# Patient Record
Sex: Female | Born: 1945
Health system: Southern US, Community
[De-identification: ages and names within clinical notes are randomized; demographics above are authoritative.]

## PROBLEM LIST (undated history)

## (undated) DIAGNOSIS — M810 Age-related osteoporosis without current pathological fracture: Secondary | ICD-10-CM

## (undated) DIAGNOSIS — R413 Other amnesia: Secondary | ICD-10-CM

## (undated) DIAGNOSIS — M8580 Other specified disorders of bone density and structure, unspecified site: Secondary | ICD-10-CM

## (undated) DIAGNOSIS — E039 Hypothyroidism, unspecified: Secondary | ICD-10-CM

## (undated) DIAGNOSIS — H10409 Unspecified chronic conjunctivitis, unspecified eye: Secondary | ICD-10-CM

## (undated) DIAGNOSIS — E785 Hyperlipidemia, unspecified: Secondary | ICD-10-CM

## (undated) DIAGNOSIS — G500 Trigeminal neuralgia: Secondary | ICD-10-CM

## (undated) DIAGNOSIS — K222 Esophageal obstruction: Secondary | ICD-10-CM

## (undated) DIAGNOSIS — L814 Other melanin hyperpigmentation: Secondary | ICD-10-CM

## (undated) DIAGNOSIS — K5730 Diverticulosis of large intestine without perforation or abscess without bleeding: Secondary | ICD-10-CM

## (undated) DIAGNOSIS — C439 Malignant melanoma of skin, unspecified: Secondary | ICD-10-CM

## (undated) DIAGNOSIS — M47812 Spondylosis without myelopathy or radiculopathy, cervical region: Secondary | ICD-10-CM

## (undated) DIAGNOSIS — H269 Unspecified cataract: Secondary | ICD-10-CM

## (undated) DIAGNOSIS — F419 Anxiety disorder, unspecified: Secondary | ICD-10-CM

## (undated) DIAGNOSIS — K219 Gastro-esophageal reflux disease without esophagitis: Secondary | ICD-10-CM

## (undated) DIAGNOSIS — H9190 Unspecified hearing loss, unspecified ear: Secondary | ICD-10-CM

## (undated) DIAGNOSIS — E7800 Pure hypercholesterolemia, unspecified: Secondary | ICD-10-CM

## (undated) DIAGNOSIS — Z8619 Personal history of other infectious and parasitic diseases: Secondary | ICD-10-CM

## (undated) HISTORY — DX: Trigeminal neuralgia: G50.0

## (undated) HISTORY — DX: Spondylosis without myelopathy or radiculopathy, cervical region: M47.812

## (undated) HISTORY — PX: UPPER GASTROINTESTINAL ENDOSCOPY: SHX188

## (undated) HISTORY — DX: Unspecified chronic conjunctivitis, unspecified eye: H10.409

## (undated) HISTORY — DX: Personal history of other infectious and parasitic diseases: Z86.19

## (undated) HISTORY — DX: Age-related osteoporosis without current pathological fracture: M81.0

## (undated) HISTORY — DX: Esophageal obstruction: K22.2

## (undated) HISTORY — DX: Hypothyroidism, unspecified: E03.9

## (undated) HISTORY — PX: COLONOSCOPY: SHX174

## (undated) HISTORY — DX: Other specified disorders of bone density and structure, unspecified site: M85.80

## (undated) HISTORY — DX: Unspecified hearing loss, unspecified ear: H91.90

## (undated) HISTORY — DX: Hyperlipidemia, unspecified: E78.5

## (undated) HISTORY — PX: CATARACT EXTRACTION: SUR2

## (undated) HISTORY — DX: Pure hypercholesterolemia, unspecified: E78.00

## (undated) HISTORY — PX: CHOLECYSTECTOMY: SHX55

## (undated) HISTORY — DX: Unspecified cataract: H26.9

## (undated) HISTORY — PX: ABDOMINAL HYSTERECTOMY: SHX81

## (undated) HISTORY — DX: Other amnesia: R41.3

## (undated) HISTORY — DX: Gastro-esophageal reflux disease without esophagitis: K21.9

## (undated) HISTORY — DX: Malignant melanoma of skin, unspecified: C43.9

## (undated) HISTORY — DX: Anxiety disorder, unspecified: F41.9

## (undated) HISTORY — PX: OTHER SURGICAL HISTORY: SHX169

## (undated) HISTORY — PX: WRIST SURGERY: SHX841

## (undated) HISTORY — DX: Other melanin hyperpigmentation: L81.4

## (undated) HISTORY — PX: COLON SURGERY: SHX602

## (undated) HISTORY — PX: LUMBAR DISC SURGERY: SHX700

## (undated) HISTORY — DX: Diverticulosis of large intestine without perforation or abscess without bleeding: K57.30

## (undated) HISTORY — PX: SHOULDER SURGERY: SHX246

---

## 1986-01-19 HISTORY — PX: BREAST EXCISIONAL BIOPSY: SUR124

## 1986-01-19 HISTORY — PX: BREAST BIOPSY: SHX20

## 1997-01-19 HISTORY — PX: COLON RESECTION: SHX5231

## 2002-10-05 ENCOUNTER — Other Ambulatory Visit: Admission: RE | Admit: 2002-10-05 | Discharge: 2002-10-05 | Payer: Self-pay | Admitting: Obstetrics and Gynecology

## 2002-10-19 ENCOUNTER — Ambulatory Visit (HOSPITAL_COMMUNITY): Admission: RE | Admit: 2002-10-19 | Discharge: 2002-10-19 | Payer: Self-pay | Admitting: Family Medicine

## 2002-10-19 ENCOUNTER — Encounter: Payer: Self-pay | Admitting: Family Medicine

## 2003-04-14 ENCOUNTER — Emergency Department (HOSPITAL_COMMUNITY): Admission: EM | Admit: 2003-04-14 | Discharge: 2003-04-14 | Payer: Self-pay | Admitting: *Deleted

## 2003-04-17 ENCOUNTER — Ambulatory Visit (HOSPITAL_COMMUNITY): Admission: RE | Admit: 2003-04-17 | Discharge: 2003-04-17 | Payer: Self-pay | Admitting: Family Medicine

## 2003-04-23 ENCOUNTER — Ambulatory Visit (HOSPITAL_COMMUNITY): Admission: RE | Admit: 2003-04-23 | Discharge: 2003-04-23 | Payer: Self-pay | Admitting: Orthopaedic Surgery

## 2003-04-26 ENCOUNTER — Emergency Department (HOSPITAL_COMMUNITY): Admission: EM | Admit: 2003-04-26 | Discharge: 2003-04-26 | Payer: Self-pay | Admitting: Emergency Medicine

## 2003-05-10 ENCOUNTER — Ambulatory Visit (HOSPITAL_COMMUNITY): Admission: RE | Admit: 2003-05-10 | Discharge: 2003-05-10 | Payer: Self-pay | Admitting: Gastroenterology

## 2003-05-16 ENCOUNTER — Ambulatory Visit (HOSPITAL_COMMUNITY): Admission: RE | Admit: 2003-05-16 | Discharge: 2003-05-16 | Payer: Self-pay | Admitting: Gastroenterology

## 2003-05-18 ENCOUNTER — Ambulatory Visit (HOSPITAL_COMMUNITY): Admission: RE | Admit: 2003-05-18 | Discharge: 2003-05-18 | Payer: Self-pay | Admitting: Gastroenterology

## 2003-06-07 ENCOUNTER — Ambulatory Visit (HOSPITAL_COMMUNITY): Admission: RE | Admit: 2003-06-07 | Discharge: 2003-06-07 | Payer: Self-pay | Admitting: Orthopaedic Surgery

## 2003-06-13 ENCOUNTER — Ambulatory Visit (HOSPITAL_COMMUNITY): Admission: RE | Admit: 2003-06-13 | Discharge: 2003-06-13 | Payer: Self-pay | Admitting: Orthopaedic Surgery

## 2003-07-18 ENCOUNTER — Ambulatory Visit (HOSPITAL_COMMUNITY): Admission: RE | Admit: 2003-07-18 | Discharge: 2003-07-18 | Payer: Self-pay | Admitting: Orthopaedic Surgery

## 2003-07-24 ENCOUNTER — Inpatient Hospital Stay (HOSPITAL_COMMUNITY): Admission: RE | Admit: 2003-07-24 | Discharge: 2003-07-27 | Payer: Self-pay | Admitting: Orthopaedic Surgery

## 2003-12-12 ENCOUNTER — Other Ambulatory Visit: Admission: RE | Admit: 2003-12-12 | Discharge: 2003-12-12 | Payer: Self-pay | Admitting: Obstetrics and Gynecology

## 2003-12-21 ENCOUNTER — Ambulatory Visit: Payer: Self-pay | Admitting: Family Medicine

## 2004-01-18 ENCOUNTER — Ambulatory Visit (HOSPITAL_COMMUNITY): Admission: RE | Admit: 2004-01-18 | Discharge: 2004-01-18 | Payer: Self-pay | Admitting: Obstetrics and Gynecology

## 2004-02-12 ENCOUNTER — Ambulatory Visit: Payer: Self-pay | Admitting: Family Medicine

## 2004-02-12 ENCOUNTER — Ambulatory Visit (HOSPITAL_COMMUNITY): Admission: RE | Admit: 2004-02-12 | Discharge: 2004-02-12 | Payer: Self-pay | Admitting: Family Medicine

## 2004-03-31 ENCOUNTER — Ambulatory Visit: Payer: Self-pay | Admitting: Family Medicine

## 2004-09-23 ENCOUNTER — Ambulatory Visit: Payer: Self-pay | Admitting: Internal Medicine

## 2005-02-05 ENCOUNTER — Ambulatory Visit: Payer: Self-pay | Admitting: Gastroenterology

## 2005-02-16 ENCOUNTER — Ambulatory Visit (HOSPITAL_COMMUNITY): Admission: RE | Admit: 2005-02-16 | Discharge: 2005-02-16 | Payer: Self-pay | Admitting: Gastroenterology

## 2005-02-16 ENCOUNTER — Ambulatory Visit: Payer: Self-pay | Admitting: Gastroenterology

## 2005-02-16 DIAGNOSIS — K222 Esophageal obstruction: Secondary | ICD-10-CM | POA: Insufficient documentation

## 2005-02-20 ENCOUNTER — Other Ambulatory Visit: Admission: RE | Admit: 2005-02-20 | Discharge: 2005-02-20 | Payer: Self-pay | Admitting: Obstetrics and Gynecology

## 2005-03-09 ENCOUNTER — Ambulatory Visit (HOSPITAL_COMMUNITY): Admission: RE | Admit: 2005-03-09 | Discharge: 2005-03-09 | Payer: Self-pay | Admitting: Obstetrics and Gynecology

## 2005-04-08 ENCOUNTER — Ambulatory Visit: Payer: Self-pay | Admitting: Gastroenterology

## 2005-04-13 ENCOUNTER — Ambulatory Visit: Payer: Self-pay | Admitting: Internal Medicine

## 2005-04-14 ENCOUNTER — Ambulatory Visit: Payer: Self-pay | Admitting: Internal Medicine

## 2005-04-17 ENCOUNTER — Ambulatory Visit (HOSPITAL_COMMUNITY): Admission: RE | Admit: 2005-04-17 | Discharge: 2005-04-17 | Payer: Self-pay | Admitting: Internal Medicine

## 2005-04-17 ENCOUNTER — Ambulatory Visit: Payer: Self-pay | Admitting: Internal Medicine

## 2005-04-28 ENCOUNTER — Encounter: Payer: Self-pay | Admitting: Internal Medicine

## 2005-04-28 ENCOUNTER — Ambulatory Visit: Payer: Self-pay

## 2005-05-13 ENCOUNTER — Ambulatory Visit: Payer: Self-pay | Admitting: Internal Medicine

## 2005-05-28 ENCOUNTER — Ambulatory Visit: Payer: Self-pay | Admitting: Gastroenterology

## 2005-06-01 ENCOUNTER — Ambulatory Visit (HOSPITAL_COMMUNITY): Admission: RE | Admit: 2005-06-01 | Discharge: 2005-06-01 | Payer: Self-pay | Admitting: Gastroenterology

## 2005-07-07 ENCOUNTER — Ambulatory Visit: Payer: Self-pay | Admitting: Gastroenterology

## 2005-07-13 ENCOUNTER — Ambulatory Visit: Payer: Self-pay | Admitting: Internal Medicine

## 2005-09-24 ENCOUNTER — Ambulatory Visit: Payer: Self-pay | Admitting: Endocrinology

## 2005-11-10 ENCOUNTER — Ambulatory Visit: Payer: Self-pay | Admitting: Internal Medicine

## 2005-11-11 ENCOUNTER — Ambulatory Visit: Payer: Self-pay | Admitting: Internal Medicine

## 2005-11-11 LAB — CONVERTED CEMR LAB
ALT: 15 units/L (ref 0–40)
AST: 16 units/L (ref 0–37)
BUN: 26 mg/dL — ABNORMAL HIGH (ref 6–23)
CO2: 31 meq/L (ref 19–32)
Calcium: 9.6 mg/dL (ref 8.4–10.5)
Chloride: 107 meq/L (ref 96–112)
Chol/HDL Ratio, serum: 2.7
Cholesterol: 159 mg/dL (ref 0–200)
Creatinine, Ser: 1 mg/dL (ref 0.4–1.2)
Creatinine,U: 75.1 mg/dL
GFR calc non Af Amer: 60 mL/min
Glomerular Filtration Rate, Af Am: 73 mL/min/{1.73_m2}
Glucose, Bld: 126 mg/dL — ABNORMAL HIGH (ref 70–99)
HCT: 43.1 % (ref 36.0–46.0)
HDL: 59.3 mg/dL (ref >39.0)
Hemoglobin: 13.8 g/dL (ref 12.0–15.0)
Hgb A1c MFr Bld: 6.3 % — ABNORMAL HIGH (ref 4.6–6.0)
LDL Cholesterol: 92 mg/dL (ref 0–99)
MCHC: 32.1 g/dL (ref 30.0–36.0)
MCV: 95.2 fL (ref 78.0–100.0)
Microalb Creat Ratio: 2.7 mg/g (ref 0.0–30.0)
Microalb, Ur: 0.2 mg/dL (ref 0.0–1.9)
Platelets: 201 10*3/uL (ref 150–400)
Potassium: 5 meq/L (ref 3.5–5.1)
RBC: 4.52 M/uL (ref 3.87–5.11)
RDW: 12.4 % (ref 11.5–14.6)
Sodium: 141 meq/L (ref 135–145)
Triglyceride fasting, serum: 37 mg/dL (ref 0–149)
VLDL: 7 mg/dL (ref 0–40)
WBC: 5.1 10*3/uL (ref 4.5–10.5)

## 2005-11-17 ENCOUNTER — Ambulatory Visit (HOSPITAL_COMMUNITY): Admission: RE | Admit: 2005-11-17 | Discharge: 2005-11-17 | Payer: Self-pay | Admitting: Internal Medicine

## 2005-11-30 ENCOUNTER — Ambulatory Visit: Payer: Self-pay | Admitting: Internal Medicine

## 2006-01-08 ENCOUNTER — Ambulatory Visit: Payer: Self-pay | Admitting: Internal Medicine

## 2006-01-29 ENCOUNTER — Ambulatory Visit: Payer: Self-pay | Admitting: Internal Medicine

## 2006-01-29 LAB — CONVERTED CEMR LAB
BUN: 22 mg/dL (ref 6–23)
Creatinine, Ser: 0.8 mg/dL (ref 0.4–1.2)

## 2006-02-04 ENCOUNTER — Ambulatory Visit (HOSPITAL_COMMUNITY): Admission: RE | Admit: 2006-02-04 | Discharge: 2006-02-04 | Payer: Self-pay | Admitting: Internal Medicine

## 2006-02-27 ENCOUNTER — Emergency Department (HOSPITAL_COMMUNITY): Admission: EM | Admit: 2006-02-27 | Discharge: 2006-02-27 | Payer: Self-pay | Admitting: Emergency Medicine

## 2006-03-31 ENCOUNTER — Ambulatory Visit: Payer: Self-pay | Admitting: Internal Medicine

## 2006-03-31 LAB — CONVERTED CEMR LAB
ALT: 15 units/L (ref 0–40)
AST: 18 units/L (ref 0–37)
Albumin: 3.7 g/dL (ref 3.5–5.2)
Alkaline Phosphatase: 73 units/L (ref 39–117)
Amylase: 56 units/L (ref 27–131)
Basophils Absolute: 0 10*3/uL (ref 0.0–0.1)
Basophils Relative: 0.2 % (ref 0.0–1.0)
Bilirubin, Direct: 0.1 mg/dL (ref 0.0–0.3)
Eosinophils Absolute: 0 10*3/uL (ref 0.0–0.6)
Eosinophils Relative: 0.6 % (ref 0.0–5.0)
HCT: 41.5 % (ref 36.0–46.0)
Hemoglobin: 14.5 g/dL (ref 12.0–15.0)
Hgb A1c MFr Bld: 6.2 % — ABNORMAL HIGH (ref 4.6–6.0)
Lipase: 31 units/L (ref 11.0–59.0)
Lymphocytes Relative: 29.7 % (ref 12.0–46.0)
MCHC: 34.8 g/dL (ref 30.0–36.0)
MCV: 92.3 fL (ref 78.0–100.0)
Monocytes Absolute: 0.5 10*3/uL (ref 0.2–0.7)
Monocytes Relative: 10.8 % (ref 3.0–11.0)
Neutro Abs: 2.9 10*3/uL (ref 1.4–7.7)
Neutrophils Relative %: 58.7 % (ref 43.0–77.0)
Platelets: 203 10*3/uL (ref 150–400)
RBC: 4.49 M/uL (ref 3.87–5.11)
RDW: 11.8 % (ref 11.5–14.6)
Total Bilirubin: 0.7 mg/dL (ref 0.3–1.2)
Total Protein: 7.1 g/dL (ref 6.0–8.3)
WBC: 4.9 10*3/uL (ref 4.5–10.5)

## 2006-04-19 ENCOUNTER — Ambulatory Visit: Payer: Self-pay | Admitting: Gastroenterology

## 2006-04-22 ENCOUNTER — Ambulatory Visit (HOSPITAL_COMMUNITY): Admission: RE | Admit: 2006-04-22 | Discharge: 2006-04-22 | Payer: Self-pay | Admitting: Obstetrics and Gynecology

## 2006-04-27 ENCOUNTER — Ambulatory Visit (HOSPITAL_COMMUNITY): Admission: RE | Admit: 2006-04-27 | Discharge: 2006-04-27 | Payer: Self-pay | Admitting: Gastroenterology

## 2006-05-10 DIAGNOSIS — Z85820 Personal history of malignant melanoma of skin: Secondary | ICD-10-CM | POA: Insufficient documentation

## 2006-05-10 DIAGNOSIS — M8580 Other specified disorders of bone density and structure, unspecified site: Secondary | ICD-10-CM | POA: Insufficient documentation

## 2006-05-10 DIAGNOSIS — C439 Malignant melanoma of skin, unspecified: Secondary | ICD-10-CM | POA: Insufficient documentation

## 2006-05-10 DIAGNOSIS — E785 Hyperlipidemia, unspecified: Secondary | ICD-10-CM | POA: Insufficient documentation

## 2006-05-10 DIAGNOSIS — K5732 Diverticulitis of large intestine without perforation or abscess without bleeding: Secondary | ICD-10-CM | POA: Insufficient documentation

## 2006-05-10 DIAGNOSIS — E1169 Type 2 diabetes mellitus with other specified complication: Secondary | ICD-10-CM | POA: Insufficient documentation

## 2006-06-01 ENCOUNTER — Ambulatory Visit: Payer: Self-pay | Admitting: Gastroenterology

## 2006-06-11 ENCOUNTER — Ambulatory Visit: Payer: Self-pay | Admitting: Gastroenterology

## 2006-06-11 LAB — CONVERTED CEMR LAB
ALT: 16 units/L (ref 0–40)
AST: 18 units/L (ref 0–37)
Albumin: 4.1 g/dL (ref 3.5–5.2)
Alkaline Phosphatase: 84 units/L (ref 39–117)
Ammonia: 7 umol/L — ABNORMAL LOW (ref 11–35)
Basophils Absolute: 0 10*3/uL (ref 0.0–0.1)
Basophils Relative: 0.4 % (ref 0.0–1.0)
Bilirubin, Direct: 0.1 mg/dL (ref 0.0–0.3)
Cholesterol: 241 mg/dL (ref 0–200)
Direct LDL: 148.1 mg/dL
Eosinophils Absolute: 0 10*3/uL (ref 0.0–0.6)
Eosinophils Relative: 0.5 % (ref 0.0–5.0)
HCT: 41.6 % (ref 36.0–46.0)
HDL: 63 mg/dL (ref >39.0)
Hemoglobin: 14.1 g/dL (ref 12.0–15.0)
Lymphocytes Relative: 22 % (ref 12.0–46.0)
MCHC: 33.9 g/dL (ref 30.0–36.0)
MCV: 90.9 fL (ref 78.0–100.0)
Monocytes Absolute: 0.7 10*3/uL (ref 0.2–0.7)
Monocytes Relative: 9.8 % (ref 3.0–11.0)
Neutro Abs: 4.8 10*3/uL (ref 1.4–7.7)
Neutrophils Relative %: 67.3 % (ref 43.0–77.0)
Platelets: 255 10*3/uL (ref 150–400)
RBC: 4.57 M/uL (ref 3.87–5.11)
RDW: 11.2 % — ABNORMAL LOW (ref 11.5–14.6)
Total Bilirubin: 0.7 mg/dL (ref 0.3–1.2)
Total CHOL/HDL Ratio: 3.8
Total Protein: 7.6 g/dL (ref 6.0–8.3)
Triglycerides: 113 mg/dL (ref 0–149)
VLDL: 23 mg/dL (ref 0–40)
WBC: 7 10*3/uL (ref 4.5–10.5)

## 2006-06-24 ENCOUNTER — Ambulatory Visit: Payer: Self-pay | Admitting: Gastroenterology

## 2006-10-23 ENCOUNTER — Encounter: Payer: Self-pay | Admitting: *Deleted

## 2006-10-23 DIAGNOSIS — K219 Gastro-esophageal reflux disease without esophagitis: Secondary | ICD-10-CM | POA: Insufficient documentation

## 2006-11-08 ENCOUNTER — Ambulatory Visit: Payer: Self-pay | Admitting: Gastroenterology

## 2006-11-08 LAB — CONVERTED CEMR LAB
BUN: 26 mg/dL — ABNORMAL HIGH (ref 6–23)
Creatinine, Ser: 0.9 mg/dL (ref 0.4–1.2)

## 2006-11-12 ENCOUNTER — Ambulatory Visit (HOSPITAL_COMMUNITY): Admission: RE | Admit: 2006-11-12 | Discharge: 2006-11-12 | Payer: Self-pay | Admitting: Gastroenterology

## 2007-02-14 ENCOUNTER — Ambulatory Visit: Payer: Self-pay | Admitting: Internal Medicine

## 2007-02-14 DIAGNOSIS — F411 Generalized anxiety disorder: Secondary | ICD-10-CM | POA: Insufficient documentation

## 2007-02-14 DIAGNOSIS — E1142 Type 2 diabetes mellitus with diabetic polyneuropathy: Secondary | ICD-10-CM | POA: Insufficient documentation

## 2007-02-15 LAB — CONVERTED CEMR LAB
ALT: 20 units/L (ref 0–35)
AST: 20 units/L (ref 0–37)
BUN: 21 mg/dL (ref 6–23)
CO2: 31 meq/L (ref 19–32)
Calcium: 9.7 mg/dL (ref 8.4–10.5)
Chloride: 104 meq/L (ref 96–112)
Cholesterol: 174 mg/dL (ref 0–200)
Creatinine, Ser: 0.8 mg/dL (ref 0.4–1.2)
GFR calc Af Amer: 94 mL/min
GFR calc non Af Amer: 78 mL/min
Glucose, Bld: 130 mg/dL — ABNORMAL HIGH (ref 70–99)
HDL: 80.1 mg/dL (ref >39.0)
Hgb A1c MFr Bld: 6.1 % — ABNORMAL HIGH (ref 4.6–6.0)
LDL Cholesterol: 83 mg/dL (ref 0–99)
Potassium: 4.8 meq/L (ref 3.5–5.1)
Sodium: 142 meq/L (ref 135–145)
Total CHOL/HDL Ratio: 2.2
Triglycerides: 54 mg/dL (ref 0–149)
VLDL: 11 mg/dL (ref 0–40)

## 2007-03-18 ENCOUNTER — Telehealth (INDEPENDENT_AMBULATORY_CARE_PROVIDER_SITE_OTHER): Payer: Self-pay | Admitting: *Deleted

## 2007-03-18 ENCOUNTER — Ambulatory Visit: Payer: Self-pay | Admitting: Internal Medicine

## 2007-03-18 LAB — CONVERTED CEMR LAB
Bacteria, UA: NONE SEEN
Bilirubin Urine: NEGATIVE
Glucose, Urine, Semiquant: NEGATIVE
Ketones, urine, test strip: NEGATIVE
Nitrite: NEGATIVE
Protein, U semiquant: NEGATIVE
RBC / HPF: NONE SEEN (ref <3)
Specific Gravity, Urine: 1.01
Urobilinogen, UA: NEGATIVE
WBC, UA: NONE SEEN cells/hpf (ref <3)
pH: 6

## 2007-03-19 ENCOUNTER — Encounter: Payer: Self-pay | Admitting: Internal Medicine

## 2007-04-08 ENCOUNTER — Telehealth (INDEPENDENT_AMBULATORY_CARE_PROVIDER_SITE_OTHER): Payer: Self-pay | Admitting: *Deleted

## 2007-04-08 ENCOUNTER — Emergency Department (HOSPITAL_COMMUNITY): Admission: EM | Admit: 2007-04-08 | Discharge: 2007-04-08 | Payer: Self-pay | Admitting: Emergency Medicine

## 2007-04-11 ENCOUNTER — Encounter: Payer: Self-pay | Admitting: Internal Medicine

## 2007-04-25 ENCOUNTER — Encounter: Payer: Self-pay | Admitting: Internal Medicine

## 2007-05-06 ENCOUNTER — Ambulatory Visit: Payer: Self-pay | Admitting: Gastroenterology

## 2007-05-10 ENCOUNTER — Ambulatory Visit: Payer: Self-pay | Admitting: Gastroenterology

## 2007-05-12 ENCOUNTER — Encounter: Payer: Self-pay | Admitting: Internal Medicine

## 2007-05-12 ENCOUNTER — Ambulatory Visit: Payer: Self-pay | Admitting: Gastroenterology

## 2007-05-12 LAB — CONVERTED CEMR LAB
BUN: 16 mg/dL (ref 6–23)
Creatinine, Ser: 0.8 mg/dL (ref 0.4–1.2)

## 2007-05-16 ENCOUNTER — Ambulatory Visit: Payer: Self-pay | Admitting: Cardiovascular Disease

## 2007-05-16 ENCOUNTER — Encounter: Payer: Self-pay | Admitting: Gastroenterology

## 2007-05-20 ENCOUNTER — Telehealth: Payer: Self-pay | Admitting: Gastroenterology

## 2007-05-25 ENCOUNTER — Encounter: Payer: Self-pay | Admitting: Internal Medicine

## 2007-05-30 ENCOUNTER — Encounter: Admission: RE | Admit: 2007-05-30 | Discharge: 2007-05-30 | Payer: Self-pay | Admitting: Orthopaedic Surgery

## 2007-06-14 ENCOUNTER — Ambulatory Visit: Payer: Self-pay | Admitting: Gastroenterology

## 2007-06-22 ENCOUNTER — Encounter: Payer: Self-pay | Admitting: Internal Medicine

## 2007-06-28 ENCOUNTER — Ambulatory Visit (HOSPITAL_COMMUNITY): Admission: RE | Admit: 2007-06-28 | Discharge: 2007-06-28 | Payer: Self-pay | Admitting: Obstetrics and Gynecology

## 2007-07-08 ENCOUNTER — Ambulatory Visit: Payer: Self-pay | Admitting: Internal Medicine

## 2007-07-08 DIAGNOSIS — R634 Abnormal weight loss: Secondary | ICD-10-CM | POA: Insufficient documentation

## 2007-07-11 ENCOUNTER — Ambulatory Visit: Payer: Self-pay | Admitting: Internal Medicine

## 2007-07-11 ENCOUNTER — Telehealth (INDEPENDENT_AMBULATORY_CARE_PROVIDER_SITE_OTHER): Payer: Self-pay | Admitting: *Deleted

## 2007-07-11 LAB — CONVERTED CEMR LAB
ALT: 13 units/L (ref 0–35)
AST: 15 units/L (ref 0–37)
Albumin: 4.6 g/dL (ref 3.5–5.2)
Alkaline Phosphatase: 74 units/L (ref 39–117)
Basophils Absolute: 0 10*3/uL (ref 0.0–0.1)
Basophils Relative: 0 % (ref 0–1)
Bilirubin, Direct: 0.1 mg/dL (ref 0.0–0.3)
Eosinophils Absolute: 0 10*3/uL (ref 0.0–0.7)
Eosinophils Relative: 1 % (ref 0–5)
HCT: 41.1 % (ref 36.0–46.0)
Hemoglobin: 13.6 g/dL (ref 12.0–15.0)
Indirect Bilirubin: 0.5 mg/dL (ref 0.0–0.9)
Lymphocytes Relative: 26 % (ref 12–46)
Lymphs Abs: 1.5 10*3/uL (ref 0.7–4.0)
MCHC: 33.1 g/dL (ref 30.0–36.0)
MCV: 91.9 fL (ref 78.0–100.0)
Monocytes Absolute: 0.5 10*3/uL (ref 0.1–1.0)
Monocytes Relative: 9 % (ref 3–12)
Neutro Abs: 3.7 10*3/uL (ref 1.7–7.7)
Neutrophils Relative %: 65 % (ref 43–77)
Platelets: 198 10*3/uL (ref 150–400)
RBC: 4.47 M/uL (ref 3.87–5.11)
RDW: 13.4 % (ref 11.5–15.5)
Total Bilirubin: 0.6 mg/dL (ref 0.3–1.2)
Total Protein: 7.3 g/dL (ref 6.0–8.3)
WBC: 5.8 10*3/uL (ref 4.0–10.5)

## 2007-10-25 ENCOUNTER — Encounter: Payer: Self-pay | Admitting: Internal Medicine

## 2007-10-25 ENCOUNTER — Ambulatory Visit: Payer: Self-pay | Admitting: Gastroenterology

## 2007-10-25 LAB — CONVERTED CEMR LAB
HCT: 42 %
Hemoglobin: 14.3 g/dL
Hgb A1c MFr Bld: 6.2 %
MCH: 31.4 pg
MCV: 92.1 fL
Platelets: 173 10*3/uL
RBC count: 4.55 10*6/uL
TSH: 5.22 microintl units/mL
WBC, blood: 4.6 10*3/uL

## 2007-10-26 ENCOUNTER — Ambulatory Visit: Payer: Self-pay | Admitting: Gastroenterology

## 2007-10-26 ENCOUNTER — Telehealth: Payer: Self-pay | Admitting: Gastroenterology

## 2007-10-26 LAB — CONVERTED CEMR LAB
ALT: 19 units/L (ref 0–35)
AST: 19 units/L (ref 0–37)
Albumin: 4.3 g/dL (ref 3.5–5.2)
Alkaline Phosphatase: 80 units/L (ref 39–117)
Amylase: 79 units/L (ref 27–131)
BUN: 22 mg/dL (ref 6–23)
Basophils Absolute: 0 10*3/uL (ref 0.0–0.1)
Basophils Relative: 0.1 % (ref 0.0–3.0)
Bilirubin, Direct: 0.1 mg/dL (ref 0.0–0.3)
Creatinine, Ser: 1 mg/dL (ref 0.4–1.2)
Eosinophils Absolute: 0 10*3/uL (ref 0.0–0.7)
Eosinophils Relative: 0.6 % (ref 0.0–5.0)
HCT: 40.4 % (ref 36.0–46.0)
Hemoglobin: 14.1 g/dL (ref 12.0–15.0)
Lipase: 30 units/L (ref 11.0–59.0)
Lymphocytes Relative: 26.2 % (ref 12.0–46.0)
MCHC: 35 g/dL (ref 30.0–36.0)
MCV: 91.7 fL (ref 78.0–100.0)
Monocytes Absolute: 0.6 10*3/uL (ref 0.1–1.0)
Monocytes Relative: 11.2 % (ref 3.0–12.0)
Neutro Abs: 3.3 10*3/uL (ref 1.4–7.7)
Neutrophils Relative %: 61.9 % (ref 43.0–77.0)
Platelets: 190 10*3/uL (ref 150–400)
RBC: 4.4 M/uL (ref 3.87–5.11)
RDW: 11.7 % (ref 11.5–14.6)
Total Bilirubin: 0.7 mg/dL (ref 0.3–1.2)
Total Protein: 7.4 g/dL (ref 6.0–8.3)
WBC: 5.3 10*3/uL (ref 4.5–10.5)

## 2007-10-27 ENCOUNTER — Ambulatory Visit: Payer: Self-pay | Admitting: Cardiology

## 2007-10-31 ENCOUNTER — Encounter: Payer: Self-pay | Admitting: Gastroenterology

## 2007-10-31 ENCOUNTER — Telehealth: Payer: Self-pay | Admitting: Gastroenterology

## 2007-11-04 ENCOUNTER — Encounter: Payer: Self-pay | Admitting: Internal Medicine

## 2007-11-07 ENCOUNTER — Ambulatory Visit: Payer: Self-pay | Admitting: Internal Medicine

## 2007-11-07 DIAGNOSIS — E039 Hypothyroidism, unspecified: Secondary | ICD-10-CM | POA: Insufficient documentation

## 2007-11-28 ENCOUNTER — Telehealth: Payer: Self-pay | Admitting: Gastroenterology

## 2007-11-29 ENCOUNTER — Ambulatory Visit: Payer: Self-pay | Admitting: Gastroenterology

## 2007-12-01 ENCOUNTER — Ambulatory Visit: Payer: Self-pay | Admitting: Gastroenterology

## 2007-12-01 LAB — HM COLONOSCOPY

## 2007-12-05 ENCOUNTER — Ambulatory Visit: Payer: Self-pay | Admitting: Internal Medicine

## 2007-12-13 ENCOUNTER — Telehealth: Payer: Self-pay | Admitting: Gastroenterology

## 2007-12-26 ENCOUNTER — Encounter: Payer: Self-pay | Admitting: Internal Medicine

## 2008-01-09 ENCOUNTER — Ambulatory Visit: Payer: Self-pay | Admitting: Gastroenterology

## 2008-01-10 ENCOUNTER — Ambulatory Visit: Payer: Self-pay | Admitting: Family Medicine

## 2008-01-10 LAB — CONVERTED CEMR LAB
Bilirubin Urine: NEGATIVE
Blood in Urine, dipstick: NEGATIVE
Glucose, Urine, Semiquant: NEGATIVE
Inflenza A Ag: NEGATIVE
Ketones, urine, test strip: NEGATIVE
Nitrite: NEGATIVE
Rapid Strep: NEGATIVE
Specific Gravity, Urine: 1.01
Urobilinogen, UA: 0.2
pH: 6.5

## 2008-01-11 ENCOUNTER — Encounter: Payer: Self-pay | Admitting: Internal Medicine

## 2008-01-16 ENCOUNTER — Ambulatory Visit: Payer: Self-pay | Admitting: Internal Medicine

## 2008-01-17 ENCOUNTER — Telehealth (INDEPENDENT_AMBULATORY_CARE_PROVIDER_SITE_OTHER): Payer: Self-pay | Admitting: *Deleted

## 2008-02-29 ENCOUNTER — Encounter (INDEPENDENT_AMBULATORY_CARE_PROVIDER_SITE_OTHER): Payer: Self-pay | Admitting: *Deleted

## 2008-03-07 ENCOUNTER — Ambulatory Visit: Payer: Self-pay | Admitting: Internal Medicine

## 2008-03-07 LAB — CONVERTED CEMR LAB
Bilirubin Urine: NEGATIVE
Glucose, Urine, Semiquant: NEGATIVE
Ketones, urine, test strip: NEGATIVE
Nitrite: NEGATIVE
Protein, U semiquant: NEGATIVE
Specific Gravity, Urine: 1.015
Urobilinogen, UA: 0.2
WBC Urine, dipstick: NEGATIVE
pH: 5

## 2008-03-08 ENCOUNTER — Encounter: Payer: Self-pay | Admitting: Internal Medicine

## 2008-03-08 LAB — CONVERTED CEMR LAB
RBC / HPF: NONE SEEN (ref <3)
WBC, UA: NONE SEEN cells/hpf (ref <3)

## 2008-03-12 ENCOUNTER — Telehealth (INDEPENDENT_AMBULATORY_CARE_PROVIDER_SITE_OTHER): Payer: Self-pay | Admitting: *Deleted

## 2008-03-12 LAB — CONVERTED CEMR LAB
ALT: 20 units/L (ref 0–35)
AST: 24 units/L (ref 0–37)
BUN: 25 mg/dL — ABNORMAL HIGH (ref 6–23)
CO2: 31 meq/L (ref 19–32)
Calcium: 9.7 mg/dL (ref 8.4–10.5)
Chloride: 104 meq/L (ref 96–112)
Cholesterol: 184 mg/dL (ref 0–200)
Creatinine, Ser: 0.9 mg/dL (ref 0.4–1.2)
GFR calc Af Amer: 82 mL/min
GFR calc non Af Amer: 67 mL/min
Glucose, Bld: 108 mg/dL — ABNORMAL HIGH (ref 70–99)
HDL: 88.2 mg/dL (ref >39.0)
LDL Cholesterol: 85 mg/dL (ref 0–99)
Potassium: 5 meq/L (ref 3.5–5.1)
Sodium: 142 meq/L (ref 135–145)
Total CHOL/HDL Ratio: 2.1
Triglycerides: 56 mg/dL (ref 0–149)
VLDL: 11 mg/dL (ref 0–40)
Vit D, 25-Hydroxy: 31 ng/mL (ref 30–89)

## 2008-04-12 ENCOUNTER — Ambulatory Visit: Payer: Self-pay | Admitting: Gastroenterology

## 2008-06-28 ENCOUNTER — Ambulatory Visit (HOSPITAL_COMMUNITY): Admission: RE | Admit: 2008-06-28 | Discharge: 2008-06-28 | Payer: Self-pay | Admitting: Obstetrics and Gynecology

## 2008-09-04 ENCOUNTER — Ambulatory Visit: Payer: Self-pay | Admitting: Internal Medicine

## 2008-09-04 LAB — HM DIABETES FOOT EXAM

## 2008-09-04 LAB — CONVERTED CEMR LAB: Vit D, 25-Hydroxy: 23 ng/mL — ABNORMAL LOW (ref 30–89)

## 2008-09-07 ENCOUNTER — Encounter (INDEPENDENT_AMBULATORY_CARE_PROVIDER_SITE_OTHER): Payer: Self-pay | Admitting: *Deleted

## 2008-09-07 LAB — CONVERTED CEMR LAB
Creatinine,U: 42.8 mg/dL
Hgb A1c MFr Bld: 6.4 % (ref 4.6–6.5)
Microalb Creat Ratio: 9.3 mg/g (ref 0.0–30.0)
Microalb, Ur: 0.4 mg/dL (ref 0.0–1.9)
TSH: 2.64 microintl units/mL (ref 0.35–5.50)

## 2008-12-05 ENCOUNTER — Ambulatory Visit: Payer: Self-pay | Admitting: Family

## 2008-12-26 ENCOUNTER — Ambulatory Visit: Payer: Self-pay | Admitting: Internal Medicine

## 2008-12-27 ENCOUNTER — Encounter (INDEPENDENT_AMBULATORY_CARE_PROVIDER_SITE_OTHER): Payer: Self-pay | Admitting: *Deleted

## 2008-12-27 LAB — CONVERTED CEMR LAB
Basophils Absolute: 0 10*3/uL (ref 0.0–0.1)
Basophils Relative: 0.5 % (ref 0.0–3.0)
Eosinophils Absolute: 0 10*3/uL (ref 0.0–0.7)
Eosinophils Relative: 0.7 % (ref 0.0–5.0)
HCT: 41.5 % (ref 36.0–46.0)
Hemoglobin: 13.9 g/dL (ref 12.0–15.0)
Lymphocytes Relative: 24.6 % (ref 12.0–46.0)
Lymphs Abs: 1.1 10*3/uL (ref 0.7–4.0)
MCHC: 33.5 g/dL (ref 30.0–36.0)
MCV: 96.4 fL (ref 78.0–100.0)
Monocytes Absolute: 0.5 10*3/uL (ref 0.1–1.0)
Monocytes Relative: 10.8 % (ref 3.0–12.0)
Neutro Abs: 2.7 10*3/uL (ref 1.4–7.7)
Neutrophils Relative %: 63.4 % (ref 43.0–77.0)
Platelets: 178 10*3/uL (ref 150.0–400.0)
RBC: 4.31 M/uL (ref 3.87–5.11)
RDW: 11.5 % (ref 11.5–14.6)
Sed Rate: 10 mm/hr (ref 0–22)
WBC: 4.3 10*3/uL — ABNORMAL LOW (ref 4.5–10.5)

## 2008-12-28 ENCOUNTER — Telehealth: Payer: Self-pay | Admitting: Internal Medicine

## 2008-12-31 ENCOUNTER — Telehealth: Payer: Self-pay | Admitting: Internal Medicine

## 2009-02-04 ENCOUNTER — Encounter: Payer: Self-pay | Admitting: Internal Medicine

## 2009-02-05 ENCOUNTER — Telehealth (INDEPENDENT_AMBULATORY_CARE_PROVIDER_SITE_OTHER): Payer: Self-pay | Admitting: *Deleted

## 2009-02-20 ENCOUNTER — Telehealth: Payer: Self-pay | Admitting: Internal Medicine

## 2009-02-20 ENCOUNTER — Encounter: Admission: RE | Admit: 2009-02-20 | Discharge: 2009-02-20 | Payer: Self-pay | Admitting: Neurology

## 2009-03-13 ENCOUNTER — Ambulatory Visit: Payer: Self-pay | Admitting: Internal Medicine

## 2009-03-13 DIAGNOSIS — G500 Trigeminal neuralgia: Secondary | ICD-10-CM | POA: Insufficient documentation

## 2009-03-13 LAB — CONVERTED CEMR LAB: Vit D, 25-Hydroxy: 30 ng/mL (ref 30–89)

## 2009-03-14 LAB — CONVERTED CEMR LAB
ALT: 22 units/L (ref 0–35)
AST: 25 units/L (ref 0–37)
BUN: 31 mg/dL — ABNORMAL HIGH (ref 6–23)
CO2: 27 meq/L (ref 19–32)
Calcium: 9.4 mg/dL (ref 8.4–10.5)
Chloride: 108 meq/L (ref 96–112)
Cholesterol: 170 mg/dL (ref 0–200)
Creatinine, Ser: 0.8 mg/dL (ref 0.4–1.2)
GFR calc non Af Amer: 76.96 mL/min (ref >60)
Glucose, Bld: 103 mg/dL — ABNORMAL HIGH (ref 70–99)
HDL: 82.8 mg/dL (ref >39.00)
Hgb A1c MFr Bld: 6.4 % (ref 4.6–6.5)
LDL Cholesterol: 72 mg/dL (ref 0–99)
Potassium: 4.2 meq/L (ref 3.5–5.1)
Sodium: 142 meq/L (ref 135–145)
Total CHOL/HDL Ratio: 2
Triglycerides: 74 mg/dL (ref 0.0–149.0)
VLDL: 14.8 mg/dL (ref 0.0–40.0)

## 2009-03-15 ENCOUNTER — Telehealth (INDEPENDENT_AMBULATORY_CARE_PROVIDER_SITE_OTHER): Payer: Self-pay | Admitting: *Deleted

## 2009-06-11 ENCOUNTER — Encounter: Payer: Self-pay | Admitting: Internal Medicine

## 2009-06-24 ENCOUNTER — Encounter: Payer: Self-pay | Admitting: Internal Medicine

## 2009-06-27 ENCOUNTER — Encounter: Payer: Self-pay | Admitting: Internal Medicine

## 2009-06-27 LAB — CONVERTED CEMR LAB
Calcium: 9.7 mg/dL
Creatinine, Ser: 0.86 mg/dL
Hgb A1c MFr Bld: 6.2 %
Microalbumin U total vol: 2.5 mg/L
Potassium, serum: 5 mmol/L

## 2009-07-09 ENCOUNTER — Ambulatory Visit (HOSPITAL_COMMUNITY): Admission: RE | Admit: 2009-07-09 | Discharge: 2009-07-09 | Payer: Self-pay | Admitting: Obstetrics and Gynecology

## 2009-08-29 ENCOUNTER — Ambulatory Visit: Payer: Self-pay | Admitting: Family Medicine

## 2009-08-29 DIAGNOSIS — K120 Recurrent oral aphthae: Secondary | ICD-10-CM | POA: Insufficient documentation

## 2009-09-11 ENCOUNTER — Ambulatory Visit: Payer: Self-pay | Admitting: Internal Medicine

## 2009-09-12 ENCOUNTER — Encounter: Payer: Self-pay | Admitting: Internal Medicine

## 2009-12-09 ENCOUNTER — Telehealth: Payer: Self-pay | Admitting: Gastroenterology

## 2009-12-24 ENCOUNTER — Encounter: Payer: Self-pay | Admitting: Internal Medicine

## 2009-12-30 ENCOUNTER — Ambulatory Visit: Payer: Self-pay | Admitting: Gastroenterology

## 2009-12-30 DIAGNOSIS — R1310 Dysphagia, unspecified: Secondary | ICD-10-CM | POA: Insufficient documentation

## 2010-01-10 ENCOUNTER — Ambulatory Visit (HOSPITAL_COMMUNITY)
Admission: RE | Admit: 2010-01-10 | Discharge: 2010-01-10 | Payer: Self-pay | Source: Home / Self Care | Attending: Gastroenterology | Admitting: Gastroenterology

## 2010-01-10 ENCOUNTER — Telehealth: Payer: Self-pay | Admitting: Gastroenterology

## 2010-01-10 ENCOUNTER — Encounter: Payer: Self-pay | Admitting: Gastroenterology

## 2010-01-21 ENCOUNTER — Encounter
Admission: RE | Admit: 2010-01-21 | Discharge: 2010-01-21 | Payer: Self-pay | Source: Home / Self Care | Attending: Endocrinology | Admitting: Endocrinology

## 2010-02-08 ENCOUNTER — Encounter: Payer: Self-pay | Admitting: Family Medicine

## 2010-02-08 ENCOUNTER — Encounter: Payer: Self-pay | Admitting: Obstetrics and Gynecology

## 2010-02-09 ENCOUNTER — Encounter: Payer: Self-pay | Admitting: Obstetrics and Gynecology

## 2010-02-09 ENCOUNTER — Encounter: Payer: Self-pay | Admitting: Orthopaedic Surgery

## 2010-02-12 ENCOUNTER — Encounter
Admission: RE | Admit: 2010-02-12 | Discharge: 2010-02-12 | Payer: Self-pay | Source: Home / Self Care | Attending: Endocrinology | Admitting: Endocrinology

## 2010-02-13 ENCOUNTER — Ambulatory Visit
Admission: RE | Admit: 2010-02-13 | Discharge: 2010-02-13 | Payer: Self-pay | Source: Home / Self Care | Attending: Gastroenterology | Admitting: Gastroenterology

## 2010-02-18 NOTE — Procedures (Signed)
Summary: Colonoscopy   Colonoscopy  Procedure date:  12/01/2007  Findings:      Location:  [facility].    Patient Name: [name] [name], [name] [name] MRN: [id number] Procedure Procedures: Colonoscopy CPT: 40981.  Personnel: Endoscopist: Barbette Hair. Arlyce Dice, MD.  Exam Location: Outpatient  Patient Consent: Procedure, Alternatives, Risks and Benefits discussed, consent obtained, from patient.  Indications Symptoms: Abdominal pain / bloating.  History  Current Medications: Patient is not currently taking Coumadin.  Pre-Exam Physical: Performed Dec 01, 2007. Cardio-pulmonary exam, HEENT exam , Abdominal exam, Mental status exam WNL.  Comments: Patient history reviewed/updated, physical performed prior to initiation of sedation?yes Exam Exam: Extent of exam reached: Cecum, extent intended: Cecum.  The cecum was identified by appendiceal orifice and IC valve. Time to Cecum: 00:02: 22. Time for Withdrawl: 00:03:21. Colon retroflexion performed. ASA Classification: II. Tolerance: good.  Monitoring: Pulse and BP monitoring, Oximetry used. Supplemental O2 given. at 2 Liters.  Colon Prep Used Miralax for colon prep. Prep results: excellent.  Sedation Meds: Patient assessed and found to be appropriate for moderate (conscious) sedation. Sedation was managed by the Endoscopist. Fentanyl 75 mcg. given IV. Versed 9 mg. given IV.  Comments: Shortened colon from prior surgery Findings - DIVERTICULOSIS: Ascending Colon to Transverse Colon. ICD9: Diverticulosis: 562.10. Comments: Scattered diverticula.  NORMAL EXAM: Cecum.  PRIOR SURGERY: Sigmoid Colon. Segmental Colectomy.  HEMORRHOIDS: Internal. ICD9: Hemorrhoids, Internal: 455.0.   Assessment Abnormal examination, see findings above.  Diagnoses: 562.10: Diverticulosis.  455.0: Hemorrhoids, Internal.   Events  Unplanned Interventions: No intervention was required.  Unplanned Events: There were no  complications. Plans  Post Exam Instructions: Post sedation instructions given.  Medication Plan: Anti-constipation: Elavil 25 HS, starting Dec 01, 2007   Patient Education: Patient given standard instructions for: Diverticulosis.  Disposition: After procedure patient sent to recovery. After recovery patient sent home.  Scheduling/Referral: Office Visit, to [employer]. Arlyce Dice, MD, around Dec 31, 2007.    cc: Willow Ora, MD   This report was created from the original endoscopy report, which was reviewed and signed by the above listed endoscopist.

## 2010-02-18 NOTE — Letter (Signed)
Summary: [facility]  [facility]   Imported By: Freddy Jaksch 05/30/2007 10:36:31  _____________________________________________________________________  External Attachment:    Type:   Image     Comment:   External Document

## 2010-02-18 NOTE — Assessment & Plan Note (Signed)
Summary: PROBLEMS W/GUMS NOT DENTAL RELATED/RH......   Vital Signs:  Patient profile:   65 year old female Menstrual status:  hysterectomy Height:      64.5 inches Weight:      126 pounds Temp:     98.5 degrees F oral BP sitting:   118 / 70  (left arm) Cuff size:   large  Vitals Entered By: Shary Decamp (December 26, 2008 9:44 AM) CC: acute only Comments  - pt c/o of pain left jawline, around ear, left sinus pain x 8/10  - went to dentist 8/10 had root canal, fx wisdom tooth, crown  - went back to dentist last week had xrays (panoramic xrays) - no abscess, no infection, roots lood good, sinuses clear  - dentist referred back to PCP because she feels like pt is having neuralgia Shary Decamp  December 26, 2008 9:54 AM    History of Present Illness:  complaining of facial pain since August 2010 pain is on and off, daily, " like a knife", may last 20 minutes to several hours. pain located @ the  left cheek, extending somehow to the left side of the nose, and to the left TMJ and is associated with runny nose,some nausea and eye tearing symptoms sometimes are worse when she chew has used NSAIDs with limited success, she had to discontinue it because GI irritation. Status post dental eval,  see nurses notes   Current Medications (verified): 1)  Crestor 10 Mg Tabs (Rosuvastatin Calcium) .... Take 1 Tablet By Mouth At Bedtime 2)  Xanax 0.5 Mg Tabs (Alprazolam) .[name].. 1 Po Qhs 3)  Amitriptyline Hcl 25 Mg Tabs (Amitriptyline Hcl) .... 1/2 By Mouth Qhs  Allergies (verified): 1)  Rocephin  Past History:  Past Medical History: Reviewed history from 09/04/2008 and no changes required. DM II Hyperlipidemia Osteopenia Diverticulosis, colon GERD 4-09.Marland Kitchenabdominal pain: EGD and CTs neg.Marland KitchenRx amytriptiline  (non ulcer dyspepsia) Anxiety solar lentigo, face ; s/p excision 3-09. Sees Dermatology q 6 months  Hypothyroidism  Dx 10-9 TSH 5.2 , then re-tested by Dr Talmage Nap and told ok  Past  Surgical History: Reviewed history from 03/07/2008 and no changes required. Cervical laminectomy Cholecystectomy Hysterectomy RT SHOULDER SURGER COLON RESECTION 1999 d/t diverticulitis ; Colonoscopy 12/01/07 :Tics, Dr Arlyce Dice 1/08 CT FOR SPLEEN LESION - STABLE; PTX,S/P chest tube  Social History: Reviewed history from 04/12/2008 and no changes required. Married 2 boys/children Patient is a former smoker.  30 yrs ago Alcohol Use - yes/ socially Patient gets regular exercise. 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 Some weight loss, patient thinks is due to decrease food intake from pain. ENT:  no nausea, per se or hearing loss. No sinus congestion or sinus discharge. Derm:  no facial rash, that she can tell. Neuro:  no headache, per se, no diplopia or amaurosix fugax no pain or swelling at the temples.  Physical Exam  General:  alert, well-developed, and well-nourished.   Head:  face is symmetric, without a rash. No tender to palpation in the maxillary areas. good bilateral temporal pulses, without tenderness, redness, or puffiness Ears:  external local movements are intact, pupils are equal, and reactive tympanic membranes normal bilaterally Nose:  not congested Mouth:  not tender to palpation at the gum line Lungs:  normal respiratory effort, no intercostal retractions, no accessory muscle use, and normal breath sounds.   Heart:  normal rate, regular rhythm, and no murmur.   Extremities:  no edema Neurologic:  speech clear. Gait normal. Motor intact   Impression & Recommendations:  Problem # 1:  FACIAL PAIN (ICD-784.0) Assessment New  symptoms to suggest a trigeminal neuralgia but  temporal arteritis must be rule out. Plan: Sed rate, CBC start Neurontin see the  prescription  (noting  that the patient also takes a low dose of Elavil, considered discontinued Elavil, if she is too somnolent) Neurology  referral  Orders: Venipuncture (60454) TLB-Sedimentation Rate (ESR) (85652-ESR) TLB-CBC Platelet - w/Differential (85025-CBCD) Neurology Referral (Neuro)  Her updated medication list for this problem includes:    Vicodin 5-500 Mg Tabs (Hydrocodone-acetaminophen) ..... One or two by mouth every 6 hours as needed for pain  Problem # 2:  4  Complete Medication List: 1)  Crestor 10 Mg Tabs (Rosuvastatin calcium) .... Take 1 tablet by mouth at bedtime 2)  Xanax 0.5 Mg Tabs (Alprazolam) .[name].. 1 po qhs 3)  Amitriptyline Hcl 25 Mg Tabs (Amitriptyline hcl) .... 1/2 by mouth qhs 4)  Neurontin 100 Mg Caps (Gabapentin) .... One by mouth 3 times a day for one week, then take two by mouth 3 times a day 5)  Vicodin 5-500 Mg Tabs (Hydrocodone-acetaminophen) .... One or two by mouth every 6 hours as needed for pain  Patient Instructions: 1)  call any time if the pain is worse. 2)  Call any time if you are to sleepy  from Neurontin or if you have side effects Prescriptions: VICODIN 5-500 MG TABS (HYDROCODONE-ACETAMINOPHEN) one or two by mouth every 6 hours as needed for pain  #40 x 0   Entered and Authorized by:   Nolon Rod. Dontavion Noxon MD   Signed by:   Nolon Rod. Adyan Palau MD on 12/26/2008   Method used:   Print then Give to Patient   RxID:   [id number] NEURONTIN 100 MG CAPS (GABAPENTIN) one by mouth 3 times a day for one week, then take two by mouth 3 times a day  #180 x 1   Entered and Authorized by:   Elita Quick E. Ellyana Crigler MD   Signed by:   Nolon Rod. Marialena Wollen MD on 12/26/2008   Method used:   [facility]        [facility]  [place] [address]ail[facility]       [address]       [place], [place]  [address]       Ph: [phone] or [phone]       Fax: [phone]   RxID:   [id number]

## 2010-02-18 NOTE — Progress Notes (Signed)
Summary: update   Phone Note Call from Patient Call back at Home Phone [phone]   Caller: Patient Reason for Call: Talk to Nurse Summary of Call: pt had a ct on monday and was told to call us today for update on how she was doing ...states that she had some rough spots but that overall she is doing good she is still taking med (benton) and will come to her f/u app on the 26th...chart reading to dr Nita Sells office  Initial call taken by: Tawni Levy,  May 20, 2007 9:30 AM  Follow-up for Phone Call        Pt. taking Bentyl 10mg  QID. States she is doing well.Pt. to keep scheduled office visit. Pt. instructed to call back as needed.  Follow-up by: Laureen Ochs LPN,  May 20, 979 1:37 PM

## 2010-02-18 NOTE — Assessment & Plan Note (Signed)
Summary: return after egd and cat scan/tcw  Medications Added XANAX 0.5 MG TABS (ALPRAZOLAM) 1 po qhs CALCIUM 600/VITAMIN D 600-400 MG-UNIT  TABS (CALCIUM CARBONATE-VITAMIN D) Take 1 tablet by mouth once a day MULTIVITAMINS   TABS (MULTIPLE VITAMIN) Take 1 tablet by mouth once a day CRANBERRY 500 MG  CAPS (CRANBERRY) .qdtab        History of Present Illness Visit Type: follow up Primary GI MD: Melvia Heaps MD [facility] Primary Provider: Vernie Shanks MD Chief Complaint: F/U EGD and CT History of Present Illness:   [name] [name] has returned for scheduled GI followup. She's been undergoing evaluation for post prandial abdominal pain. Pain continues to a mild degree. She takes Trental with relief of Bentyl causes constipation. CT Scan on 4 2709 was unremarkable. Upper endoscopy on 4 2309 was also normal. She is currently on no other GI medicines. She does state that she tends to develop pain when taking antibiotics.    GI Review of Systems    Reports abdominal pain, acid reflux, belching, loss of appetite, and  weight loss.     Location of  Abdominal pain: epigastric area.          Updated Prior Medication List: CRESTOR 10 MG TABS (ROSUVASTATIN CALCIUM) Take 1 tablet by mouth at bedtime XANAX 0.5 MG TABS (ALPRAZOLAM) 1 po qhs CALCIUM 600/VITAMIN D 600-400 MG-UNIT  TABS (CALCIUM CARBONATE-VITAMIN D) Take 1 tablet by mouth once a day MULTIVITAMINS   TABS (MULTIPLE VITAMIN) Take 1 tablet by mouth once a day CRANBERRY 500 MG  CAPS (CRANBERRY) .qdtab  Current Allergies (reviewed today): ROCEPHIN   Family History:    n.c.    No FH of Colon Cancer:    Family History of Prostate Cancer:    Family History of Diabetes:     Pt has had melanoma    Sister /Clear cell carcinoma  Social History:    Married    2 boys/children    Patient is a former smoker.  30 yrs ago    Alcohol Use - yes/ socially    Patient gets regular exercise.   Risk Factors:  Tobacco use:  quit Alcohol use:   yes Exercise:  yes   Review of Systems       The patient complains of weight loss and abdominal pain.         epigastric pain/abd   Vital Signs:  Patient Profile:   65 Years Old Female Height:     64 inches Weight:      119.50 pounds BMI:     20.59 Pulse rate:   72 / minute BP sitting:   102 / 70  (left arm)  Vitals Entered By: Lowry Ram CMA (Jun 14, 2007 9:41 AM)                    Impression & Recommendations:  Problem # 1:  ABDOMINAL PAIN, EPIGASTRIC (ICD-789.06) Her abdominal pain is likely due to nonulcer dyspepsia. Her examinations have not revealed an active intra-abdominal process.  Recommendations #1 continue Bentyl as needed   #2 no further GI workup at this time     ]

## 2010-02-18 NOTE — Assessment & Plan Note (Signed)
Summary: F/U.Marland KitchenEM  Medications Added * CRANBERRY TABLET Take 1 tablet by mouth once a day      Allergies Added:   History of Present Illness Visit Type: Follow-up Visit Primary GI MD: Melvia Heaps MD [facility] Primary Provider: Willow Ora, MD Requesting Provider: B. Romero Belling, MD Chief Complaint: Patient here for f/u from last visit for abdominal pain.  Patient states that she is now doing well and has no GI complaints. History of Present Illness:   [name] [name]  has returned for follow up of her abdominal pain.  On a regimen of Elavil 12.5-25 mg nightly she has done extremely well.  She has rarely had breakthrough abdominal discomfort which has been very mild.  She does not tolerate oral calcium that she has tried to use for her osteopenia.  She has no other GI complaints.   GI Review of Systems    Reports acid reflux and  heartburn.      Denies abdominal pain, belching, bloating, chest pain, dysphagia with liquids, dysphagia with solids, loss of appetite, nausea, vomiting, vomiting blood, weight loss, and  weight gain.      Reports diverticulosis and  hemorrhoids.     Denies anal fissure, black tarry stools, change in bowel habit, constipation, diarrhea, fecal incontinence, heme positive stool, irritable bowel syndrome, jaundice, light color stool, liver problems, rectal bleeding, and  rectal pain. Preventive Screening-Counseling & Management      Drug Use:  no.      Current Medications (verified): 1)  Crestor 10 Mg Tabs (Rosuvastatin Calcium) .... Take 1 Tablet By Mouth At Bedtime 2)  Xanax 0.5 Mg Tabs (Alprazolam) .[name].. 1 Po Qhs 3)  Multivitamins   Tabs (Multiple Vitamin) .... Take 1 Tablet By Mouth Once A Day 4)  Amitriptyline Hcl 25 Mg Tabs (Amitriptyline Hcl) .... 1/2 By Mouth Qd 5)  Cranberry Tablet .... Take 1 Tablet By Mouth Once A Day  Allergies (verified): 1)  Rocephin  Family History:    Reviewed history from 03/07/2008 and no changes required:       Colon Cancer:no  Prostate Cancer--F       breast ca--no       Diabetes-- no       MI-- GF in his 46s        melanoma--uncle       Sister, endometrial Ca and Clear cell carcinoma  Social History:    Married    2 boys/children    Patient is a former smoker.  30 yrs ago    Alcohol Use - yes/ socially    Patient gets regular exercise.    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    Drug Use:  no  Review of Systems  The patient denies allergy/sinus, anemia, anxiety-new, arthritis/joint pain, back pain, blood in urine, breast changes/lumps, change in vision, confusion, cough, coughing up blood, depression-new, fainting, fatigue, fever, headaches-new, hearing problems, heart murmur, heart rhythm changes, itching, menstrual pain, muscle pains/cramps, night sweats, nosebleeds, pregnancy symptoms, shortness of breath, skin rash, sleeping problems, sore throat, swelling of feet/legs, swollen lymph glands, thirst - excessive , urination - excessive , urination changes/pain, urine leakage, vision changes, and voice change.    Vital Signs:  Patient profile:   65 year old female Height:      64.5 inches Weight:      127.25 pounds BMI:     21.58 BSA:     1.62 Pulse rate:   80 / minute Pulse rhythm:  regular BP sitting:   104 / 68  (left arm)  Vitals Entered By: Hortense Ramal CMA (April 12, 2008 10:00 AM)   Impression & Recommendations:  Problem # 1:  ABDOMINAL PAIN, GENERALIZED (ICD-789.07) Assessment Improved Plan to continue amitriptyline 12.5-25 mg a day.    Problem # 2:  OSTEOPENIA (ICD-733.90) I advised the patient to talk with Dr.Paz regarding alternative calcium preparations.  Patient Instructions: 1)  We are giving you a printed rx today to take to your pharmacy 2)  The medication list was reviewed and reconciled.  All changed / newly prescribed medications were explained.  A complete medication list was provided to the patient / caregiver. Prescriptions: AMITRIPTYLINE HCL 25 MG TABS  (AMITRIPTYLINE HCL) 1/2 by mouth qd  #90 x 4   Entered by:   Merri Ray CMA   Authorized by:   Louis Meckel MD   Signed by:   Merri Ray CMA on 04/12/2008   Method used:   Print then Give to Patient   RxID:   [id number] AMITRIPTYLINE HCL 25 MG TABS (AMITRIPTYLINE HCL) 1/2 by mouth qd  #90 x 4   Entered and Authorized by:   Louis Meckel MD   Signed by:   Louis Meckel MD on 04/12/2008   Method used:   [facility]        [facility]  [place] [address] [facility]       [address]       [place], [place]  [address]       Ph: [phone] or [phone]       Fax: [phone]   RxID:   [id number]  The med sent electronically, done in error

## 2010-02-18 NOTE — Letter (Signed)
Summary: endocrinology at [facility]   Imported By: Freddy Jaksch 05/03/2007 15:49:13  _____________________________________________________________________  External Attachment:    Type:   Image     Comment:   External Document

## 2010-02-18 NOTE — Letter (Signed)
Summary: Results Follow up Letter  [facility]  [address] [place], [place] [address]   Phone: [phone]  Fax: [phone]    12/27/2008 MRN: [id number]  [facility] [address] [place], [place]  [address]  Dear [name] [name],  The following are the results of your recent test(s):  Test         Result    Pap Smear:        Normal _____  Not Normal _____ Comments: ______________________________________________________ Cholesterol: LDL(Bad cholesterol):         Your goal is less than:         HDL (Good cholesterol):       Your goal is more than: Comments:  ______________________________________________________ Mammogram:        Normal _____  Not Normal _____ Comments:  ___________________________________________________________________ Hemoccult:        Normal _____  Not normal _______ Comments:    _____________________________________________________________________ Other Tests:  Dr. Drue Novel has reviewed your labs.  All results are normal.  We routinely do not discuss normal results over the telephone.  If you desire a copy of the results, or you have any questions about this information we can discuss them at your next office visit.   Sincerely,

## 2010-02-18 NOTE — Assessment & Plan Note (Signed)
Summary: 6 month f.u/cbs   Vital Signs:  Patient profile:   65 year old female Height:      64.5 inches Weight:      125.4 pounds Pulse rate:   82 / minute Pulse rhythm:   regular BP sitting:   110 / 68  (left arm) Cuff size:   regular  Vitals Entered By: Shary Decamp (September 04, 2008 9:40 AM) CC: rov - fasting   History of Present Illness: 6 month ROV DM II-- CBGs in AM varies from 119 to 140s Hyperlipidemia-- good medication compliance w/o s/e  Osteopenia-- note from GI reviewed, they rec alternative for OTC Ca++ d/t GI s/e  Hypothyroidism--  Dx 10-9 TSH 5.2 , then re-tested by Dr Talmage Nap and told ok    Current Medications (verified): 1)  Crestor 10 Mg Tabs (Rosuvastatin Calcium) .... Take 1 Tablet By Mouth At Bedtime 2)  Xanax 0.5 Mg Tabs (Alprazolam) .[name].. 1 Po Qhs 3)  Multivitamins   Tabs (Multiple Vitamin) .... Take 1 Tablet By Mouth Once A Day 4)  Amitriptyline Hcl 25 Mg Tabs (Amitriptyline Hcl) .... 1/2 By Mouth Qd 5)  Cranberry Tablet .... Take 1 Tablet By Mouth Once A Day  Allergies (verified): 1)  Rocephin  Past History:  Past Medical History: DM II Hyperlipidemia Osteopenia Diverticulosis, colon GERD 4-09.Marland Kitchenabdominal pain: EGD and CTs neg.Marland KitchenRx amytriptiline  (non ulcer dyspepsia) Anxiety solar lentigo, face ; s/p excision 3-09. Sees Dermatology q 6 months  Hypothyroidism  Dx 10-9 TSH 5.2 , then re-tested by Dr Talmage Nap and told ok  Past Surgical History: Reviewed history from 03/07/2008 and no changes required. Cervical laminectomy Cholecystectomy Hysterectomy RT SHOULDER SURGER COLON RESECTION 1999 d/t diverticulitis ; Colonoscopy 12/01/07 :Tics, Dr Arlyce Dice 1/08 CT FOR SPLEEN LESION - STABLE; PTX,S/P chest tube  Social History: Reviewed history from 04/12/2008 and no changes required. Married 2 boys/children Patient is a former smoker.  30 yrs ago Alcohol Use - yes/ socially Patient gets regular exercise. 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 CV:  Denies chest pain or discomfort and swelling of feet. Resp:  Denies cough and shortness of breath. Endo:  (-) LE paresthesias  .  Physical Exam  General:  alert, well-developed, and well-nourished.   Lungs:  normal respiratory effort, no intercostal retractions, no accessory muscle use, and normal breath sounds.   Heart:  normal rate, regular rhythm, and no murmur.   Extremities:  no pretibial edema bilaterally   Diabetes Management Exam:    Foot Exam (with socks and/or shoes not present):       Sensory-Pinprick/Light touch:          Left medial foot (L-4): normal          Left dorsal foot (L-5): normal          Left lateral foot (S-1): normal          Right medial foot (L-4): normal          Right dorsal foot (L-5): normal          Right lateral foot (S-1): normal       Sensory-Monofilament:          Left foot: normal          Right foot: normal       Inspection:          Left foot: normal  Right foot: normal       Nails:          Left foot: normal          Right foot: normal   Impression & Recommendations:  Problem # 1:  HYPOTHYROIDISM (ICD-244.9) labs  Orders: Venipuncture (45409) TLB-TSH (Thyroid Stimulating Hormone) (84443-TSH)  Labs Reviewed: TSH: 5.220 (10/25/2007)    HgBA1c: 6.2 (10/25/2007) Chol: 184 (03/07/2008)   HDL: 88.2 (03/07/2008)   LDL: 85 (03/07/2008)   TG: 56 (03/07/2008)  Problem # 2:  DIABETES MELLITUS, TYPE II (ICD-250.00)  labs, continue  w/  healthy life style  Orders: TLB-Microalbumin/Creat Ratio, Urine (82043-MALB) TLB-A1C / Hgb A1C (Glycohemoglobin) (83036-A1C)  Labs Reviewed: Creat: 0.9 (03/07/2008)    Reviewed HgBA1c results: 6.2 (10/25/2007)  6.1 (02/14/2007)  Problem # 3:  OSTEOPENIA (ICD-733.90)  note from GI reviewed, they rec alternative for OTC Ca++ d/t GI s/e  printed material provided regards rich Ca++ diet Orders: T-Vitamin D  (25-Hydroxy) (81191-47829)  Problem # 4:  HYPERLIPIDEMIA (ICD-272.4) at goal  Her updated medication list for this problem includes:    Crestor 10 Mg Tabs (Rosuvastatin calcium) .[name]... Take 1 tablet by mouth at bedtime  Labs Reviewed: SGOT: 24 (03/07/2008)   SGPT: 20 (03/07/2008)   HDL:88.2 (03/07/2008), 80.1 (02/14/2007)  LDL:85 (03/07/2008), 83 (02/14/2007)  Chol:184 (03/07/2008), 174 (02/14/2007)  Trig:56 (03/07/2008), 54 (02/14/2007)  Complete Medication List: 1)  Crestor 10 Mg Tabs (Rosuvastatin calcium) .... Take 1 tablet by mouth at bedtime 2)  Xanax 0.5 Mg Tabs (Alprazolam) .[name].. 1 po qhs 3)  Multivitamins Tabs (Multiple vitamin) .... Take 1 tablet by mouth once a day 4)  Amitriptyline Hcl 25 Mg Tabs (Amitriptyline hcl) .... 1/2 by mouth qd 5)  Cranberry Tablet  .... Take 1 tablet by mouth once a day  Patient Instructions: 1)  Please schedule a follow-up appointment in 6 months, yearly  Prescriptions: AMITRIPTYLINE HCL 25 MG TABS (AMITRIPTYLINE HCL) 1/2 by mouth qd  #45 x 1   Entered by:   Shary Decamp   Authorized by:   Nolon Rod. Paz MD   Signed by:   Shary Decamp on 09/04/2008   Method used:   Printed then faxed to ...       [facility] ([facility]       [address]       [place], [place]  [address]       Ph: [phone]       Fax: [phone]   RxID:   [id number] XANAX 0.5 MG TABS (ALPRAZOLAM) 1 po qhs  #90 x 1   Entered by:   Shary Decamp   Authorized by:   Nolon Rod. Paz MD   Signed by:   Shary Decamp on 09/04/2008   Method used:   Printed then faxed to ...       [facility] ([facility]ail[facility]       [address]       [place], [place]  [address]       Ph: [phone]       Fax: [phone]   RxID:   [id number] CRESTOR 10 MG TABS (ROSUVASTATIN CALCIUM) Take 1 tablet by mouth at bedtime  #90 x 1   Entered by:   Shary Decamp   Authorized by:   Nolon Rod. Paz MD   Signed by:   Shary Decamp on 09/04/2008   Method used:   Printed then faxed  to ...       [facility] (retail[facility]       [address]       [place], [place]  [address]       Ph: [phone]       Fax: [phone]   RxID:   [id number]

## 2010-02-18 NOTE — Letter (Signed)
Summary: Cancer Screening/[facility] Personalized Risk Profile  Cancer Screening/[facility] Personalized Risk Profile   Imported By: Lanelle Bal 03/09/2008 13:12:44  _____________________________________________________________________  External Attachment:    Type:   Image     Comment:   External Document

## 2010-02-18 NOTE — Miscellaneous (Signed)
  Clinical Lists Changes  Medications: Added new medication of AMITRIPTYLINE HCL 25 MG TABS (AMITRIPTYLINE HCL) 1 tab before retiring for 7 days then increase to 2 tabs - Signed Rx of AMITRIPTYLINE HCL 25 MG TABS (AMITRIPTYLINE HCL) 1 tab before retiring for 7 days then increase to 2 tabs;  #45 x 2;  Signed;  Entered by: Louis Meckel MD;  Authorized by: Louis Meckel MD;  Method used: [facility]  [place] [address], [place], [place]  [address], Ph: [phone] or [phone], Fax: [phone]    Prescriptions: AMITRIPTYLINE HCL 25 MG TABS (AMITRIPTYLINE HCL) 1 tab before retiring for 7 days then increase to 2 tabs  #45 x 2   Entered and Authorized by:   Louis Meckel MD   Signed by:   Louis Meckel MD on 12/01/2007   Method used:   [facility]        [facility]  [place] [address]       [address]       [place], [place]  [address]       Ph: [phone] or [phone]       Fax: [phone]   RxID:   [id number]

## 2010-02-18 NOTE — Assessment & Plan Note (Signed)
Summary: roa 4 months.cbs   Vital Signs:  Patient Profile:   65 Years Old Female Height:     64 inches Weight:      119.6 pounds Pulse rate:   80 / minute BP sitting:   130 / 80  Vitals Entered By: Shary Decamp (July 08, 2007 1:29 PM)                 PCP:  Vernie Shanks MD  Chief Complaint:  rov.  History of Present Illness: ROV saw endocrinology , see a/p her CC is her weight Dr Talmage Nap is concern about pt  weight, was rec to eat more carbs    Prior Medication List:  CRESTOR 10 MG TABS (ROSUVASTATIN CALCIUM) Take 1 tablet by mouth at bedtime XANAX 0.5 MG TABS (ALPRAZOLAM) 1 po qhs CALCIUM 600/VITAMIN D 600-400 MG-UNIT  TABS (CALCIUM CARBONATE-VITAMIN D) Take 1 tablet by mouth once a day MULTIVITAMINS   TABS (MULTIPLE VITAMIN) Take 1 tablet by mouth once a day CRANBERRY 500 MG  CAPS (CRANBERRY) .qdtab   Current Allergies (reviewed today): ROCEPHIN  Past Medical History:    Hyperlipidemia    Osteopenia    Diverticulosis, colon    GERD    4-09.Marland Kitchenabdominal pain: EGD and CTs neg.Marland KitchenRx Bentyl (non ulcer dyspepsia    Anxiety    Diabetes mellitus, type II  Past Surgical History:    Reviewed history from 05/10/2006 and no changes required:       Cervical laminectomy       Cholecystectomy       Hysterectomy       RT SHOULDER SURGER       COLON RESECTION 1999       1/08 CT FOR SPLEEN LESION - STABLE     Review of Systems  Psych      pt looks emotional, states is due to her husband's health denies depression anxiety at night, on xanax    Physical Exam  General:     alert, well-developed, and well-nourished.   Neck:     no masses.   Lungs:     normal respiratory effort, no intercostal retractions, no accessory muscle use, and normal breath sounds.   Heart:     normal rate, regular rhythm, and no murmur.      Impression & Recommendations:  Problem # 1:  DIABETES MELLITUS, TYPE II (ICD-250.00) 06-28-07 A1C 6.3 @ Dr Talmage Nap office  per pt microalb neg TSH  4.0 Cr 0.9, K4.8, Ca normal   Problem # 2:  WEIGHT LOSS (ICD-783.21) WT was 124, then 119, today again 119 see labs above recent CT abd (-) recent EGD (-) wt is stable, related to anxiety? labs and a CXR, reasses on RTC Orders: Venipuncture (64332) T-2 View CXR (71020TC)   Problem # 3:  ABDOMINAL PAIN, EPIGASTRIC (ICD-789.06) w/u per GI reviewed  Complete Medication List: 1)  Crestor 10 Mg Tabs (Rosuvastatin calcium) .... Take 1 tablet by mouth at bedtime 2)  Xanax 0.5 Mg Tabs (Alprazolam) .[name].. 1 po qhs 3)  Calcium 600/vitamin D 600-400 Mg-unit Tabs (Calcium carbonate-vitamin d) .... Take 1 tablet by mouth once a day 4)  Multivitamins Tabs (Multiple vitamin) .... Take 1 tablet by mouth once a day 5)  Cranberry 500 Mg Caps (Cranberry) .... .qdtab   Patient Instructions: 1)  Please schedule a follow-up appointment in 4 months.   Prescriptions: XANAX 0.5 MG TABS (ALPRAZOLAM) 1 po qhs  #90 x 1   Entered and Authorized by:  Jose E. Paz MD   Signed by:   Nolon Rod. Paz MD on 07/08/2007   Method used:   Print then Give to Patient   RxID:   [id number]  ]

## 2010-02-18 NOTE — Miscellaneous (Signed)
Summary: [facility] Previsit/prep  Clinical Lists Changes  Medications: Added new medication of DULCOLAX 5 MG  TBEC (BISACODYL) Day before procedure take 2 at 3pm and 2 at 8pm. - Signed Added new medication of METOCLOPRAMIDE HCL 10 MG  TABS (METOCLOPRAMIDE HCL) As per prep instructions. - Signed Added new medication of MIRALAX   POWD (POLYETHYLENE GLYCOL 3350) As per prep  instructions. - Signed Rx of DULCOLAX 5 MG  TBEC (BISACODYL) Day before procedure take 2 at 3pm and 2 at 8pm.;  #4 x 0;  Signed;  Entered by: Wyona Almas RN;  Authorized by: Louis Meckel MD;  Method used: [facility]  [place] [address] [address], [place], [place]  [address], Ph: [phone] or [phone], Fax: [phone] Rx of METOCLOPRAMIDE HCL 10 MG  TABS (METOCLOPRAMIDE HCL) As per prep instructions.;  #2 x 0;  Signed;  Entered by: Wyona Almas RN;  Authorized by: Louis Meckel MD;  Method used: [facility]  [place] [address], [place], [place]  [address], Ph: [phone] or [phone], Fax: [phone] Rx of MIRALAX   POWD (POLYETHYLENE GLYCOL 3350) As per prep  instructions.;  #255gm x 0;  Signed;  Entered by: Wyona Almas RN;  Authorized by: Louis Meckel MD;  Method used: [facility]  [place] [address], [place], [place]  [address], Ph: [phone] or [phone], Fax: [phone] Observations: Added new observation of ALLERGY REV: Done (11/29/2007 15:42)    Prescriptions: MIRALAX   POWD (POLYETHYLENE GLYCOL 3350) As per prep  instructions.  #255gm x 0   Entered by:   Wyona Almas RN   Authorized by:   Louis Meckel MD   Signed by:   Wyona Almas RN on 11/29/2007   Method used:   [facility]ronically to        [facility]  [place] [address] ([facility]ail)       [address]       [place], [place]  [address]       Ph: [phone] or [phone]       Fax: [phone]   RxID:   [id number] METOCLOPRAMIDE HCL 10 MG  TABS (METOCLOPRAMIDE  HCL) As per prep instructions.  #2 x 0   Entered by:   Wyona Almas RN   Authorized by:   Louis Meckel MD   Signed by:   Wyona Almas RN on 11/29/2007   Method used:   [facility]ron[facility] to        [facility]  [place] [address] ([facility]ail)       [address]       [place], [place]  [address]       Ph: [phone] or [phone]       Fax: [phone]   RxID:   [id number] DULCOLAX 5 MG  TBEC (BISACODYL) Day before procedure take 2 at 3pm and 2 at 8pm.  #4 x 0   Entered by:   Wyona Almas RN   Authorized by:   Louis Meckel MD   Signed by:   Wyona Almas RN on 11/29/2007   Method used:   [facility]ronically to        [facility]  [place] [address] (retail)       [address]       [place], [place]  [address]       Ph: [phone] or [phone]  Fax: [phone]   RxID:   [id number]

## 2010-02-18 NOTE — Miscellaneous (Signed)
Summary: RR med/ elavil  Clinical Lists Changes  Observations: Added new observation of ALLERGY REV: Done (12/01/2007 13:07)

## 2010-02-18 NOTE — Assessment & Plan Note (Signed)
Summary: acute only for ear infection and congestion//PH   Vital Signs:  Patient Profile:   65 Years Old Female Height:     64 inches Weight:      123.38 pounds Temp:     97.3 degrees F oral BP sitting:   110 / 70  Vitals Entered By: Kandice Hams (January 10, 2008 10:07 AM)                 PCP:  Willow Ora MD  Chief Complaint:  c/o cough non productive, left ear pain , lest side of neck hurts, laryngitis, and sore throat.  History of Present Illness: 65 yo woman here today for laryngitis, L ear pain, cough-nonproductive.  Sxs started yesterday.  4 weeks ago had bronchitis.  No fever.  + sore throat.  LUQ abdominal pain- denies dysuria, hematuria, urgency, hesitancy.  + frequency but increased fluid intake.  Tylenol did not relieve pain.  No pain w/ coughing, pain when lying on that side.  No N/V/D.  No SOB.    Current Allergies (reviewed today): ROCEPHIN     Review of Systems      See HPI   Physical Exam  General:     in no acute distress; alert,appropriate and cooperative throughout examination Head:     Normocephalic and atraumatic without obvious abnormalities. No apparent alopecia or balding.  no TTP over sinuses Eyes:     no injxn or inflammation Ears:     L TM dull and red w/ obvious fluid level R TM WNL Nose:     External nasal examination shows no deformity or inflammation. Nasal mucosa are pink and moist without lesions or exudates. Mouth:     Oral mucosa and oropharynx without lesions or exudates.  Teeth in good repair. Hoarse Neck:     no masses.   Lungs:     Normal respiratory effort, chest expands symmetrically. Expiratory wheezes diffusely Heart:     normal rate, regular rhythm, and no murmur.   Abdomen:     soft, NT/ND, +BS.  mild L CVA tenderness Pulses:     +2 carotid, radial Cervical Nodes:     shotty post chain nodes    Impression & Recommendations:  Problem # 1:  OTITIS MEDIA, ACUTE, LEFT (ICD-382.9) Assessment: New Pt w/ L OM  on exam.  Will tx w/ Amox- pt allergic to cephalosporin but reports taking Amox in past w/out problem.  Reviewed supportive care.  Pt to use inhaler for expiratory wheezing.  Pt expresses understanding and is in agreement w/ this plan. Orders: Rapid Strep ([address]) Flu A+B ([address])  Her updated medication list for this problem includes:    Amoxicillin 500 Mg Tabs (Amoxicillin) .[name]... 1 tab by mouth three times a day x 7 days.  pt has taken before w/out rxn   Problem # 2:  FLANK PAIN, LEFT (ICD-789.09) Assessment: New Pt w/ mild CVA tenderness on exam.  UA not very suspicious of infxn- will cx and change abx as needed.  Pt reports hx of frequent bronchitis w/ pleurisy.  NSAIDs as needed for pain.  Pt expresses understanding and is in agreement w/ this plan. Orders: UA Dipstick w/o Micro (manual) ([address]) T-Culture, Urine ([id number]) TLB-Udip ONLY (81003-UDIP)   Complete Medication List: 1)  Crestor 10 Mg Tabs (Rosuvastatin calcium) .... Take 1 tablet by mouth at bedtime 2)  Xanax 0.5 Mg Tabs (Alprazolam) .[name].. 1 po qhs 3)  Multivitamins Tabs (Multiple vitamin) .... Take 1 tablet by mouth once  a day 4)  Amitriptyline Hcl 25 Mg Tabs (Amitriptyline hcl) .[name].. 1 tab before retiring for 7 days then increase to 2 tabs 5)  Amoxicillin 500 Mg Tabs (Amoxicillin) .[name].. 1 tab by mouth three times a day x 7 days.  pt has taken before w/out rxn 6)  Ventolin Hfa 108 (90 Base) Mcg/act Aers (Albuterol sulfate) .... 2 puffs every 4 hours as needed for wheezing   Patient Instructions: 1)  Follow up as needed 2)  Continue to drink plenty of fluids 3)  Amoxicillin three times a day for 7 days 4)  We will culture your urine and call if we need to change anything 5)  Drink plenty of fluids 6)  Continue your narcotic cough syrup at night as needed 7)  Mucinex DM as needed for cough and chest congestion 8)  Use the inhaler for wheezing 9)  Have a wonderful holiday!   Prescriptions: VENTOLIN HFA 108 (90  BASE) MCG/ACT AERS (ALBUTEROL SULFATE) 2 puffs every 4 hours as needed for wheezing  #1 x 0   Entered and Authorized by:   Neena Rhymes MD   Signed by:   Neena Rhymes MD on 01/10/2008   Method used:   [facility]        [facility]  [place] [address]       [address]       [place], [place]  [address]       Ph: [phone] or [phone]       Fax: [phone]   RxID:   [id number] AMOXICILLIN 500 MG TABS (AMOXICILLIN) 1 tab by mouth three times a day x 7 days.  pt has taken before w/out rxn  #21 x 0   Entered and Authorized by:   Neena Rhymes MD   Signed by:   Neena Rhymes MD on 01/10/2008   Method used:   [facility]        [facility]  [place] [address]       [address]       [place], [place]  [address]       Ph: [phone] or [phone]       Fax: [phone]   RxID:   [id number]  ] Laboratory Results   Urine Tests    Routine Urinalysis   Color: lt. yellow Appearance: Clear Glucose: negative   (Normal Range: Negative) Bilirubin: negative   (Normal Range: Negative) Ketone: negative   (Normal Range: Negative) Spec. Gravity: 1.010   (Normal Range: 1.003-1.035) Blood: negative   (Normal Range: Negative) pH: 6.5   (Normal Range: 5.0-8.0) Protein: trace   (Normal Range: Negative) Urobilinogen: 0.2   (Normal Range: 0-1) Nitrite: negative   (Normal Range: Negative) Leukocyte Esterace: trace   (Normal Range: Negative)      Other Tests  Rapid Strep: negative Influenza A: negative

## 2010-02-18 NOTE — Procedures (Signed)
Summary: Gastroenterology EGD  Gastroenterology EGD   Imported By: Irma Newness 06/09/2007 14:09:44  _____________________________________________________________________  External Attachment:    Type:   Image     Comment:   External Document

## 2010-02-18 NOTE — Assessment & Plan Note (Signed)
Summary: SEVERE PAIN IN THE ABD AND RADIATES TO BACK.Marland KitchenEM  Medications Added ACIPHEX 20 MG TBEC (RABEPRAZOLE SODIUM) two times a day DARVOCET A500 100-500 MG TABS (PROPOXYPHENE N-APAP) 2 TABS Q4-6H PRN        History of Present Illness Visit Type: consult Primary GI MD: Melvia Heaps MD [facility] Primary Provider: Willow Ora MD Requesting Provider: B. Romero Belling, MD Chief Complaint: abdominal pain History of Present Illness:   [name] has returned for reevaluation of abdominal pain. Since her last visit she had been doing fairly well until the last week when she again developed abdominal pain.  Pain is severe, begins in the mid epigastrium and radiates around and through to the back.  It is unaffected by eating.  Has been intermittent over the past week and has kept her awake at night.  Bentyl has perhaps given some relief.  She increase her AcipHex to twice a day without improvement.  She denies jaundice or discoloration of her urine.  She is queasy and nausea but has not vomited.    GI Review of Systems    Reports abdominal pain, belching, and  nausea.     Location of  Abdominal pain: upper abdomen.    Denies acid reflux, bloating, chest pain, dysphagia with liquids, dysphagia with solids, heartburn, loss of appetite, vomiting, vomiting blood, weight loss, and  weight gain.        Denies anal fissure, black tarry stools, change in bowel habit, constipation, diarrhea, diverticulosis, fecal incontinence, heme positive stool, hemorrhoids, irritable bowel syndrome, jaundice, light color stool, liver problems, rectal bleeding, and  rectal pain.     Prior Medications Reviewed Using: List Brought by Patient  Updated Prior Medication List: CRESTOR 10 MG TABS (ROSUVASTATIN CALCIUM) Take 1 tablet by mouth at bedtime XANAX 0.5 MG TABS (ALPRAZOLAM) 1 po qhs MULTIVITAMINS   TABS (MULTIPLE VITAMIN) Take 1 tablet by mouth once a day ACIPHEX 20 MG TBEC (RABEPRAZOLE SODIUM) two times a day  Current  Allergies (reviewed today): ROCEPHIN  Past Medical History:    Reviewed history from 07/08/2007 and no changes required:       Hyperlipidemia       Osteopenia       Diverticulosis, colon       GERD       4-09.Marland Kitchenabdominal pain: EGD and CTs neg.Marland KitchenRx Bentyl (non ulcer dyspepsia       Anxiety       Diabetes mellitus, type II  Past Surgical History:    Reviewed history from 05/10/2006 and no changes required:       Cervical laminectomy       Cholecystectomy       Hysterectomy       RT SHOULDER SURGER       COLON RESECTION 1999       1/08 CT FOR SPLEEN LESION - STABLE   Family History:    Reviewed history from 06/14/2007 and no changes required:       n.c.       No FH of Colon Cancer:       Family History of Prostate Cancer:       Family History of Diabetes:        Pt has had melanoma       Sister /Clear cell carcinoma  Social History:    Reviewed history from 06/14/2007 and no changes required:       Married       2 boys/children  Patient is a former smoker.  30 yrs ago       Alcohol Use - yes/ socially       Patient gets regular exercise.    Review of Systems       The patient complains of back pain, hearing problems, and night sweats.     Vital Signs:  Patient Profile:   65 Years Old Female Height:     64 inches Weight:      121.50 pounds BMI:     20.93 Pulse rate:   80 / minute Pulse rhythm:   regular BP sitting:   120 / 60  (right arm)  Vitals Entered By: June McMurray CMA (October 25, 2007 10:17 AM)                  Physical Exam  She is a slightly ill appearing female  On abdominal exam she has moderate midepigastric and.  Local tenderness without guarding or rebound.  There are no abdominal masses organomegaly    Impression & Recommendations:  Problem # 1:  ABDOMINAL PAIN, EPIGASTRIC (ICD-789.06) Patient is in mild distress from abdominal pain.  Considerations include pancreatic pain or perhaps pain related to biliary tract obstruction.   Choledocholithiasis and biliary dyskinesia are possibilities.  This would be an atypical presentation for GERD in view of her high dose PPI therapy.  Recommendations #1 stat CBC, amylase, lipase and LFTs.  Upon evaluation of these results I will make further recommendations today Orders: TLB-CBC Platelet - w/Differential (85025-CBCD) TLB-Hepatic/Liver Function Pnl (80076-HEPATIC) TLB-Amylase (82150-AMYL) TLB-Lipase (83690-LIPASE)    Patient Instructions: 1)  CC Dr. Jola Baptist    Prescriptions: DARVOCET A500 100-500 MG TABS (PROPOXYPHENE N-APAP) 2 TABS Q4-6H PRN  #25 x 1   Entered by:   Merri Ray CMA   Authorized by:   Louis Meckel MD   Signed by:   Merri Ray CMA on 10/25/2007   Method used:   Print then Give to Patient   RxID:   [id number] DARVOCET A500 100-500 MG TABS (PROPOXYPHENE N-APAP) 2 TABS Q4-6H PRN  #25 x 2   Entered and Authorized by:   Louis Meckel MD   Signed by:   Louis Meckel MD on 10/25/2007   Method used:   Historical   RxID:   [id number]  ]

## 2010-02-18 NOTE — Assessment & Plan Note (Signed)
Summary: CPX/YEARLY//PH   Vital Signs:  Patient Profile:   65 Years Old Female Height:     64.5 inches Weight:      125.4 pounds BMI:     21.27 Pulse rate:   76 / minute Pulse rhythm:   regular BP sitting:   110 / 70  (left arm) Cuff size:   regular  Vitals Entered By: Shary Decamp (March 07, 2008 9:23 AM)                 PCP:  Willow Ora MD  Chief Complaint:  yearly - GYN is Dr. Vincente Poli; fasting; pt thinks she might have another UTI.  History of Present Illness: yearly ?UTI-- was dx w/ UTI, had no symptoms ; wonders if she will have that problem on-off  DM II-- per Dr Talmage Nap  Hyperlipidemia-- good medication compliance , needs RF abdominal pain, last GI note reviewed Anxiety-- well control solar lentigo-- PMH updated , see PMH Hypothyroidism  ----> PMH updated, off thyroid meds     Updated Prior Medication List: CRESTOR 10 MG TABS (ROSUVASTATIN CALCIUM) Take 1 tablet by mouth at bedtime XANAX 0.5 MG TABS (ALPRAZOLAM) 1 po qhs MULTIVITAMINS   TABS (MULTIPLE VITAMIN) Take 1 tablet by mouth once a day AMITRIPTYLINE HCL 25 MG TABS (AMITRIPTYLINE HCL) 1/2 by mouth qd * CRANBERRY PILLS   Current Allergies (reviewed today): ROCEPHIN  Past Medical History:    DM II    Hyperlipidemia    Osteopenia    Diverticulosis, colon    GERD    4-09.Marland Kitchenabdominal pain: EGD and CTs neg.Marland KitchenRx amytriptiline  (non ulcer dyspepsia)    Anxiety    solar lentigo, face ; s/p excision 3-09. Sees Dermatology q 6 months     Hypothyroidism  Dx 10-9 TSH 5.2 , then re-tested by Dr Talmage Nap and told ok      Past Surgical History:    Cervical laminectomy    Cholecystectomy    Hysterectomy    RT SHOULDER SURGER    COLON RESECTION 1999 d/t diverticulitis ; Colonoscopy 12/01/07 :Tics, Dr Arlyce Dice    1/08 CT FOR SPLEEN LESION - STABLE; PTX,S/P chest tube   Family History:    Colon Cancer:no    Prostate Cancer--F    breast ca--no    Diabetes-- no    MI-- GF in his 67s     melanoma--uncle  Sister, endometrial Ca and Clear cell carcinoma  Social History:    Reviewed history from 01/09/2008 and no changes required:       Married       2 boys/children       Patient is a former smoker.  30 yrs ago       Alcohol Use - yes/ socially       Patient gets regular exercise.       Occupation: Retired   Risk Factors: Tobacco use:  quit Alcohol use:  yes Exercise:  yes  Colonoscopy History:    Date of Last Colonoscopy:  12/01/2007   Review of Systems  CV      Denies chest pain or discomfort and swelling of feet.  Resp      Denies cough and shortness of breath.  GI      Denies abdominal pain, bloody stools, diarrhea, and nausea.  GU      Denies dysuria, hematuria, urinary frequency, and urinary hesitancy.   Physical Exam  General:     alert, well-developed, and well-nourished.   Neck:  no masses, no thyroid nodules or tenderness, and normal carotid upstroke.   Lungs:     normal respiratory effort, no intercostal retractions, no accessory muscle use, and normal breath sounds.   Heart:     normal rate, regular rhythm, and no murmur.   Abdomen:     soft, non-tender, no distention, and no masses.   Extremities:     no pretibial edema bilaterally  Psych:     Cognition and judgment appear intact. Alert and cooperative with normal attention span and concentration.     Impression & Recommendations:  Problem # 1:  WEIGHT LOSS (ICD-783.21) stable / better gaining weight   Problem # 2:  HEALTH SCREENING (ICD-V70.0) Td 05 had flu shot  H1N1declined  printed material provided regards shingles shot Gyn is Dr.  Marice Potter patient I'll let them check on MMG last Cscope 2009  Problem # 3:  UTI (ICD-599.0) h/o asx UTIs has microhematuria today, UCX pending if Cx neg ----> to urology if cx (-) ----> treat and recheck, if UTI persist will need referal as well  Problem # 4:  ANXIETY (ICD-300.00) stable  Her updated medication list for this problem  includes:    Xanax 0.5 Mg Tabs (Alprazolam) .[name]... 1 po qhs    Amitriptyline Hcl 25 Mg Tabs (Amitriptyline hcl) .[name]... 1/2 by mouth qd   Problem # 5:  HYPERLIPIDEMIA (ICD-272.4)  Her updated medication list for this problem includes:    Crestor 10 Mg Tabs (Rosuvastatin calcium) .[name]... Take 1 tablet by mouth at bedtime  Labs Reviewed: Chol: 174 (02/14/2007)   HDL: 80.1 (02/14/2007)   LDL: 83 (02/14/2007)   TG: 54 (02/14/2007) SGOT: 19 (10/25/2007)   SGPT: 19 (10/25/2007)  Orders: Venipuncture ([address]) TLB-ALT (SGPT) (84460-ALT) TLB-AST (SGOT) (84450-SGOT) TLB-Lipid Panel (80061-LIPID)   Problem # 6:  also has a knot at L foor 2-3 mm s.q. round mass by the base of L great toe , dorsal aspect likely ganglion cyst we agreed she will call her ortho for a check up  Problem # 7:  OSTEOPENIA (ICD-733.90) last dexa? this is f/u by gyn per patient  rec Ca and vit D Orders: TLB-BMP (Basic Metabolic Panel-BMET) (80048-METABOL) T-Vitamin D (25-Hydroxy) ([address]11)   Complete Medication List: 1)  Crestor 10 Mg Tabs (Rosuvastatin calcium) .... Take 1 tablet by mouth at bedtime 2)  Xanax 0.5 Mg Tabs (Alprazolam) .[name].. 1 po qhs 3)  Multivitamins Tabs (Multiple vitamin) .... Take 1 tablet by mouth once a day 4)  Amitriptyline Hcl 25 Mg Tabs (Amitriptyline hcl) .... 1/2 by mouth qd 5)  Cranberry Pills  6)  Calcium and Vit D Otc Daily   Other Orders: UA Dipstick w/o Micro (manual) (91478) T-Culture, Urine ([address]-13086) T-Urine Microscopic (57846-96295)   Patient Instructions: 1)  Please schedule a follow-up appointment in 6 months.   Prescriptions: CRESTOR 10 MG TABS (ROSUVASTATIN CALCIUM) Take 1 tablet by mouth at bedtime  #90 x 3   Entered and Authorized by:   Nolon Rod. Paz MD   Signed by:   Nolon Rod. Paz MD on 03/07/2008   Method used:   Print then Give to Patient   RxID:   [id number]   Laboratory Results   Urine Tests    Routine Urinalysis   Glucose: negative    (Normal Range: Negative) Bilirubin: negative   (Normal Range: Negative) Ketone: negative   (Normal Range: Negative) Spec. Gravity: 1.015   (Normal Range: 1.003-1.035) Blood: large   (Normal Range: Negative) pH: 5.0   (  Normal Range: 5.0-8.0) Protein: negative   (Normal Range: Negative) Urobilinogen: 0.2   (Normal Range: 0-1) Nitrite: negative   (Normal Range: Negative) Leukocyte Esterace: negative   (Normal Range: Negative)

## 2010-02-18 NOTE — Letter (Signed)
Summary: Results Follow up Letter  [facility]  [address] [place], [place] [address]   Phone: [phone]  Fax: [phone]    09/07/2008 MRN: [id number]  [facility] [address] [place], [place]  [address]  Dear Ms. Rackham,  The following are the results of your recent test(s):  Test         Result    Pap Smear:        Normal _____  Not Normal _____ Comments: ______________________________________________________ Cholesterol: LDL(Bad cholesterol):         Your goal is less than:         HDL (Good cholesterol):       Your goal is more than: Comments:  ______________________________________________________ Mammogram:        Normal _____  Not Normal _____ Comments:  ___________________________________________________________________ Hemoccult:        Normal _____  Not normal _______ Comments:    _____________________________________________________________________ Other Tests:  Attached is a copy of your lab results.  Your diabetes is stable.  Thyroid was normal.  Please continue to take over-the-counter vitamin d 1200 units daily.  Call me if you have any questions. Alena Bills [phone] ext [phone]

## 2010-02-18 NOTE — Progress Notes (Signed)
Summary: headaches  Phone Note Call from Patient Call back at Home Phone [phone]   Caller: Patient Reason for Call: Acute Illness Summary of Call: dr. Drue Novel patient has had a headache for the past week and the headache will not go away. she has taking tylenol and advil and either one will not make the headache go away. she would like to be seen this afternoon.  Initial call taken by: Charolette Child,  April 08, 2007 8:44 AM  Follow-up for Phone Call        spoke with pt who c/o headaches becoming more worse taking advil not helping Recommend [facility] for today pt agreed...................................................................Marland KitchenKandice Hams  April 08, 2007 9:36 AM  Follow-up by: Kandice Hams,  April 08, 2007 9:36 AM

## 2010-02-18 NOTE — Miscellaneous (Signed)
Summary: labs from [facility]    Clinical Lists Changes  Observations: Added new observation of MICROALB URN: 2.5 mg/L (06/27/2009 12:50) Added new observation of CALCIUM: 9.7 mg/dL (16/10/9602 54:09) Added new observation of HGBA1C: 6.2 % (06/27/2009 12:50) Added new observation of CREATININE: 0.86 mg/dL (81/19/1478 29:56) Added new observation of POTASSIUM: 5.0 mmol/L (06/27/2009 12:50)

## 2010-02-18 NOTE — Progress Notes (Signed)
  Phone Note Refill Request Message from:  [facility] fax [phone]  gabapentin 100mg   prior authorization [phone] ext [phone]   Initial call taken by: Barb Merino,  February 05, 2009 4:33 PM  Follow-up for Phone Call        per patient this needs to be sent to Dr Anne Hahn, he is giving her a lower dose, [facility] Follow-up by: Kandice Hams,  February 06, 2009 9:27 AM

## 2010-02-18 NOTE — Progress Notes (Signed)
Summary: neurontin  Phone Note Call from Patient Call back at Home Phone [phone]   Caller: Patient Reason for Call: Talk to Nurse Details for Reason: PATIENT LEFT MESSAGE ON VM:  - having problems with meds that were rx'd @ last ov (neurontin, vicodin) Summary of Call: [facility] for pt to return call Initial call taken by: Shary Decamp,  December 28, 2008 1:37 PM  Follow-up for Phone Call        Neuronitn has helped pain but causing her to "feel like she is in another world, I feel like I am walking backwards"  - pain has radiated to roof & now has blister on roof of mouth Follow-up by: Shary Decamp,  December 28, 2008 2:02 PM  Additional Follow-up for Phone Call Additional follow up Details #1::        --if unable to tolerate Neurontin, please discontinue it. She could try to increase Elavil from half tablet to two tablets at bedtime.  Elavil  may also help with her  pain. --her pain is going on for a while, I doubt the blisters represent shingles however if she notice more blisters in the next 24 hours she needs to start Valtrex 1000 mg one p.o. t.i.d. for one week. ( we can call the prescription in case she needs it during the weekend)  Additional Follow-up by: Jose E. Paz MD,  December 28, 2008 2:18 PM    Additional Follow-up for Phone Call Additional follow up Details #2::    discussed with pt Follow-up by: Shary Decamp,  December 28, 2008 2:30 PM  New/Updated Medications: VALTREX 1 GM TABS (VALACYCLOVIR HCL) three times a day x 7 days - please put rx on HOLD pt will call if she needs this over the weekend Prescriptions: VALTREX 1 GM TABS (VALACYCLOVIR HCL) three times a day x 7 days - please put rx on HOLD pt will call if she needs this over the weekend  #21 x 0   Entered by:   Shary Decamp   Authorized by:   Nolon Rod. Paz MD   Signed by:   Shary Decamp on 12/28/2008   Method used:   [facility]ed [facility] ...       [facility]  [place] [address] (retail)       [address]  [place], [place]  [address]       Ph: [phone] or [phone]       Fax: [phone]   RxID:   [id number]

## 2010-02-18 NOTE — Progress Notes (Signed)
Summary: CT results   Phone Note Call from Patient Call back at Home Phone [phone]   Caller: Patient Call For: KAPLAN Reason for Call: Talk to Nurse, Lab or Test Results Details for Reason: results Summary of Call: wondering if results are in from CT on Thurs? Initial call taken by: Guad[place]pe [facility],  October 31, 2007 2:03 PM  Follow-up for Phone Call        Pt. aware I will C/B after MD reviews and advises. Laureen Ochs LPN  October 31, 2007 2:09 PM  Follow-up by: Laureen Ochs LPN,  October 31, 2007 2:10 PM  Additional Follow-up for Phone Call Additional follow up Details #1::        See note routed to you. Additional Follow-up by: Louis Meckel MD,  October 31, 2007 4:41 PM      Appended Document: Med. Update Per Dr.Kaplan--Hyomax 0.375mg  two times a day X 5 days, then two times a day as needed. Pt. to callback if no improvement within the next week.   Clinical Lists Changes  Medications: Added new medication of HYOMAX-DT 0.375 MG CR-TABS (HYOSCYAMINE SULFATE) Take one by mouth twice a day for 5 days, then twice a day as needed for abd. cramping/pain - Signed Rx of HYOMAX-DT 0.375 MG CR-TABS (HYOSCYAMINE SULFATE) Take one by mouth twice a day for 5 days, then twice a day as needed for abd. cramping/pain;  #30 x 1;  Signed;  Entered by: Laureen Ochs LPN;  Authorized by: Louis Meckel MD;  Method used: [facility]  [place] [address], [place], [place]  [address], Ph: [phone] or [phone], Fax: [phone]    Prescriptions: HYOMAX-DT 0.375 MG CR-TABS (HYOSCYAMINE SULFATE) Take one by mouth twice a day for 5 days, then twice a day as needed for abd. cramping/pain  #30 x 1   Entered by:   Laureen Ochs LPN   Authorized by:   Louis Meckel MD   Signed by:   Laureen Ochs LPN on 24/40/1027   Method used:   [facility]        [facility]  [place] [address] [facility]       [address]       [place], [place]   [address]       Ph: [phone] or [phone]       Fax: [phone]   RxID:   [id number]

## 2010-02-18 NOTE — Progress Notes (Signed)
Summary: Refill Request  Phone Note Refill Request Message from:  [facility] Fax #: [phone]  Refills Requested: Medication #1:  CRESTOR 10 MG TABS Take 1 tablet by mouth at bedtime   Dosage confirmed as above?Dosage Confirmed   Supply Requested: 3 months Initial call taken by: Harold Barban,  March 15, 2009 8:24 AM    Prescriptions: CRESTOR 10 MG TABS (ROSUVASTATIN CALCIUM) Take 1 tablet by mouth at bedtime  #90 x 2   Entered by:   Kandice Hams   Authorized by:   Nolon Rod. Paz MD   Signed by:   Kandice Hams on 03/15/2009   Method used:   Printed then faxed to ...       [facility]       [address]       [place], [place]  [address]       Ph: [phone]       Fax: [phone]   RxID:   [id number]

## 2010-02-18 NOTE — Assessment & Plan Note (Signed)
Summary: ACUTE/UTI FREQUENCY,BURNING/ALR  Medications Added CIPRO 500 MG  TABS (CIPROFLOXACIN HCL) 1 by mouth two times a day PYRIDIUM 200 MG  TABS (PHENAZOPYRIDINE HCL) 1 by mouth three times a day x 2 days        Vital Signs:  Patient Profile:   65 Years Old Female Weight:      124.2 pounds Temp:     98.8 degrees F oral BP sitting:   130 / 80  Vitals Entered By: Shary Decamp (March 18, 2007 1:23 PM)                 Chief Complaint:  urinary freq, +urine odor, and urgency -- "dribbles" x yest.  History of Present Illness: x 1 day urinary freq,  +urine odor  urgency -- "dribbles" x yest    Current Allergies: ROCEPHIN  Past Medical History:    Reviewed history from 02/14/2007 and no changes required:       Diverticulosis, colon       Hyperlipidemia       Osteopenia       GERD       Anxiety       Diabetes mellitus, type II   Family History:    n.c.  Social History:    Married    Review of Systems  General      Denies chills and fever.  GI      Denies bloody stools, diarrhea, nausea, and vomiting.  GU      Denies hematuria.   Physical Exam  General:     alert and well-developed.   Abdomen:     soft, no hepatomegaly, and no splenomegaly.  Tender at lower abdomen B, no mass or rebound Msk:     no CVA tenderness    Impression & Recommendations:  Problem # 1:  UTI (ICD-599.0) no h/o recurrent UTIs see below Her updated medication list for this problem includes:    Cipro 500 Mg Tabs (Ciprofloxacin hcl) .[name]... 1 by mouth two times a day    Pyridium 200 Mg Tabs (Phenazopyridine hcl) .[name]... 1 by mouth three times a day x 2 days   Complete Medication List: 1)  Crestor 10 Mg Tabs (Rosuvastatin calcium) .... Take 1 tablet by mouth at bedtime 2)  Xanax 0.5 Mg Tabs (Alprazolam) .... 1/2 to 1 po qhs 3)  Cipro 500 Mg Tabs (Ciprofloxacin hcl) .[name].. 1 by mouth two times a day 4)  Pyridium 200 Mg Tabs (Phenazopyridine hcl) .[name].. 1 by mouth three  times a day x 2 days  Other Orders: UA Dipstick w/o Micro (manual) ([address]) T-Culture, Urine ([address]11) T-Urine Microscopic ([id number])   Patient Instructions: 1)  lots of fluids 2)  call if no better in a few days or if you get worse    Prescriptions: PYRIDIUM 200 MG  TABS (PHENAZOPYRIDINE HCL) 1 by mouth three times a day x 2 days  #6 x 0   Entered and Authorized by:   Nolon Rod. Paz MD   Signed by:   Nolon Rod. Paz MD on 03/18/2007   Method used:   Print then Give to Patient   RxID:   [id number] CIPRO 500 MG  TABS (CIPROFLOXACIN HCL) 1 by mouth two times a day  #10 x 0   Entered and Authorized by:   Nolon Rod. Paz MD   Signed by:   Nolon Rod. Paz MD on 03/18/2007   Method used:   Print then Give to Patient   RxID:  [id number]  ] Laboratory Results   Urine Tests    Routine Urinalysis   Glucose: negative   (Normal Range: Negative) Bilirubin: negative   (Normal Range: Negative) Ketone: negative   (Normal Range: Negative) Spec. Gravity: 1.010   (Normal Range: 1.003-1.035) Blood: small   (Normal Range: Negative) pH: 6.0   (Normal Range: 5.0-8.0) Protein: negative   (Normal Range: Negative) Urobilinogen: negative   (Normal Range: 0-1) Nitrite: negative   (Normal Range: Negative) Leukocyte Esterace: moderate   (Normal Range: Negative)    Comments: sent for cx/micro ..................................................................Marland KitchenShary Decamp  March 18, 2007 1:27 PM

## 2010-02-18 NOTE — Assessment & Plan Note (Signed)
Summary: roa 4 months.cbs   Vital Signs:  Patient Profile:   65 Years Old Female Height:     64 inches Weight:      122.2 pounds Pulse rate:   64 / minute BP sitting:   120 / 80  Vitals Entered By: Shary Decamp (November 07, 2007 8:59 AM)                 PCP:  Willow Ora MD  Chief Complaint:  rov - fasting.  History of Present Illness: ROV -patient shared w/ me her skin Bx report  --Dr Talmage Nap started thyroid medicine    Updated Prior Medication List: CRESTOR 10 MG TABS (ROSUVASTATIN CALCIUM) Take 1 tablet by mouth at bedtime XANAX 0.5 MG TABS (ALPRAZOLAM) 1 po qhs MULTIVITAMINS   TABS (MULTIPLE VITAMIN) Take 1 tablet by mouth once a day HYOMAX-DT 0.375 MG CR-TABS (HYOSCYAMINE SULFATE) Take one by mouth twice a day for 5 days, then twice a day as needed for abd. cramping/pain SYNTHROID 50 MCG TABS (LEVOTHYROXINE SODIUM) 1 by mouth once daily  Current Allergies (reviewed today): ROCEPHIN  Past Medical History:    Reviewed history from 07/08/2007 and no changes required:       DM II       Hyperlipidemia       Osteopenia       Diverticulosis, colon       GERD       4-09.Marland Kitchenabdominal pain: EGD and CTs neg.Marland KitchenRx Bentyl (non ulcer dyspepsia)       Anxiety       solar lentigo, face ; s/p excision 3-09       Hypothyroidism  Dx 10-9 TSH 5.2  Past Surgical History:    Reviewed history from 05/10/2006 and no changes required:       Cervical laminectomy       Cholecystectomy       Hysterectomy       RT SHOULDER SURGER       COLON RESECTION 1999       1/08 CT FOR SPLEEN LESION - STABLE     Review of Systems       2 weeks ago had another episode of abdominal pain, doing better w/  hyosciamine no N-V xanax helps,still occ restless at night, doing well /ok at daytime   Physical Exam  General:     alert and well-developed.   Lungs:     normal respiratory effort, no intercostal retractions, no accessory muscle use, and normal breath sounds.   Heart:     normal rate,  regular rhythm, and no murmur.   Psych:     not anxious appearing and not depressed appearing.      Impression & Recommendations:  Problem # 1:  HYPOTHYROIDISM (ICD-244.9) just diagnosed , treated by Dr Talmage Nap Her updated medication list for this problem includes:    Synthroid 50 Mcg Tabs (Levothyroxine sodium) .[name]... 1 by mouth once daily  Orders: Admin of Therapeutic Inj ([place] or [place]) ([address])   Problem # 2:  WEIGHT LOSS (ICD-783.21) Wt stable Orders: Admin of Therapeutic Inj ([place] or [place]) ([address])   Problem # 3:  DIABETES MELLITUS, TYPE II (ICD-250.00) per Dr Talmage Nap Labs Reviewed: HgBA1c: 6.2 (10/25/2007)   Creat: 1.0 (10/26/2007)   Microalbumin: 0.2 (11/11/2005)   Problem # 4:  ANXIETY (ICD-300.00) symptoms mostly at night we talked a bout SSRI , doesn't  feel she needs them at present Her updated medication list for this problem includes:  Xanax 0.5 Mg Tabs (Alprazolam) .[name]... 1 po qhs  Orders: Admin of Therapeutic Inj ([place] or [place]) ([address])   Complete Medication List: 1)  Crestor 10 Mg Tabs (Rosuvastatin calcium) .... Take 1 tablet by mouth at bedtime 2)  Xanax 0.5 Mg Tabs (Alprazolam) .[name].. 1 po qhs 3)  Multivitamins Tabs (Multiple vitamin) .... Take 1 tablet by mouth once a day 4)  Hyomax-dt 0.375 Mg Cr-tabs (Hyoscyamine sulfate) .... Take one by mouth twice a day for 5 days, then twice a day as needed for abd. cramping/pain 5)  Synthroid 50 Mcg Tabs (Levothyroxine sodium) .[name].. 1 by mouth once daily  Other Orders: Influenza Vaccine NON MCR ([address])   Patient Instructions: 1)  Please schedule a PHYSICAL  in 4 months, FASTING   Prescriptions: XANAX 0.5 MG TABS (ALPRAZOLAM) 1 po qhs  #90 x 1   Entered and Authorized by:   Elita Quick E. Paz MD   Signed by:   Nolon Rod. Paz MD on 11/07/2007   Method used:   Print then Give to Patient   RxID:   [id number]  ]  Influenza Vaccine    Vaccine Type: Fluvax Non-MCR    Site: left deltoid    Mfr: GlaxoSmithKline    Dose: 0.5  ml    Route: IM    Given by: Shary Decamp    Exp. Date: 07/18/2008    Lot #: [id number]  Flu Vaccine Consent Questions    Do you have a history of severe allergic reactions to this vaccine? no    Any prior history of allergic reactions to egg and/or gelatin? no    Do you have a sensitivity to the preservative Thimersol? no    Do you have a past history of Guillan-Barre Syndrome? no    Do you currently have an acute febrile illness? no    Have you ever had a severe reaction to latex? no    Vaccine information given and explained to patient? yes    Are you currently pregnant? no

## 2010-02-18 NOTE — Letter (Signed)
Summary: Results Letter  [facility]  [address] [place], [place] [address]   Phone: [phone]  Fax: [phone]        October 31, 2007 MRN: [id number]    [facility] [address] [place], [place]  [address]    Dear Ms. Fausto,  Your CT results did not show any remarkable findings. My nurse will be calling me in the medication for spasm that I would like you to try over the next 2 weeks.  If you do notsee any improvement with the medication over the next 3-4 days, be sure to call the office.  Should you have any further questions or immediate concers, feel free to contact me.  Sincerely,  Barbette Hair. Arlyce Dice, M.D., [facility]          Sincerely,  Louis Meckel MD  This letter has been electronically signed by your physician.  Appended Document: Results Letter Letter mailed to patient.

## 2010-02-18 NOTE — Progress Notes (Signed)
Summary: CONDITION UPDATE   Phone Note Call from Patient Call back at Home Phone [phone]   Caller: Patient Call For: Jaivon Vanbeek Reason for Call: Talk to Nurse Summary of Call: PT WAS TOLD TO CALL us BACK IN ABOUT 2 TO 3 WKS TO LET us KNOW HOW SHE IS DOING  Initial call taken by: Tawni Levy,  November 28, 2007 2:03 PM  Follow-up for Phone Call        Pt. took Hyomax 0.375mg  two times a day for 5 days, after that she took it daily-took for approx. 2 weeks. No improvement in mid/low abd. pain. She stopped Hyomax, still no change in pain, except pain has traveled to her rectum, now has pain/pressure when she sits. Cannot control stools now, occ. episodes of incontinence. Stools are like"a chocolate pudding'   Diet adjustments make no change in pain or stools. Uses immodium as needed. [facility] PLEASE ADVISE Follow-up by: Laureen Ochs LPN,  November 28, 2007 3:45 PM  Additional Follow-up for Phone Call Additional follow up Details #1::        Patient should be scheduled for colonoscopy Additional Follow-up by: Louis Meckel MD,  November 28, 2007 5:11 PM      Appended Document: Colonoscopy Pt. scheduled for a previsit today at 4pm and a Colonoscopy on 12-01-07 at 11:30am.   Clinical Lists Changes  Problems: Added new problem of ABDOMINAL PAIN, GENERALIZED (ICD-789.07) Added new problem of DIARRHEA (ICD-787.91) Orders: Added new Test order of Colonoscopy (Colon) - Signed

## 2010-02-18 NOTE — Letter (Signed)
Summary: trigeminal neuralgia , well-controlled, neurology  [facility]   Imported By: Lanelle Bal 06/22/2009 10:39:21  _____________________________________________________________________  External Attachment:    Type:   Image     Comment:   External Document

## 2010-02-18 NOTE — Progress Notes (Signed)
Summary: feeling better  Phone Note Call from Patient   Caller: Patient Reason for Call: Talk to Nurse Details for Reason: Patient left message on VM:  amitriptyline is helping - she is feeling better - did not have to get valtrex filled Initial call taken by: Shary Decamp,  December 31, 2008 1:46 PM

## 2010-02-18 NOTE — Letter (Signed)
Summary: Recall-Office Visit Letter  [facility]  [address] [place], [place] [address]   Phone: [phone]  Fax: [phone]      February 29, 2008 MRN: [id number]   [facility] [address] [place], [place]  [address]   Dear Ms. Stapleton,   According to our records, it is time for you to schedule a follow-up office visit with [place].   At your convenience, please call [phone] (option #2)to schedule an office visit. If you have any questions, concerns, or feel that this letter is in error, we would appreciate your call.   Sincerely,  Barbette Hair. Arlyce Dice M.D.   [facility] [phone]

## 2010-02-18 NOTE — Progress Notes (Signed)
Summary: CT scheduled   Phone Note Call from Patient Call back at Home Phone [phone]   Call For: Dr Arlyce Dice Reason for Call: Talk to Nurse Summary of Call: Was promised lab results last night- still awating c-b. Initial call taken by: Leanor Kail [facility],  October 26, 2007 9:29 AM  Follow-up for Phone Call        Per Dr.Kaplan--Labs normal. Needs CT abd. ASAP. Scheduled at [facility] on 10-27-07 at 2pm. Pt. will p/u instructions/contrast and have labs done, today. Pt. instructed to call back as needed.  Follow-up by: Laureen Ochs LPN,  October 26, 2007 11:32 AM

## 2010-02-18 NOTE — Progress Notes (Signed)
Summary: Chest X-Ray Results  Phone Note Outgoing Call   Call placed by: Shonna Chock,  January 17, 2008 4:52 PM Call placed to: Patient Summary of Call: Spoke with patient Re: Chest X-Ray: Good report; please continue Symbicort 80/4.5  two puffs every 12 hrs; gargle with water & swallow after use. Singulair 10 mg once daily . If hoarseness persists , take Prilosec OTC 1 pill 30 min before b'fast & eve  Patient ok'd information./[facility]  January 17, 2008 4:54 PM

## 2010-02-18 NOTE — Progress Notes (Signed)
  Phone Note Refill Request Message from:  Patient  Refills Requested: Medication #1:  XANAX 0.5 MG TABS 1 po qhs   Last Refilled: 08/28/2008   Notes: #90 WITH 1 REFILL  [facility] Next Appointment Scheduled: 02/2008 Initial call taken by: Shary Decamp,  February 20, 2009 12:53 PM    Prescriptions: Prudy Feeler 0.5 MG TABS (ALPRAZOLAM) 1 po qhs  #90 x 1   Entered by:   Shary Decamp   Authorized by:   Nolon Rod. Paz MD   Signed by:   Shary Decamp on 02/20/2009   Method used:   Printed then faxed to ...       [facility]       [address]       [place], [place]  [address]       Ph: [phone]       Fax: [phone]   RxID:   [id number]

## 2010-02-18 NOTE — Assessment & Plan Note (Signed)
Summary: FOR SINUS PROBLEM//PH   Vital Signs:  Patient profile:   65 year old female Menstrual status:  hysterectomy Weight:      127.2 pounds BMI:     21.57 O2 Sat:      94 % on Room air Temp:     98.1 degrees F oral Pulse rate:   82 / minute Pulse rhythm:   regular Resp:     12 per minute BP sitting:   128 / 70  (left arm) Cuff size:   regular  Vitals Entered By: Arman Filter (December 05, 2008 11:53 AM)  O2 Flow:  Room air  Primary Care Provider:  Willow Ora, MD  CC:  c/o cold.  History of Present Illness: Patient presents today with c/o L sided facial pain which has been present since September.  She went to see her dentist due to this cheek pain, and was referred to endontist who said was wisdom tooth.  She then  had a root canal and cap - however L cheek pain is unchanged.  Tried Claratin still in pain and pain and pressure moving from left cheek into ear and moving around head.  Since saturday she has been blowing nose and getting grey or yellow d/c sometimes mixed with blood.  Allergies: 1)  Rocephin  Review of Systems       Denies fever,  pain is worse in cheek with bending.    Physical Exam  General:  Well-developed,well-nourished,in no acute distress; alert,appropriate and cooperative throughout examination Head:  + maxillary sinus pressure on left Eyes:  No corneal or conjunctival inflammation noted. EOMI. Perrla. Funduscopic exam benign, without hemorrhages, exudates or papilledema. Vision grossly normal. Ears:  External ear exam shows no significant lesions or deformities.  Otoscopic examination reveals clear canals, tympanic membranes are intact bilaterally without bulging, retraction, inflammation or discharge. Hearing is grossly normal bilaterally. Lungs:  Normal respiratory effort, chest expands symmetrically. Lungs are clear to auscultation, no crackles or wheezes. Heart:  Normal rate and regular rhythm. S1 and S2 normal without gallop, murmur, click, rub or  other extra sounds.   Impression & Recommendations:  Problem # 1:  SINUSITIS, MAXILLARY, CHRONIC (ICD-473.0) had anaphylactic rxn to cephalasporin- will treat with Levaquin.  Due to long standing nature of patient's problems, if symptoms do not improve will need to consider a referral to ENT Her updated medication list for this problem includes:    Claritin-d 24 Hour 10-240 Mg Xr24h-tab (Loratadine-pseudoephedrine)    Levaquin 750 Mg Tabs (Levofloxacin) ..... One tablet by mouth daily for 5 days  Complete Medication List: 1)  Crestor 10 Mg Tabs (Rosuvastatin calcium) .... Take 1 tablet by mouth at bedtime 2)  Xanax 0.5 Mg Tabs (Alprazolam) .[name].. 1 po qhs 3)  Claritin-d 24 Hour 10-240 Mg Xr24h-tab (Loratadine-pseudoephedrine) 4)  Levaquin 750 Mg Tabs (Levofloxacin) .... One tablet by mouth daily for 5 days  Patient Instructions: 1)  Please call us if your symptoms do not improve in the next 3 days or if you develop fever.   Prescriptions: LEVAQUIN 750 MG TABS (LEVOFLOXACIN) one tablet by mouth daily for 5 days  #5 x 0   Entered and Authorized by:   Lemont Fillers FNP   Signed by:   Lemont Fillers FNP on 12/05/2008   Method used:   [facility]        [facility]  [place] [address]       [address] [place] [address]  [place], [place]  [address]       Ph: [phone] or [phone]       Fax: [phone]   RxID:   [id number]   Current Allergies (reviewed today): ROCEPHIN Updated/Current Medications (including changes made in today's visit):  CRESTOR 10 MG TABS (ROSUVASTATIN CALCIUM) Take 1 tablet by mouth at bedtime XANAX 0.5 MG TABS (ALPRAZOLAM) 1 po qhs CLARITIN-D 24 HOUR 10-240 MG XR24H-TAB (LORATADINE-PSEUDOEPHEDRINE)  LEVAQUIN 750 MG TABS (LEVOFLOXACIN) one tablet by mouth daily for 5 days

## 2010-02-18 NOTE — Assessment & Plan Note (Signed)
Summary: acute only cough.cbs   Vital Signs:  Patient Profile:   65 Years Old Female Height:     64 inches Weight:      121 pounds Temp:     98.0 degrees F oral BP sitting:   100 / 60  Vitals Entered By: Kandice Hams (December 05, 2007 12:28 PM)                .  PCP:  Willow Ora MD  Chief Complaint:  C/O COUGH X 1 WEEK and HOARSE CHEST CONGESTED.  History of Present Illness: Onset 1 week ago as ST; 12/02/07 onset of  dry cough. Rx: Robitussin w/o benefit.PMH of bronchitis, PTX & pleurisy. Smoking D/Ced age 19. Colonoscopy done 12/01/07: Tics.    Current Allergies: ROCEPHIN  Past Surgical History:    Cervical laminectomy    Cholecystectomy    Hysterectomy    RT SHOULDER SURGER    COLON RESECTION 1999; Colonoscopy 12/01/07 :Tics, Dr Arlyce Dice    1/08 CT FOR SPLEEN LESION - STABLE; PTX,S/P chest tube     Review of Systems  General      Complains of chills.      Denies fever and sweats.  ENT      Denies earache, nasal congestion, sinus pressure, and sore throat.      No purulence  Resp      Denies chest pain with inspiration, coughing up blood, pleuritic, shortness of breath, and wheezing.   Physical Exam  General:     in no acute distress; alert,appropriate and cooperative throughout examination Ears:     External ear exam shows no significant lesions or deformities.  Otoscopic examination reveals clear canals, tympanic membranes are intact bilaterally without bulging, retraction, inflammation or discharge. Aids bilat Nose:     External nasal examination shows no deformity or inflammation. Nasal mucosa are pink and moist without lesions or exudates. Mouth:     Oral mucosa and oropharynx without lesions or exudates.  Teeth in good repair. Hoarse Lungs:     Normal respiratory effort, chest expands symmetrically. Lungs are clear to auscultation, no crackles or wheezes.Decreased BS Cervical Nodes:     No lymphadenopathy noted    Impression &  Recommendations:  Problem # 1:  BRONCHITIS-ACUTE (ICD-466.0)  Her updated medication list for this problem includes:    Azithromycin 250 Mg Tabs (Azithromycin) .[name]... As per pack    Promethazine-codeine 6.25-10 Mg/32ml Syrp (Promethazine-codeine) .[name]... 1 tsp q 6-8 hrs as needed for cough   Complete Medication List: 1)  Crestor 10 Mg Tabs (Rosuvastatin calcium) .... Take 1 tablet by mouth at bedtime 2)  Xanax 0.5 Mg Tabs (Alprazolam) .[name].. 1 po qhs 3)  Multivitamins Tabs (Multiple vitamin) .... Take 1 tablet by mouth once a day 4)  Synthroid 50 Mcg Tabs (Levothyroxine sodium) .[name].. 1 by mouth once daily 5)  Amitriptyline Hcl 25 Mg Tabs (Amitriptyline hcl) .[name].. 1 tab before retiring for 7 days then increase to 2 tabs 6)  Azithromycin 250 Mg Tabs (Azithromycin) .... As per pack 7)  Promethazine-codeine 6.25-10 Mg/76ml Syrp (Promethazine-codeine) .[name].. 1 tsp q 6-8 hrs as needed for cough   Patient Instructions: 1)  Drink as much fluid as you can tolerate for the next few days.   Prescriptions: PROMETHAZINE-CODEINE 6.25-10 MG/5ML SYRP (PROMETHAZINE-CODEINE) 1 tsp q 6-8 hrs as needed for cough  #120cc x 0   Entered and Authorized by:   Marga Melnick MD   Signed by:   Marga Melnick MD on  12/05/2007   Method used:   Print then Give to Patient   RxID:   [id number] AZITHROMYCIN 250 MG TABS (AZITHROMYCIN) as per pack  #1 pack x 0   Entered and Authorized by:   Marga Melnick MD   Signed by:   Marga Melnick MD on 12/05/2007   Method used:   [facility]        [facility]  [place] [address]       [address]       [place], [place]  [address]       Ph: [phone] or [phone]       Fax: [phone]   RxID:   [id number]  ]

## 2010-02-18 NOTE — Letter (Signed)
Summary: Dr Noel Gerold rec local injections for neck pain  [facility]   Imported By: Freddy Jaksch 06/24/2007 09:57:22  _____________________________________________________________________  External Attachment:    Type:   Image     Comment:   External Document

## 2010-02-18 NOTE — Progress Notes (Signed)
Summary: UTI   Phone Note Call from Patient Call back at Home Phone [phone]   Caller: Patient Reason for Call: Acute Illness Summary of Call: dr. Drue Novel when patient goes to the restroom it burns, she goes to more often, at times when she uses the restroom she will flow well and then at other times her urine will drip out.  Initial call taken by: Charolette Child,  March 18, 2007 8:49 AM  Follow-up for Phone Call        spoke with pt who c/o frequency or urination with burning up throughout the night ov sched today ok per stacia...................................................................Marland KitchenKandice Hams  March 18, 2007 9:42 AM  Follow-up by: Kandice Hams,  March 18, 2007 9:42 AM

## 2010-02-18 NOTE — Assessment & Plan Note (Signed)
Summary: YEARLY//PH   Vital Signs:  Patient profile:   65 year old female Menstrual status:  hysterectomy Height:      64.5 inches Weight:      128.8 pounds BMI:     21.85 Pulse rate:   74 / minute BP sitting:   110 / 60  Vitals Entered By: Shary Decamp (March 13, 2009 8:08 AM) CC: cpx - fasting Is Patient Diabetic? No   History of Present Illness: DM II-- ambulatory CBGs in the 120s, rarely 160   Hyperlipidemia-- good medication compliance, no s/e   GERD-- asx "since starting amytriptiline"  Anxiety-- main problem was insomnia, xanax helps    yearly, chart reviewed   saw neuro, dx w/ trig. neuralgia , on neurontin two times a day   Preventive Screening-Counseling & Management  Alcohol-Tobacco     Alcohol type: wine  Caffeine-Diet-Exercise     Caffeine use/day: 0     Times/week: 4  Current Medications (verified): 1)  Crestor 10 Mg Tabs (Rosuvastatin Calcium) .... Take 1 Tablet By Mouth At Bedtime 2)  Xanax 0.5 Mg Tabs (Alprazolam) .[name].. 1 Po Qhs 3)  Amitriptyline Hcl 25 Mg Tabs (Amitriptyline Hcl) .... 1/2 By Mouth Qhs 4)  Neurontin 100 Mg Caps (Gabapentin) .... 2 By Mouth Once Daily  Allergies (verified): 1)  Rocephin  Past History:  Past Medical History: DM II Hyperlipidemia Osteopenia Diverticulosis, colon GERD 4-09.Marland Kitchenabdominal pain: EGD and CTs neg.Marland KitchenRx amytriptiline  (non ulcer dyspepsia) Anxiety solar lentigo, face ; s/p excision 3-09. Sees Dermatology q 6 months  Hypothyroidism  Dx 10-9 TSH 5.2 , then re-tested by Dr Talmage Nap and told ok  Past Surgical History: Reviewed history from 03/07/2008 and no changes required. Cervical laminectomy Cholecystectomy Hysterectomy RT SHOULDER SURGER COLON RESECTION 1999 d/t diverticulitis ; Colonoscopy 12/01/07 :Tics, Dr Arlyce Dice 1/08 CT FOR SPLEEN LESION - STABLE; PTX,S/P chest tube  Family History: Reviewed history from 03/07/2008 and no changes required. Colon Cancer:no Prostate Cancer--F breast  ca--no Diabetes-- no MI-- GF in his 54s  melanoma--uncle Sister, endometrial Ca and Clear cell carcinoma  Social History: Reviewed history from 04/12/2008 and no changes required. Married 2 boys/children Patient is a former smoker.  30 yrs ago Alcohol Use - yes/ socially Patient gets regular exercise, x3/week diet-- does watch  Occupation: Retired Illicit Drug Use - no Caffeine use/day:  0  Review of Systems CV:  Denies chest pain or discomfort and swelling of feet. Resp:  Denies cough and shortness of breath. GI:  Denies bloody stools, diarrhea, nausea, and vomiting.  Physical Exam  General:  alert, well-developed, and well-nourished.   Neck:  no masses, no thyromegaly, and normal carotid upstroke.   Lungs:  normal respiratory effort, no intercostal retractions, no accessory muscle use, and normal breath sounds.   Heart:  normal rate, regular rhythm, and no murmur.   Abdomen:  soft, non-tender, no distention, no masses, no guarding, and no rigidity.   Extremities:  no edema Psych:  Cognition and judgment appear intact. Alert and cooperative with normal attention span and concentration.  not anxious appearing and not depressed appearing.     Impression & Recommendations:  Problem # 1:  NEURALGIA, TRIGEMINAL (ICD-350.1) well controlled with Neurontin  Problem # 2:  HEALTH SCREENING (ICD-V70.0) Td 05 had no flu shot    Gyn is Dr.  Eustaquio Boyden, last OV 04-2009 last mammogram 2010 per patient  last Cscope 2009 doing great, continue with healthy lifestyle  Problem # 3:  DIABETES MELLITUS, TYPE II (ICD-250.00)  continue with diet and exercise  Labs Reviewed: Creat: 0.9 (03/07/2008)    Reviewed HgBA1c results: 6.4 (09/04/2008)  6.2 (10/25/2007)  Problem # 4:  ANXIETY (ICD-300.00) main problem was insomnia, symptoms well controlled with Xanax Her updated medication list for this problem includes:    Xanax 0.5 Mg Tabs (Alprazolam) .[name]... 1 po qhs    Amitriptyline Hcl 25  Mg Tabs (Amitriptyline hcl) .[name]... 1/2 by mouth qhs  Problem # 5:  HYPERLIPIDEMIA (ICD-272.4) labs Her updated medication list for this problem includes:    Crestor 10 Mg Tabs (Rosuvastatin calcium) .[name]... Take 1 tablet by mouth at bedtime  Labs Reviewed: SGOT: 24 (03/07/2008)   SGPT: 20 (03/07/2008)   HDL:88.2 (03/07/2008), 80.1 (02/14/2007)  LDL:85 (03/07/2008), 83 (02/14/2007)  Chol:184 (03/07/2008), 174 (02/14/2007)  Trig:56 (03/07/2008), 54 (02/14/2007)  Orders: TLB-Lipid Panel (80061-LIPID) TLB-ALT (SGPT) (84460-ALT) TLB-AST (SGOT) (84450-SGOT)  Problem # 6:  GERD (ICD-530.81) symptoms well controlled since she started Elavil  Problem # 7:  OSTEOPENIA (ICD-733.90) not on Vit D supplements, advised to do bone density test per gynecology unable to take calcium supplements, recommend a  calcium rich diet  Orders: T-Vitamin D (25-Hydroxy) (32440-10272) Venipuncture (53664)  Complete Medication List: 1)  Crestor 10 Mg Tabs (Rosuvastatin calcium) .... Take 1 tablet by mouth at bedtime 2)  Xanax 0.5 Mg Tabs (Alprazolam) .[name].. 1 po qhs 3)  Amitriptyline Hcl 25 Mg Tabs (Amitriptyline hcl) .... 1/2 by mouth qhs 4)  Neurontin 100 Mg Caps (Gabapentin) .... 2 by mouth once daily  Other Orders: TLB-A1C / Hgb A1C (Glycohemoglobin) (83036-A1C) TLB-BMP (Basic Metabolic Panel-BMET) (80048-METABOL)  Patient Instructions: 1)  Please schedule a follow-up appointment in 6 months .  Prescriptions: CRESTOR 10 MG TABS (ROSUVASTATIN CALCIUM) Take 1 tablet by mouth at bedtime  #90 x 2   Entered by:   Shary Decamp   Authorized by:   Nolon Rod. Paz MD   Signed by:   Shary Decamp on 03/13/2009   Method used:   [facility] to        [facility]       [address]       [place], [place]  [address]       Ph: [phone]       Fax: [phone]   RxID:   [id number]    Preventive Care Screening  Prior Values:    Colonoscopy:  Location:  [facility].   (12/01/2007)    Last Tetanus Booster:  Td (12/21/2003)    Last Flu Shot:  Fluvax Non-MCR (11/07/2007)    Risk Factors:  Tobacco use:  quit Drug use:  no Caffeine use:  0 drinks per day Alcohol use:  yes    Type:  wine Exercise:  yes    Times per week:  4  Colonoscopy History:    Date of Last Colonoscopy:  12/01/2007

## 2010-02-18 NOTE — Assessment & Plan Note (Signed)
Summary: sore roof of mouth/cbs--Rm 17   Vital Signs:  Patient profile:   65 year old female Menstrual status:  hysterectomy Height:      64.5 inches Weight:      128.05 pounds BMI:     21.72 Temp:     98.2 degrees F oral Pulse rate:   78 / minute Pulse rhythm:   regular Resp:     16 per minute BP sitting:   140 / 80  (left arm) Cuff size:   regular  Vitals Entered By: Mervin Kung CMA Duncan Dull) (August 29, 2009 2:06 PM) CC: Room 17  Pt states she has a sore in the roof of her mouth x 1 week. Gum feels sore on the top left side. Is Patient Diabetic? Yes Comments Neurontin has now increased to 2 in the am, 1 pm and 2 at bedtime. Nicki Guadalajara Fergerson CMA (AAMA)  August 29, 2009 2:09 PM    History of Present Illness: 65 yo woman here today for sore on roof of mouth.  6 months ago saw Dr Anne Hahn for 'terrible pain' along my gum line- ? trigeminal neuralgia.  15 yrs ago had skin graft from roof of mouth.  1 week ago started w/ throbbing pain and feels there is a sore on the roof of the mouth.  started Valtrex 2.5 days ago.  feels sxs are worsening.  very painful when tongue hits roof of mouth- interfering w/ eating.  Current Medications (verified): 1)  Crestor 10 Mg Tabs (Rosuvastatin Calcium) .... Take 1 Tablet By Mouth At Bedtime 2)  Xanax 0.5 Mg Tabs (Alprazolam) .[name].. 1 Po Qhs 3)  Amitriptyline Hcl 25 Mg Tabs (Amitriptyline Hcl) .... 1/2 By Mouth Qhs 4)  Neurontin 100 Mg Caps (Gabapentin) .... 2 By Mouth Once Daily 5)  Lidocaine Viscous 2 % Soln (Lidocaine Hcl) .... Apply To Affected Area Every 3-4 Hours As Needed For Pain.  Use Qtip To Apply  Allergies: 1)  Rocephin  Review of Systems      See HPI  Physical Exam  General:  alert, well-developed, and well-nourished.   Mouth:  small apthous appearing ulcer just behind L upper front tooth, no erythema or edema.   Impression & Recommendations:  Problem # 1:  APHTHOUS ULCERS (ICD-528.2) Assessment New pt's sore appears  consistent w/ apthous ulcer.  no evidence of infection.  start viscous lidocaine for pain relief.  reviewed supportive care and red flags that should prompt return.  Pt expresses understanding and is in agreement w/ this plan.  Complete Medication List: 1)  Crestor 10 Mg Tabs (Rosuvastatin calcium) .... Take 1 tablet by mouth at bedtime 2)  Xanax 0.5 Mg Tabs (Alprazolam) .[name].. 1 po qhs 3)  Amitriptyline Hcl 25 Mg Tabs (Amitriptyline hcl) .... 1/2 by mouth qhs 4)  Neurontin 100 Mg Caps (Gabapentin) .... 2 by mouth once daily 5)  Lidocaine Viscous 2 % Soln (Lidocaine hcl) .... Apply to affected area every 3-4 hours as needed for pain.  use qtip to apply  Patient Instructions: 1)  There appears to be a small ulcer causing your pain 2)  Use the lidocaine for pain relief- apply with a qtip 3)  This should improve w/ time 4)  Hang in there! Prescriptions: LIDOCAINE VISCOUS 2 % SOLN (LIDOCAINE HCL) apply to affected area every 3-4 hours as needed for pain.  use qtip to apply  #179ml x 0   Entered and Authorized by:   Neena Rhymes MD   Signed by:  Neena Rhymes MD on 08/29/2009   Method used:   [facility]        [facility]  [place] [address]       [address]       [place], [place]  [address]       Ph: [phone] or [phone]       Fax: [phone]   RxID:   [id number]   Current Allergies (reviewed today): ROCEPHINrm

## 2010-02-18 NOTE — Assessment & Plan Note (Signed)
Summary: pt trying to come off xanax//alj  Medications Added XANAX 0.5 MG TABS (ALPRAZOLAM) 1/2 to 1 po qhs        Vital Signs:  Patient Profile:   65 Years Old Female Weight:      122 pounds Pulse rate:   60 / minute BP sitting:   118 / 80  Vitals Entered By: Shary Decamp (February 14, 2007 8:47 AM)                 Chief Complaint:  pt tried to d/c xanax - doesn't feel good and not sleeping.  History of Present Illness: last OV 3-08 was Rx xanax 0.5 three times a day for anxiety related to her health issues She is doing well, a lot less anxious gradually self- decrease xanax to 1/2 at bedtime, then stopped once she stopped she had a hard time sleeping-was anxious the next day  Current Allergies (reviewed today): ROCEPHIN  Past Medical History:    Diverticulosis, colon    Hyperlipidemia    Osteopenia    GERD    Anxiety    Diabetes mellitus, type II     Review of Systems       other than below, ROS  is negative   CV      Denies chest pain or discomfort.  Resp      Denies shortness of breath.  Psych      Denies anxiety and depression.   Physical Exam  General:     alert, well-developed, and well-nourished.   Lungs:     normal respiratory effort, no intercostal retractions, no accessory muscle use, and normal breath sounds.   Heart:     normal rate, regular rhythm, and no murmur.   Extremities:     no edema    Impression & Recommendations:  Problem # 1:  ANXIETY (ICD-300.00) anxiety and insomnia I don't belive pt is "addicted" to xanax, she had a difficult time w/ insomnia all her life and now she realize xanax does help w/ insomnia (and w/ mild anxiety) Rec: cont w/ xanax Her updated medication list for this problem includes:    Xanax 0.5 Mg Tabs (Alprazolam) .[name]... 1/2 to 1 po qhs   Problem # 2:  HYPERLIPIDEMIA (ICD-272.4)  Her updated medication list for this problem includes:    Crestor 10 Mg Tabs (Rosuvastatin calcium) .[name]... Take 1  tablet by mouth at bedtime  Labs Reviewed: Chol: 241 (06/11/2006)   HDL: 63.0 (06/11/2006)   LDL: DEL (06/11/2006)   TG: 113 (06/11/2006) SGOT: 18 (06/11/2006)   SGPT: 16 (06/11/2006)  Orders: TLB-Lipid Panel (80061-LIPID) TLB-ALT (SGPT) (84460-ALT) TLB-AST (SGOT) (84450-SGOT) TLB-BMP (Basic Metabolic Panel-BMET) (80048-METABOL)   Problem # 3:  DIABETES MELLITUS, TYPE II (ICD-250.00) had Wt loss last year saw endocrine, had acomplete w/u finally Dx w/ borderline DM advice to "eat more" per pt Orders: TLB-A1C / Hgb A1C (Glycohemoglobin) (83036-A1C) Venipuncture (21308)   Problem # 4:  HEALTH SCREENING (ICD-V70.0) due for a CPX sees gyn  Complete Medication List: 1)  Crestor 10 Mg Tabs (Rosuvastatin calcium) .... Take 1 tablet by mouth at bedtime 2)  Xanax 0.5 Mg Tabs (Alprazolam) .... 1/2 to 1 po qhs   Patient Instructions: 1)  Please schedule a follow-up appointment in 4 months    Prescriptions: XANAX 0.5 MG TABS (ALPRAZOLAM) 1/2 to 1 po qhs  #30 x 0   Entered and Authorized by:   Nolon Rod. Paz MD   Signed by:   Elita Quick  Elisabeth Cara MD on 02/14/2007   Method used:   Print then Give to Patient   RxID:   [id number] CRESTOR 10 MG TABS (ROSUVASTATIN CALCIUM) Take 1 tablet by mouth at bedtime  #90 x 3   Entered and Authorized by:   Nolon Rod. Paz MD   Signed by:   Nolon Rod. Paz MD on 02/14/2007   Method used:   Print then Give to Patient   RxID:   [id number] XANAX 0.5 MG TABS (ALPRAZOLAM) 1/2 to 1 po qhs  #90 x 3   Entered and Authorized by:   Nolon Rod. Paz MD   Signed by:   Nolon Rod. Paz MD on 02/14/2007   Method used:   Print then Give to Patient   RxID:   [id number]  ]

## 2010-02-18 NOTE — Letter (Signed)
Summary: endocrinology----[facility]  [facility]   Imported By: Lanelle Bal 01/16/2008 08:25:55  _____________________________________________________________________  External Attachment:    Type:   Image     Comment:   External Document

## 2010-02-18 NOTE — Assessment & Plan Note (Signed)
Summary: rov per dr Jacklyn Shell    History of Present Illness Visit Type: follow up Primary GI MD: Melvia Heaps MD [facility] Primary Provider: Willow Ora MD Requesting Provider: B. Romero Belling, MD Chief Complaint: follow-up visit/colonoscopy History of Present Illness:   [name]. return for followup of her abdominal pain.  She was originally placed on Elavil 25 mg q.h.s.  Because of side effects including weakness and excessive fatigue this was lowered to 12.5 mg q.h.s.  On this regimen she has had no further episodes of pain.  She is well rested, gained 5 pounds altogether feels better than she has been months.   GI Review of Systems      Denies abdominal pain, acid reflux, belching, bloating, chest pain, dysphagia with liquids, dysphagia with solids, heartburn, loss of appetite, nausea, vomiting, vomiting blood, weight loss, and  weight gain.        Denies anal fissure, black tarry stools, change in bowel habit, constipation, diarrhea, diverticulosis, fecal incontinence, heme positive stool, hemorrhoids, irritable bowel syndrome, jaundice, light color stool, liver problems, rectal bleeding, and  rectal pain.     Prior Medications Reviewed Using: Patient Recall  Updated Prior Medication List: CRESTOR 10 MG TABS (ROSUVASTATIN CALCIUM) Take 1 tablet by mouth at bedtime XANAX 0.5 MG TABS (ALPRAZOLAM) 1 po qhs MULTIVITAMINS   TABS (MULTIPLE VITAMIN) Take 1 tablet by mouth once a day AMITRIPTYLINE HCL 25 MG TABS (AMITRIPTYLINE HCL) 1 tab before retiring for 7 days then increase to 2 tabs  Current Allergies (reviewed today): ROCEPHIN  Past Medical History:    Reviewed history from 11/07/2007 and no changes required:       DM II       Hyperlipidemia       Osteopenia       Diverticulosis, colon       GERD       4-09.Marland Kitchenabdominal pain: EGD and CTs neg.Marland KitchenRx Bentyl (non ulcer dyspepsia)       Anxiety       solar lentigo, face ; s/p excision 3-09       Hypothyroidism  Dx 10-9 TSH 5.2  Past  Surgical History:    Reviewed history from 12/05/2007 and no changes required:       Cervical laminectomy       Cholecystectomy       Hysterectomy       RT SHOULDER SURGER       COLON RESECTION 1999; Colonoscopy 12/01/07 :Tics, Dr Arlyce Dice       1/08 CT FOR SPLEEN LESION - STABLE; PTX,S/P chest tube   Family History:    Reviewed history from 06/14/2007 and no changes required:       n.c.       No FH of Colon Cancer:       Family History of Prostate Cancer:       Family History of Diabetes:        Pt has had melanoma       Sister /Clear cell carcinoma  Social History:    Reviewed history from 06/14/2007 and no changes required:       Married       2 boys/children       Patient is a former smoker.  30 yrs ago       Alcohol Use - yes/ socially       Patient gets regular exercise.       Occupation: Retired    Review of Systems  The patient complains of night sweats and sleeping problems.     Vital Signs:  Patient Profile:   65 Years Old Female Height:     64 inches Weight:      125.13 pounds BMI:     21.56 Pulse rate:   68 / minute Pulse rhythm:   regular BP sitting:   108 / 70  (right arm)  Vitals Entered By: June McMurray CMA (January 09, 2008 8:46 AM)                    Impression & Recommendations:  Problem # 1:  ABDOMINAL PAIN, GENERALIZED (ICD-789.07) Assessment: Improved Etiology for her pain was not determined.  She has had an excellent response to very low dose amitriptyline.  Recommendations continue with amitriptyline  12.5-25 mg q.h.s.   Patient Instructions: 1)  cc Dr. Jola Baptist    ]

## 2010-02-18 NOTE — Progress Notes (Signed)
Summary: lab results  Phone Note Outgoing Call Call back at [facility] Phone [phone]   Summary of Call: LAB RESULT:  - UA neg - U cx neg ---> no further testing @ this time - vit D ok - cont Vit D supplements - cholesterol well controlled - all other labs ok Left message on machine to return call copy of labs mailed to pt ........Marland KitchenShary Decamp  March 12, 2008 1:42 PM   Follow-up for Phone Call        patient aware .Shary Decamp  March 12, 2008 3:00 PM

## 2010-02-18 NOTE — Consult Note (Signed)
Summary: [facility]--trig. neuralgia  [facility]   Imported By: Lanelle Bal 02/08/2009 12:01:41  _____________________________________________________________________  External Attachment:    Type:   Image     Comment:   External Document

## 2010-02-18 NOTE — Progress Notes (Signed)
Summary: lab results  Phone Note Outgoing Call   Details for Reason: advised patient chest x-ray normal. CBC and LFTs normal, as well Signed by [facility]. Paz MD on 07/11/2007 at 3:59 PM Summary of Call: letter & copy of labs mailed to patient...............Marland KitchenShary Decamp  July 11, 2007 4:55 PM

## 2010-02-18 NOTE — Assessment & Plan Note (Signed)
Summary: 6 month roa//lch   Vital Signs:  Patient profile:   65 year old female Menstrual status:  hysterectomy Weight:      127.50 pounds Pulse rate:   92 / minute Pulse rhythm:   regular BP sitting:   116 / 70  (left arm) Cuff size:   regular  Vitals Entered By: Army Fossa CMA (September 11, 2009 8:36 AM) CC: 6 month f/u: fasting    History of Present Illness: trigeminal neuralgia, pt still worried about the dx, recently seen w/ aphtous ulcers. Issue was discussed    DM-- brought labs from endocrinology for my review dated  6-11  A1C 6.2 , BMP wnl   Hyperlipidemia-- on crestor, good medication compliance    Anxiety-- main symptoms is insomnia, symptoms  are not as well controlled lately, "worry about everything"  Hypothyroidism  -- brought labs from 6-11  TSH 5.0   Current Medications (verified): 1)  Crestor 10 Mg Tabs (Rosuvastatin Calcium) .... Take 1 Tablet By Mouth At Bedtime 2)  Xanax 0.5 Mg Tabs (Alprazolam) .[name].. 1 Po Qhs 3)  Amitriptyline Hcl 25 Mg Tabs (Amitriptyline Hcl) .... 1/2 By Mouth Qhs 4)  Neurontin 100 Mg Caps (Gabapentin) .... 2 By Mouth Once Daily  Allergies: 1)  Rocephin  Past History:  Past Medical History: DM II Hyperlipidemia Osteopenia Diverticulosis, colon GERD 4-09.Marland Kitchenabdominal pain: EGD and CTs neg.Marland KitchenRx amytriptiline  (non ulcer dyspepsia) Anxiety solar lentigo, face ; s/p excision 3-09. Sees Dermatology q 6 months  Hypothyroidism  Dx 10-9 TSH 5.2 , then re-tested by Dr Talmage Nap and told ok Trigeminal Neuralgia   Past Surgical History: Reviewed history from 03/07/2008 and no changes required. Cervical laminectomy Cholecystectomy Hysterectomy RT SHOULDER SURGER COLON RESECTION 1999 d/t diverticulitis ; Colonoscopy 12/01/07 :Tics, Dr Arlyce Dice 1/08 CT FOR SPLEEN LESION - STABLE; PTX,S/P chest tube  Social History: Reviewed history from 03/13/2009 and no changes required. Married 2 boys/children Patient is a former smoker.  30 yrs  ago Alcohol Use - yes/ socially Patient gets regular exercise, x3/week diet-- does watch  Occupation: Retired Illicit Drug Use - no  Review of Systems General:  eats healthy, exercise . Resp:  Denies cough and shortness of breath. GI:  Denies bloody stools, nausea, and vomiting; no GERD symptoms, no dysphagia . Psych:  Denies depression, easily tearful, and irritability.  Physical Exam  General:  alert, well-developed, and well-nourished.   Neck:  no masses and no thyromegaly.   Lungs:  normal respiratory effort, no intercostal retractions, no accessory muscle use, and normal breath sounds.   Heart:  normal rate, regular rhythm, no murmur, and no gallop.   Extremities:  no edema Psych:  Cognition and judgment appear intact. Alert and cooperative with normal attention span and concentration.  not anxious appearing and not depressed appearing.     Impression & Recommendations:  Problem # 1:  DIABETES MELLITUS, TYPE II (ICD-250.00) hemoglobin A1c 6.2 on June 2011    Labs Reviewed: Creat: 0.8 (03/13/2009)    Reviewed HgBA1c results: 6.4 (03/13/2009)  6.4 (09/04/2008)  Problem # 2:  ANXIETY (ICD-300.00) Main symptom of anxiety is insomnia, lately has not been able to sleep as well as before Plan: Increase amitriptyline to one tablet daily  Her updated medication list for this problem includes:    Xanax 0.5 Mg Tabs (Alprazolam) .[name]... 1 po qhs    Amitriptyline Hcl 25 Mg Tabs (Amitriptyline hcl) .[name]... 1 by mouth qhs  Problem # 3:  HYPERLIPIDEMIA (ICD-272.4) no change Her updated  medication list for this problem includes:    Crestor 10 Mg Tabs (Rosuvastatin calcium) .[name]... Take 1 tablet by mouth at bedtime  Labs Reviewed: SGOT: 25 (03/13/2009)   SGPT: 22 (03/13/2009)   HDL:82.80 (03/13/2009), 88.2 (03/07/2008)  LDL:72 (03/13/2009), 85 (03/07/2008)  Chol:170 (03/13/2009), 184 (03/07/2008)  Trig:74.0 (03/13/2009), 56 (03/07/2008)  Problem # 4:  NEURALGIA, TRIGEMINAL  (ICD-350.1) symptoms well controlled, see history of present illness, discussed the patient the diagnosis   Problem # 5:  HYPOTHYROIDISM (ICD-244.9) TSH slightly elevated, was 5.02 months ago. Plan: Recheck from time to time  Problem # 6:  GERD (ICD-530.81) symptoms well controlled, on Elavil  Complete Medication List: 1)  Crestor 10 Mg Tabs (Rosuvastatin calcium) .... Take 1 tablet by mouth at bedtime 2)  Xanax 0.5 Mg Tabs (Alprazolam) .[name].. 1 po qhs 3)  Amitriptyline Hcl 25 Mg Tabs (Amitriptyline hcl) .[name].. 1 by mouth qhs 4)  Neurontin 100 Mg Caps (Gabapentin) .... 2 by mouth once daily  Patient Instructions: 1)  Please schedule a follow-up appointment in 6 months .  If you wouldPrescriptions: XANAX 0.5 MG TABS (ALPRAZOLAM) 1 po qhs  #90 x 1   Entered by:   Army Fossa CMA   Authorized by:   Nolon Rod. Paz MD   Signed by:   Army Fossa CMA on 09/11/2009   Method used:   Printed then faxed to ...       [facility]       [address] [place], [place]  [address]       Ph: [phone]       Fax: [phone]   RxID:   [id number] AMITRIPTYLINE HCL 25 MG TABS (AMITRIPTYLINE HCL) 1 by mouth qhs  #90 x 1   Entered by:   Army Fossa CMA   Authorized by:   Nolon Rod. Paz MD   Signed by:   Army Fossa CMA on 09/11/2009   Method used:   Printed then faxed to ...       [facility]       [address] [place], [place]  [address]       Ph: [phone]       Fax: [phone]   RxID:   [id number]

## 2010-02-18 NOTE — Progress Notes (Signed)
Summary: Medication  Medications Added ACIPHEX 20 MG TBEC (RABEPRAZOLE SODIUM) 1 by mouth once daily       Phone Note Call from Patient Call back at Home Phone [phone]   Caller: Patient Call For: Dr. Arlyce Dice Reason for Call: Talk to Nurse Details for Reason: Medication Summary of Call: Pt. is having issues with reflux which she finds "quite bothersome." Wondered if she could have a prescription called in to help her. It appears she has taken Aciphex in the past.  Please call and advise. Initial call taken by: Schuyler Amor,  December 09, 2009 12:58 PM  Follow-up for Phone Call        Patient  c/o upper chest pressure, GERD.  She hasn't been on a PPI since 09.  She has tried OTC maalox with no relief. She has a follow up appointment on 12/30/09.  She is requesting a rx for Aciphex.  Dr Arlyce Dice please advise.   Follow-up by: Darcey Nora RN, CGRN,  December 09, 2009 3:03 PM  Additional Follow-up for Phone Call Additional follow up Details #1::        ok for aciphex Additional Follow-up by: Louis Meckel MD,  December 09, 2009 4:08 PM    Additional Follow-up for Phone Call Additional follow up Details #2::    patient aware.   Follow-up by: Darcey Nora RN, CGRN,  December 09, 2009 4:23 PM  New/Updated Medications: ACIPHEX 20 MG TBEC (RABEPRAZOLE SODIUM) 1 by mouth once daily Prescriptions: ACIPHEX 20 MG TBEC (RABEPRAZOLE SODIUM) 1 by mouth once daily  #30 x 1   Entered by:   Darcey Nora RN, CGRN   Authorized by:   Louis Meckel MD   Signed by:   Darcey Nora RN, CGRN on 12/09/2009   Method used:   [facility]        [facility]  [place] [address]       [address]       [place], [place]  [address]       Ph: [phone] or [phone]       Fax: [phone]   RxID:   [id number]

## 2010-02-20 NOTE — Letter (Signed)
Summary: EGD Instructions  [facility]  [address] [place], [place] [address]   Phone: [phone]  Fax: [phone]       [name] [name]    Apr 24, 1945    MRN: [id number]       Procedure Day /Date:FRIDAY 01/10/2010     Arrival Time: 8:30AM     Procedure Time:9:30AM     Location of Procedure:                     [facility] ( Outpatient Registration)   PREPARATION FOR ENDOSCOPY/DIL   On12/23/2011 THE DAY OF THE PROCEDURE:  1.   No solid foods, milk or milk products are allowed after midnight the night before your procedure.  2.   Do not drink anything colored red or purple.  Avoid juices with pulp.  No orange juice.  3.  You may drink clear liquids until7:30AM, which is 2 hours before your procedure.                                                                                                CLEAR LIQUIDS INCLUDE: Water Jello Ice Popsicles Tea (sugar ok, no milk/cream) Powdered fruit flavored drinks Coffee (sugar ok, no milk/cream) Gatorade Juice: apple, white grape, white cranberry  Lemonade Clear bullion, consomm, broth Carbonated beverages (any kind) Strained chicken noodle soup Hard Candy   MEDICATION INSTRUCTIONS  Unless otherwise instructed, you should take regular prescription medications with a small sip of water as early as possible the morning of your procedure.           OTHER INSTRUCTIONS  You will need a responsible adult at least 65 years of age to accompany you and drive you home.   This person must remain in the waiting room during your procedure.  Wear loose fitting clothing that is easily removed.  Leave jewelry and other valuables at home.  However, you may wish to bring a book to read or an iPod/MP3 player to listen to music as you wait for your procedure to start.  Remove all body piercing jewelry and leave at home.  Total time from sign-in until discharge is approximately 2-3 hours.  You should go  home directly after your procedure and rest.  You can resume normal activities the day after your procedure.  The day of your procedure you should not:   Drive   Make legal decisions   Operate machinery   Drink alcohol   Return to work  You will receive specific instructions about eating, activities and medications before you leave.    The above instructions have been reviewed and explained to me by   _______________________    I fully understand and can verbalize these instructions _____________________________ Date _________

## 2010-02-20 NOTE — Miscellaneous (Signed)
  Clinical Lists Changes  Medications: Changed medication from ACIPHEX 20 MG TBEC (RABEPRAZOLE SODIUM) 1 by mouth once daily to ACIPHEX 20 MG TBEC (RABEPRAZOLE SODIUM) 1 by mouth before breakfast and dinner for 2 weeks, then once daily - Signed Rx of ACIPHEX 20 MG TBEC (RABEPRAZOLE SODIUM) 1 by mouth before breakfast and dinner for 2 weeks, then once daily;  #45 x 2;  Signed;  Entered by: Louis Meckel MD;  Authorized by: Louis Meckel MD;  Method used: [facility]  [place] [address] [address], [place], [place]  [address], Ph: [phone] or [phone], Fax: [phone]    Prescriptions: ACIPHEX 20 MG TBEC (RABEPRAZOLE SODIUM) 1 by mouth before breakfast and dinner for 2 weeks, then once daily  #45 x 2   Entered and Authorized by:   Louis Meckel MD   Signed by:   Louis Meckel MD on 01/10/2010   Method used:   [facility]ically to        [facility]  [place] [address] ([facility]ail)       [address]       [place], [place]  [address]       Ph: [phone] or [phone]       Fax: [phone]   RxID:   [id number]

## 2010-02-20 NOTE — Progress Notes (Signed)
Summary: Medication  Medications Added LEVBID 0.375 MG TB12 (HYOSCYAMINE SULFATE) Take 1 tablet by mouth every twelve hours LEVBID 0.375 MG TB12 (HYOSCYAMINE SULFATE) Take 1 tablet by mouth every twelve hours CRESTOR 10 MG TABS (ROSUVASTATIN CALCIUM) Take 1 tablet by mouth at bedtime XANAX 0.5 MG TABS (ALPRAZOLAM) 1/2 to 1 po qhs XANAX 0.5 MG TABS (ALPRAZOLAM) 1/2 - 1 tid XANAX 0.5 MG TABS (ALPRAZOLAM) 1 po qhs ACIPHEX 20 MG TBEC (RABEPRAZOLE SODIUM) Take 1 tablet by mouth every morning ACIPHEX 20 MG TBEC (RABEPRAZOLE SODIUM) Take 1 tablet by mouth every morning CIPRO 500 MG  TABS (CIPROFLOXACIN HCL) 1 by mouth two times a day CIPRO 500 MG  TABS (CIPROFLOXACIN HCL) 1 by mouth two times a day PYRIDIUM 200 MG  TABS (PHENAZOPYRIDINE HCL) 1 by mouth three times a day x 2 days PYRIDIUM 200 MG  TABS (PHENAZOPYRIDINE HCL) 1 by mouth three times a day x 2 days CALCIUM 600/VITAMIN D 600-400 MG-UNIT  TABS (CALCIUM CARBONATE-VITAMIN D) Take 1 tablet by mouth once a day CALCIUM 600/VITAMIN D 600-400 MG-UNIT  TABS (CALCIUM CARBONATE-VITAMIN D) Take 1 tablet by mouth once a day MULTIVITAMINS   TABS (MULTIPLE VITAMIN) Take 1 tablet by mouth once a day MULTIVITAMINS   TABS (MULTIPLE VITAMIN) Take 1 tablet by mouth once a day CRANBERRY 500 MG  CAPS (CRANBERRY) .qdtab CRANBERRY 500 MG  CAPS (CRANBERRY) .qdtab ACIPHEX 20 MG TBEC (RABEPRAZOLE SODIUM) two times a day ACIPHEX 20 MG TBEC (RABEPRAZOLE SODIUM) two times a day DARVOCET A500 100-500 MG TABS (PROPOXYPHENE N-APAP) 2 TABS Q4-6H PRN DARVOCET A500 100-500 MG TABS (PROPOXYPHENE N-APAP) 2 TABS Q4-6H PRN HYOMAX-DT 0.375 MG CR-TABS (HYOSCYAMINE SULFATE) Take one by mouth twice a day for 5 days, then twice a day as needed for abd. cramping/pain HYOMAX-DT 0.375 MG CR-TABS (HYOSCYAMINE SULFATE) Take one by mouth twice a day for 5 days, then twice a day as needed for abd. cramping/pain SYNTHROID 50 MCG TABS (LEVOTHYROXINE SODIUM) 1 by mouth once daily SYNTHROID  50 MCG TABS (LEVOTHYROXINE SODIUM) 1 by mouth once daily DULCOLAX 5 MG  TBEC (BISACODYL) Day before procedure take 2 at 3pm and 2 at 8pm. DULCOLAX 5 MG  TBEC (BISACODYL) Day before procedure take 2 at 3pm and 2 at 8pm. METOCLOPRAMIDE HCL 10 MG  TABS (METOCLOPRAMIDE HCL) As per prep instructions. METOCLOPRAMIDE HCL 10 MG  TABS (METOCLOPRAMIDE HCL) As per prep instructions. MIRALAX   POWD (POLYETHYLENE GLYCOL 3350) As per prep  instructions. MIRALAX   POWD (POLYETHYLENE GLYCOL 3350) As per prep  instructions. AMITRIPTYLINE HCL 25 MG TABS (AMITRIPTYLINE HCL) 1 tab before retiring for 7 days then increase to 2 tabs AMITRIPTYLINE HCL 25 MG TABS (AMITRIPTYLINE HCL) 1/2 by mouth qd AMITRIPTYLINE HCL 25 MG TABS (AMITRIPTYLINE HCL) 1/2 by mouth qd AZITHROMYCIN 250 MG TABS (AZITHROMYCIN) as per pack AZITHROMYCIN 250 MG TABS (AZITHROMYCIN) as per pack PROMETHAZINE-CODEINE 6.25-10 MG/5ML SYRP (PROMETHAZINE-CODEINE) 1 tsp q 6-8 hrs as needed for cough PROMETHAZINE-CODEINE 6.25-10 MG/5ML SYRP (PROMETHAZINE-CODEINE) 1 tsp q 6-8 hrs as needed for cough AMOXICILLIN 500 MG TABS (AMOXICILLIN) 1 tab by mouth three times a day x 7 days.  pt has taken before w/out rxn VENTOLIN HFA 108 (90 BASE) MCG/ACT AERS (ALBUTEROL SULFATE) 2 puffs every 4 hours as needed for wheezing VENTOLIN HFA 108 (90 BASE) MCG/ACT AERS (ALBUTEROL SULFATE) 2 puffs every 4 hours as needed for wheezing * CRANBERRY TABLET Take 1 tablet by mouth once a day * CRANBERRY TABLET Take 1 tablet by mouth once  a day * CRANBERRY PILLS  * CALCIUM AND VIT D OTC DAILY  * CALCIUM AND VIT D OTC DAILY  CLARITIN-D 24 HOUR 10-240 MG XR24H-TAB (LORATADINE-PSEUDOEPHEDRINE)  CLARITIN-D 24 HOUR 10-240 MG XR24H-TAB (LORATADINE-PSEUDOEPHEDRINE)  LEVAQUIN 750 MG TABS (LEVOFLOXACIN) one tablet by mouth daily for 5 days LEVAQUIN 750 MG TABS (LEVOFLOXACIN) one tablet by mouth daily for 5 days AMITRIPTYLINE HCL 25 MG TABS (AMITRIPTYLINE HCL) 1/2 by mouth  qhs AMITRIPTYLINE HCL 25 MG TABS (AMITRIPTYLINE HCL) 1 by mouth qhs NEURONTIN 100 MG CAPS (GABAPENTIN) one by mouth 3 times a day for one week, then take two by mouth 3 times a day NEURONTIN 100 MG CAPS (GABAPENTIN) one by mouth 3 times a day for one week, then take two by mouth 3 times a day VICODIN 5-500 MG TABS (HYDROCODONE-ACETAMINOPHEN) one or two by mouth every 6 hours as needed for pain VICODIN 5-500 MG TABS (HYDROCODONE-ACETAMINOPHEN) one or two by mouth every 6 hours as needed for pain VALTREX 1 GM TABS (VALACYCLOVIR HCL) three times a day x 7 days - please put rx on HOLD pt will call if she needs this over the weekend NEURONTIN 100 MG CAPS (GABAPENTIN) 2 by mouth once daily LIDOCAINE VISCOUS 2 % SOLN (LIDOCAINE HCL) apply to affected area every 3-4 hours as needed for pain.  use qtip to apply LIDOCAINE VISCOUS 2 % SOLN (LIDOCAINE HCL) apply to affected area every 3-4 hours as needed for pain.  use qtip to apply ACIPHEX 20 MG TBEC (RABEPRAZOLE SODIUM) 1 by mouth once daily ACIPHEX 20 MG TBEC (RABEPRAZOLE SODIUM) 1 by mouth before breakfast and dinner for 2 weeks, then once daily ACIPHEX 20 MG TBEC (RABEPRAZOLE SODIUM) 1 by mouth before breakfast and dinner for 2 weeks, then once daily       Phone Note Call from Patient Call back at Home Phone [phone]   Caller: Patient Call For: Dr. Arlyce Dice Reason for Call: Talk to Nurse Summary of Call: Pt just had EGD and wants to increase Aciphex, but her insurance company is turing it down because they want it go through [facility], pt needs [place] to call them and put it through [facility] Initial call taken by: [place] Johnson,  January 10, 2010 12:21 PM  Follow-up for Phone Call        [facility]ed [facility], [facility]. Will give her samples for her trip. Wants rx sent to [facility], will send today Follow-up by: Merri Ray CMA Duncan Dull),  January 10, 2010 1:24 PM    New/Updated Medications: ACIPHEX 20 MG TBEC  (RABEPRAZOLE SODIUM) 1 by mouth before breakfast and dinner for 2 weeks, then once daily Prescriptions: ACIPHEX 20 MG TBEC (RABEPRAZOLE SODIUM) 1 by mouth before breakfast and dinner for 2 weeks, then once daily  #102 x 4   Entered by:   Merri Ray CMA (AAMA)   Authorized by:   Louis Meckel MD   Signed by:   Merri Ray CMA (AAMA) on 01/10/2010   Method used:   Electronically to        [facility] ([facility]       [address]       [place], [place]  [address]       Ph: [phone]       Fax: [phone]   RxID:   [id number]

## 2010-02-20 NOTE — Letter (Signed)
Summary: Appt Reminder 2  [facility]  [address] [place], [place] [address]   Phone: [phone]  Fax: [phone]        January 10, 2010 MRN: [id number]    [facility] [address] [place], [place]  [address]    Dear Ms. Mayse,   You have a return appointment with Dr. Arlyce Dice on 02/13/10 at 10:15am.  Please remember to bring a complete list of the medicines you are taking, your insurance card and your co-pay.  If you have to cancel or reschedule this appointment, please call before 5:00 pm the evening before to avoid a cancellation fee.  If you have any questions or concerns, please call [phone].    Sincerely,    Selinda Michaels RN  Appended Document: Appt Reminder 2 Letter is mailed to the patient's home address

## 2010-02-20 NOTE — Procedures (Signed)
Summary: Instructions for procedure/[place]  Instructions for procedure/[place]   Imported By: Sherian Rein 01/03/2010 08:17:13  _____________________________________________________________________  External Attachment:    Type:   Image     Comment:   External Document

## 2010-02-20 NOTE — Assessment & Plan Note (Signed)
Summary: Follow-up EGD/[facility]    History of Present Illness Visit Type: Follow-up Visit Primary GI MD: Melvia Heaps MD [facility] Primary Provider: Willow Ora, MD Requesting Provider: na Chief Complaint: Patient here for f/u after endoscopy. She states that she is able to swallow much better. She does, however, complain of continued hoarsness. History of Present Illness:   [name] [name] has returned following upper endoscopy with dilatation of an esophageal stricture.  Dysphagia has resolved.  Her main complaint is hoarseness.  She takes AcipHex once a day.  Despite this she has persistent hoarseness.  She denies coughing or sore throat or pyrosis.   GI Review of Systems    Reports belching and  heartburn.      Denies abdominal pain, acid reflux, bloating, chest pain, dysphagia with liquids, dysphagia with solids, loss of appetite, nausea, vomiting, vomiting blood, weight loss, and  weight gain.        Denies anal fissure, black tarry stools, change in bowel habit, constipation, diarrhea, diverticulosis, fecal incontinence, heme positive stool, hemorrhoids, irritable bowel syndrome, jaundice, light color stool, liver problems, rectal bleeding, and  rectal pain.    Current Medications (verified): 1)  Crestor 10 Mg Tabs (Rosuvastatin Calcium) .... Take 1 Tablet By Mouth At Bedtime 2)  Xanax 0.5 Mg Tabs (Alprazolam) .[name].. 1 Po Qhs 3)  Amitriptyline Hcl 25 Mg Tabs (Amitriptyline Hcl) .[name].. 1 By Mouth Qhs 4)  Neurontin 100 Mg Caps (Gabapentin) .... 2 By Mouth Once Daily 5)  Aciphex 20 Mg Tbec (Rabeprazole Sodium) .[name].. 1 By Mouth Before Breakfast Daily  Allergies (verified): 1)  Rocephin  Past History:  Past Medical History: Reviewed history from 12/30/2009 and no changes required. 4-09.Marland Kitchenabdominal pain: EGD and CTs neg.Marland KitchenRx amytriptiline  (non ulcer dyspepsia) Anxiety solar lentigo, face ; s/p excision 3-09. Sees Dermatology q 6 months  Hypothyroidism  Dx 10-9 TSH 5.2 , then re-tested by Dr Talmage Nap  and told ok Trigeminal Neuralgia  APHTHOUS ULCERS (ICD-528.2) DIABETES MELLITUS, TYPE II (ICD-250.00)--Diet Control  ANXIETY (ICD-300.00) OSTEOPENIA (ICD-733.90) HYPERLIPIDEMIA (ICD-272.4) NEURALGIA, TRIGEMINAL (ICD-350.1) WEIGHT LOSS (ICD-783.21) ESOPHAGEAL STRICTURE (ICD-530.3) HEALTH SCREENING (ICD-V70.0) GERD (ICD-530.81) Hx of MELANOMA (ICD-172.9) DIVERTICULOSIS, COLON (ICD-562.10)  Past Surgical History: Cervical laminectomy Cholecystectomy Hysterectomy RT SHOULDER SURGERY COLON RESECTION 1999 d/t diverticulitis ; Colonoscopy 12/01/07 :Tics, Dr Arlyce Dice 1/08 CT FOR SPLEEN LESION - STABLE; PTX,S/P chest tube Right Arm Surgery to remove melanoma  Family History: Reviewed history from 03/07/2008 and no changes required. Colon Cancer:no Prostate Cancer--F breast ca--no Diabetes-- no MI-- GF in his 109s  melanoma--uncle Sister, endometrial Ca and Clear cell carcinoma  Social History: Reviewed history from 12/30/2009 and no changes required. Retired  Married 2 boys/children Patient is a former smoker.  30 yrs ago Alcohol Use - yes/ socially Patient gets regular exercise, x3/week diet-- does watch  Illicit Drug Use - no  Review of Systems       The patient complains of anxiety-new and voice change.  The patient denies allergy/sinus, anemia, arthritis/joint pain, back pain, blood in urine, breast changes/lumps, change in vision, confusion, cough, coughing up blood, depression-new, fainting, fatigue, fever, headaches-new, hearing problems, heart murmur, heart rhythm changes, itching, menstrual pain, muscle pains/cramps, night sweats, nosebleeds, pregnancy symptoms, shortness of breath, skin rash, sleeping problems, sore throat, swelling of feet/legs, swollen lymph glands, thirst - excessive, urination - excessive, urination changes/pain, urine leakage, and vision changes.    Vital Signs:  Patient profile:   65 year old female Menstrual status:  hysterectomy Height:  64.5 inches Weight:      131.38 pounds BMI:     22.28 BSA:     1.65 Pulse rate:   100 / minute Pulse rhythm:   regular BP sitting:   122 / 70  (left arm)  Vitals Entered By: Lamona Curl CMA (AAMA) (February 13, 2010 10:02 AM)   Impression & Recommendations:  Problem # 1:  DYSPHAGIA UNSPECIFIED (ICD-787.20) Assessment Improved Plan repeat dilatation p.r.n.  Problem # 2:  GERD (ICD-530.81) Persistent hoarseness may be a manifestation of her reflux.  Recommendations #1 increase AcipHex to twice a day; if not improved over the next 5-7 days she was instructed to get a formal ENT examination to rule out vocal cord polyps or other pathology  Problem # 3:  DIABETES MELLITUS, TYPE II (ICD-250.00) Assessment: Comment Only  Patient Instructions: 1)  Copy sent to : Willow Ora, MD Prescriptions: ACIPHEX 20 MG TBEC (RABEPRAZOLE SODIUM) Take 1 tablet by mouth two times a day  #180 x 0   Entered by:   Lamona Curl CMA (AAMA)   Authorized by:   Louis Meckel MD   Signed by:   Lamona Curl CMA (AAMA) on 02/13/2010   Method used:   Electronically to        [facility] (retail)             , [place]         Ph: [phone]       Fax: [phone]   RxID:   [id number]

## 2010-02-20 NOTE — Letter (Signed)
Summary: DM, thyromegaly (rx u/s), intolerant synthroid---endocrinology  [facility]   Imported By: Lanelle Bal 01/17/2010 14:55:52  _____________________________________________________________________  External Attachment:    Type:   Image     Comment:   External Document

## 2010-02-20 NOTE — Procedures (Signed)
Summary: Upper Endoscopy  Patient: [name] [name] Note: All result statuses are Final unless otherwise noted.  Tests: (1) Upper Endoscopy (EGD)   EGD Upper Endoscopy       DONE     [facility]     [address] [place], [place]  [address]           ENDOSCOPY PROCEDURE REPORT           PATIENT:  [name] [name], [name] [name]  MR#:  [id number]     BIRTHDATE:  1945/11/16, 63 yrs. old  GENDER:  female           ENDOSCOPIST:  Barbette Hair. Arlyce Dice, MD     Referred by:  Willow Ora, M.D.           PROCEDURE DATE:  01/10/2010     PROCEDURE:  EGD with balloon dilatation     ASA CLASS:  Class II     INDICATIONS:  dysphagia           MEDICATIONS:   Fentanyl 75 mcg, Versed 8 mg, glycopyrrolate     (Robinal) 0.2 mg IV     TOPICAL ANESTHETIC:  Cetacaine Spray           DESCRIPTION OF PROCEDURE:   After the risks benefits and     alternatives of the procedure were thoroughly explained, informed     consent was obtained.  The Pentax Gastroscope [id number] endoscope     was introduced through the mouth and advanced to the third portion     of the duodenum, without limitations.  The instrument was slowly     withdrawn as the mucosa was fully examined.     <<PROCEDUREIMAGES>>           A stricture was found at the gastroesophageal junction. Moderate     stricture. balloon dilation 15-16.5-63mm for 30 seconds each.     Moderate resistance to 18mm balloon (see image1). No heme     Otherwise the examination was normal.    Retroflexed views revealed     no abnormalities.    The scope was then withdrawn from the patient     and the procedure completed.           COMPLICATIONS:  None           ENDOSCOPIC IMPRESSION:     1) Stricture at the gastroesophageal junction - s/p balloon     dilitation     2) Otherwise normal examination     RECOMMENDATIONS:     1) PPI bid     2) Call office next 2-3 days to schedule an office appointment     for 4 weeks           REPEAT EXAM:  No        ______________________________     Barbette Hair. Arlyce Dice, MD           CC:           n.     eSIGNED:   Barbette Hair. Kaplan at 01/10/2010 10:19 AM           Alphonse Guild, [id number]  Note: An exclamation mark (!) indicates a result that was not dispersed into the flowsheet. Document Creation Date: 01/10/2010 10:19 AM _______________________________________________________________________  (1) Order result status: Final Collection or observation date-time: 01/10/2010 10:15 Requested date-time:  Receipt date-time:  Reported date-time:  Referring Physician:   Ordering Physician: Melvia Heaps [phone]) Specimen Source:  Source:  [facility] Order Number: [id number] Lab site:

## 2010-02-20 NOTE — Assessment & Plan Note (Signed)
Summary: GERD/sheri    History of Present Illness Visit Type: Follow-up Visit Primary GI MD: Melvia Heaps MD [facility] Primary Provider: Willow Ora, MD  Requesting Provider: na Chief Complaint: Dysphagia, food gets stuck when patient swallows, and GERD  History of Present Illness:   Mrs. Deshotel has returned for complaints of dysphagia.  She has developed dysphagia to solids.  She denies odynophagia.  She has occasional pyrosis.  With dysphagia she developed intermittent hoarseness and occasional coughing.  She remains on AcipHex 20 mg daily.  The patient has non-insulin-dependent diabetes mellitus.  She has a remote history of an esophageal stricture   GI Review of Systems    Reports acid reflux, dysphagia with solids, and  heartburn.      Denies abdominal pain, belching, bloating, chest pain, dysphagia with liquids, loss of appetite, nausea, vomiting, vomiting blood, weight loss, and  weight gain.        Denies anal fissure, black tarry stools, change in bowel habit, constipation, diarrhea, diverticulosis, fecal incontinence, heme positive stool, hemorrhoids, irritable bowel syndrome, jaundice, light color stool, liver problems, rectal bleeding, and  rectal pain.    Current Medications (verified): 1)  Crestor 10 Mg Tabs (Rosuvastatin Calcium) .... Take 1 Tablet By Mouth At Bedtime 2)  Xanax 0.5 Mg Tabs (Alprazolam) .[name].. 1 Po Qhs 3)  Amitriptyline Hcl 25 Mg Tabs (Amitriptyline Hcl) .[name].. 1 By Mouth Qhs 4)  Neurontin 100 Mg Caps (Gabapentin) .... 2 By Mouth Once Daily 5)  Aciphex 20 Mg Tbec (Rabeprazole Sodium) .[name].. 1 By Mouth Once Daily  Allergies (verified): 1)  Rocephin  Past History:  Past Medical History: 4-09.Marland Kitchenabdominal pain: EGD and CTs neg.Marland KitchenRx amytriptiline  (non ulcer dyspepsia) Anxiety solar lentigo, face ; s/p excision 3-09. Sees Dermatology q 6 months  Hypothyroidism  Dx 10-9 TSH 5.2 , then re-tested by Dr Talmage Nap and told ok Trigeminal Neuralgia  APHTHOUS ULCERS  (ICD-528.2) DIABETES MELLITUS, TYPE II (ICD-250.00)--Diet Control  ANXIETY (ICD-300.00) OSTEOPENIA (ICD-733.90) HYPERLIPIDEMIA (ICD-272.4) NEURALGIA, TRIGEMINAL (ICD-350.1) WEIGHT LOSS (ICD-783.21) ESOPHAGEAL STRICTURE (ICD-530.3) HEALTH SCREENING (ICD-V70.0) GERD (ICD-530.81) Hx of MELANOMA (ICD-172.9) DIVERTICULOSIS, COLON (ICD-562.10)  Past Surgical History: Reviewed history from 03/07/2008 and no changes required. Cervical laminectomy Cholecystectomy Hysterectomy RT SHOULDER SURGER COLON RESECTION 1999 d/t diverticulitis ; Colonoscopy 12/01/07 :Tics, Dr Arlyce Dice 1/08 CT FOR SPLEEN LESION - STABLE; PTX,S/P chest tube  Family History: Reviewed history from 03/07/2008 and no changes required. Colon Cancer:no Prostate Cancer--F breast ca--no Diabetes-- no MI-- GF in his 51s  melanoma--uncle Sister, endometrial Ca and Clear cell carcinoma  Social History: Retired  Married 2 boys/children Patient is a former smoker.  30 yrs ago Alcohol Use - yes/ socially Patient gets regular exercise, x3/week diet-- does watch  Illicit Drug Use - no  Review of Systems       The patient complains of arthritis/joint pain and voice change.  The patient denies allergy/sinus, anemia, anxiety-new, back pain, blood in urine, breast changes/lumps, change in vision, confusion, cough, coughing up blood, depression-new, fainting, fatigue, fever, headaches-new, hearing problems, heart murmur, heart rhythm changes, itching, menstrual pain, muscle pains/cramps, night sweats, nosebleeds, pregnancy symptoms, shortness of breath, skin rash, sleeping problems, sore throat, swelling of feet/legs, swollen lymph glands, thirst - excessive, urination - excessive, urination changes/pain, urine leakage, and vision changes.         All other systems were reviewed and were negative   Vital Signs:  Patient profile:   65 year old female Menstrual status:  hysterectomy Height:  64.5 inches Weight:      129  pounds BMI:     21.88 BSA:     1.63 Pulse rate:   88 / minute Pulse rhythm:   regular BP sitting:   116 / 64  (left arm) Cuff size:   regular  Vitals Entered By: Ok Anis CMA (December 30, 2009 2:17 PM)  Physical Exam  Additional Exam:  On physical exam she is a well-developed well-nourished female  skin: anicteric HEENT: normocephalic; PEERLA; no nasal or pharyngeal abnormalities neck: supple nodes: no cervical lymphadenopathy chest: clear to ausculatation and percussion heart: no murmurs, gallops, or rubs abd: soft, nontender; BS normoactive; no abdominal masses, tenderness, organomegaly rectal: deferred ext: no cynanosis, clubbing, edema skeletal: no deformities neuro: oriented x 3; no focal abnormalities    Impression & Recommendations:  Problem # 1:  DYSPHAGIA UNSPECIFIED (ICD-787.20) Assessment New  She very likely has a recurrent esophageal stricture  Recommendations #1 upper endoscopy with dilatation as indicated  Risks, alternatives, and complications of the procedure, including bleeding, perforation, and possible need for surgery, were explained to the patient.  Patient's questions were answered.  Orders: ZEGD (ZEGD)  Problem # 2:  DIABETES MELLITUS, TYPE II (ICD-250.00) Assessment: Comment Only

## 2010-03-17 ENCOUNTER — Encounter: Payer: Self-pay | Admitting: Internal Medicine

## 2010-03-17 ENCOUNTER — Ambulatory Visit (INDEPENDENT_AMBULATORY_CARE_PROVIDER_SITE_OTHER): Payer: Commercial Managed Care - PPO | Admitting: Internal Medicine

## 2010-03-17 ENCOUNTER — Other Ambulatory Visit: Payer: Self-pay | Admitting: Internal Medicine

## 2010-03-17 DIAGNOSIS — Z136 Encounter for screening for cardiovascular disorders: Secondary | ICD-10-CM

## 2010-03-17 DIAGNOSIS — E119 Type 2 diabetes mellitus without complications: Secondary | ICD-10-CM

## 2010-03-17 DIAGNOSIS — E785 Hyperlipidemia, unspecified: Secondary | ICD-10-CM

## 2010-03-17 DIAGNOSIS — E039 Hypothyroidism, unspecified: Secondary | ICD-10-CM

## 2010-03-17 DIAGNOSIS — K120 Recurrent oral aphthae: Secondary | ICD-10-CM

## 2010-03-17 DIAGNOSIS — Z0000 Encounter for general adult medical examination without abnormal findings: Secondary | ICD-10-CM

## 2010-03-17 DIAGNOSIS — M949 Disorder of cartilage, unspecified: Secondary | ICD-10-CM

## 2010-03-17 DIAGNOSIS — M899 Disorder of bone, unspecified: Secondary | ICD-10-CM

## 2010-03-17 LAB — ALT: ALT: 20 U/L (ref 0–35)

## 2010-03-17 LAB — LIPID PANEL
Cholesterol: 156 mg/dL (ref 0–200)
HDL: 67.6 mg/dL (ref >39.00)
LDL Cholesterol: 76 mg/dL (ref 0–99)
Total CHOL/HDL Ratio: 2
Triglycerides: 64 mg/dL (ref 0.0–149.0)
VLDL: 12.8 mg/dL (ref 0.0–40.0)

## 2010-03-17 LAB — BASIC METABOLIC PANEL
BUN: 18 mg/dL (ref 6–23)
CO2: 31 mEq/L (ref 19–32)
Calcium: 9.5 mg/dL (ref 8.4–10.5)
Chloride: 106 mEq/L (ref 96–112)
Creatinine, Ser: 0.8 mg/dL (ref 0.4–1.2)
GFR: 80.17 mL/min (ref >60.00)
Glucose, Bld: 118 mg/dL — ABNORMAL HIGH (ref 70–99)
Potassium: 5.1 mEq/L (ref 3.5–5.1)
Sodium: 143 mEq/L (ref 135–145)

## 2010-03-17 LAB — CONVERTED CEMR LAB: Vit D, 25-Hydroxy: 53 ng/mL (ref 30–89)

## 2010-03-17 LAB — AST: AST: 20 U/L (ref 0–37)

## 2010-03-17 LAB — MICROALBUMIN / CREATININE URINE RATIO
Creatinine,U: 89.3 mg/dL
Microalb Creat Ratio: 2.8 mg/g (ref 0.0–30.0)
Microalb, Ur: 2.5 mg/dL — ABNORMAL HIGH (ref 0.0–1.9)

## 2010-03-17 LAB — HEMOGLOBIN A1C: Hgb A1c MFr Bld: 6.7 % — ABNORMAL HIGH (ref 4.6–6.5)

## 2010-03-27 NOTE — Assessment & Plan Note (Signed)
Summary: CPX,  ROV/CBS   Vital Signs:  Patient profile:   65 year old female Menstrual status:  hysterectomy Weight:      130.38 pounds Pulse rate:   76 / minute Pulse rhythm:   regular BP sitting:   118 / 72  (left arm) Cuff size:   regular  Vitals Entered By: Army Fossa CMA (March 17, 2010 9:10 AM)  CC: CPX- fasting, no concerns Comments [facility] [address]   History of Present Illness: Here for Medicare AWV:  1.   Risk factors based on Past M, S, F history:  Reviewed 2.   Physical Activities: aerobics x 3 week at least  3.   Depression/mood: no problems reported or noted, xanax hepls w/ sleep 4.   Hearing: heaing aids work well for her  5.   ADL's: totally independent  6.   Fall Risk: low risk, very active  7.   Home Safety: does feel safe at home  8.   Height, weight, &visual acuity:see Vs, uses glasse  9.   Counseling: yes  10.   Labs ordered based on risk factors: yes  11.           Referral Coordination, if needed  12.           Care Plan, see a/p 13.            Cognitive Assessment:  motor skills, memory and cognition normal   in addition we discussed the following  Hyperlipidemia-- on crestor, well tolerated   Osteopenia-- on exercise, on Vit D, can't tol Ca  GERD-- EGD 12-11, s/p stretching , feels great   solar lentigo , h/o melanoma-- sees derm q 6 months  Hypothyroidism   sees  Dr Macy Mis 6 months, recently had a u/s, found a nodule, few weeks later nodule  went away on repeted  U/S       Current Medications (verified): 1)  Crestor 10 Mg Tabs (Rosuvastatin Calcium) .... Take 1 Tablet By Mouth At Bedtime 2)  Xanax 0.5 Mg Tabs (Alprazolam) .[name].. 1 Po Qhs 3)  Amitriptyline Hcl 25 Mg Tabs (Amitriptyline Hcl) .[name].. 1 By Mouth Qhs  Allergies (verified): 1)  Rocephin  Past History:  Past Medical History: DM II Hyperlipidemia Osteopenia Diverticulosis, colon GERD 4-09.Marland Kitchenabdominal pain: EGD and CTs neg.Marland KitchenRx amytriptiline  (non ulcer  dyspepsia) EGD 12-11.............s/p balloon  dilitation Anxiety solar lentigo, face ; s/p excision 3-09. Sees Dermatology q 6 months  Hypothyroidism  Dx 10-9 TSH 5.2 , then re-tested by Dr Talmage Nap and told ok Trigeminal Neuralgia  Hx of MELANOMA (ICD-172.9) DIVERTICULOSIS, COLON (ICD-562.10)    Past Surgical History: Reviewed history from 02/13/2010 and no changes required. Cervical laminectomy Cholecystectomy Hysterectomy RT SHOULDER SURGERY COLON RESECTION 1999 d/t diverticulitis ; Colonoscopy 12/01/07 :Tics, Dr Arlyce Dice 1/08 CT FOR SPLEEN LESION - STABLE; PTX,S/P chest tube Right Arm Surgery to remove melanoma  Family History: Reviewed history from 03/07/2008 and no changes required. Colon Cancer:no Prostate Cancer--F breast ca--no Diabetes-- no MI-- GF in his 32s  melanoma--uncle Sister, endometrial Ca and Clear cell carcinoma  Social History: Reviewed history from 12/30/2009 and no changes required. Retired  Married 2 boys/children Patient is a former smoker.  30 yrs ago Alcohol Use - yes/ socially Patient gets regular exercise, x3/week diet-- does watch  Illicit Drug Use - no  Review of Systems General:  Denies fatigue and fever. CV:  Denies chest pain or discomfort and swelling of feet. Resp:  Denies cough and shortness of  breath. GI:  Denies bloody stools and diarrhea. GU:  Denies abnormal vaginal bleeding and discharge.  Physical Exam  General:  alert, well-developed, and well-nourished.   Neck:  no masses, no thyromegaly, and no cervical lymphadenopathy.   Lungs:  normal respiratory effort, no intercostal retractions, no accessory muscle use, and normal breath sounds.   Heart:  normal rate, regular rhythm, no murmur, and no gallop.   Abdomen:  soft, non-tender, no distention, no masses, no guarding, and no rigidity.   Extremities:  no edema Psych:  Cognition and judgment appear intact. Alert and cooperative with normal attention span and concentration.   not anxious appearing and not depressed appearing.     Impression & Recommendations:  Problem # 1:  HEALTH SCREENING (ICD-V70.0)  Td 05 shingles shot discussed, Rx provided    Gyn is Dr.  Eustaquio Boyden MMG already schedule DEXA pr gyn   EKG normal, no previous EKGs labs-------------------------will forward results to DR Ballan last Cscope 2009  doing great, continue with healthy lifestyle  Orders: Medicare -1st Annual Wellness Visit [phone])  Problem # 2:  DIABETES MELLITUS, TYPE II (ICD-250.00)  Labs Reviewed: Creat: 0.86 (06/27/2009)   Microalbumin: 2.5 (06/27/2009) Reviewed HgBA1c results: 6.2 (06/27/2009)  6.4 (03/13/2009)  Orders: TLB-BMP (Basic Metabolic Panel-BMET) (80048-METABOL) TLB-A1C / Hgb A1C (Glycohemoglobin) (83036-A1C) TLB-Microalbumin/Creat Ratio, Urine (82043-MALB) Specimen Handling (60454)  Problem # 3:  OSTEOPENIA (ICD-733.90)  on vitamin D,   can't tolerate calcium.  she is very active Bone density test done @ gynecology  Orders: T-Vitamin D (25-Hydroxy) [phone]) Specimen Handling (29562)  Problem # 4:  HYPERLIPIDEMIA (ICD-272.4) good medication compliance w. crestor  Her updated medication list for this problem includes:    Crestor 10 Mg Tabs (Rosuvastatin calcium) .[name]... Take 1 tablet by mouth at bedtime  Labs Reviewed: SGOT: 25 (03/13/2009)   SGPT: 22 (03/13/2009)   HDL:82.80 (03/13/2009), 88.2 (03/07/2008)  LDL:72 (03/13/2009), 85 (03/07/2008)  Chol:170 (03/13/2009), 184 (03/07/2008)  Trig:74.0 (03/13/2009), 56 (03/07/2008)  Orders: Venipuncture (13086) TLB-ALT (SGPT) (84460-ALT) TLB-AST (SGOT) (84450-SGOT) TLB-Lipid Panel (80061-LIPID) Specimen Handling (57846)  Problem # 5:  APHTHOUS ULCERS (ICD-528.2)  followup per dentist , she get the ulcers at an area of a previous bone graft  Problem # 6:  HYPOTHYROIDISM (ICD-244.9)  Labs Reviewed: TSH: 2.64 (09/04/2008)    HgBA1c: 6.2 (06/27/2009) Chol: 170 (03/13/2009)   HDL: 82.80  (03/13/2009)   LDL: 72 (03/13/2009)   TG: 74.0 (03/13/2009)  Complete Medication List: 1)  Crestor 10 Mg Tabs (Rosuvastatin calcium) .... Take 1 tablet by mouth at bedtime 2)  Xanax 0.5 Mg Tabs (Alprazolam) .[name].. 1 po qhs 3)  Amitriptyline Hcl 25 Mg Tabs (Amitriptyline hcl) .[name].. 1 by mouth qhs 4)  Zostavax 96295 Unt/0.52ml Solr (Zoster vaccine live) .[name].. 1 s.q.  Other Orders: EKG w/ Interpretation (93000)  Patient Instructions: 1)  Please schedule a follow-up appointment in 6 months .  Prescriptions: AMITRIPTYLINE HCL 25 MG TABS (AMITRIPTYLINE HCL) 1 by mouth qhs  #90 x 3   Entered and Authorized by:   Elita Quick E. Catlin Aycock MD   Signed by:   Nolon Rod. Che Rachal MD on 03/17/2010   Method used:   Print then Give to Patient   RxID:   [id number] XANAX 0.5 MG TABS (ALPRAZOLAM) 1 po qhs  #90 x 1   Entered and Authorized by:   Nolon Rod. Rondarius Kadrmas MD   Signed by:   Nolon Rod. Abbey Veith MD on 03/17/2010   Method used:   Print then Give to Patient  RxID:   [id number] ZOSTAVAX 56213 UNT/0.65ML SOLR (ZOSTER VACCINE LIVE) 1 s.q.  #1 x 0   Entered and Authorized by:   Nolon Rod. Bradan Congrove MD   Signed by:   Nolon Rod. Nga Rabon MD on 03/17/2010   Method used:   Print then Give to Patient   RxID:   [id number] CRESTOR 10 MG TABS (ROSUVASTATIN CALCIUM) Take 1 tablet by mouth at bedtime  #90 x 2   Entered by:   Army Fossa CMA   Authorized by:   Nolon Rod. Deicy Rusk MD   Signed by:   Nolon Rod. Georgianna Band MD on 03/17/2010   Method used:   Print then Give to Patient   RxID:   [id number]    Orders Added: 1)  EKG w/ Interpretation [93000] 2)  Venipuncture [36415] 3)  TLB-BMP (Basic Metabolic Panel-BMET) [80048-METABOL] 4)  TLB-ALT (SGPT) [84460-ALT] 5)  TLB-AST (SGOT) [84450-SGOT] 6)  TLB-A1C / Hgb A1C (Glycohemoglobin) [83036-A1C] 7)  TLB-Microalbumin/Creat Ratio, Urine [82043-MALB] 8)  TLB-Lipid Panel [80061-LIPID] 9)  T-Vitamin D (25-Hydroxy) [40347-42595] 10)  Specimen Handling [99000] 11)  Medicare -1st Annual Wellness Visit  [G0438] 12)  Est. Patient Level III [63875]

## 2010-04-21 ENCOUNTER — Ambulatory Visit (INDEPENDENT_AMBULATORY_CARE_PROVIDER_SITE_OTHER): Payer: Commercial Managed Care - PPO

## 2010-04-21 ENCOUNTER — Inpatient Hospital Stay (INDEPENDENT_AMBULATORY_CARE_PROVIDER_SITE_OTHER)
Admission: RE | Admit: 2010-04-21 | Discharge: 2010-04-21 | Disposition: A | Discharge status: Home / Self Care | Payer: Commercial Managed Care - PPO | Source: Ambulatory Visit | Attending: Family Medicine | Admitting: Family Medicine

## 2010-04-21 DIAGNOSIS — J40 Bronchitis, not specified as acute or chronic: Secondary | ICD-10-CM

## 2010-04-28 ENCOUNTER — Ambulatory Visit (INDEPENDENT_AMBULATORY_CARE_PROVIDER_SITE_OTHER): Payer: Commercial Managed Care - PPO | Admitting: Family Medicine

## 2010-04-28 ENCOUNTER — Encounter: Payer: Self-pay | Admitting: Family Medicine

## 2010-04-28 DIAGNOSIS — R198 Other specified symptoms and signs involving the digestive system and abdomen: Secondary | ICD-10-CM | POA: Insufficient documentation

## 2010-04-28 DIAGNOSIS — R109 Unspecified abdominal pain: Secondary | ICD-10-CM

## 2010-04-28 LAB — CBC WITH DIFFERENTIAL/PLATELET
Basophils Absolute: 0 10*3/uL (ref 0.0–0.1)
Basophils Relative: 0.4 % (ref 0.0–3.0)
Eosinophils Absolute: 0 10*3/uL (ref 0.0–0.7)
Eosinophils Relative: 0.6 % (ref 0.0–5.0)
HCT: 42.8 % (ref 36.0–46.0)
Hemoglobin: 14.5 g/dL (ref 12.0–15.0)
Lymphocytes Relative: 19 % (ref 12.0–46.0)
Lymphs Abs: 1.5 10*3/uL (ref 0.7–4.0)
MCHC: 33.9 g/dL (ref 30.0–36.0)
MCV: 94.6 fl (ref 78.0–100.0)
Monocytes Absolute: 0.7 10*3/uL (ref 0.1–1.0)
Monocytes Relative: 8.9 % (ref 3.0–12.0)
Neutro Abs: 5.6 10*3/uL (ref 1.4–7.7)
Neutrophils Relative %: 71.1 % (ref 43.0–77.0)
Platelets: 203 10*3/uL (ref 150.0–400.0)
RBC: 4.53 Mil/uL (ref 3.87–5.11)
RDW: 13 % (ref 11.5–14.6)
WBC: 7.8 10*3/uL (ref 4.5–10.5)

## 2010-04-28 LAB — HEPATIC FUNCTION PANEL
ALT: 16 U/L (ref 0–35)
AST: 17 U/L (ref 0–37)
Albumin: 4.4 g/dL (ref 3.5–5.2)
Alkaline Phosphatase: 84 U/L (ref 39–117)
Bilirubin, Direct: 0.1 mg/dL (ref 0.0–0.3)
Total Bilirubin: 0.5 mg/dL (ref 0.3–1.2)
Total Protein: 7.2 g/dL (ref 6.0–8.3)

## 2010-04-28 LAB — BASIC METABOLIC PANEL
BUN: 19 mg/dL (ref 6–23)
CO2: 31 mEq/L (ref 19–32)
Calcium: 9.9 mg/dL (ref 8.4–10.5)
Chloride: 99 mEq/L (ref 96–112)
Creatinine, Ser: 0.8 mg/dL (ref 0.4–1.2)
GFR: 80.14 mL/min (ref >60.00)
Glucose, Bld: 90 mg/dL (ref 70–99)
Potassium: 4.7 mEq/L (ref 3.5–5.1)
Sodium: 139 mEq/L (ref 135–145)

## 2010-04-28 MED ORDER — METRONIDAZOLE 500 MG PO TABS: 500.0000 mg | ORAL_TABLET | Freq: Three times a day (TID) | ORAL | Status: AC

## 2010-04-28 MED ORDER — CIPROFLOXACIN HCL 500 MG PO TABS: 500.0000 mg | ORAL_TABLET | Freq: Two times a day (BID) | ORAL | Status: AC

## 2010-04-28 NOTE — Patient Instructions (Signed)
We will notify you of your lab results Start the Cipro and Flagyl for suspected diverticulitis Drink plenty of fluids If your pain worsens or you have other concerns- please call or go to the ER Hang in there!!!

## 2010-04-28 NOTE — Progress Notes (Signed)
  Subjective:    Patient ID: [name] [name], female    DOB: [birth year], 65 y.o.   MRN: [id number]  HPI Diarrhea- reports loose stools starting mid-March.  Now having mixture of loose and watery stools associated w/ 'pinching' pain across upper abdomen.  Hx of colon resection and multiple abdominal surgeries.  'it has weakened me'.  'i'm exhausted'.  No fevers.  Will have 1 BM daily.  No pain in the AM, by mid-afternoon 'i can hear my bowel sounds'.  By late evening will have 'pulling pain' in pelvis'.  Hasn't taken any immodium.  Has been eating BRAT diet.  Hx of diverticulosis.  No blood in stool.  No N/V.   Review of Systems For ROS see HPI     Objective:   Physical Exam  Constitutional: She appears well-developed and well-nourished. No distress.  HENT:  Head: Normocephalic and atraumatic.  Cardiovascular: Normal rate, regular rhythm, normal heart sounds and intact distal pulses.   Pulmonary/Chest: Effort normal and breath sounds normal. No respiratory distress.  Abdominal: Soft. Bowel sounds are normal. She exhibits no distension. There is no rebound and no guarding.       Diffuse tenderness across abdomen but pain out of proportion to exam in LLQ          Assessment & Plan:

## 2010-04-30 ENCOUNTER — Emergency Department (INDEPENDENT_AMBULATORY_CARE_PROVIDER_SITE_OTHER): Payer: Commercial Managed Care - PPO

## 2010-04-30 ENCOUNTER — Emergency Department (HOSPITAL_BASED_OUTPATIENT_CLINIC_OR_DEPARTMENT_OTHER)
Admission: EM | Admit: 2010-04-30 | Discharge: 2010-04-30 | Disposition: A | Discharge status: Home / Self Care | Payer: Commercial Managed Care - PPO | Attending: Emergency Medicine | Admitting: Emergency Medicine

## 2010-04-30 DIAGNOSIS — Z85820 Personal history of malignant melanoma of skin: Secondary | ICD-10-CM

## 2010-04-30 DIAGNOSIS — E7800 Pure hypercholesterolemia, unspecified: Secondary | ICD-10-CM | POA: Insufficient documentation

## 2010-04-30 DIAGNOSIS — Z9049 Acquired absence of other specified parts of digestive tract: Secondary | ICD-10-CM

## 2010-04-30 DIAGNOSIS — E119 Type 2 diabetes mellitus without complications: Secondary | ICD-10-CM | POA: Insufficient documentation

## 2010-04-30 DIAGNOSIS — Z90710 Acquired absence of both cervix and uterus: Secondary | ICD-10-CM

## 2010-04-30 DIAGNOSIS — Z79899 Other long term (current) drug therapy: Secondary | ICD-10-CM | POA: Insufficient documentation

## 2010-04-30 DIAGNOSIS — R1032 Left lower quadrant pain: Secondary | ICD-10-CM | POA: Insufficient documentation

## 2010-04-30 DIAGNOSIS — R109 Unspecified abdominal pain: Secondary | ICD-10-CM

## 2010-04-30 LAB — LIPASE, BLOOD: Lipase: 145 U/L (ref 23–300)

## 2010-04-30 LAB — URINALYSIS, ROUTINE W REFLEX MICROSCOPIC
Bilirubin Urine: NEGATIVE
Glucose, UA: NEGATIVE mg/dL
Hgb urine dipstick: NEGATIVE
Ketones, ur: NEGATIVE mg/dL
Nitrite: NEGATIVE
Protein, ur: NEGATIVE mg/dL
Specific Gravity, Urine: 1.005 (ref 1.005–1.030)
Urobilinogen, UA: 0.2 mg/dL (ref 0.0–1.0)
pH: 6 (ref 5.0–8.0)

## 2010-04-30 LAB — DIFFERENTIAL
Basophils Absolute: 0 10*3/uL (ref 0.0–0.1)
Basophils Relative: 0 % (ref 0–1)
Eosinophils Absolute: 0.1 10*3/uL (ref 0.0–0.7)
Eosinophils Relative: 1 % (ref 0–5)
Lymphocytes Relative: 15 % (ref 12–46)
Lymphs Abs: 1 10*3/uL (ref 0.7–4.0)
Monocytes Absolute: 0.6 10*3/uL (ref 0.1–1.0)
Monocytes Relative: 9 % (ref 3–12)
Neutro Abs: 5 10*3/uL (ref 1.7–7.7)
Neutrophils Relative %: 75 % (ref 43–77)

## 2010-04-30 LAB — CBC
HCT: 43.1 % (ref 36.0–46.0)
Hemoglobin: 14.7 g/dL (ref 12.0–15.0)
MCH: 30.4 pg (ref 26.0–34.0)
MCHC: 34.1 g/dL (ref 30.0–36.0)
MCV: 89 fL (ref 78.0–100.0)
Platelets: 211 10*3/uL (ref 150–400)
RBC: 4.84 MIL/uL (ref 3.87–5.11)
RDW: 12.4 % (ref 11.5–15.5)
WBC: 6.7 10*3/uL (ref 4.0–10.5)

## 2010-04-30 LAB — COMPREHENSIVE METABOLIC PANEL
ALT: 18 U/L (ref 0–35)
AST: 24 U/L (ref 0–37)
Albumin: 4.8 g/dL (ref 3.5–5.2)
Alkaline Phosphatase: 110 U/L (ref 39–117)
BUN: 16 mg/dL (ref 6–23)
CO2: 27 mEq/L (ref 19–32)
Calcium: 10 mg/dL (ref 8.4–10.5)
Chloride: 103 mEq/L (ref 96–112)
Creatinine, Ser: 0.9 mg/dL (ref 0.4–1.2)
GFR calc Af Amer: >60 mL/min (ref >60)
GFR calc non Af Amer: >60 mL/min (ref >60)
Glucose, Bld: 124 mg/dL — ABNORMAL HIGH (ref 70–99)
Potassium: 4 mEq/L (ref 3.5–5.1)
Sodium: 146 mEq/L — ABNORMAL HIGH (ref 135–145)
Total Bilirubin: 0.6 mg/dL (ref 0.3–1.2)
Total Protein: 8 g/dL (ref 6.0–8.3)

## 2010-04-30 MED ORDER — IOHEXOL 300 MG/ML  SOLN
100.0000 mL | Freq: Once | INTRAMUSCULAR | Status: AC | PRN
  Administered 2010-04-30: 100 mL via INTRAVENOUS

## 2010-05-01 ENCOUNTER — Telehealth: Payer: Self-pay | Admitting: *Deleted

## 2010-05-01 NOTE — Telephone Encounter (Signed)
Spoke w/ pt says is starting to feel better. Says that if no improvement, she will schedule f/u w/ Dr. Drue Novel next week.

## 2010-05-01 NOTE — Telephone Encounter (Signed)
Message copied by Army Fossa on Thu May 01, 2010  9:22 AM ------      Message from: [name] [name]      Created: Thu May 01, 2010  8:45 AM       Micah Flesher to the ER w/ abdominal pain, please check on her

## 2010-05-01 NOTE — Telephone Encounter (Signed)
Message left for patient to return my call.  

## 2010-05-06 NOTE — Assessment & Plan Note (Signed)
Pt reports loose stools but she is still only having ~1 stool/day.  Not exceptionally voluminous, no blood.  Check CBC to r/o infxn and electrolytes to r/o imbalance.  Encouraged increased fluids.  Reviewed supportive care and red flags that should prompt return.  Pt expressed understanding and is in agreement w/ plan.

## 2010-05-06 NOTE — Assessment & Plan Note (Signed)
Pt's pain out of proportion to exam is consistent w/ diverticulitis.  Pt fears this is what she has.  Will check labs to assess presence of infxn and start empiric abx.  No CT scan at this time but if pt's sxs change or worsen will proceed w/ imaging.  Reviewed supportive care and red flags that should prompt return.  Pt expressed understanding and is in agreement w/ plan.

## 2010-06-03 NOTE — Assessment & Plan Note (Signed)
[facility]                         GASTROENTEROLOGY OFFICE NOTE   NAME:[name], [name] HOPPES                       MRN:          [id number]  DATE:06/01/2006                            DOB:          Aug 14, 1945    PROBLEM:  Abdominal pain.  [name] has returned for a scheduled  followup.  GI symptoms have entirely resolved since starting AcipHex.  She is without pain or nausea.  A gastric emptying scan was entirely  normal.   EXAM:  Pulse 70, blood pressure 122/64, weight 118.   IMPRESSION:  1. Nonspecific dyspepsia.  She clearly has had a good response to      AcipHex.  2. Stable lesion of the spleen.   RECOMMENDATIONS:  1. The patient was instructed to try stopping her AcipHex, but may      resume it as necessary.  2. Followup CT of the splenic lesion in October of 2008.     Barbette Hair. Arlyce Dice, MD,FACG  Electronically Signed    RDK/MedQ  DD: 06/01/2006  DT: 06/01/2006  Job #: [id number]   cc:   Willow Ora, MD

## 2010-06-03 NOTE — Assessment & Plan Note (Signed)
[facility]                         GASTROENTEROLOGY OFFICE NOTE   NAME:Schnetzer, KENASIA SCHELLER                       MRN:          [id number]  DATE:06/24/2006                            DOB:          1945-07-25    PROBLEM:  Abdominal pain.   Mrs. Bran has returned for ongoing evaluation.  Over the last two to  three days, her abdominal pain has entirely subsided.  She was taking  Aciphex twice a day and hyoscyamine as needed.  She currently is pain  free.  She has lowered her Aciphex to daily.  She associates her  abdominal pain with taking Boniva.  She was supposed to take this once a  month.   PHYSICAL EXAMINATION:  Pulse 76, blood pressure 120/60, weight 117.   IMPRESSION:  Abdominal pain - possibly secondary to medicine (Boniva).   RECOMMENDATIONS:  1. Continue Aciphex daily and hyoscyamine as needed.  2. Followup CT to re-evaluate a splenic lesion in October 2008.     Barbette Hair. Arlyce Dice, MD,FACG  Electronically Signed    RDK/MedQ  DD: 06/24/2006  DT: 06/24/2006  Job #: [id number]   cc:   Willow Ora, MD

## 2010-06-03 NOTE — Assessment & Plan Note (Signed)
[facility]                         GASTROENTEROLOGY OFFICE NOTE   NAME:[name], [name] CARRENO                       MRN:          [id number]  DATE:05/06/2007                            DOB:          07-May-1945    PROBLEM:  Abdominal pain.   [name] [name] has returned for reevaluation of abdominal pain. Over the  last week, she has been complaining of severe upper abdominal pain that  radiates to both sides. It actually lessens with eating. She took  Hyoscyamine initially without improvement. She was placed on twice a day  AcipHex by Dr. Drue Novel. Today she feels some diminution of pain. She is  without nausea or vomiting. She is moving her bowels regularly. She is  on no gastric irritants.   MEDICATIONS:  Xanax, AcipHex, Crestor and Bentyl.   PHYSICAL EXAMINATION:  She is a healthy-appearing female.  Pulse 72, blood pressure 122/68, weight 122.  CHEST:  Clear.  CARDIAC:  No murmurs, gallops or rubs.  ABDOMEN:  Without masses or organomegaly. She has mild mid epigastric  and right upper quadrant tenderness without guarding or rebound.   IMPRESSION:  Recurrent abdominal pain. In the past, we thought the pain  may be related to [facility]. Currently this clearly is not operative. She  probably has ulcer or non-ulcer dyspepsia.   RECOMMENDATIONS:  Continue current regimen since she is improving. I  carefully instructed [name] [name] to contact us if her pain does not  continue to improve at which point I would consider upper endoscopy and  possibly repeat CT of the abdomen (her splenic lesion previously  described is stable).     Barbette Hair. Arlyce Dice, MD,FACG  Electronically Signed    RDK/MedQ  DD: 05/06/2007  DT: 05/06/2007  Job #: [id number]   cc:   Willow Ora, MD

## 2010-06-03 NOTE — Assessment & Plan Note (Signed)
[facility]                         GASTROENTEROLOGY OFFICE NOTE   NAME:[name], [name] FEDORCHAK                       MRN:          [id number]  DATE:06/11/2006                            DOB:          09-Jul-1945    PROBLEM:  Abdominal pain.  [name] has returned for reevaluation.  She was doing well up until 1 week ago.  She took 1 dose of Boniva.  Within 24 hours she developed severe recurrent upper abdominal pain.  The pain radiates to her back.  It is associated with nausea and  anorexia.  She has been taking AcipHex twice a day without relief.   HISTORY:  Pertinent for a partial colon resection for diverticulitis and  a cholecystectomy.  She has also had surgery for lysis of adhesions.   MEDICATIONS:  1. AcipHex 20 mg twice a day.  2. Xanax.   EXAM:  She is a slightly ill-appearing female, pulse 84, blood pressure  112/60, weight 115.  HEENT:  EOMI. PERRLA. Sclerae are anicteric.  Conjunctivae are pink.  NECK:  Supple without thyromegaly, adenopathy or carotid bruits.  CHEST:  Clear to auscultation and percussion without adventitious  sounds.  CARDIAC:  Regular rhythm; normal S1 S2.  There are no murmurs, gallops  or rubs.  ABDOMEN:  She has mild midepigastric tenderness without guarding or  rebound.  There are no abdominal masses or organomegaly.  EXTREMITIES:  Full range of motion.  No cyanosis, clubbing or edema.  RECTAL:  Deferred.   IMPRESSION:  Recurrent abdominal pain, similar in quality, but greater  in intensity.  I suspect this is related to her Boniva.  Pancreatitis is  less likely.   RECOMMENDATION:  1. Add Levbid to her current medical regimen.  2. Check CBC, amylase, lipase, and LFTs.     Barbette Hair. Arlyce Dice, MD,FACG  Electronically Signed    RDK/MedQ  DD: 06/11/2006  DT: 06/11/2006  Job #: [id number]   cc:   Willow Ora, MD

## 2010-06-06 NOTE — Discharge Summary (Signed)
NAME:  [name] [name], [name] [name]                          ACCOUNT NO.:  [id number]   MEDICAL RECORD NO.:  [id number]                   PATIENT TYPE:  INP   LOCATION:  3105                                 FACILITY:  [facility]   PHYSICIAN:  Sharolyn Douglas, M.D.                     DATE OF BIRTH:  [birth year]   DATE OF ADMISSION:  07/24/2003  DATE OF DISCHARGE:  07/27/2003                                 DISCHARGE SUMMARY   ADMISSION DIAGNOSES:  1. Cervical spondylolytic radiculopathy.  2. Gastrointestinal reflux disease.  3. Diverticulitis.   DISCHARGE DIAGNOSES:  1. Status post anterior cervical diskectomy fusion at C4 to C6.  2. Status post oversedation with narcotic medication.  Narcan reversal.     Stable.  3. Gastrointestinal reflux disease.  4. Diverticulitis.   CONSULTS:  [facility].   PROCEDURES:  1. On July 24, 2003, the patient was taken to the operating room for ACDF of     C4-5 and C5-6.  This was done by Sharolyn Douglas, M.D., with the assistance of     [facility]  2. The anesthesia used was general.  The EKG from July 26, 2003, showed     normal sinus rhythm with no significant changes since previous.  Read by     Olga Millers, M.D.  3. On April 14, 2003, chest showed no active disease.  4. On July 24, 2003, a portable spine was used for intraoperative     localization.   LABORATORIES:  CBC with differential on July 20, 2003, was within normal  limits.  On July 26, 2003, the CBC was within normal limits with the  exception of a white count of 19.1.  PT and INR normal.  Comprehensive  metabolic panel from July 20, 2003, normal with the exception of a glucose of  109.  On July 26, 2003, normal with the exception of a glucose of 280 and  273, calcium of 8.1, and albumin of 3.2.  On July 26, 2003, at 0345 hours,  cardiac laboratories showed a CK of 193, CK-MB of 0.8, index of 0.4, and  troponin of 0.07.  This was repeated and at 1520 hours and was 143, 0.9,  0.6, and 0.03.   The UA from July 20, 2003, was negative.   BRIEF HISTORY:  The patient is a 65 year old female who has been treated by  Dr. Noel Gerold for progressively increasing neck and upper extremity pain.  She  responded to conservative management.  Because of her MRI findings, as well  as her physical exam and x-ray findings, it was thought her best course of  management would be an ACDF.  The risks and benefits of this procedure were  discussed with the patient by Dr. Noel Gerold, as well as myself.  She indicated  understanding and opted to proceed.   HOSPITAL COURSE:  On July 24, 2003, the  patient was taken to the operating  room for the above-listed procedures.  She tolerated the procedure well  without any intraoperative complications.  She was transferred to the  recovery room in stable condition.  Postoperatively routine orthopedic spine  protocol was followed, including prophylactic antibiotics, DVT prophylaxis,  and slow return to a regular soft diet.  On postoperative day #1, she was  having some mild nausea.  She was very pleased with her operative results as  her arm pain was gone.  However, secondary to the nausea and difficulty  eating, we did keep her in the hospital over that night.  Unfortunately,  during that night, she was given Dilaudid and shortly thereafter she was  unresponsive to her family.  Her family called the nurses to alert them.  They did call a code on her, as well as calling the P.A. on call, [name]  [name].  He ordered them to give her Narcan.  With the second dose,  she did begin to respond and began responding to questioning.  Dilaudid was  discontinued at that point.  She was also transferred down to the stepdown  unit for observation overnight.  By the following day, she was feeling  better, but extremely tired and she was responding appropriately to all  questioning.  I did call [facility] for a medical consult to  evaluate her and make sure she  was in good condition from a medical  standpoint.  We appreciate their assistance in seeing her.  She was kept  overnight one more night for monitoring.  By July 27, 2003, [place] had seen  her and thought she was stable and it was simply a case of oversedation.  She does not seem to have any cardiac problems or other problems.  By July 27, 2003, she was feeling much better.  She was ready for discharge.  Discharge instructions were given to her and she was discharged home with  her family's assistance.   ACTIVITY:  Daily ambulation.  No lifting heavier than 5 pounds.  No overhead  activity.  Brace on at all times.   WOUND CARE:  Keep the incision dry x 5 days and daily dressing changes.   FOLLOWUP:  Follow up in two weeks with Dr. Noel Gerold.   DISCHARGE MEDICATIONS:  Percocet and Robaxin as needed.  Calcium daily.  Multivitamin daily.  Laxative as needed __________ three months.   DIET:  Regular as tolerated.   CONDITION ON DISCHARGE:  Stable and improved.   DISPOSITION:  The patient is being discharged to her home with the family's  assistance.      Verlin Fester, P.A.                       Sharolyn Douglas, M.D.    CM/MEDQ  D:  08/22/2003  T:  08/22/2003  Job:  [id number]

## 2010-06-06 NOTE — H&P (Signed)
NAME:  [name] [name], [name] [name]                      ACCOUNT NO.:  [id number]   MEDICAL RECORD NO.:  [id number]                   PATIENT TYPE:  INP   LOCATION:  NA                                   FACILITY:  [facility]   PHYSICIAN:  Sharolyn Douglas, M.D.                     DATE OF BIRTH:  01-13-1946   DATE OF ADMISSION:  07/24/2003  DATE OF DISCHARGE:                                HISTORY & PHYSICAL   CHIEF COMPLAINT:  Pain in my neck, shoulders, and arms.   HISTORY OF PRESENT ILLNESS:  This 65 year old white female seen by [place] for  continued progressive problems concerning cervical discopathy with  radiculopathy in the C4-C5, C5-C6 areas of the upper neck and anterior chest  wall.  She has been worked up for any soft tissue problems in the thoracic  spine.  She has been treated with Sterapred dose packs as well as other  antiinflammatories.  Unfortunately, her pain continues to be persistent.  After careful review of her history as well as her myelogram of December  2004, it was felt that the main problem is coming from the cervical spine  with spondylotic radiculopathy at C4-C5, C5-C6.  As her MRI was done last  December, we will do one today to be sure there is no other pathology that  needs attention at this time of surgery.  Should there be any, we will  certainly discuss this with her prior to the surgery planned on July 24, 2003.  Dr. Noel Gerold has discussed the surgery with the patient as well as the  complications and the perioperative course with the patient.   PAST MEDICAL HISTORY:  1. The patient has been in relatively good health throughout her lifetime.  2. She does have reflux with diverticulitis.  3. She also has some hemorrhoids.   SURGERIES:  1. Hysterectomy in 1988.  2. Colon resection in 1999.  3. Cholecystectomy in 2000.  4. She had another colon operation in 2000.  5. She had melanoma surgery removed from her right arm in March 2004.   FAMILY HISTORY:  Hypertension in the  mother.  Cancer in the father.   CURRENT MEDICATIONS:  1. Lescol 80 mg one every day.  2. Nexium one every day.  3. Estrasorb 2.5 mg one every day.   ALLERGIES:  ROCEPHIN.   Dr. Angelena Sole is her family doctor.   SOCIAL HISTORY:  The patient is married, retired, does not partake of  tobacco products.  Has occasional intake of ETOH.  She has two children.  Her husband will be her caregiver after surgery.   REVIEW OF SYSTEMS:  CNS:  No seizure disorder, paralysis, double vision.  The patient does have radiculopathy consistent with C4-5, C5-C6 cervical  nerve roots in the upper extremities, neck, and chest wall.  CARDIOVASCULAR:  No chest pain, no angina, no orthopnea.  RESPIRATORY:  No productive  cough.  No hemoptysis.  No shortness of breath.  GASTROINTESTINAL:  No nausea,  vomiting, melena, bloody stools.  GENITOURINARY:  No discharge or hematuria.  MUSCULOSKELETAL:  Primarily in present illness.   PHYSICAL EXAMINATION:  GENERAL:  Alert, cooperative, friendly, 65 year old,  white female who is accompanied by her husband.  VITAL SIGNS:  Blood pressure 128/68, pulse 64, respirations 12.  HEENT:  Normocephalic.  PERRLA.  EOM intact.  Oropharynx is clear.  CHEST:  Clear to auscultation.  No rhonchi, no rales.  HEART:  Regular rate and rhythm.  No murmurs are heard.  ABDOMEN:  Soft, nontender.  Liver and spleen not felt.  GENITALIA/RECTAL/PELVIC/BREASTS:  Not done, not pertinent.  EXTREMITIES:  The patient has painful range of motion both shoulders.  She  has tenderness to palpation of the anterior chest wall.  Cervical range is  not done today.   ADMITTING DIAGNOSIS:  1. Cervical spondylotic radiculopathy C4-C5, C5-C6.  2. Reflux.  3. Diverticulitis.   PLAN:  The patient will undergo anterior cervical diskectomy and fusion C4-  C5, C5-C6 with Allograft and plate.      [name] [name].                 Sharolyn Douglas, M.D.    DLU/MEDQ  D:  07/18/2003  T:  07/18/2003   Job:  [id number]   cc:   Angelena Sole, M.D. [facility]

## 2010-06-06 NOTE — Consult Note (Signed)
[facility]                            ENDOCRINOLOGY CONSULTATION   NAME:[name] [name], [name] [name]                       MRN:          [id number]  DATE:09/24/2005                            DOB:          [birth year]    REFERRING PHYSICIAN:  Velora Heckler, MD   REASON FOR REFERRAL:  Parathyroid abnormality.   HISTORY OF THE PRESENT ILLNESS:  A 65 year old woman who was noted several  months ago to have an abnormal serum calcium and parathyroid level.  She  states she is feeling well in general.   She was also noted to have diabetes and she was surprised to hear this  because of her relatively lean body habitus and lack of family history of  diabetes.  I told her that this is really not the reason why she is here,  but she asked me to advise her about this.   PAST MEDICAL HISTORY:  1. Dyslipidemia.  2. GERD.  3. There is no history of fracture nor of urolithiasis.   SOCIAL HISTORY:  She is married.  She is retired.  She is here with her  husband.   FAMILY HISTORY:  She states her sister had urolithiasis, but there is no  other disorder of calcium in her family.   REVIEW OF SYSTEMS:  She denies urinary frequency and nocturia.  She has lost  20 pounds over the past few months due to her efforts, after she learned she  had diabetes.   PHYSICAL EXAMINATION:  VITAL SIGNS:  Blood pressure 118/75, heart rate 75,  temperature is 98.8.  The weight is 126.  GENERAL:  In no distress.  SKIN:  Normal texture and temperature.  NECK:  No nodules palpable.  EXTREMITIES:  Upper extremities and including the fingers, no deformity is  seen.  SPINE:  No kyphosis.   LABORATORY STUDIES:  On July 13, 2005, parathyroid level 12.3 pg/mL, calcium  10.0.  Also on July 13, 2005, hemoglobin A1c 6.2, TSH normal.  On September 09, 2005, twenty-four-hour urine calcium is 290 mg a day.  On September 04, 2005,  25-hydroxy vitamin D 31 ng/mL, which is normal, 1,25-dihydroxy vitamin D  50  pg/mL, which is also normal.  On July 13, 2005, ionized calcium 2.35.   IMPRESSION:  1. She has minimal if any abnormality of her parathyroid.  2. Her relatively lean body habitus and lack of family history of diabetes      suggest that she will evolve type 1 diabetes of adult onset.   PLAN:  1. Recheck parathyroid hormone today.  If it is normal, no further      parathyroid or calcium workup is needed.  2. I have advised her to continue to see Dr. Drue Novel regarding her diabetes      and continue to have a hemoglobin A1c every 3 months.  I have asked her      to call him sooner if she has any readings above 200 or consistently in      the high 100s.  Sean A. Everardo All, MD   SAE/MedQ  DD:  09/24/2005  DT:  09/25/2005  Job #:  [id number]   cc:   Velora Heckler, MD  Willow Ora, MD

## 2010-06-06 NOTE — Assessment & Plan Note (Signed)
[facility]                         GASTROENTEROLOGY OFFICE NOTE   NAME:Ribeiro, GREENLEY MARTONE                       MRN:          [id number]  DATE:04/19/2006                            DOB:          1945-02-06    PROBLEM:  Abdominal pain and nausea.   [name] has returned for reevaluation.  She again complains of  right upper quadrant pain.  Pain is in the subcostal area and feels like  a deep discomfort.  She feels it with bending over and then  straightening up.  It is not related to meals.  She is also complaining  of persistent nausea that occurs consistently in the afternoon.  She has  some anorexia with that as well.  A recent CT scan was unremarkable.  It  did show a hypodense area of the spleen that was stable compared to her  previous exam.  Lab work including CBC, LFTs, amylase and lipase were  all normal.  Symptoms of pain have improved since restarting AcipHex.   OTHER MEDICATIONS:  Zorex and Crestor.   ALLERGIES:  She is allergic to ROCEPHIN.   PHYSICAL EXAMINATION:  VITAL SIGNS:  Pulse 80, blood pressure 115/66,  weight 117.  HEENT: EOMI. PERRLA. Sclerae are anicteric.  Conjunctivae are pink.  NECK:  Supple without thyromegaly, adenopathy or carotid bruits.  CHEST:  Clear to auscultation and percussion without adventitious  sounds.  CARDIAC:  Regular rhythm; normal S1 S2.  There are no murmurs, gallops  or rubs.  ABDOMEN:  Bowel sounds are normoactive.  Abdomen is soft, non-tender and  non-distended.  There are no abdominal masses, tenderness, splenic  enlargement or hepatomegaly. There is no succussion splash.  EXTREMITIES:  Full range of motion.  No cyanosis, clubbing or edema.  RECTAL:  Exam deferred.   IMPRESSION:  1. Nonspecific upper abdominal pain.  There is no evidence for biliary      tract disease (patient is status post cholecystectomy).  She may      have nonulcer dyspepsia.  Pain could be related to gastroparesis as      well.  2. Nausea - rule out gastroparesis (patient  has a history of      diabetes).  3. Stable lesion of the liver.   RECOMMENDATION:  1. Continue AcipHex.  2. Gastric emptying scan.     Barbette Hair. Arlyce Dice, MD,FACG  Electronically Signed   RDK/MedQ  DD: 04/19/2006  DT: 04/19/2006  Job #: [id number]

## 2010-06-06 NOTE — Op Note (Signed)
NAME:  [name] [name], [name] [name]                          ACCOUNT NO.:  [id number]   MEDICAL RECORD NO.:  [id number]                   PATIENT TYPE:  INP   LOCATION:  5017                                 FACILITY:  [facility]   PHYSICIAN:  Sharolyn Douglas, M.D.                     DATE OF BIRTH:  September 14, 1945   DATE OF PROCEDURE:  07/24/2003  DATE OF DISCHARGE:                                 OPERATIVE REPORT   DIAGNOSIS:  Cervical spondylotic radiculopathy, C4-5 and C5-6.   PROCEDURES:  1. Anterior cervical diskectomy, C4-5 and C5-6, with decompression of the     nerve roots bilaterally.  2. Anterior cervical arthrodesis, C4-5 and C5-6, with placement of two     allograft prosthesis spacers packed with local autogenous bone graft.  3. Anterior cervical plating, C4 through C6, utilizing the Spinal Concepts     plating system.   SURGEON:  Sharolyn Douglas, M.D.   ASSISTANT:  Verlin Fester, P.A.   ANESTHESIA:  General endotracheal.   COMPLICATIONS:  None.   INDICATIONS:  The patient is a very pleasant 65 year old female with chronic  neck and bilateral upper extremity pain thought to be secondary to cervical  spondylotic radiculopathy at C4-5 and C5-6.  She has failed all nonoperative  treatment measures and has elected to undergo ACDF at C4-5 and C5-6 with  allograft and plating in hopes of improving her symptoms.  Risks, benefits,  and alternatives were extensively reviewed, and she elected to proceed.   PROCEDURE:  The patient was properly identified in the holding area and  taken to the operating room, underwent general endotracheal anesthesia  without difficulty, given prophylactic IV antibiotics.  Carefully positioned  on the operating room table with the Mayfield head rest, neck in slight  extension.  The neck was prepped and draped in the usual sterile fashion.  Five pounds of Holter traction was applied.  A 4 cm incision made at the  level of the cricoid cartilage in a transverse fashion in a  natural skin  crease on the left side.  Dissection was carried sharply through the  platysma.  The interval between the SCM and strap muscles medially was  developed down to the prevertebral space.  The esophagus, trachea, and  carotid sheath were identified and protected at all times.  The C4-5 and C5-  6 levels were easily identifiable by the anterior osteophytes.  Spinal  needle placed at C4-5.  Intraoperative x-ray taken to confirm this location.  The longus colli muscle was elevated out over the C4-5 and C5-6 disk spaces  bilaterally.  Deep Shadow Line retractor placed.  Microscope draped and  brought into the field.  Diskectomies carried out at C4-5 and C5-6 to the  posterior longitudinal ligament.  Disk spaces were very collapsed and  degenerative.  The uncovertebral joints were hypertrophied.  Caspar  distraction pins were placed in the C4,  C5, and C6 vertebral bodies.  Gentle  distraction was applied.  A high-speed bur used to remove the posterior  vertebral margins and taking down the uncovertebral joints.  Kerrison  punches used to undercut the posterior vertebral margins and complete wide  foraminotomies bilaterally.  Bleeding was controlled with FloSeal.  We then  placed 7 mm allograft prosthesis spacers which had been packed with local  bone graft obtained from the bur shavings.  These were carefully counter  sunk 1 mm into the C4-5 and C5-6 interspaces.  The Caspar distraction pins  were removed.  We then proceeded to place a 42 mm Spinal Concepts anterior  cervical plate, C4 through C6.  We utilized six 12 mm locking screws.  The  bone quality was somewhat soft.  The screw purchase was marginal.  We  ensured that the locking mechanisms engaged.  The wound was then irrigated.  The esophagus, trachea, and carotid sheath were examined.  There were no  apparent injuries.  Intraoperative x-ray showed appropriate positioning of  the screws and interbody grafts.  Deep Penrose drain  left in place.  The  platysma closed with interrupted 2-0 Vicryl, subcutaneous layer closed with  interrupted 3-0 Vicryl, followed by a running 4-0 subcuticular suture on the  skin edges.  Benzoin and Steri-Strips placed.  A sterile dressing applied,  soft collar placed.  The patient was extubated without difficulty,  transferred to the recovery room in stable condition able to move her upper  and lower extremities.                                               Sharolyn Douglas, M.D.    MC/MEDQ  D:  07/24/2003  T:  07/25/2003  Job:  [id number]

## 2010-06-10 ENCOUNTER — Other Ambulatory Visit (HOSPITAL_COMMUNITY): Payer: Self-pay | Admitting: Obstetrics and Gynecology

## 2010-06-10 DIAGNOSIS — Z1231 Encounter for screening mammogram for malignant neoplasm of breast: Secondary | ICD-10-CM

## 2010-07-01 ENCOUNTER — Other Ambulatory Visit: Payer: Self-pay | Admitting: Endocrinology

## 2010-07-01 DIAGNOSIS — E041 Nontoxic single thyroid nodule: Secondary | ICD-10-CM

## 2010-07-11 ENCOUNTER — Ambulatory Visit (HOSPITAL_COMMUNITY): Payer: Commercial Managed Care - PPO

## 2010-07-16 ENCOUNTER — Ambulatory Visit (HOSPITAL_COMMUNITY)
Admission: RE | Admit: 2010-07-16 | Discharge: 2010-07-16 | Disposition: A | Discharge status: Home / Self Care | Payer: Commercial Managed Care - PPO | Source: Ambulatory Visit | Attending: Obstetrics and Gynecology | Admitting: Obstetrics and Gynecology

## 2010-07-16 DIAGNOSIS — Z1231 Encounter for screening mammogram for malignant neoplasm of breast: Secondary | ICD-10-CM

## 2010-09-15 ENCOUNTER — Telehealth: Payer: Self-pay | Admitting: *Deleted

## 2010-09-15 ENCOUNTER — Encounter: Payer: Self-pay | Admitting: Internal Medicine

## 2010-09-15 ENCOUNTER — Encounter: Payer: Self-pay | Admitting: *Deleted

## 2010-09-15 ENCOUNTER — Ambulatory Visit (INDEPENDENT_AMBULATORY_CARE_PROVIDER_SITE_OTHER): Payer: Commercial Managed Care - PPO | Admitting: Internal Medicine

## 2010-09-15 DIAGNOSIS — F411 Generalized anxiety disorder: Secondary | ICD-10-CM

## 2010-09-15 DIAGNOSIS — E119 Type 2 diabetes mellitus without complications: Secondary | ICD-10-CM

## 2010-09-15 DIAGNOSIS — E039 Hypothyroidism, unspecified: Secondary | ICD-10-CM

## 2010-09-15 NOTE — Assessment & Plan Note (Addendum)
Note an issue at the present time, sleeping very well with alprazolam and Elavil. Occasionally take naps but not really somnolent throughout the day

## 2010-09-15 NOTE — Telephone Encounter (Signed)
Same Day [facility]st[facility]. 

## 2010-09-15 NOTE — Assessment & Plan Note (Signed)
TSH  slightly high on and off, eventually started on Synthroid 5 weeks ago by endocrinology.  See HPI---> she is extremely active, lately her heart rate is going higher than usual. No true syncope but some palpitations and mild dizzines. Recommend to discuss with endocrinology, related to synthroid? Also rec not to exercise to the point that her HR goes to the 150s ALSO  asked her to call me if symptoms persist, may need further workup and labs

## 2010-09-15 NOTE — Assessment & Plan Note (Signed)
Per Dr Balan 

## 2010-09-15 NOTE — Progress Notes (Signed)
  Subjective:    Patient ID: [name] [name], female    DOB: Jul 25, 1945, 65 y.o.   MRN: [id number]  HPI Six-month followup. The last office visit, she was seen by Dr. Talmage Nap, diabetes reportedly  well controlled, TSH was found to be slightly elevated and she was found to have a thyroid nodule. She was sent for a biopsy but the radiologist noted the nodule was gone. She was started on Synthroid about 5 weeks ago.  Past Medical History  Diagnosis Date  . Diabetes mellitus   . Hyperlipidemia   . Osteopenia   . Diverticulosis of colon   . GERD (gastroesophageal reflux disease)   . Abdominal pain     EGD and CTs neg, rx Amitriptiline (non ulcer dyspepsia) EGD 12/11.. s/p ballon dilitation  . Anxiety   . Solar lentigo     face; s/p excision 3/09. See Derm q 6 months  . Hypothyroidism      Dx 10-9 TSH 5.2 , then TSH normalized, eventually went up again 2012  . Trigeminal neuralgia     h/o   . Melanoma     h/o   Past Surgical History  Procedure Date  . Cervical laminectomy   . Cholecystectomy   . Abdominal hysterectomy   . Shoulder surgery     right  . Colon resection 1999    d/t diverticulitis  . Arm surgery     removed melaoma      Review of Systems She is very active, goes to thegym frequently. Has noted for the last 3 weeks that her heart rate is faster than usual and most important it  won't go down as quickly as it did before when she stops exercising. Slightly dizzy with exertion but not real presyncope. Yesterday after exercise her heart rate went up to the 150s. Was seen 04/2010 at this office by another provider, all GI symptoms resolve. Labs were normal. Denies chest pain or shortness of breath Has noted herself taking naps lately, admits to snoring but she wakes up rested in the morning and is not sleepy throughout the day. Xanax does help her sleep    Objective:   Physical Exam  Constitutional: She appears well-developed and well-nourished. No distress.    HENT:  Head: Normocephalic and atraumatic.  Cardiovascular: Regular rhythm and normal heart sounds.   Pulmonary/Chest: Effort normal and breath sounds normal.  Musculoskeletal: She exhibits no edema.  Skin: She is not diaphoretic.          Assessment & Plan:

## 2010-09-29 ENCOUNTER — Ambulatory Visit
Admission: RE | Admit: 2010-09-29 | Discharge: 2010-09-29 | Disposition: A | Discharge status: Home / Self Care | Payer: Commercial Managed Care - PPO | Source: Ambulatory Visit | Attending: Endocrinology | Admitting: Endocrinology

## 2010-09-29 DIAGNOSIS — E041 Nontoxic single thyroid nodule: Secondary | ICD-10-CM

## 2010-12-27 ENCOUNTER — Emergency Department (HOSPITAL_BASED_OUTPATIENT_CLINIC_OR_DEPARTMENT_OTHER)
Admission: EM | Admit: 2010-12-27 | Discharge: 2010-12-27 | Disposition: A | Discharge status: Home / Self Care | Payer: Medicare Other | Attending: Emergency Medicine | Admitting: Emergency Medicine

## 2010-12-27 ENCOUNTER — Encounter (HOSPITAL_BASED_OUTPATIENT_CLINIC_OR_DEPARTMENT_OTHER): Payer: Self-pay | Admitting: *Deleted

## 2010-12-27 DIAGNOSIS — S161XXA Strain of muscle, fascia and tendon at neck level, initial encounter: Secondary | ICD-10-CM

## 2010-12-27 DIAGNOSIS — X58XXXA Exposure to other specified factors, initial encounter: Secondary | ICD-10-CM | POA: Insufficient documentation

## 2010-12-27 DIAGNOSIS — S139XXA Sprain of joints and ligaments of unspecified parts of neck, initial encounter: Secondary | ICD-10-CM | POA: Insufficient documentation

## 2010-12-27 DIAGNOSIS — E785 Hyperlipidemia, unspecified: Secondary | ICD-10-CM | POA: Insufficient documentation

## 2010-12-27 DIAGNOSIS — K219 Gastro-esophageal reflux disease without esophagitis: Secondary | ICD-10-CM | POA: Insufficient documentation

## 2010-12-27 DIAGNOSIS — R51 Headache: Secondary | ICD-10-CM | POA: Insufficient documentation

## 2010-12-27 DIAGNOSIS — E119 Type 2 diabetes mellitus without complications: Secondary | ICD-10-CM | POA: Insufficient documentation

## 2010-12-27 DIAGNOSIS — M5412 Radiculopathy, cervical region: Secondary | ICD-10-CM | POA: Insufficient documentation

## 2010-12-27 MED ORDER — HYDROCODONE-ACETAMINOPHEN 5-325 MG PO TABS: 1.0000 | ORAL_TABLET | ORAL | Status: AC | PRN

## 2010-12-27 MED ORDER — KETOROLAC TROMETHAMINE 30 MG/ML IJ SOLN
30.0000 mg | Freq: Once | INTRAMUSCULAR | Status: AC
  Administered 2010-12-27: 30 mg via INTRAVENOUS
  Filled 2010-12-27: qty 1

## 2010-12-27 MED ORDER — DIAZEPAM 5 MG PO TABS: 5.0000 mg | ORAL_TABLET | Freq: Two times a day (BID) | ORAL | Status: AC

## 2010-12-27 MED ORDER — DIPHENHYDRAMINE HCL 50 MG/ML IJ SOLN
12.5000 mg | Freq: Once | INTRAMUSCULAR | Status: AC
  Administered 2010-12-27: 12.5 mg via INTRAVENOUS
  Filled 2010-12-27: qty 1

## 2010-12-27 MED ORDER — DIAZEPAM 5 MG PO TABS
5.0000 mg | ORAL_TABLET | Freq: Once | ORAL | Status: AC
  Administered 2010-12-27: 5 mg via ORAL
  Filled 2010-12-27: qty 1

## 2010-12-27 MED ORDER — PROMETHAZINE HCL 25 MG/ML IJ SOLN
12.5000 mg | Freq: Once | INTRAMUSCULAR | Status: AC
  Administered 2010-12-27: 12.5 mg via INTRAVENOUS
  Filled 2010-12-27: qty 1

## 2010-12-27 MED ORDER — SODIUM CHLORIDE 0.9 % IV BOLUS (SEPSIS)
1000.0000 mL | Freq: Once | INTRAVENOUS | Status: AC
  Administered 2010-12-27: 1000 mL via INTRAVENOUS

## 2010-12-27 NOTE — ED Notes (Signed)
MD at bedside. 

## 2010-12-27 NOTE — ED Notes (Signed)
Pt presents to ED today with HA and arm "tingling" for the last 4 days.  Pt has been taking Aleve with no relief in sx.  Pt has no neurological changes from baseline per pt and family

## 2010-12-27 NOTE — ED Provider Notes (Signed)
History     CSN: [id number] Arrival date & time: 12/27/2010  9:56 AM   First MD Initiated Contact with Patient 12/27/10 1012      Chief Complaint  Patient presents with  . Headache  . Extremity Weakness    (Consider location/radiation/quality/duration/timing/severity/associated sxs/prior treatment) HPI Comments: Patient presents with 5 days worth of posterior occipital headache that radiates down the particular the right side of her neck.  She also is noted subjective numbness and tingling on her right arm with radiation of her symptoms down her right arm.  No fevers.  No photophobia, phonophobia or nausea or vomiting.  Patient denies prior history of migraines.  She is use Tylenol and ibuprofen without good relief of her headaches.  She does have a history of a prior cervical fusion surgery.  Patient is a 65 y.o. female presenting with headaches and extremity weakness. The history is provided by the patient. No language interpreter was used.  Headache  This is a new problem. The current episode started more than 2 days ago. The problem occurs every few hours. The problem has not changed since onset.Associated with: Certain positions such as sitting up and it improves with walking and standing. The pain radiates to the right arm. Pertinent negatives include no anorexia, no fever, no malaise/fatigue, no chest pressure, no near-syncope, no orthopnea, no palpitations, no syncope, no shortness of breath, no nausea and no vomiting. She has tried acetaminophen and NSAIDs for the symptoms.  Extremity Weakness Associated symptoms include headaches. Pertinent negatives include no chest pain, no abdominal pain and no shortness of breath.    Past Medical History  Diagnosis Date  . Diabetes mellitus   . Hyperlipidemia   . Osteopenia   . Diverticulosis of colon   . GERD (gastroesophageal reflux disease)   . Abdominal pain     EGD and CTs neg, rx Amitriptiline (non ulcer dyspepsia) EGD 12/11.. s/p  ballon dilitation  . Anxiety   . Solar lentigo     face; s/p excision 3/09. See Derm q 6 months  . Hypothyroidism      Dx 10-9 TSH 5.2 , then TSH normalized, eventually went up again 2012  . Trigeminal neuralgia     h/o   . Melanoma     h/o    Past Surgical History  Procedure Date  . Cervical laminectomy   . Cholecystectomy   . Abdominal hysterectomy   . Shoulder surgery     right  . Colon resection 1999    d/t diverticulitis  . Arm surgery     removed melaoma    Family History  Problem Relation Age of Onset  . Colon cancer Neg Hx   . Prostate cancer Father   . Breast cancer Neg Hx   . Diabetes Neg Hx   . Heart attack      GF in his 79s  . Melanoma      uncle  . Endometrial cancer Sister     clear cell carcinoma    History  Substance Use Topics  . Smoking status: Former Games developer  . Smokeless tobacco: Not on file  . Alcohol Use: Yes     socially    OB History    Grav Para Term Preterm Abortions TAB SAB Ect Mult Living                  Review of Systems  Constitutional: Negative.  Negative for fever, chills and malaise/fatigue.  Eyes: Negative.  Negative for discharge  and redness.  Respiratory: Negative.  Negative for cough and shortness of breath.   Cardiovascular: Negative.  Negative for chest pain, palpitations, orthopnea, syncope and near-syncope.  Gastrointestinal: Negative.  Negative for nausea, vomiting, abdominal pain, diarrhea and anorexia.  Genitourinary: Negative.  Negative for dysuria and vaginal discharge.  Musculoskeletal: Positive for extremity weakness. Negative for back pain.  Skin: Negative.  Negative for color change and rash.  Neurological: Positive for headaches. Negative for syncope.  Hematological: Negative.  Negative for adenopathy.  Psychiatric/Behavioral: Negative.  Negative for confusion.  All other systems reviewed and are negative.    Allergies  Ceftriaxone sodium  Home Medications   Current Outpatient Rx  Name Route  Sig Dispense Refill  . ALPRAZOLAM 0.5 MG PO TABS Oral Take 0.5 mg by mouth at bedtime as needed.      [name] AMITRIPTYLINE HCL 25 MG PO TABS Oral Take 25 mg by mouth at bedtime.      [name] VITAMIN D3 2000 UNITS PO TABS Oral Take by mouth daily.      . CYCLOSPORINE 0.05 % OP EMUL Both Eyes Place 1 drop into both eyes daily.      [name] LEVOTHYROXINE SODIUM 25 MCG PO TABS Oral Take 25 mcg by mouth daily.      [name] ONE-DAILY MULTI VITAMINS PO TABS Oral Take 1 tablet by mouth daily.      [name] ROSUVASTATIN CALCIUM 10 MG PO TABS Oral Take 10 mg by mouth daily.        BP 116/59  Pulse 70  Temp(Src) 98.4 F (36.9 C) (Oral)  Resp 18  Wt 125 lb (56.7 kg)  SpO2 99%  Physical Exam  Constitutional: She is oriented to person, place, and time. She appears well-developed and well-nourished.  Non-toxic appearance. She does not have a sickly appearance.  HENT:  Head: Normocephalic and atraumatic.  Eyes: Conjunctivae, EOM and lids are normal. Pupils are equal, round, and reactive to light. No scleral icterus.  Neck: Trachea normal and normal range of motion. Neck supple.  Cardiovascular: Normal rate, regular rhythm and normal heart sounds.   Pulmonary/Chest: Effort normal and breath sounds normal. No respiratory distress. She has no wheezes. She has no rales.  Abdominal: Soft. Normal appearance. There is no tenderness. There is no rebound, no guarding and no CVA tenderness.  Musculoskeletal: Normal range of motion.       No focal C-spine tenderness on exam.  Patient has tenderness over her right paraspinal cervical muscles.  Palpable radial pulse and right arm.  Capillary refill in his fingers less than 2 seconds.  Sensation is symmetrical between right arm and left arm.  Patient has symmetric strength in being able to raise both arms up above shoulder height.  Neurological: She is alert and oriented to person, place, and time. She has normal strength.  Skin: Skin is warm, dry and intact. No rash noted.  Psychiatric: She  has a normal mood and affect. Her behavior is normal. Judgment and thought content normal.    ED Course  Procedures (including critical care time)  Labs Reviewed - No data to display No results found.   No diagnosis found.    MDM  Patient present with headache and neck pain with tingling on her arm that seems to be consistent with a cervical muscle strain and radiculopathy.  Patient does have known prior cervical fusion surgery.  Patient has no neurologic deficits on exam and no symptoms consistent for infection at this point in time.  Patient's  symptoms have improved her pain medications here I feel she is safe for discharge home with followup with her spine surgeon later this week if not improving.        Nat Christen, MD 12/27/10 [phone]

## 2011-01-05 ENCOUNTER — Other Ambulatory Visit: Payer: Self-pay | Admitting: Orthopaedic Surgery

## 2011-01-05 DIAGNOSIS — M542 Cervicalgia: Secondary | ICD-10-CM

## 2011-01-08 ENCOUNTER — Ambulatory Visit
Admission: RE | Admit: 2011-01-08 | Discharge: 2011-01-08 | Disposition: A | Discharge status: Home / Self Care | Payer: Commercial Managed Care - PPO | Source: Ambulatory Visit | Attending: Orthopaedic Surgery | Admitting: Orthopaedic Surgery

## 2011-01-08 DIAGNOSIS — M542 Cervicalgia: Secondary | ICD-10-CM

## 2011-01-22 DIAGNOSIS — M542 Cervicalgia: Secondary | ICD-10-CM | POA: Diagnosis not present

## 2011-01-27 DIAGNOSIS — M47812 Spondylosis without myelopathy or radiculopathy, cervical region: Secondary | ICD-10-CM | POA: Diagnosis not present

## 2011-01-27 DIAGNOSIS — M542 Cervicalgia: Secondary | ICD-10-CM | POA: Diagnosis not present

## 2011-01-28 DIAGNOSIS — M810 Age-related osteoporosis without current pathological fracture: Secondary | ICD-10-CM | POA: Diagnosis not present

## 2011-01-28 DIAGNOSIS — G609 Hereditary and idiopathic neuropathy, unspecified: Secondary | ICD-10-CM | POA: Diagnosis not present

## 2011-01-28 DIAGNOSIS — E039 Hypothyroidism, unspecified: Secondary | ICD-10-CM | POA: Diagnosis not present

## 2011-01-28 DIAGNOSIS — E119 Type 2 diabetes mellitus without complications: Secondary | ICD-10-CM | POA: Diagnosis not present

## 2011-01-29 ENCOUNTER — Emergency Department (INDEPENDENT_AMBULATORY_CARE_PROVIDER_SITE_OTHER): Payer: Medicare Other

## 2011-01-29 ENCOUNTER — Emergency Department (HOSPITAL_BASED_OUTPATIENT_CLINIC_OR_DEPARTMENT_OTHER)
Admission: EM | Admit: 2011-01-29 | Discharge: 2011-01-29 | Disposition: A | Discharge status: Home / Self Care | Payer: Medicare Other | Attending: Emergency Medicine | Admitting: Emergency Medicine

## 2011-01-29 ENCOUNTER — Encounter (HOSPITAL_BASED_OUTPATIENT_CLINIC_OR_DEPARTMENT_OTHER): Payer: Self-pay | Admitting: *Deleted

## 2011-01-29 DIAGNOSIS — R05 Cough: Secondary | ICD-10-CM | POA: Diagnosis not present

## 2011-01-29 DIAGNOSIS — B9789 Other viral agents as the cause of diseases classified elsewhere: Secondary | ICD-10-CM | POA: Diagnosis not present

## 2011-01-29 DIAGNOSIS — K219 Gastro-esophageal reflux disease without esophagitis: Secondary | ICD-10-CM | POA: Diagnosis not present

## 2011-01-29 DIAGNOSIS — Z79899 Other long term (current) drug therapy: Secondary | ICD-10-CM | POA: Insufficient documentation

## 2011-01-29 DIAGNOSIS — J3489 Other specified disorders of nose and nasal sinuses: Secondary | ICD-10-CM | POA: Diagnosis not present

## 2011-01-29 DIAGNOSIS — R51 Headache: Secondary | ICD-10-CM | POA: Diagnosis not present

## 2011-01-29 DIAGNOSIS — E119 Type 2 diabetes mellitus without complications: Secondary | ICD-10-CM | POA: Insufficient documentation

## 2011-01-29 DIAGNOSIS — R059 Cough, unspecified: Secondary | ICD-10-CM

## 2011-01-29 DIAGNOSIS — M531 Cervicobrachial syndrome: Secondary | ICD-10-CM | POA: Diagnosis not present

## 2011-01-29 DIAGNOSIS — E785 Hyperlipidemia, unspecified: Secondary | ICD-10-CM | POA: Diagnosis not present

## 2011-01-29 DIAGNOSIS — M5481 Occipital neuralgia: Secondary | ICD-10-CM

## 2011-01-29 DIAGNOSIS — B349 Viral infection, unspecified: Secondary | ICD-10-CM

## 2011-01-29 MED ORDER — GABAPENTIN 300 MG PO CAPS: 300.0000 mg | ORAL_CAPSULE | Freq: Three times a day (TID) | ORAL | Status: DC

## 2011-01-29 MED ORDER — GABAPENTIN 600 MG PO TABS
ORAL_TABLET | ORAL | Status: AC
  Administered 2011-01-29: 300 mg
  Filled 2011-01-29: qty 1

## 2011-01-29 MED ORDER — SODIUM CHLORIDE 0.9 % IV BOLUS (SEPSIS)
1000.0000 mL | Freq: Once | INTRAVENOUS | Status: AC
  Administered 2011-01-29: 1000 mL via INTRAVENOUS

## 2011-01-29 MED ORDER — GABAPENTIN 300 MG PO CAPS
300.0000 mg | ORAL_CAPSULE | Freq: Once | ORAL | Status: DC
  Filled 2011-01-29: qty 1

## 2011-01-29 MED ORDER — MORPHINE SULFATE 4 MG/ML IJ SOLN
4.0000 mg | Freq: Once | INTRAMUSCULAR | Status: AC
  Administered 2011-01-29: 4 mg via INTRAVENOUS
  Filled 2011-01-29: qty 1

## 2011-01-29 MED ORDER — METOCLOPRAMIDE HCL 5 MG/ML IJ SOLN
10.0000 mg | Freq: Once | INTRAMUSCULAR | Status: AC
  Administered 2011-01-29: 10 mg via INTRAVENOUS
  Filled 2011-01-29: qty 2

## 2011-01-29 MED ORDER — KETOROLAC TROMETHAMINE 30 MG/ML IJ SOLN
30.0000 mg | Freq: Once | INTRAMUSCULAR | Status: AC
  Administered 2011-01-29: 30 mg via INTRAVENOUS
  Filled 2011-01-29: qty 1

## 2011-01-29 MED ORDER — HYDROCODONE-ACETAMINOPHEN 5-500 MG PO TABS: 1.0000 | ORAL_TABLET | Freq: Four times a day (QID) | ORAL | Status: DC | PRN

## 2011-01-29 MED ORDER — DIPHENHYDRAMINE HCL 50 MG/ML IJ SOLN
25.0000 mg | Freq: Once | INTRAMUSCULAR | Status: DC
  Filled 2011-01-29: qty 1

## 2011-01-29 NOTE — Discharge Instructions (Signed)
Occipital Neuralgia Neuralgias are attacks of sharp stabbing pain. They may be intermittent (comes and goes) or constant in nature. They may be brief attacks that last seconds to minutes and may come back for days to weeks. The neuralgias can occur as a result of a herpes zoster (shingles), chickenpox infection, or even following a herpes simplex infection (cold sore). TYPES OF NEURALGIA  When these pains are located in the back of the head and neck they are called occipital neuralgias.   When the pain is located between ribs it is called intercostal neuralgia.   When the pain is located in the face it is called trigeminal neuralgia. This is the most common neuralgia. It causes sharp, shock like pain on one side of your face.  The neuralgias, which follow herpes zoster infections, often produce a constant burning pain. They may last from weeks to months and even years. The attacks of pain may come from injury or inflammation (irritation) to a nerve. Often the cause is unknown. The episodes of pain may be caused by light touch, movement, or even eating and sneezing. Usually these neuralgias occur after age 27. The neuralgias following shingles and trigeminal neuralgia are the most common. Although painful, these episodes do not threaten life and tend to lessen as we grow older. TREATMENT  There are many medications that may be helpful in the treatment of this disorder. Sometimes several medications may have to be tried before the right combination can be found for you. Some of these medications are:  Only take over-the-counter or prescription medications for pain, discomfort, or fever as directed by your caregiver.   Narcotic medications may be used to control the pain.   Antidepressants and medications used in epilepsy (seizure disorders) may be useful.  LET YOUR CAREGIVER KNOW ABOUT:  If you do not obtain relief from medications.   Problems that are getting worse rather than better.    Troubling side effects that you think are coming from the medication.  Do not be discouraged if you do not obtain instant relief from the medications or help given you. Your caregiver can help you get through these episodes of pain with some persistence (continued trying) on your part also. Document Released: 12/30/2000 Document Revised: 09/17/2010 Document Reviewed: 01/05/2005 [facility] Patient Information 2012 [place], [place].

## 2011-01-29 NOTE — ED Provider Notes (Signed)
History     CSN: [id number]  Arrival date & time 01/29/11  2041   First MD Initiated Contact with Patient 01/29/11 2151      Chief Complaint  Patient presents with  . Headache  . Cough  . Nasal Congestion    (Consider location/radiation/quality/duration/timing/severity/associated sxs/prior treatment) Patient is a 66 y.o. female presenting with headaches and cough. The history is provided by the patient. No language interpreter was used.  Headache  This is a chronic problem. The current episode started more than 1 week ago (4 weeks ago). The problem occurs constantly. The problem has been gradually worsening. The headache is associated with bright light and loud noise. The pain is located in the frontal and bilateral region. The quality of the pain is described as throbbing. The pain is moderate. The pain does not radiate. Pertinent negatives include no anorexia, no fever, no near-syncope, no palpitations, no shortness of breath, no nausea and no vomiting. She has tried acetaminophen for the symptoms. The treatment provided no relief.  Cough This is a new problem. The current episode started 2 days ago. The problem occurs constantly. The problem has not changed since onset.The cough is non-productive. There has been no fever. Associated symptoms include headaches. Pertinent negatives include no chest pain, no chills, no rhinorrhea, no sore throat, no myalgias and no shortness of breath. She has tried nothing for the symptoms. She is not a smoker.    Past Medical History  Diagnosis Date  . Diabetes mellitus   . Hyperlipidemia   . Osteopenia   . Diverticulosis of colon   . GERD (gastroesophageal reflux disease)   . Abdominal pain     EGD and CTs neg, rx Amitriptiline (non ulcer dyspepsia) EGD 12/11.. s/p ballon dilitation  . Anxiety   . Solar lentigo     face; s/p excision 3/09. See Derm q 6 months  . Hypothyroidism      Dx 10-9 TSH 5.2 , then TSH normalized, eventually went up  again 2012  . Trigeminal neuralgia     h/o   . Melanoma     h/o    Past Surgical History  Procedure Date  . Cervical laminectomy   . Cholecystectomy   . Abdominal hysterectomy   . Shoulder surgery     right  . Colon resection 1999    d/t diverticulitis  . Arm surgery     removed melaoma    Family History  Problem Relation Age of Onset  . Colon cancer Neg Hx   . Prostate cancer Father   . Breast cancer Neg Hx   . Diabetes Neg Hx   . Heart attack      GF in his 43s  . Melanoma      uncle  . Endometrial cancer Sister     clear cell carcinoma    History  Substance Use Topics  . Smoking status: Former Games developer  . Smokeless tobacco: Not on file  . Alcohol Use: Yes     socially    OB History    Grav Para Term Preterm Abortions TAB SAB Ect Mult Living                  Review of Systems  Constitutional: Negative for fever, chills, activity change, appetite change and fatigue.  HENT: Positive for neck pain. Negative for congestion, sore throat, rhinorrhea and neck stiffness.   Respiratory: Positive for cough. Negative for shortness of breath.   Cardiovascular: Negative for chest pain,  palpitations and near-syncope.  Gastrointestinal: Negative for nausea, vomiting, abdominal pain and anorexia.  Genitourinary: Negative for dysuria, urgency, frequency and flank pain.  Musculoskeletal: Negative for myalgias, back pain and arthralgias.  Neurological: Positive for headaches. Negative for dizziness, weakness, light-headedness and numbness.  All other systems reviewed and are negative.    Allergies  Ceftriaxone sodium and Benadryl  Home Medications   Current Outpatient Rx  Name Route Sig Dispense Refill  . ACETAMINOPHEN 500 MG PO TABS Oral Take 1,000 mg by mouth 3 (three) times daily as needed. For headache    . ALPRAZOLAM 0.5 MG PO TABS Oral Take 0.5 mg by mouth at bedtime as needed. For sleep    . AMITRIPTYLINE HCL 25 MG PO TABS Oral Take 12.5 mg by mouth at  bedtime.     [name] VITAMIN D3 2000 UNITS PO TABS Oral Take 1 tablet by mouth daily.     . CYCLOSPORINE 0.05 % OP EMUL Both Eyes Place 1 drop into both eyes 2 (two) times daily.     [name] LEVOTHYROXINE SODIUM 25 MCG PO TABS Oral Take 25 mcg by mouth daily.      [name] ONE-DAILY MULTI VITAMINS PO TABS Oral Take 1 tablet by mouth daily.      [name] ROSUVASTATIN CALCIUM 10 MG PO TABS Oral Take 10 mg by mouth daily.      [name] GABAPENTIN 300 MG PO CAPS Oral Take 1 capsule (300 mg total) by mouth 3 (three) times daily. 90 capsule 0  . HYDROCODONE-ACETAMINOPHEN 5-500 MG PO TABS Oral Take 1-2 tablets by mouth every 6 (six) hours as needed for pain. 10 tablet 0    BP 132/75  Pulse 88  Temp(Src) 98.2 F (36.8 C) (Oral)  Resp 22  SpO2 100%  Physical Exam  Nursing note and vitals reviewed. Constitutional: She is oriented to person, place, and time. She appears well-developed and well-nourished. No distress.  HENT:  Head: Normocephalic and atraumatic.  Mouth/Throat: Oropharynx is clear and moist.  Eyes: Conjunctivae and EOM are normal. Pupils are equal, round, and reactive to light.  Neck: Normal range of motion. Neck supple.  Cardiovascular: Normal rate, regular rhythm, normal heart sounds and intact distal pulses.  Exam reveals no gallop and no friction rub.   No murmur heard. Pulmonary/Chest: Effort normal and breath sounds normal.  Abdominal: Soft. Bowel sounds are normal. There is no tenderness.  Musculoskeletal: Normal range of motion. She exhibits no tenderness.  Neurological: She is alert and oriented to person, place, and time. She has normal strength. No cranial nerve deficit or sensory deficit.  Skin: Skin is warm. No rash noted.    ED Course  Procedures (including critical care time)  Labs Reviewed - No data to display Dg Chest 2 View  01/29/2011  *RADIOLOGY REPORT*  Clinical Data: Cough and headache.  CHEST - 2 VIEW  Comparison: Multiple priors dating back to 07/11/2007.  Findings: Lower cervical  ACDF.  Linear scarring is present in the lateral left upper lobe on the frontal view.  No airspace disease. No effusion. Cholecystectomy clips are present in the right upper quadrant.  Cardiopericardial silhouette and mediastinal contours appear within normal limits.  No pneumothorax is identified in this patient with remote history of pneumothorax.  IMPRESSION: No active cardiopulmonary disease.  Original Report Authenticated By: Andreas Newport, M.D.   Ct Head Wo Contrast  01/29/2011  *RADIOLOGY REPORT*  Clinical Data: Headache , no injury  CT HEAD WITHOUT CONTRAST  Technique:  Contiguous axial images  were obtained from the base of the skull through the vertex without contrast.  Comparison: Head CT 04/08/2007 the  Findings: No acute intracranial hemorrhage.  No focal mass lesion. No CT evidence of acute infarction.   No midline shift or mass effect.  No hydrocephalus.  Basilar cisterns are patent. Paranasal sinuses and mastoid air cells are clear.  Orbits are normal.  IMPRESSION: No acute intracranial findings.  No change from prior.  Original Report Authenticated By: Genevive Bi, M.D.     1. Occipital neuralgia   2. Headache   3. Viral syndrome       MDM  Headache consistent with occipital neuralgia as she describes worsening of her pain when her occiput versus up against the pillow. CT is negative. I instructed her to followup with neurology for which I provided followup. I will prescribe her Neurontin and Vicodin for pain. I have no concern that this is a malignant cause of headache as it has been chronic in nature and was gradual in onset. She has no neurologic symptoms. Remainder of her symptoms are secondary to viral illness. She's instructed to followup with both her primary care physician and her neurologist.  Provided sx for which to return        Dayton Bailiff, MD 01/29/11 2340

## 2011-01-29 NOTE — ED Notes (Signed)
Pt states that she was seen here 4 weeks ago for HA states that HA has never resolved pt has followed up as instructed pt also with cough and congestion

## 2011-02-02 ENCOUNTER — Ambulatory Visit (INDEPENDENT_AMBULATORY_CARE_PROVIDER_SITE_OTHER): Payer: Medicare Other | Admitting: Internal Medicine

## 2011-02-02 ENCOUNTER — Encounter: Payer: Self-pay | Admitting: Internal Medicine

## 2011-02-02 VITALS — BP 120/70 | HR 94 | Temp 98.4°F | Ht 64.5 in | Wt 126.0 lb

## 2011-02-02 DIAGNOSIS — R519 Headache, unspecified: Secondary | ICD-10-CM | POA: Insufficient documentation

## 2011-02-02 DIAGNOSIS — R05 Cough: Secondary | ICD-10-CM

## 2011-02-02 DIAGNOSIS — R51 Headache: Secondary | ICD-10-CM

## 2011-02-02 DIAGNOSIS — R059 Cough, unspecified: Secondary | ICD-10-CM | POA: Diagnosis not present

## 2011-02-02 LAB — CBC WITH DIFFERENTIAL/PLATELET
Basophils Absolute: 0 10*3/uL (ref 0.0–0.1)
Basophils Relative: 0.4 % (ref 0.0–3.0)
Eosinophils Absolute: 0.1 10*3/uL (ref 0.0–0.7)
Eosinophils Relative: 1.1 % (ref 0.0–5.0)
HCT: 42 % (ref 36.0–46.0)
Hemoglobin: 14.4 g/dL (ref 12.0–15.0)
Lymphocytes Relative: 20.9 % (ref 12.0–46.0)
Lymphs Abs: 1.4 10*3/uL (ref 0.7–4.0)
MCHC: 34.3 g/dL (ref 30.0–36.0)
MCV: 95 fl (ref 78.0–100.0)
Monocytes Absolute: 0.7 10*3/uL (ref 0.1–1.0)
Monocytes Relative: 9.5 % (ref 3.0–12.0)
Neutro Abs: 4.7 10*3/uL (ref 1.4–7.7)
Neutrophils Relative %: 68.1 % (ref 43.0–77.0)
Platelets: 213 10*3/uL (ref 150.0–400.0)
RBC: 4.42 Mil/uL (ref 3.87–5.11)
RDW: 12.6 % (ref 11.5–14.6)
WBC: 6.9 10*3/uL (ref 4.5–10.5)

## 2011-02-02 LAB — SEDIMENTATION RATE: Sed Rate: 19 mm/hr (ref 0–22)

## 2011-02-02 MED ORDER — GABAPENTIN 600 MG PO TABS: 600.0000 mg | ORAL_TABLET | Freq: Three times a day (TID) | ORAL | Status: DC

## 2011-02-02 MED ORDER — ALPRAZOLAM 0.5 MG PO TABS: 0.5000 mg | ORAL_TABLET | Freq: Every evening | ORAL | Status: DC | PRN

## 2011-02-02 MED ORDER — GABAPENTIN 300 MG PO CAPS: 300.0000 mg | ORAL_CAPSULE | Freq: Four times a day (QID) | ORAL | Status: DC

## 2011-02-02 MED ORDER — AZITHROMYCIN 250 MG PO TABS: ORAL_TABLET | ORAL | Status: AC

## 2011-02-02 NOTE — Assessment & Plan Note (Signed)
Likely bronchitis , see instructions

## 2011-02-02 NOTE — Patient Instructions (Addendum)
Rest, fluids , tylenol For cough, take Mucinex DM twice a day as needed  take the antibiotic as prescribed ----> zithromax Call if no better in few days Call anytime if the symptoms are severe ---------------------------------------------------- Neurontin 600 mg  3 times a day ----------------------------------------------------

## 2011-02-02 NOTE — Assessment & Plan Note (Addendum)
HA x several weeks S/p ER x2, ortho eval, local injection, CT head and MRI of the neck Etiology likely cervicogenic or occipital neuralgia, responding well to low dose of neurontin Plan: Increase neurontin dose from 300 tid to 600 tid  Sed rate , CBC refer to neurology b/c pt unsure if likes to proceed w/ further local injections , if neurology agrees w/ diagnosis she may go ahead and get that done

## 2011-02-02 NOTE — Progress Notes (Signed)
  Subjective:    Patient ID: [name] [name], female    DOB: 1945/04/07, 66 y.o.   MRN: [id number]  HPI Since her last office visit, several things happened: Went to the ER last month with headaches, some radiation to the R arm and tingling.  subsequently was seen by orthopedic surgery, MRI of the neck was done, she was prescribed prednisone by mouth and got local steroid injection about 10 days ago. Blood sugars went up after the steroid shot. She didn't get much relief. Went to the ER on 1- 10- 13 , head CT was negative, was diagnosed with possible occipital neuralgia and put on Neurontin 300 mg 3 times a day. Neurontin is helping to some extent. Additionally he has cough for about a week   ER visits and imaging reports were  reviewed     Past Medical History  Diagnosis Date  . Diabetes mellitus   . Hyperlipidemia   . Osteopenia   . Diverticulosis of colon   . GERD (gastroesophageal reflux disease)   . Abdominal pain     EGD and CTs neg, rx Amitriptiline (non ulcer dyspepsia) EGD 12/11.. s/p ballon dilitation  . Anxiety   . Solar lentigo     face; s/p excision 3/09. See Derm q 6 months  . Hypothyroidism      Dx 10-9 TSH 5.2 , then TSH normalized, eventually went up again 2012  . Trigeminal neuralgia     h/o   . Melanoma     h/o     Review of Systems  denies any fever chills No weight loss but reports mild myalgias in the arms Mild sinus discharge, yellow in color. Tingling in extremities has decreased since she had a local injection.     Objective:   Physical Exam  Constitutional: She is oriented to person, place, and time. She appears well-developed and well-nourished. No distress.  Eyes: EOM are normal. Pupils are equal, round, and reactive to light.  Neck: Normal range of motion. Neck supple.  Cardiovascular: Normal rate, regular rhythm and normal heart sounds.   No murmur heard. Pulmonary/Chest: Effort normal and breath sounds normal. No respiratory distress.  She has no wheezes. She has no rales.  Neurological: She is alert and oriented to person, place, and time.       Gait, speech, motor and DTRs normal  Skin: Skin is warm and dry. She is not diaphoretic.  Psychiatric: She has a normal mood and affect. Her behavior is normal. Judgment and thought content normal.      Assessment & Plan:  Today , I spent more than 25 min with the patient, due to extensive chart review

## 2011-02-05 ENCOUNTER — Encounter: Payer: Self-pay | Admitting: Internal Medicine

## 2011-02-12 DIAGNOSIS — M47812 Spondylosis without myelopathy or radiculopathy, cervical region: Secondary | ICD-10-CM | POA: Diagnosis not present

## 2011-02-12 DIAGNOSIS — M542 Cervicalgia: Secondary | ICD-10-CM | POA: Diagnosis not present

## 2011-02-25 DIAGNOSIS — M4712 Other spondylosis with myelopathy, cervical region: Secondary | ICD-10-CM | POA: Diagnosis not present

## 2011-02-25 DIAGNOSIS — M542 Cervicalgia: Secondary | ICD-10-CM | POA: Diagnosis not present

## 2011-03-02 ENCOUNTER — Other Ambulatory Visit: Payer: Self-pay | Admitting: *Deleted

## 2011-03-02 DIAGNOSIS — M542 Cervicalgia: Secondary | ICD-10-CM | POA: Diagnosis not present

## 2011-03-02 DIAGNOSIS — M4712 Other spondylosis with myelopathy, cervical region: Secondary | ICD-10-CM | POA: Diagnosis not present

## 2011-03-02 MED ORDER — GABAPENTIN 600 MG PO TABS: 600.0000 mg | ORAL_TABLET | Freq: Three times a day (TID) | ORAL | Status: DC

## 2011-03-02 NOTE — Telephone Encounter (Signed)
02-02-11 last ov, last filled #[phone]

## 2011-03-02 NOTE — Telephone Encounter (Signed)
90, 6 RF 

## 2011-03-02 NOTE — Telephone Encounter (Signed)
Addended by: Verdene Rio on: 03/02/2011 09:35 AM   Modules accepted: Orders

## 2011-03-02 NOTE — Telephone Encounter (Signed)
Rx sent 

## 2011-03-04 DIAGNOSIS — M4712 Other spondylosis with myelopathy, cervical region: Secondary | ICD-10-CM | POA: Diagnosis not present

## 2011-03-04 DIAGNOSIS — M542 Cervicalgia: Secondary | ICD-10-CM | POA: Diagnosis not present

## 2011-03-09 DIAGNOSIS — M4712 Other spondylosis with myelopathy, cervical region: Secondary | ICD-10-CM | POA: Diagnosis not present

## 2011-03-09 DIAGNOSIS — M542 Cervicalgia: Secondary | ICD-10-CM | POA: Diagnosis not present

## 2011-03-10 ENCOUNTER — Other Ambulatory Visit: Payer: Self-pay | Admitting: *Deleted

## 2011-03-10 MED ORDER — GABAPENTIN 600 MG PO TABS: 600.0000 mg | ORAL_TABLET | Freq: Three times a day (TID) | ORAL | Status: DC

## 2011-03-13 DIAGNOSIS — M4712 Other spondylosis with myelopathy, cervical region: Secondary | ICD-10-CM | POA: Diagnosis not present

## 2011-03-13 DIAGNOSIS — M542 Cervicalgia: Secondary | ICD-10-CM | POA: Diagnosis not present

## 2011-03-16 DIAGNOSIS — M542 Cervicalgia: Secondary | ICD-10-CM | POA: Diagnosis not present

## 2011-03-16 DIAGNOSIS — M4712 Other spondylosis with myelopathy, cervical region: Secondary | ICD-10-CM | POA: Diagnosis not present

## 2011-03-17 DIAGNOSIS — H16219 Exposure keratoconjunctivitis, unspecified eye: Secondary | ICD-10-CM | POA: Diagnosis not present

## 2011-03-17 DIAGNOSIS — S058X9A Other injuries of unspecified eye and orbit, initial encounter: Secondary | ICD-10-CM | POA: Diagnosis not present

## 2011-03-18 ENCOUNTER — Ambulatory Visit (INDEPENDENT_AMBULATORY_CARE_PROVIDER_SITE_OTHER): Payer: Medicare Other | Admitting: Internal Medicine

## 2011-03-18 VITALS — BP 118/76 | HR 83 | Temp 98.4°F | Ht 64.0 in | Wt 126.0 lb

## 2011-03-18 DIAGNOSIS — Z0000 Encounter for general adult medical examination without abnormal findings: Secondary | ICD-10-CM

## 2011-03-18 DIAGNOSIS — E785 Hyperlipidemia, unspecified: Secondary | ICD-10-CM

## 2011-03-18 DIAGNOSIS — R51 Headache: Secondary | ICD-10-CM

## 2011-03-18 DIAGNOSIS — M949 Disorder of cartilage, unspecified: Secondary | ICD-10-CM

## 2011-03-18 DIAGNOSIS — Z23 Encounter for immunization: Secondary | ICD-10-CM

## 2011-03-18 DIAGNOSIS — E119 Type 2 diabetes mellitus without complications: Secondary | ICD-10-CM

## 2011-03-18 DIAGNOSIS — E039 Hypothyroidism, unspecified: Secondary | ICD-10-CM

## 2011-03-18 DIAGNOSIS — M899 Disorder of bone, unspecified: Secondary | ICD-10-CM

## 2011-03-18 LAB — COMPREHENSIVE METABOLIC PANEL
ALT: 20 U/L (ref 0–35)
AST: 23 U/L (ref 0–37)
Albumin: 4.5 g/dL (ref 3.5–5.2)
Alkaline Phosphatase: 68 U/L (ref 39–117)
BUN: 25 mg/dL — ABNORMAL HIGH (ref 6–23)
CO2: 31 mEq/L (ref 19–32)
Calcium: 9.6 mg/dL (ref 8.4–10.5)
Chloride: 105 mEq/L (ref 96–112)
Creatinine, Ser: 0.7 mg/dL (ref 0.4–1.2)
GFR: 83.67 mL/min (ref >60.00)
Glucose, Bld: 107 mg/dL — ABNORMAL HIGH (ref 70–99)
Potassium: 5.1 mEq/L (ref 3.5–5.1)
Sodium: 140 mEq/L (ref 135–145)
Total Bilirubin: 0.6 mg/dL (ref 0.3–1.2)
Total Protein: 7.1 g/dL (ref 6.0–8.3)

## 2011-03-18 LAB — LIPID PANEL
Cholesterol: 181 mg/dL (ref 0–200)
HDL: 89.6 mg/dL (ref >39.00)
LDL Cholesterol: 80 mg/dL (ref 0–99)
Total CHOL/HDL Ratio: 2
Triglycerides: 56 mg/dL (ref 0.0–149.0)
VLDL: 11.2 mg/dL (ref 0.0–40.0)

## 2011-03-18 MED ORDER — ZOSTER VACCINE LIVE 19400 UNT/0.65ML ~~LOC~~ SOLR: 0.6500 mL | Freq: Once | SUBCUTANEOUS | Status: AC

## 2011-03-18 MED ORDER — ROSUVASTATIN CALCIUM 10 MG PO TABS: 10.0000 mg | ORAL_TABLET | Freq: Every day | ORAL | Status: DC

## 2011-03-18 MED ORDER — ALPRAZOLAM 0.5 MG PO TABS: 0.5000 mg | ORAL_TABLET | Freq: Every evening | ORAL | Status: DC | PRN

## 2011-03-18 NOTE — Assessment & Plan Note (Signed)
Good med compliance , labs  

## 2011-03-18 NOTE — Assessment & Plan Note (Addendum)
Per Dr Ballan 

## 2011-03-18 NOTE — Assessment & Plan Note (Signed)
Improved w/ higher neurontin dose, to see neuro soon

## 2011-03-18 NOTE — Assessment & Plan Note (Signed)
Per Dr Ballan 

## 2011-03-18 NOTE — Progress Notes (Signed)
  Subjective:    Patient ID: [name] [name], female    DOB: 1945/02/10, 66 y.o.   MRN: [id number]  HPI Here for Medicare AWV: 1. Risk factors based on Past M, S, F history:  Reviewed 2. Physical Activities: aerobics x 3 week at least  3. Depression/mood: some stress, her 65 y/o mom lives w/ them, xanax hepls w/ sleep 4. Hearing: heaing aids work well for her  5. ADL's: totally independent  6. Fall Risk: low risk, very active  7. Home Safety: does feel safe at home  8. Height, weight, &visual acuity:see Vs, uses glasse  9. Counseling: yes  10. Labs ordered based on risk factors: yes  11.           Referral Coordination, if needed  12.           Care Plan, see a/p 13.            Cognitive Assessment:  motor skills, memory and cognition normal   in addition we discussed the following HA-- better on increased neurontin, to see neuro in few days Hyperlipidemia-- on crestor, no s/e, good compliance Osteopenia-- on exercise, on Vit D, can't tol Ca solar lentigo , h/o melanoma-- sees derm q 6 months Hypothyroidism   sees  Dr Talmage Nap 6 months , good med compliance    Past Medical History: DM II Hyperlipidemia Osteopenia Hypothyroidism anxiety GI: Diverticulosis, colon GERD 4-09.Marland Kitchenabdominal pain: EGD and CTs neg.Marland KitchenRx amytriptiline  (non ulcer dyspepsia) EGD 12-11.............s/p balloon  dilitation DERM: Melanoma solar lentigo, face ; s/p excision 3-09. Sees Dermatology q 6 months  Trigeminal Neuralgia  HA, ER x 2 ~ 01-2011  Past Surgical History: Cervical laminectomy Cholecystectomy Hysterectomy ans oophorectomy  RT SHOULDER SURGERY COLON RESECTION 1999 d/t diverticulitis ; Colonoscopy 12/01/07 :Tics, Dr Arlyce Dice PTX,S/P chest tube Right Arm Surgery to remove melanoma  Family History: Colon Cancer:no Prostate Cancer--F breast ca--no Sister, endometrial Ca and Clear cell carcinoma melanoma--uncle Diabetes-- no MI-- GF in his 45s  Stroke-- GM   Social  History: Retired , Married, 2 boys Patient is a former smoker.  30 yrs ago Alcohol Use - yes/ socially Patient gets regular exercise, x3/week diet-- does watch  Illicit Drug Use - no   Review of Systems No chest pain or shortness or breath No nausea, vomiting, diarrhea or blood in the stools No dysuria gross hematuria, no vaginal bleeding or vaginal discharge.    Objective:   Physical Exam   General:  alert, well-developed, and well-nourished.   Neck:  small palpable thyroid gland (?), non tender, normal carotid pulses and no cervical lymphadenopathy.   Lungs:  normal respiratory effort, no intercostal retractions, no accessory muscle use, and normal breath sounds.   Heart:  normal rate, regular rhythm, no murmur, and no gallop.   Abdomen:  soft, non-tender, no distention, no masses, no guarding, and no rigidity.   Extremities:  no edema Psych:  Cognition and judgment appear intact. Alert and cooperative with normal attention span and concentration.  not anxious appearing and not depressed appearing.        Assessment & Plan:

## 2011-03-18 NOTE — Assessment & Plan Note (Signed)
Pt reports this issue is f/u by gyn

## 2011-03-18 NOTE — Assessment & Plan Note (Addendum)
Td 05 Pneumonia shot --  today shingles shot discussed, Rx provided last year, another provided today   Gyn is Dr.  Eustaquio Boyden [facility] DEXA per gyn labs-------------------------will forward results to DR Lisabeth Devoid last Cscope 2009, was told it was normal , next per GI   doing great, continue with healthy lifestyle

## 2011-03-19 ENCOUNTER — Encounter: Payer: Self-pay | Admitting: Internal Medicine

## 2011-03-20 DIAGNOSIS — R51 Headache: Secondary | ICD-10-CM | POA: Diagnosis not present

## 2011-03-20 DIAGNOSIS — G500 Trigeminal neuralgia: Secondary | ICD-10-CM | POA: Diagnosis not present

## 2011-03-20 DIAGNOSIS — M47812 Spondylosis without myelopathy or radiculopathy, cervical region: Secondary | ICD-10-CM | POA: Diagnosis not present

## 2011-03-23 DIAGNOSIS — M4712 Other spondylosis with myelopathy, cervical region: Secondary | ICD-10-CM | POA: Diagnosis not present

## 2011-03-23 DIAGNOSIS — M542 Cervicalgia: Secondary | ICD-10-CM | POA: Diagnosis not present

## 2011-03-26 DIAGNOSIS — M47812 Spondylosis without myelopathy or radiculopathy, cervical region: Secondary | ICD-10-CM | POA: Diagnosis not present

## 2011-03-26 DIAGNOSIS — M542 Cervicalgia: Secondary | ICD-10-CM | POA: Diagnosis not present

## 2011-03-27 DIAGNOSIS — M542 Cervicalgia: Secondary | ICD-10-CM | POA: Diagnosis not present

## 2011-03-27 DIAGNOSIS — M4712 Other spondylosis with myelopathy, cervical region: Secondary | ICD-10-CM | POA: Diagnosis not present

## 2011-04-14 ENCOUNTER — Telehealth: Payer: Self-pay | Admitting: Internal Medicine

## 2011-04-14 DIAGNOSIS — H9190 Unspecified hearing loss, unspecified ear: Secondary | ICD-10-CM

## 2011-04-14 NOTE — Telephone Encounter (Signed)
Ok to refer, i think is under "audiologist"

## 2011-04-14 NOTE — Telephone Encounter (Signed)
Done

## 2011-04-14 NOTE — Telephone Encounter (Signed)
Please advise 

## 2011-04-14 NOTE — Telephone Encounter (Signed)
Patient is requesting a referral for the hearing clinic on [address]. She has appt scheduled for 04-27-11 for cleaning of her hearing aid and would like to have a hearing test at the same time.

## 2011-04-27 DIAGNOSIS — H903 Sensorineural hearing loss, bilateral: Secondary | ICD-10-CM | POA: Diagnosis not present

## 2011-05-01 DIAGNOSIS — L821 Other seborrheic keratosis: Secondary | ICD-10-CM | POA: Diagnosis not present

## 2011-05-01 DIAGNOSIS — D239 Other benign neoplasm of skin, unspecified: Secondary | ICD-10-CM | POA: Diagnosis not present

## 2011-05-01 DIAGNOSIS — Z85820 Personal history of malignant melanoma of skin: Secondary | ICD-10-CM | POA: Diagnosis not present

## 2011-05-13 ENCOUNTER — Encounter: Payer: Self-pay | Admitting: Internal Medicine

## 2011-05-19 DIAGNOSIS — Z124 Encounter for screening for malignant neoplasm of cervix: Secondary | ICD-10-CM | POA: Diagnosis not present

## 2011-06-08 ENCOUNTER — Other Ambulatory Visit (HOSPITAL_COMMUNITY): Payer: Self-pay | Admitting: Obstetrics and Gynecology

## 2011-06-08 DIAGNOSIS — Z1231 Encounter for screening mammogram for malignant neoplasm of breast: Secondary | ICD-10-CM

## 2011-06-09 DIAGNOSIS — E039 Hypothyroidism, unspecified: Secondary | ICD-10-CM | POA: Diagnosis not present

## 2011-06-12 DIAGNOSIS — M810 Age-related osteoporosis without current pathological fracture: Secondary | ICD-10-CM | POA: Diagnosis not present

## 2011-06-12 DIAGNOSIS — G609 Hereditary and idiopathic neuropathy, unspecified: Secondary | ICD-10-CM | POA: Diagnosis not present

## 2011-06-12 DIAGNOSIS — E119 Type 2 diabetes mellitus without complications: Secondary | ICD-10-CM | POA: Diagnosis not present

## 2011-06-12 DIAGNOSIS — E039 Hypothyroidism, unspecified: Secondary | ICD-10-CM | POA: Diagnosis not present

## 2011-06-20 LAB — HM DEXA SCAN

## 2011-06-22 DIAGNOSIS — H16149 Punctate keratitis, unspecified eye: Secondary | ICD-10-CM | POA: Diagnosis not present

## 2011-06-22 DIAGNOSIS — H04129 Dry eye syndrome of unspecified lacrimal gland: Secondary | ICD-10-CM | POA: Diagnosis not present

## 2011-06-22 DIAGNOSIS — H10509 Unspecified blepharoconjunctivitis, unspecified eye: Secondary | ICD-10-CM | POA: Diagnosis not present

## 2011-07-13 DIAGNOSIS — H04129 Dry eye syndrome of unspecified lacrimal gland: Secondary | ICD-10-CM | POA: Diagnosis not present

## 2011-07-13 DIAGNOSIS — H10509 Unspecified blepharoconjunctivitis, unspecified eye: Secondary | ICD-10-CM | POA: Diagnosis not present

## 2011-07-13 DIAGNOSIS — H16149 Punctate keratitis, unspecified eye: Secondary | ICD-10-CM | POA: Diagnosis not present

## 2011-07-13 DIAGNOSIS — E119 Type 2 diabetes mellitus without complications: Secondary | ICD-10-CM | POA: Diagnosis not present

## 2011-07-16 DIAGNOSIS — R6882 Decreased libido: Secondary | ICD-10-CM | POA: Diagnosis not present

## 2011-07-17 ENCOUNTER — Ambulatory Visit (HOSPITAL_COMMUNITY)
Admission: RE | Admit: 2011-07-17 | Discharge: 2011-07-17 | Disposition: A | Discharge status: Home / Self Care | Payer: Medicare Other | Source: Ambulatory Visit | Attending: Obstetrics and Gynecology | Admitting: Obstetrics and Gynecology

## 2011-07-17 DIAGNOSIS — Z1231 Encounter for screening mammogram for malignant neoplasm of breast: Secondary | ICD-10-CM

## 2011-07-30 DIAGNOSIS — G500 Trigeminal neuralgia: Secondary | ICD-10-CM | POA: Diagnosis not present

## 2011-07-30 DIAGNOSIS — R51 Headache: Secondary | ICD-10-CM | POA: Diagnosis not present

## 2011-07-30 DIAGNOSIS — M47812 Spondylosis without myelopathy or radiculopathy, cervical region: Secondary | ICD-10-CM | POA: Diagnosis not present

## 2011-08-07 ENCOUNTER — Encounter: Payer: Self-pay | Admitting: Internal Medicine

## 2011-08-07 ENCOUNTER — Ambulatory Visit (INDEPENDENT_AMBULATORY_CARE_PROVIDER_SITE_OTHER): Payer: Medicare Other | Admitting: Internal Medicine

## 2011-08-07 VITALS — BP 122/80 | HR 72 | Temp 97.7°F | Wt 129.0 lb

## 2011-08-07 DIAGNOSIS — R109 Unspecified abdominal pain: Secondary | ICD-10-CM | POA: Diagnosis not present

## 2011-08-07 LAB — CBC WITH DIFFERENTIAL/PLATELET
Basophils Absolute: 0 10*3/uL (ref 0.0–0.1)
Basophils Relative: 1 % (ref 0–1)
Eosinophils Absolute: 0.1 10*3/uL (ref 0.0–0.7)
Eosinophils Relative: 1 % (ref 0–5)
HCT: 41 % (ref 36.0–46.0)
Hemoglobin: 13.6 g/dL (ref 12.0–15.0)
Lymphocytes Relative: 28 % (ref 12–46)
Lymphs Abs: 1.5 10*3/uL (ref 0.7–4.0)
MCH: 30.6 pg (ref 26.0–34.0)
MCHC: 33.2 g/dL (ref 30.0–36.0)
MCV: 92.1 fL (ref 78.0–100.0)
Monocytes Absolute: 0.5 10*3/uL (ref 0.1–1.0)
Monocytes Relative: 10 % (ref 3–12)
Neutro Abs: 3.2 10*3/uL (ref 1.7–7.7)
Neutrophils Relative %: 60 % (ref 43–77)
Platelets: 221 10*3/uL (ref 150–400)
RBC: 4.45 MIL/uL (ref 3.87–5.11)
RDW: 13.5 % (ref 11.5–15.5)
WBC: 5.4 10*3/uL (ref 4.0–10.5)

## 2011-08-07 NOTE — Patient Instructions (Addendum)
Get  container for stools @ the lab : C. difficile, WBCs, culture--->   DX abdominal pain Discontinue doxycycline Call if you not improving in the next few days Call anytime if you have severe symptoms, fever, chills, blood in the stools Align, a over-the-counter probiotic once daily

## 2011-08-07 NOTE — Progress Notes (Signed)
  Subjective:    Patient ID: [name] [name], female    DOB: 08-16-1945, 66 y.o.   MRN: [id number]  HPI Acute visit 2 weeks history of sharp, on and off upper abdominal pain bilaterally. Pain comes to 4 times a day, often time is postprandial but sometimes is simply at night. She has noticed some increased gargling sound from her stomach. Also for 2 or 3 weeks has noted some constipation, her bowel movements used to be daily, now they are every 2 or 3 days. The only thing she is doing different is that she started taking doxycycline as prescribed by her eye doctor for chronic conjunctivitis, she's taking 100 mg daily, she started a month ago but  already discontinue it last week.    Past Medical History:  DM II  Hyperlipidemia  Osteopenia  Hypothyroidism  anxiety  GI:  Diverticulosis, colon  GERD  4-09.Marland Kitchenabdominal pain: EGD and CTs neg.Marland KitchenRx amytriptiline (non ulcer dyspepsia)  EGD 12-11.............s/p balloon dilitation  DERM:  Melanoma  solar lentigo, face ; s/p excision 3-09. Sees Dermatology q 6 months  Trigeminal Neuralgia  HA, ER x 2 ~ 01-2011 Chronic conjunctivitis  Past Surgical History:  Cervical laminectomy  Cholecystectomy  Hysterectomy ans oophorectomy  RT SHOULDER SURGERY  COLON RESECTION 1999 d/t diverticulitis ; Colonoscopy 12/01/07 :Tics, Dr Arlyce Dice  PTX,S/P chest tube  Right Arm Surgery to remove melanoma  Family History:  Colon Cancer:no  Prostate Cancer--F  breast ca--no  Sister, endometrial Ca and Clear cell carcinoma  melanoma--uncle  Diabetes-- no  MI-- GF in his 18s  Stroke-- GM  Social History:  Retired , Married, 2 boys  Patient is a former smoker. 30 yrs ago  Alcohol Use - yes/ socially  Patient gets regular exercise, x3/week  diet-- does watch  Illicit Drug Use - no    Review of Systems Stools are softer than usual but no watery. No blood in the stools, occasionally the color has been green. No heartburn. No motrin  No nausea  vomiting. No fever chills or weight loss. Actually she has gained some weight.     Objective:   Physical Exam General -- alert, well-developed, and well-nourished.   Lungs -- normal respiratory effort, no intercostal retractions, no accessory muscle use, and normal breath sounds.   Heart-- normal rate, regular rhythm, no murmur, and no gallop.   Abdomen--soft, nondistended, mild tenderness at the epigastric area and at both lower quadrants. No mass or rebound. Bowel sounds increased. Extremities-- no pretibial edema bilaterally Psych-- Cognition and judgment appear intact. Alert and cooperative with normal attention span and concentration.  not anxious appearing and not depressed appearing.       Assessment & Plan:

## 2011-08-07 NOTE — Assessment & Plan Note (Signed)
History of abdominal pain, softer stools than usual. Abdomen is tender in the upper aspect and lower quadrants; the only thing she is doing different is taking doxycycline. C. difficile? Plan: CBC Stool studies Discontinue doxycycline at least for now Align Return to the office if not improving by next week. See instructions

## 2011-08-11 LAB — FECAL LACTOFERRIN, QUANT: Lactoferrin: NEGATIVE

## 2011-08-12 DIAGNOSIS — R6882 Decreased libido: Secondary | ICD-10-CM | POA: Diagnosis not present

## 2011-08-13 ENCOUNTER — Encounter: Payer: Self-pay | Admitting: *Deleted

## 2011-08-13 ENCOUNTER — Encounter: Payer: Self-pay | Admitting: Internal Medicine

## 2011-08-21 LAB — CLOSTRIDIUM DIFFICILE TOXIN

## 2011-08-24 ENCOUNTER — Encounter: Payer: Self-pay | Admitting: *Deleted

## 2011-09-01 ENCOUNTER — Telehealth: Payer: Self-pay | Admitting: Gastroenterology

## 2011-09-01 ENCOUNTER — Ambulatory Visit (INDEPENDENT_AMBULATORY_CARE_PROVIDER_SITE_OTHER): Payer: Medicare Other | Admitting: Internal Medicine

## 2011-09-01 ENCOUNTER — Encounter: Payer: Self-pay | Admitting: Internal Medicine

## 2011-09-01 VITALS — BP 122/70 | HR 93 | Temp 98.2°F | Wt 130.0 lb

## 2011-09-01 DIAGNOSIS — R109 Unspecified abdominal pain: Secondary | ICD-10-CM | POA: Diagnosis not present

## 2011-09-01 LAB — CBC WITH DIFFERENTIAL/PLATELET
Basophils Absolute: 0 10*3/uL (ref 0.0–0.1)
Basophils Relative: 0.3 % (ref 0.0–3.0)
Eosinophils Absolute: 0.1 10*3/uL (ref 0.0–0.7)
Eosinophils Relative: 1.1 % (ref 0.0–5.0)
HCT: 41.6 % (ref 36.0–46.0)
Hemoglobin: 13.7 g/dL (ref 12.0–15.0)
Lymphocytes Relative: 22 % (ref 12.0–46.0)
Lymphs Abs: 1.3 10*3/uL (ref 0.7–4.0)
MCHC: 32.9 g/dL (ref 30.0–36.0)
MCV: 94.6 fl (ref 78.0–100.0)
Monocytes Absolute: 0.7 10*3/uL (ref 0.1–1.0)
Monocytes Relative: 11.5 % (ref 3.0–12.0)
Neutro Abs: 3.9 10*3/uL (ref 1.4–7.7)
Neutrophils Relative %: 65.1 % (ref 43.0–77.0)
Platelets: 218 10*3/uL (ref 150.0–400.0)
RBC: 4.4 Mil/uL (ref 3.87–5.11)
RDW: 13.4 % (ref 11.5–14.6)
WBC: 5.9 10*3/uL (ref 4.5–10.5)

## 2011-09-01 LAB — H. PYLORI ANTIBODY, IGG: H Pylori IgG: NEGATIVE

## 2011-09-01 MED ORDER — ESOMEPRAZOLE MAGNESIUM 40 MG PO CPDR: DELAYED_RELEASE_CAPSULE | ORAL | Status: DC

## 2011-09-01 NOTE — Patient Instructions (Addendum)
The triggers for reflux  include stress; the "aspirin family" ; alcohol; peppermint; and caffeine (coffee, tea, cola, and chocolate). The aspirin family would include aspirin and the nonsteroidal agents such as ibuprofen &  Naproxen. Tylenol would not cause reflux. If having symptoms ; food & drink should be avoided for @ least 2 hours before going to bed.  

## 2011-09-01 NOTE — Progress Notes (Signed)
  Subjective:    Patient ID: [name] [name], female    DOB: 01/18/1946, 66 y.o.   MRN: [id number]  HPI She describes abdominal pain for at least 2 months. Initially it was in the context of taking doxycycline for an ophthalmologic infection. Despite stopping the doxycycline over a month ago the symptoms have persisted  She now describes intermittent sharp pain in the epigastrium which radiates along the left lateral abdomen and into the suprapubic area. Typically this will occur 30 minutes after eating.  She actually has some symptoms preceding the epigastric pain. This will include the sensation that her chest is closing up and that there is a " thickening"in the substernal area.  She's been taking probiotics twice a day without resolution. She does have a history of esophageal reflux. She also has a history of diverticulosis  She denies exertional chest pain but does have palpitations with exercise. She denies exertional dyspnea or claudication.  She plans to go to [place] in 6 days    Review of Systems Nausea/Vomiting: no Diarrhea: no but very loose  Constipation: no Melena/BRBPR: no  Hematemesis:no Anorexia:no Fever/Chills: no  Dysuria/hematuria/pyuria: no Wt loss: no  EtOH use: socially  NSAIDs/ASA: no Past Surgeries: colonoscopy 2009 Tics.  She had partial bowel resection for diverticulitis. She had  additional bowel resection because of adhesions possibly. She's also had a cholecystectomy   Upper endo with esophageal dilation times 2 by Dr Arlyce Dice        Objective:   Physical Exam General appearance ; thin but good health and adequately  nourished w/o distress.  Eyes: No conjunctival inflammation or scleral icterus is present. In  Oral exam: Dental hygiene is good; lips and gums are healthy appearing.There is no oropharyngeal erythema or exudate noted.   Heart:  Normal rate and regular rhythm. S1 and S2 normal without gallop, murmur, click, rub or other extra  sounds     Lungs:Chest clear to auscultation; no wheezes, rhonchi,rales ,or rubs present.No increased work of breathing.   Abdomen: bowel sounds normal, soft but tender in epigastric area, left upper quadrant, and left lower quadrant without masses, organomegaly or hernias noted.  No guarding or rebound . Well-healed midline operative scar.  Musculoskeletal: No spinal malalignment. No tenderness to percussion over the flanks  Skin:Warm & dry.  Intact without suspicious lesions or rashes ; no jaundice or tenting  Lymphatic: No lymphadenopathy is noted about the head, neck, axilla               Assessment & Plan:  #1 epigastric pain which began with doxycycline. It is likely that this drug impaired gastroesophageal valve function, but symptoms  continued off the doxycycline. The symptoms suggests significant reflux and possible esophagitis/spasm.  #2 additionally she describes left upper quadrant, left lower quadrant and suprapubic symptoms without fever or other definite signs of diverticulitis.  #3 palpitations with exercise  Plan: Reflux will be treated. See orders and recommendations

## 2011-09-01 NOTE — Telephone Encounter (Signed)
Pt scheduled to see Mike Gip PA tomorrow at 2pm. Luster Landsberg to call and notify pt of appt date and time. Pt being seen for abdominal pain and frequent loose stools.

## 2011-09-02 ENCOUNTER — Ambulatory Visit (INDEPENDENT_AMBULATORY_CARE_PROVIDER_SITE_OTHER): Payer: Commercial Managed Care - PPO | Admitting: Physician Assistant

## 2011-09-02 ENCOUNTER — Encounter: Payer: Self-pay | Admitting: Internal Medicine

## 2011-09-02 ENCOUNTER — Encounter: Payer: Self-pay | Admitting: Physician Assistant

## 2011-09-02 VITALS — BP 114/62 | HR 88 | Ht 64.0 in | Wt 129.1 lb

## 2011-09-02 DIAGNOSIS — R109 Unspecified abdominal pain: Secondary | ICD-10-CM

## 2011-09-02 LAB — LIPASE: Lipase: 33 U/L (ref 11.0–59.0)

## 2011-09-02 LAB — AMYLASE: Amylase: 53 U/L (ref 27–131)

## 2011-09-02 LAB — AST: AST: 21 U/L (ref 0–37)

## 2011-09-02 LAB — ALT: ALT: 20 U/L (ref 0–35)

## 2011-09-02 MED ORDER — DICYCLOMINE HCL 10 MG PO CAPS: 10.0000 mg | ORAL_CAPSULE | Freq: Three times a day (TID) | ORAL | Status: DC

## 2011-09-02 MED ORDER — ESOMEPRAZOLE MAGNESIUM 40 MG PO CPDR: DELAYED_RELEASE_CAPSULE | ORAL | Status: DC

## 2011-09-02 MED ORDER — ALIGN PO CAPS: 1.0000 | ORAL_CAPSULE | Freq: Every day | ORAL | Status: DC

## 2011-09-02 NOTE — Progress Notes (Signed)
Subjective:    Patient ID: [name] [name], female    DOB: [birth year], 66 y.o.   MRN: [id number]  HPI [name] [name] is a pleasant 66 year old female known to Dr. Arlyce Dice who has history of GERD and prior esophageal stricture requiring dilation in 2011. She also has diverticulosis, and last had colonoscopy in 2009. He had a remote colon resection for diverticulitis in 1999, is also status post small bowel obstruction secondary to adhesions and cholecystectomy.  Patient says her current symptoms started towards the end of June 2013 after she had taken 3-4 week course of doxycycline for a conjunctivitis. She says she began having upper of nominal discomfort increased reflux symptoms and heartburn as well as loose stools. She had  been on probiotics prior to that time. She tried await out her symptoms, went to see Dr. Drue Novel in mid July who increased her probiotics to twice daily and did stool studies Stool for C. difficile toxin was negative stool culture negative ,stool for lactoferrin was negative. After increasing the probiotic she did not notice any improvement in her symptoms and that she felt like she was feeling worse. She was seen by Dr. hopper yesterday he started her on Nexium. She stopped the doxycycline about 3 weeks ago. At this time she says the pain is intermittent located in her upper abdomen and into the left upper quadrant and has been associated with a lot of rumbling and gas noises. She also has had ongoing problems with reflux particularly with bending over. Over the past couple of weeks he has had urgent bowel movements with intermittent incontinence and at that time usually has somewhat mushy stringy stools without blood. She says one day she may have 2 or 3 bowel movements and then not go at all for a couple of days and then Again with urgency and mushy stool. She is concerned because she supposed to be leaving for [place] early next week. Her labs are reviewed from yesterday with CBC,CMET and  lipase all normal.   Review of Systems  Constitutional: Negative.   HENT: Negative.   Eyes: Negative.   Respiratory: Negative.   Cardiovascular: Negative.   Gastrointestinal: Positive for abdominal pain and diarrhea.  Genitourinary: Negative.   Musculoskeletal: Negative.   Neurological: Negative.   Hematological: Negative.   Psychiatric/Behavioral: Negative.    Outpatient Prescriptions Prior to Visit  Medication Sig Dispense Refill  . ALPRAZolam (XANAX) 0.5 MG tablet Take 1 tablet (0.5 mg total) by mouth at bedtime as needed. For sleep  90 tablet  1  . amitriptyline (ELAVIL) 25 MG tablet Take 12.5 mg by mouth at bedtime.       . Cholecalciferol (VITAMIN D3) 2000 UNITS TABS Take 1 tablet by mouth daily.       . cycloSPORINE (RESTASIS) 0.05 % ophthalmic emulsion Place 1 drop into both eyes 2 (two) times daily.       [name] gabapentin (NEURONTIN) 600 MG tablet Take 600 mg by mouth 2 (two) times daily.      [name] levothyroxine (SYNTHROID) 25 MCG tablet Take 25 mcg by mouth daily.        . Multiple Vitamin (MULTIVITAMIN) tablet Take 1 tablet by mouth daily.        . Probiotic Product (PROBIOTIC DAILY PO) Take by mouth.      . rosuvastatin (CRESTOR) 10 MG tablet Take 1 tablet (10 mg total) by mouth daily.  90 tablet  3  . esomeprazole (NEXIUM) 40 MG capsule 1 bid pre meals  30  capsule  0   Allergies  Allergen Reactions  . Ceftriaxone Sodium Anaphylaxis  . Benadryl (Diphenhydramine Hcl) Anxiety    Hyperactivity   Patient Active Problem List  Diagnosis  . MELANOMA  . HYPOTHYROIDISM  . DIABETES MELLITUS, TYPE II  . HYPERLIPIDEMIA  . ANXIETY  . NEURALGIA, TRIGEMINAL  . ESOPHAGEAL STRICTURE  . GERD  . DIVERTICULOSIS, COLON  . OSTEOPENIA  . DYSPHAGIA UNSPECIFIED  . Abdominal pain  . Headache  . General medical examination   History   Social History  . Marital Status: Married    Spouse Name: N/A    Number of Children: 2  . Years of Education: N/A   Occupational History  .  retired    Social History Main Topics  . Smoking status: Former Games developer  . Smokeless tobacco: Never Used  . Alcohol Use: Yes     socially  . Drug Use: No  . Sexually Active: Not on file   Other Topics Concern  . Not on file   Social History Narrative   Diet- does watchPatient gets regular exercise, 3x/weekDiabetic Foot Check: 08/25/2008       Objective:   Physical Exam well-developed older white female in no acute distress, pleasant blood pressure 114/62 pulse 88 height 5 foot 4 weight 129. HEENT; nontraumatic normocephalic EOMI PERRLA sclera anicteric,Neck; Supple no JVD, Cardiovascular; regular rate and rhythm with S1-S2 no murmur or gallop, Pulmonary; clear bilaterally, Abdomen ;soft mildly tender in the epigastrium there is no guarding no rebound no palpable mass or hepatosplenomegaly bowel sounds are present, Rectal; exam not done, Extremities; no clubbing cyanosis or edema skin warm and dry, Psych; mood and affect normal and appropriate.        Assessment & Plan:  #28 67 year old female with onset of epigastric discomfort change in bowel habits with urgency and loose stools as well as increased heartburn and indigestion after a recent long course of Doxycycline. Is suspect her ongoing GI symptoms are related to side effects of the doxycycline and has now been discontinued. I suspect she may have gastritis and an exacerbation of IBS due to altered bowel flora. Stool for C. difficile was negative within the past couple weeks  #2 history of diverticular disease status post resection for diverticulitis 1999 #3 status post cholecystectomy #4 history of small bowel obstruction secondary to adhesions #5 strip esophageal stricture with dilation 2011  Plan; continue Nexium 40 mg by mouth twice daily over the next 2 weeks and then decrease to once daily Patient had been taking Culturelle which requires refrigeratoration and, will switch to Align one by mouth daily over the next month  which will make things easier for her upcoming travel Add Bentyl 10 mg every morning and then up to 3 times daily as needed for cramping and urgency. She will followup with Dr. Arlyce Dice in about 3 weeks

## 2011-09-02 NOTE — Patient Instructions (Addendum)
No allergy to soy or eggs.  We have sent the following medications to your pharmacy for you to pick up at your convenience: Bentyl, Take three times daily as needed for cramping spasms.  nexium (Samples given)  and Align(samples given) Please take as directed.   Please Follow up with Dr. Arlyce Dice in 3-4 Weeks

## 2011-09-03 NOTE — Progress Notes (Signed)
i agree with the plan outlined in this note 

## 2011-09-04 DIAGNOSIS — R6882 Decreased libido: Secondary | ICD-10-CM | POA: Diagnosis not present

## 2011-10-02 ENCOUNTER — Other Ambulatory Visit: Payer: Self-pay | Admitting: Internal Medicine

## 2011-10-02 NOTE — Telephone Encounter (Signed)
Done

## 2011-10-02 NOTE — Telephone Encounter (Signed)
Ok to refill 

## 2011-10-12 ENCOUNTER — Encounter: Payer: Self-pay | Admitting: Gastroenterology

## 2011-10-12 ENCOUNTER — Ambulatory Visit (INDEPENDENT_AMBULATORY_CARE_PROVIDER_SITE_OTHER): Payer: Commercial Managed Care - PPO | Admitting: Gastroenterology

## 2011-10-12 VITALS — BP 110/66 | HR 84 | Ht 63.75 in | Wt 131.2 lb

## 2011-10-12 DIAGNOSIS — H04129 Dry eye syndrome of unspecified lacrimal gland: Secondary | ICD-10-CM | POA: Diagnosis not present

## 2011-10-12 DIAGNOSIS — R197 Diarrhea, unspecified: Secondary | ICD-10-CM

## 2011-10-12 DIAGNOSIS — H01009 Unspecified blepharitis unspecified eye, unspecified eyelid: Secondary | ICD-10-CM | POA: Diagnosis not present

## 2011-10-12 DIAGNOSIS — H2510 Age-related nuclear cataract, unspecified eye: Secondary | ICD-10-CM | POA: Diagnosis not present

## 2011-10-12 DIAGNOSIS — H10509 Unspecified blepharoconjunctivitis, unspecified eye: Secondary | ICD-10-CM | POA: Diagnosis not present

## 2011-10-12 NOTE — Progress Notes (Signed)
History of Present Illness:  [name] [name] continues to complain of intermittent diarrhea with urgency. This has not occurred daily but she feels that symptoms have been gradually worsening over the past 3 weeks. This is despite using a align daily and Nexium.  Altogether she has been off tetracycline for over 2 months.    Review of Systems: Pertinent positive and negative review of systems were noted in the above HPI section. All other review of systems were otherwise negative.    Current Medications, Allergies, Past Medical History, Past Surgical History, Family History and Social History were reviewed in [employer] [facility] electronic medical record  Vital signs were reviewed in today's medical record. Physical Exam: General: Well developed , well nourished, no acute distress

## 2011-10-12 NOTE — Patient Instructions (Signed)
You have been scheduled for a Flex Sig for October 21, 2011 at 8 am. Please follow instructions given

## 2011-10-12 NOTE — Assessment & Plan Note (Signed)
Diarrhea began during the second month of a course of tetracycline but has persisted for over 2 months after discontinuing this. Initial C. difficile toxin was negative. Infectious colitis and microscopic colitis are considerations.  Last full colonoscopy in 2011 demonstrated diverticulosis and hemorrhoids.  Recommendations #1 discontinue Nexium and a lying #2 sigmoidoscopy

## 2011-10-13 ENCOUNTER — Encounter: Payer: Self-pay | Admitting: Internal Medicine

## 2011-10-13 ENCOUNTER — Ambulatory Visit (INDEPENDENT_AMBULATORY_CARE_PROVIDER_SITE_OTHER): Payer: Commercial Managed Care - PPO | Admitting: Internal Medicine

## 2011-10-13 VITALS — BP 124/80 | HR 90 | Temp 98.4°F | Wt 131.0 lb

## 2011-10-13 DIAGNOSIS — J069 Acute upper respiratory infection, unspecified: Secondary | ICD-10-CM | POA: Diagnosis not present

## 2011-10-13 NOTE — Patient Instructions (Addendum)
Rest, fluids , tylenol If  Cough or congestion, take Mucinex DM twice a day as needed  Call if no better in few days Call anytime if the symptoms are severe, you have high fever, more pain

## 2011-10-13 NOTE — Progress Notes (Signed)
  Subjective:    Patient ID: [name] [name], female    DOB: October 14, 1945, 66 y.o.   MRN: [id number]  HPI Acute visit 2 days ago and last night experienced left ear ache which was intense at some point, decreased with Tylenol. Along with the pain she felt some discomfort at the left side of the neck. Pain increase when she swallows, no change w/ chewing . Denies fever or chills No cough No sinus pain, congestion, no nasal discharge. Hoarseness today.   Past Medical History:   DM II   Hyperlipidemia   Osteopenia   Hypothyroidism   anxiety   GI:   Diverticulosis, colon   GERD   4-09.Marland Kitchenabdominal pain: EGD and CTs neg.Marland KitchenRx amytriptiline (non ulcer dyspepsia)   EGD 12-11.............s/p balloon dilitation   DERM:   Melanoma   solar lentigo, face ; s/p excision 3-09. Sees Dermatology q 6 months   Trigeminal Neuralgia   HA, ER x 2 ~ 01-2011 Chronic conjunctivitis  Past Surgical History:   Cervical laminectomy   Cholecystectomy   Hysterectomy ans oophorectomy   RT SHOULDER SURGERY   COLON RESECTION 1999 d/t diverticulitis ; Colonoscopy 12/01/07 :Tics, Dr Arlyce Dice   PTX,S/P chest tube   Right Arm Surgery to remove melanoma   Family History:   Colon Cancer:no   Prostate Cancer--F   breast ca--no   Sister, endometrial Ca and Clear cell carcinoma   melanoma--uncle   Diabetes-- no   MI-- GF in his 40s   Stroke-- GM   Social History:   Retired , Married, 2 boys   Patient is a former smoker. 30 yrs ago   Alcohol Use - yes/ socially   Patient gets regular exercise, x3/week   Illicit Drug Use - no     Review of Systems See HPI    Objective:   Physical Exam  Neck:     General -- alert, well-developed, and well-nourished.   HEENT -- TMs normal, throat symmetric and w/o redness ; face symmetric and not tender to palpation, TMJ without click or tenderness. Percussion of the mastoids negative. Percussion of the teeth negative as well. Lungs -- normal respiratory effort, no  intercostal retractions, no accessory muscle use, and normal breath sounds.   Heart-- normal rate, regular rhythm, no murmur, and no gallop.   Psych-- Cognition and judgment appear intact. Alert and cooperative with normal attention span and concentration.  not anxious appearing and not depressed appearing.        Assessment & Plan:  URI, Complains of left otalgia with normal exam, tender left LAD (or salivary gland) and some hoarseness. Symptoms likely from a URI, will try conservative treatment. She is having some GI issues, will avoid antibiotics if possible.

## 2011-10-20 ENCOUNTER — Other Ambulatory Visit: Payer: Self-pay | Admitting: Internal Medicine

## 2011-10-20 DIAGNOSIS — E119 Type 2 diabetes mellitus without complications: Secondary | ICD-10-CM | POA: Diagnosis not present

## 2011-10-20 DIAGNOSIS — E039 Hypothyroidism, unspecified: Secondary | ICD-10-CM | POA: Diagnosis not present

## 2011-10-21 ENCOUNTER — Encounter: Payer: Self-pay | Admitting: Gastroenterology

## 2011-10-21 ENCOUNTER — Ambulatory Visit (AMBULATORY_SURGERY_CENTER): Payer: Commercial Managed Care - PPO | Admitting: Gastroenterology

## 2011-10-21 VITALS — BP 131/73 | HR 97 | Temp 97.6°F | Resp 22 | Ht 63.0 in | Wt 131.0 lb

## 2011-10-21 DIAGNOSIS — R197 Diarrhea, unspecified: Secondary | ICD-10-CM | POA: Diagnosis not present

## 2011-10-21 DIAGNOSIS — Z8719 Personal history of other diseases of the digestive system: Secondary | ICD-10-CM | POA: Diagnosis not present

## 2011-10-21 LAB — GLUCOSE, CAPILLARY
Glucose-Capillary: 177 mg/dL — ABNORMAL HIGH (ref 70–99)
Glucose-Capillary: 193 mg/dL — ABNORMAL HIGH (ref 70–99)
Glucose-Capillary: 68 mg/dL — ABNORMAL LOW (ref 70–99)
Glucose-Capillary: 136 mg/dL — ABNORMAL HIGH (ref 70–99)

## 2011-10-21 MED ORDER — DEXTROSE 5 % IV SOLN: INTRAVENOUS | Status: DC

## 2011-10-21 MED ORDER — NEXIUM 40 MG PO CPDR: 40.0000 mg | DELAYED_RELEASE_CAPSULE | Freq: Every day | ORAL | Status: DC

## 2011-10-21 NOTE — Patient Instructions (Signed)
Normal exam today. Try to follow high fiber diet. See handout. Call our office to make appointment for follow up in 3-4 weeks. Nexium  40 mg take 1 pill by mouth daily. May pick this up from your pharmacy. Call us with any questions or concerns. Thank you!!  YOU HAD AN ENDOSCOPIC PROCEDURE TODAY AT THE [facility]: Refer to the procedure report that was given to you for any specific questions about what was found during the examination.  If the procedure report does not answer your questions, please call your gastroenterologist to clarify.  If you requested that your care partner not be given the details of your procedure findings, then the procedure report has been included in a sealed envelope for you to review at your convenience later.  YOU SHOULD EXPECT: Some feelings of bloating in the abdomen. Passage of more gas than usual.  Walking can help get rid of the air that was put into your GI tract during the procedure and reduce the bloating. If you had a lower endoscopy (such as a colonoscopy or flexible sigmoidoscopy) you may notice spotting of blood in your stool or on the toilet paper. If you underwent a bowel prep for your procedure, then you may not have a normal bowel movement for a few days.  DIET: Your first meal following the procedure should be a light meal and then it is ok to progress to your normal diet.  A half-sandwich or bowl of soup is an example of a good first meal.  Heavy or fried foods are harder to digest and may make you feel nauseous or bloated.  Likewise meals heavy in dairy and vegetables can cause extra gas to form and this can also increase the bloating.  Drink plenty of fluids but you should avoid alcoholic beverages for 24 hours.  ACTIVITY: Your care partner should take you home directly after the procedure.  You should plan to take it easy, moving slowly for the rest of the day.  You can resume normal activity the day after the procedure however you should NOT  DRIVE or use heavy machinery for 24 hours (because of the sedation medicines used during the test).    SYMPTOMS TO REPORT IMMEDIATELY: A gastroenterologist can be reached at any hour.  During normal business hours, 8:30 AM to 5:00 PM Monday through Friday, call [phone].  After hours and on weekends, please call the GI answering service at [phone] who will take a message and have the physician on call contact you.   Following lower endoscopy (colonoscopy or flexible sigmoidoscopy):  Excessive amounts of blood in the stool  Significant tenderness or worsening of abdominal pains  Swelling of the abdomen that is new, acute  Fever of 100F or higher  FOLLOW UP: If any biopsies were taken you will be contacted by phone or by letter within the next 1-3 weeks.  Call your gastroenterologist if you have not heard about the biopsies in 3 weeks.  Our staff will call the home number listed on your records the next business day following your procedure to check on you and address any questions or concerns that you may have at that time regarding the information given to you following your procedure. This is a courtesy call and so if there is no answer at the home number and we have not heard from you through the emergency physician on call, we will assume that you have returned to your regular daily activities without incident.  SIGNATURES/CONFIDENTIALITY: You and/or your care partner have signed paperwork which will be entered into your electronic medical record.  These signatures attest to the fact that that the information above on your After Visit Summary has been reviewed and is understood.  Full responsibility of the confidentiality of this discharge information lies with you and/or your care-partner.

## 2011-10-21 NOTE — Progress Notes (Signed)
Patient did not experience any of the following events: a burn prior to discharge; a fall within the facility; wrong site/side/patient/procedure/implant event; or a hospital transfer or hospital admission upon discharge from the facility. (G8907) Patient did not have preoperative order for IV antibiotic SSI prophylaxis. (G8918)  

## 2011-10-21 NOTE — Progress Notes (Signed)
Rechecked sugar at 755- 193 and 177.  IV slowed down to [facility] and pt taken to procedure room

## 2011-10-21 NOTE — Telephone Encounter (Signed)
Refill done.  

## 2011-10-21 NOTE — Op Note (Signed)
[facility] [address] [place], [address]   FLEXIBLE SIGMOIDOSCOPY PROCEDURE REPORT  PATIENT: [name] [name], [name] [name]  MR#: [id number] BIRTHDATE: 22-Jul-1945 , 65  yrs. old GENDER: Female ENDOSCOPIST: Louis Meckel, MD REFERRED BY: PROCEDURE DATE:  10/21/2011 PROCEDURE: ASA CLASS:   Class II INDICATIONS: MEDICATIONS: MAC sedation, administered by CRNA and propofol (Diprivan) 100mg  IV  DESCRIPTION OF PROCEDURE:   After the risks benefits and alternatives of the procedure were thoroughly explained, informed consent was obtained.  revealed no abnormalities of the rectum. The [id number]  endoscope was introduced through the anus and advanced to the descending colon , limited by No adverse events experienced.   The quality of the prep was    .  The instrument was then slowly withdrawn as the mucosa was fully examined.     1.  COLON FINDINGS: There was evidence of a prior surgical anastomosis at 20 cm from the anus 2.  The colonic mucosa appeared normal. Retroflexed views revealed no abnormalities.    The scope was then withdrawn from the patient and the procedure terminated.  COMPLICATIONS: There were no complications.  ENDOSCOPIC IMPRESSION: 1.   There was evidence of a prior surgical anastomosis 2.   The colonic mucosa appeared normal  RECOMMENDATIONS: high-fiber diet office visit 3-4 weeks   REPEAT EXAM:   _______________________________ eSignedLouis Meckel, MD 10/21/2011 8:31 AM   XB:JYNW Drue Novel, MD Bishop Limbo MD

## 2011-10-22 ENCOUNTER — Telehealth: Payer: Self-pay

## 2011-10-22 NOTE — Telephone Encounter (Signed)
  Follow up Call-  Call back number 10/21/2011  Post procedure Call Back phone  # [phone]  Permission to leave phone message Yes     Patient questions:  Do you have a fever, pain , or abdominal swelling? no Pain Score  0 *  Have you tolerated food without any problems? yes  Have you been able to return to your normal activities? yes  Do you have any questions about your discharge instructions: Diet   no Medications  no Follow up visit  no  Do you have questions or concerns about your Care? no  Actions: * If pain score is 4 or above: No action needed, pain <4.  Per the pt, "I did good yesterday, my tummy is a little tender, but otherwise okay". Maw

## 2011-10-26 ENCOUNTER — Other Ambulatory Visit: Payer: Self-pay | Admitting: Endocrinology

## 2011-10-26 DIAGNOSIS — E039 Hypothyroidism, unspecified: Secondary | ICD-10-CM | POA: Diagnosis not present

## 2011-10-26 DIAGNOSIS — E119 Type 2 diabetes mellitus without complications: Secondary | ICD-10-CM | POA: Diagnosis not present

## 2011-10-26 DIAGNOSIS — E049 Nontoxic goiter, unspecified: Secondary | ICD-10-CM | POA: Diagnosis not present

## 2011-10-26 DIAGNOSIS — G609 Hereditary and idiopathic neuropathy, unspecified: Secondary | ICD-10-CM | POA: Diagnosis not present

## 2011-10-26 DIAGNOSIS — Z23 Encounter for immunization: Secondary | ICD-10-CM | POA: Diagnosis not present

## 2011-10-27 DIAGNOSIS — Z113 Encounter for screening for infections with a predominantly sexual mode of transmission: Secondary | ICD-10-CM | POA: Diagnosis not present

## 2011-10-27 DIAGNOSIS — N766 Ulceration of vulva: Secondary | ICD-10-CM | POA: Diagnosis not present

## 2011-10-28 ENCOUNTER — Ambulatory Visit
Admission: RE | Admit: 2011-10-28 | Discharge: 2011-10-28 | Disposition: A | Discharge status: Home / Self Care | Payer: Medicare Other | Source: Ambulatory Visit | Attending: Endocrinology | Admitting: Endocrinology

## 2011-10-28 DIAGNOSIS — E049 Nontoxic goiter, unspecified: Secondary | ICD-10-CM

## 2011-10-28 DIAGNOSIS — E041 Nontoxic single thyroid nodule: Secondary | ICD-10-CM | POA: Diagnosis not present

## 2011-11-03 ENCOUNTER — Encounter: Payer: Self-pay | Admitting: Internal Medicine

## 2011-11-03 ENCOUNTER — Other Ambulatory Visit: Payer: Medicare Other | Admitting: Gastroenterology

## 2011-11-11 ENCOUNTER — Encounter: Payer: Self-pay | Admitting: Gastroenterology

## 2011-11-11 ENCOUNTER — Ambulatory Visit (INDEPENDENT_AMBULATORY_CARE_PROVIDER_SITE_OTHER): Payer: Commercial Managed Care - PPO | Admitting: Gastroenterology

## 2011-11-11 VITALS — BP 104/62 | HR 90 | Ht 64.25 in | Wt 130.0 lb

## 2011-11-11 DIAGNOSIS — K219 Gastro-esophageal reflux disease without esophagitis: Secondary | ICD-10-CM

## 2011-11-11 DIAGNOSIS — R197 Diarrhea, unspecified: Secondary | ICD-10-CM

## 2011-11-11 DIAGNOSIS — R109 Unspecified abdominal pain: Secondary | ICD-10-CM | POA: Diagnosis not present

## 2011-11-11 MED ORDER — HYOSCYAMINE SULFATE ER 0.375 MG PO TBCR: EXTENDED_RELEASE_TABLET | ORAL | Status: DC

## 2011-11-11 NOTE — Assessment & Plan Note (Signed)
Diarrhea began during the second month of a course of tetracycline but has persisted for over 2 months after discontinuing this. It is now resolving. Recent sigmoidoscopy negative.

## 2011-11-11 NOTE — Patient Instructions (Addendum)
Follow up as needed

## 2011-11-11 NOTE — Assessment & Plan Note (Signed)
Improving, nonspecific and generally mild. Plan to add hyomax in lieu of Bentyl, when necessary

## 2011-11-11 NOTE — Progress Notes (Signed)
History of Present Illness: [name] [name] has returned for followup of diarrhea and abdominal pain. Diarrhea has subsided. Stools are generally returning to normal. Sigmoidoscopy was unremarkable. She has occasional left periumbilical discomfort for which she takes Bentyl with some relief. She complains of regurgitation of gastric contents, especially if she bends over. She denies pyrosis, per se. She is taking Nexium.    Past Medical History  Diagnosis Date  . Hyperlipidemia   . Osteopenia   . Diverticulosis of colon   . GERD (gastroesophageal reflux disease)   . Abdominal pain     EGD and CTs neg, rx Amitriptiline (non ulcer dyspepsia) EGD 12/11.. s/p ballon dilitation  . Anxiety   . Solar lentigo     face; s/p excision 3/09. See Derm q 6 months  . Hypothyroidism      Dx 10-9 TSH 5.2 , then TSH normalized, eventually went up again 2012  . Trigeminal neuralgia     h/o   . Melanoma     h/o  . Diabetes mellitus     no meds, diet controlled   Past Surgical History  Procedure Date  . Cervical fusion   . Cholecystectomy   . Abdominal hysterectomy   . Shoulder surgery     right  . Colon resection 1999    d/t diverticulitis  . Wrist surgery     removed melaoma, right  . Colon surgery    family history includes Endometrial cancer in her sister; Heart attack in her paternal grandfather; Melanoma in her maternal uncle; and Prostate cancer in her father.  There is no history of Colon cancer, and Breast cancer, and Diabetes, and Stomach cancer, and Rectal cancer, . Current Outpatient Prescriptions  Medication Sig Dispense Refill  . ALPRAZolam (XANAX) 0.5 MG tablet TAKE 1 TABLET (0.5MG  TOTAL) BY MOUTH AT BEDTIME AS NEEDED FOR SLEEP  90 tablet  3  . amitriptyline (ELAVIL) 25 MG tablet TAKE 1 TABLET BY MOUTH EVERY NIGHT AT BEDTIME  90 tablet  0  . Cholecalciferol (VITAMIN D3) 2000 UNITS TABS Take 1 tablet by mouth daily.       . cycloSPORINE (RESTASIS) 0.05 % ophthalmic emulsion Place 1  drop into both eyes 2 (two) times daily.       [name] dicyclomine (BENTYL) 10 MG capsule Take 10 mg by mouth as needed.      . gabapentin (NEURONTIN) 600 MG tablet Take 300 mg by mouth daily.       [name] levothyroxine (SYNTHROID) 25 MCG tablet Take 25 mcg by mouth daily.        . Multiple Vitamin (MULTIVITAMIN) tablet Take 1 tablet by mouth daily.        [name] NEXIUM 40 MG capsule Take 1 capsule (40 mg total) by mouth daily before breakfast.  30 capsule  2  . rosuvastatin (CRESTOR) 10 MG tablet Take 1 tablet (10 mg total) by mouth daily.  90 tablet  3  . DISCONTD: esomeprazole (NEXIUM) 40 MG capsule 1 bid pre meals  30 capsule  0   Allergies as of 11/11/2011 - Review Complete 11/11/2011  Allergen Reaction Noted  . Ceftriaxone sodium Anaphylaxis 02/17/2006  . Benadryl (diphenhydramine hcl) Anxiety 01/29/2011    reports that she has quit smoking. She has never used smokeless tobacco. She reports that she drinks alcohol. She reports that she does not use illicit drugs.     Review of Systems: Pertinent positive and negative review of systems were noted in the above HPI section. All other  review of systems were otherwise negative.  Vital signs were reviewed in today's medical record Physical Exam: General: Well developed , well nourished, no acute distress On abdominal exam there are no masses, tenderness organomegaly

## 2011-11-11 NOTE — Assessment & Plan Note (Addendum)
She is minimally symptomatic with some  reflux. Plan to continue Nexium as needed

## 2011-11-23 DIAGNOSIS — M654 Radial styloid tenosynovitis [de Quervain]: Secondary | ICD-10-CM | POA: Diagnosis not present

## 2011-11-24 DIAGNOSIS — D239 Other benign neoplasm of skin, unspecified: Secondary | ICD-10-CM | POA: Diagnosis not present

## 2011-11-24 DIAGNOSIS — L821 Other seborrheic keratosis: Secondary | ICD-10-CM | POA: Diagnosis not present

## 2011-11-24 DIAGNOSIS — D1801 Hemangioma of skin and subcutaneous tissue: Secondary | ICD-10-CM | POA: Diagnosis not present

## 2011-11-24 DIAGNOSIS — L738 Other specified follicular disorders: Secondary | ICD-10-CM | POA: Diagnosis not present

## 2011-11-30 DIAGNOSIS — M47812 Spondylosis without myelopathy or radiculopathy, cervical region: Secondary | ICD-10-CM | POA: Diagnosis not present

## 2011-11-30 DIAGNOSIS — G500 Trigeminal neuralgia: Secondary | ICD-10-CM | POA: Diagnosis not present

## 2011-12-01 ENCOUNTER — Ambulatory Visit (INDEPENDENT_AMBULATORY_CARE_PROVIDER_SITE_OTHER): Payer: Commercial Managed Care - PPO | Admitting: Internal Medicine

## 2011-12-01 VITALS — BP 118/74 | HR 85 | Temp 98.0°F | Wt 130.0 lb

## 2011-12-01 DIAGNOSIS — R109 Unspecified abdominal pain: Secondary | ICD-10-CM | POA: Diagnosis not present

## 2011-12-01 DIAGNOSIS — K219 Gastro-esophageal reflux disease without esophagitis: Secondary | ICD-10-CM

## 2011-12-01 MED ORDER — RANITIDINE HCL 300 MG PO TABS: 300.0000 mg | ORAL_TABLET | Freq: Every day | ORAL | Status: DC

## 2011-12-01 MED ORDER — BELLADONNA ALK-PHENOBARBITAL 48 MG PO TBCR: 1.0000 | EXTENDED_RELEASE_TABLET | Freq: Two times a day (BID) | ORAL | Status: DC | PRN

## 2011-12-01 NOTE — Progress Notes (Signed)
  Subjective:    Patient ID: [name] [name], female    DOB: 12/27/1945, 66 y.o.   MRN: [id number]  HPI Continue GI symptoms since approximately 07/2011 Chart reviewed, has seen GI 3 times, had a sigmoidoscopy 10/21/2011 which  was essentially negative. At the present time, she continue to have frequent belching, no classic heartburn. She also has a ill-defined upper abdominal discomfort,pain start at the  epigastric area then moves to the  left side. Symptoms do not change with change in diet. She was prescribed hiosciamine, it helped but caused dry mouth. She took Nexium as well, for the second time she noted associated diarrhea after Nexium. She self discontinue both medicines.  She is here today because she likes to be sure nothing serious is going on.   Past Medical History:   DM II   Hyperlipidemia   Osteopenia   Hypothyroidism   anxiety   GI:   Diverticulosis, colon   GERD   4-09---abdominal pain: EGD and CTs neg.Marland KitchenRx amytriptiline (non ulcer dyspepsia)   EGD 12-11.............s/p balloon dilitation   DERM:   Melanoma   solar lentigo, face ; s/p excision 3-09. Sees Dermatology q 6 months   Trigeminal Neuralgia   HA, ER x 2 ~ 01-2011 Chronic conjunctivitis  Past Surgical History:   Cervical laminectomy   Cholecystectomy   Hysterectomy ans oophorectomy   RT SHOULDER SURGERY   COLON RESECTION 1999 d/t diverticulitis ; Colonoscopy 12/01/07 :Tics, Dr Arlyce Dice   PTX,S/P chest tube   Right Arm Surgery to remove melanoma   Family History:   Colon Cancer:no   Prostate Cancer--F   breast ca--no   Sister, endometrial Ca and Clear cell carcinoma   melanoma--uncle   Diabetes-- no   MI-- GF in his 38s   Stroke-- GM   Social History:   Retired , Married, 2 boys   Patient is a former smoker. 30 yrs ago   Alcohol Use - yes/ socially   Patient gets regular exercise, x3/week   Illicit Drug Use - no     Review of Systems Denies actual nausea, vomiting, diarrhea. No blood in  the stools. No odynophagia, occasional dysphagia. No fever or chills No weight loss.    Objective:   Physical Exam  General -- alert, well-developed, and healthy appearing.   HEENT -- not pale or jaundice   Lungs -- normal respiratory effort, no intercostal retractions, no accessory muscle use, and normal breath sounds.   Heart-- normal rate, regular rhythm, no murmur, and no gallop.   Abdomen--soft, non-tender, no distention, no masses, no HSM, no guarding, and no rigidity.   Extremities-- no pretibial edema bilaterally  Neurologic-- alert & oriented X3 and strength normal in all extremities. Psych-- Cognition and judgment appear intact. Alert and cooperative with normal attention span , mildly anxious, not depressed appearing      Assessment & Plan:  Today , I spent more than 33  min with the patient, >50% of the time counseling, a  reviewing the chart and reassuring the patient

## 2011-12-01 NOTE — Assessment & Plan Note (Addendum)
Having stomach problems for the last 4 months, currently continue with the atypical abdominal pain, not typical for PUD or gastroparesis. Patient is concerned she has something serious, chart reviewed---> CT abdomen last year (-), recent CBC, sigmoidoscopy, H. pylorus serology negative. Stool test was negative for C. difficile and WBCs. Self discontinue Nexium due to diarrhea. hyosciamine helps but caused dry mouth. Based on this information I don't think something serious is going on, although I cannot be 100% sure. I recommended to try Zantac 300 mg and Donnatal as needed. If not improving in the next few weeks we may need to contact GI for reconsideration of  a EGD. She may also need a gastric emptying study but again his symptoms are not typical for gastroparesis. Patient in agreement

## 2011-12-01 NOTE — Patient Instructions (Addendum)
Call in 4-6 weeks if you're not improving.

## 2011-12-01 NOTE — Assessment & Plan Note (Signed)
See "abdominal pain"

## 2011-12-02 ENCOUNTER — Encounter: Payer: Self-pay | Admitting: Internal Medicine

## 2011-12-07 DIAGNOSIS — M654 Radial styloid tenosynovitis [de Quervain]: Secondary | ICD-10-CM | POA: Diagnosis not present

## 2011-12-14 ENCOUNTER — Encounter: Payer: Self-pay | Admitting: Internal Medicine

## 2011-12-15 ENCOUNTER — Encounter: Payer: Self-pay | Admitting: Internal Medicine

## 2011-12-15 ENCOUNTER — Telehealth: Payer: Self-pay

## 2011-12-15 NOTE — Telephone Encounter (Signed)
Message copied by Chrystie Nose on Tue Dec 15, 2011  4:58 PM ------      Message from: Melvia Heaps D      Created: Tue Dec 15, 2011  8:38 AM      Regarding: RE: further w/u ?       The last time I saw her she was feeling better. I will ask her to come to the office to reevaluate her.      Thanks,      Rob      ----- Message -----         From: Wanda Plump, MD         Sent: 12/14/2011   5:11 PM           To: Wanda Plump, MD, Louis Meckel, MD      Subject: further w/u ?                                            Rob, I saw our mutual patient few weeks ago with stomach problems, she is still symptomatic (see the OV note); any suggestions? EGD? Gastric empty study?       Please let me know, thanks,       [facility]

## 2011-12-15 NOTE — Telephone Encounter (Signed)
Pt scheduled to see Dr. Arlyce Dice 12/24/11@9 :30am. Pt aware of appt date and time.

## 2011-12-21 ENCOUNTER — Telehealth: Payer: Self-pay

## 2011-12-21 MED ORDER — BELLADONNA ALK-PHENOBARBITAL 16.2 MG PO TABS: 1.0000 | ORAL_TABLET | Freq: Two times a day (BID) | ORAL | Status: DC | PRN

## 2011-12-21 NOTE — Telephone Encounter (Signed)
RX written for Donnatal on 12/01/11, 2 concerns about rx   1.) Extend Tab is not available, the only available dose is 16.2 mg 2.) Rx was written for #60, patient uses mail order company [employer]) and will need a dispense number of 180   Please advise if these changes can be made to RX

## 2011-12-21 NOTE — Telephone Encounter (Signed)
E-prescribing error. Printed rx & faxed to pharmacy.

## 2011-12-21 NOTE — Telephone Encounter (Signed)
I changed the Rx, before we send it I recommend her to check the cost (no generic available)

## 2011-12-21 NOTE — Addendum Note (Signed)
Addended by: Edwena Felty T on: 12/21/2011 04:59 PM   Modules accepted: Orders

## 2011-12-21 NOTE — Telephone Encounter (Signed)
I called pt to let her know that the rx could possibly be very expensive & to see if she wanted to contact her pharmacy first to verify the exact amount of the rx. Pt stated that it did not matter the cost for me to go ahead & send in the rx.

## 2011-12-24 ENCOUNTER — Ambulatory Visit (INDEPENDENT_AMBULATORY_CARE_PROVIDER_SITE_OTHER): Payer: Commercial Managed Care - PPO | Admitting: Gastroenterology

## 2011-12-24 ENCOUNTER — Encounter: Payer: Self-pay | Admitting: Gastroenterology

## 2011-12-24 VITALS — BP 108/62 | HR 92 | Ht 64.0 in | Wt 129.0 lb

## 2011-12-24 DIAGNOSIS — K219 Gastro-esophageal reflux disease without esophagitis: Secondary | ICD-10-CM | POA: Diagnosis not present

## 2011-12-24 DIAGNOSIS — K222 Esophageal obstruction: Secondary | ICD-10-CM | POA: Diagnosis not present

## 2011-12-24 MED ORDER — PANTOPRAZOLE SODIUM 40 MG PO TBEC: DELAYED_RELEASE_TABLET | ORAL | Status: DC

## 2011-12-24 NOTE — Assessment & Plan Note (Signed)
Symptomatic again as evidenced by recurrent dysphagia.  Recommendations #1 upper endoscopy with dilatation as indicated

## 2011-12-24 NOTE — Assessment & Plan Note (Addendum)
Severely symptomatic on Zantac only.  Recommendations #1 DC Zantac; begin protonix 40 mg before breakfast and dinner for 2 weeks and then once daily at before breakfast

## 2011-12-24 NOTE — Patient Instructions (Addendum)
Discontinue Zantac You have been scheduled for an endoscopy with propofol. Please follow written instructions given to you at your visit today. If you use inhalers (even only as needed) or a CPAP machine, please bring them with you on the day of your procedure.

## 2011-12-24 NOTE — Progress Notes (Signed)
History of Present Illness:  [name] [name] returns complaining of pyrosis, regurgitation of gastric contents and chest fullness. She's having recurrent dysphagia to solids. An esophageal stricture was dilated 2 years ago. She is taking ranitidine at night. Symptoms occur primarily during the day. She is on no gastric irritants.    Review of Systems: Pertinent positive and negative review of systems were noted in the above HPI section. All other review of systems were otherwise negative.    Current Medications, Allergies, Past Medical History, Past Surgical History, Family History and Social History were reviewed in [employer] electronic medical record  Vital signs were reviewed in today's medical record. Physical Exam: General: Well developed , well nourished, no acute distress

## 2012-01-05 ENCOUNTER — Ambulatory Visit (INDEPENDENT_AMBULATORY_CARE_PROVIDER_SITE_OTHER): Payer: Medicare Other | Admitting: Sports Medicine

## 2012-01-05 VITALS — BP 135/78 | Ht 64.0 in | Wt 127.0 lb

## 2012-01-05 DIAGNOSIS — E119 Type 2 diabetes mellitus without complications: Secondary | ICD-10-CM | POA: Diagnosis not present

## 2012-01-05 DIAGNOSIS — M79609 Pain in unspecified limb: Secondary | ICD-10-CM | POA: Diagnosis not present

## 2012-01-05 DIAGNOSIS — M79673 Pain in unspecified foot: Secondary | ICD-10-CM

## 2012-01-05 NOTE — Assessment & Plan Note (Addendum)
Patient was fitted for a : standard, cushioned, semi-rigid orthotic. The orthotic was heated and afterward the patient stood on the orthotic blank positioned on the orthotic stand. The patient was positioned in subtalar neutral position and 10 degrees of ankle dorsiflexion in a weight bearing stance. After completion of molding, a stable base was applied to the orthotic blank. The blank was ground to a stable position for weight bearing. Size: 6 Red Base: Blue EVA Posting: Additional orthotic padding: Blue foam rubber metatarsal bar added to the forefoot of the right orthotic  I advised the patient that I think her foot pain is not true diabetic neuropathy. With this being unilateral and with some physical examination evidence of some subluxation of the right peroneus brevis tendon I am suspicious that she may have had a stretch injury to the peroneal nerve that contributes to her foot pain. I did not detect any loss of sensory function all light touch today. Pt was comfortable and walking gait neutral in orthotics

## 2012-01-05 NOTE — Progress Notes (Signed)
  Subjective:    Patient ID: [name] [name], female    DOB: 02/19/1945, 66 y.o.   MRN: [id number]  HPI   Pt presents to clinic for evaluation for new orthotics referred by Dr. Thurston Hole. Has a soft diabetic insoles made 3 yrs ago by [employer] which have been comfortable. Taking gabapentin for rt forefoot numbness x 3 years. Dx with type 1 diabetes 4 years ago. Does not require any meds for diabetes, last HGB a1c was 6.4 per pt report.  Her diabetes was thought to be related to pancreatic damage after a serious illness  She also has a history of a right ankle fracture In the right lateral ankle has always felt somewhat different  Of interest her pain in the right foot is at the ball of the forefoot A metatarsal pad does help  However this improved more with use of gabapentin at nighttime She does not complain of numbness in the foot however and has not had the symptoms on the opposite side        Review of Systems     Objective:   Physical Exam   NAD Bilateral Ankles: No visible erythema or swelling. Range of motion is full in all directions. Strength is 5/5 in all directions. Stable lateral and medial ligaments; squeeze test and kleiger test unremarkable; Talar dome nontender; No pain at base of 5th MT; No tenderness over cuboid; No tenderness over N spot or navicular prominence No tenderness on posterior aspects of lateral and medial malleolus Negative tarsal tunnel tinel's Able to walk 4 steps. Rt subluxed rt peroneus brevis tendon, not on lt  Good 1st toe motion bilat Normal foot shape bilat in seated position  Slight hammering of 4-5 toes bilat No toe splaying Rear foot straight bilat Leg lengths equal There is no true loss of the transverse arch She has only mild tenderness over the metatarsals  Hip abduction strong bilat Walking gait normal, no abnormal supination or pronation      Assessment & Plan:

## 2012-01-05 NOTE — Assessment & Plan Note (Signed)
She appears in excellent shape and weight for her age  Diabetic control appears excellent as well  No findings of stocking or glove sensory changes

## 2012-01-08 DIAGNOSIS — H905 Unspecified sensorineural hearing loss: Secondary | ICD-10-CM | POA: Diagnosis not present

## 2012-01-26 ENCOUNTER — Ambulatory Visit (AMBULATORY_SURGERY_CENTER): Payer: Medicare Other | Admitting: Gastroenterology

## 2012-01-26 ENCOUNTER — Encounter: Payer: Self-pay | Admitting: Gastroenterology

## 2012-01-26 VITALS — BP 136/85 | HR 86 | Temp 98.0°F | Resp 15 | Ht 64.0 in | Wt 129.0 lb

## 2012-01-26 DIAGNOSIS — K222 Esophageal obstruction: Secondary | ICD-10-CM | POA: Diagnosis not present

## 2012-01-26 DIAGNOSIS — K219 Gastro-esophageal reflux disease without esophagitis: Secondary | ICD-10-CM | POA: Diagnosis not present

## 2012-01-26 DIAGNOSIS — E119 Type 2 diabetes mellitus without complications: Secondary | ICD-10-CM | POA: Diagnosis not present

## 2012-01-26 LAB — GLUCOSE, CAPILLARY
Glucose-Capillary: 83 mg/dL (ref 70–99)
Glucose-Capillary: 76 mg/dL (ref 70–99)
Glucose-Capillary: 126 mg/dL — ABNORMAL HIGH (ref 70–99)

## 2012-01-26 MED ORDER — OMEPRAZOLE-SODIUM BICARBONATE 40-1100 MG PO CAPS: 1.0000 | ORAL_CAPSULE | Freq: Two times a day (BID) | ORAL | Status: DC

## 2012-01-26 MED ORDER — OMEPRAZOLE-SODIUM BICARBONATE 40-1100 MG PO CAPS: ORAL_CAPSULE | ORAL | Status: DC

## 2012-01-26 NOTE — Progress Notes (Signed)
Patient did not experience any of the following events: a burn prior to discharge; a fall within the facility; wrong site/side/patient/procedure/implant event; or a hospital transfer or hospital admission upon discharge from the facility. (G8907) Patient did not have preoperative order for IV antibiotic SSI prophylaxis. (G8918)  

## 2012-01-26 NOTE — Progress Notes (Signed)
Pt states that she is a diet controlled diabetic. She has has problems with blood sugar dropping in the past with her procedures and requested CBG to be done. Per Dorene Grebe, okay to complete. CBG 83.

## 2012-01-26 NOTE — Progress Notes (Signed)
1608 a/ox3 pleased with MAC report to [employer]

## 2012-01-26 NOTE — Op Note (Signed)
[facility] [address] [place], [address]   ENDOSCOPY PROCEDURE REPORT  PATIENT: [name], [name]  MR#: [id number] BIRTHDATE: 03-Mar-1945 , 66  yrs. old GENDER: Female ENDOSCOPIST: Louis Meckel, MD REFERRED BY: PROCEDURE DATE:  01/26/2012 PROCEDURE:  EGD, diagnostic and Maloney dilation of esophagus ASA CLASS:     Class II INDICATIONS:  Dysphagia. MEDICATIONS: MAC sedation, administered by CRNA, propofol (Diprivan) 150mg  IV, and Robinul 0.2 mg IV TOPICAL ANESTHETIC:  DESCRIPTION OF PROCEDURE: After the risks benefits and alternatives of the procedure were thoroughly explained, informed consent was obtained.  The LB GIF-H180 [id number] endoscope was introduced through the mouth and advanced to the second portion of the duodenum. Without limitations.  The instrument was slowly withdrawn as the mucosa was fully examined.        ESOPHAGUS: A stricture was found.  The stenosis was traversable with the endoscope.  The remainder of the upper endoscopy exam was otherwise normal. Retroflexed views revealed no abnormalities.     The scope was then withdrawn from the patient.  Kaplan #52 Jerene Dilling dilator was passed with moderate resistance. There was no heme  COMPLICATIONS: There were no complications. ENDOSCOPIC IMPRESSION: esophageal stricture-status post Elease Hashimoto dilation  RECOMMENDATIONS: trial of Zegerid 40 mg every morning and each bedtime Office visit 4 week  REPEAT EXAM:  eSigned:  Louis Meckel, MD 01/26/2012 4:09 PM   AO:ZHYQ Drue Novel, MD and Bishop Limbo MD

## 2012-01-26 NOTE — Patient Instructions (Addendum)
YOU HAD AN ENDOSCOPIC PROCEDURE TODAY AT THE [facility]: Refer to the procedure report that was given to you for any specific questions about what was found during the examination.  If the procedure report does not answer your questions, please call your gastroenterologist to clarify.  If you requested that your care partner not be given the details of your procedure findings, then the procedure report has been included in a sealed envelope for you to review at your convenience later.  YOU SHOULD EXPECT: Some feelings of bloating in the abdomen. Passage of more gas than usual.  Walking can help get rid of the air that was put into your GI tract during the procedure and reduce the bloating. If you had a lower endoscopy (such as a colonoscopy or flexible sigmoidoscopy) you may notice spotting of blood in your stool or on the toilet paper. If you underwent a bowel prep for your procedure, then you may not have a normal bowel movement for a few days.  DIET:  NOTHING TO EAT OR DRINK UNTIL 5 PM 5 PM UNTIL 6 PM  CLEAR LIQUIDS. AFTER 6 PM SOFT FOODS ONLY UNTIL MORNING. REGULAR DIET IN THE AM.  ACTIVITY: Your care partner should take you home directly after the procedure.  You should plan to take it easy, moving slowly for the rest of the day.  You can resume normal activity the day after the procedure however you should NOT DRIVE or use heavy machinery for 24 hours (because of the sedation medicines used during the test).    SYMPTOMS TO REPORT IMMEDIATELY: A gastroenterologist can be reached at any hour.  During normal business hours, 8:30 AM to 5:00 PM Monday through Friday, call [phone].  After hours and on weekends, please call the GI answering service at [phone] who will take a message and have the physician on call contact you.  Following upper endoscopy (EGD)  Vomiting of blood or coffee ground material  New chest pain or pain under the shoulder blades  Painful or  persistently difficult swallowing  New shortness of breath  Fever of 100F or higher  Black, tarry-looking stools  FOLLOW UP: If any biopsies were taken you will be contacted by phone or by letter within the next 1-3 weeks.  Call your gastroenterologist if you have not heard about the biopsies in 3 weeks.  Our staff will call the home number listed on your records the next business day following your procedure to check on you and address any questions or concerns that you may have at that time regarding the information given to you following your procedure. This is a courtesy call and so if there is no answer at the home number and we have not heard from you through the emergency physician on call, we will assume that you have returned to your regular daily activities without incident.  SIGNATURES/CONFIDENTIALITY: You and/or your care partner have signed paperwork which will be entered into your electronic medical record.  These signatures attest to the fact that that the information above on your After Visit Summary has been reviewed and is understood.  Full responsibility of the confidentiality of this discharge information lies with you and/or your care-partner.

## 2012-01-26 NOTE — Progress Notes (Signed)
PATIENT'S BLOOD SUGAR 76 ON ARRIVAL TO RECOVERY. DISCUSSED WITH DR. KAPLAN. D5W HUNG, 100 ML INTAKE OF D5W, BLOOD SUGAR THEN 126. IV DC.

## 2012-01-26 NOTE — Progress Notes (Signed)
Pt c/o fingers on right hand below being purple. Wrapped hand in warm blanket as they feel cool to touch. No swelling noted, quick capillary refill, strong pulses. Reassurance offered. Had N. Seacrist, RN look with patient and I and reassure patient as well. Pt states IV site is uncomfortable of which she was informed but of all sites this was the best site to stick without possibility of multiple sticks. Pt was in agreement when the site was chosen.

## 2012-01-27 ENCOUNTER — Telehealth: Payer: Self-pay | Admitting: *Deleted

## 2012-01-27 NOTE — Telephone Encounter (Signed)
  Follow up Call-  Call back number 01/26/2012 10/21/2011  Post procedure Call Back phone  # [phone]  Permission to leave phone message Yes Yes     Patient questions:  Do you have a fever, pain , or abdominal swelling? no Pain Score  0 *  Have you tolerated food without any problems? yes  Have you been able to return to your normal activities? yes  Do you have any questions about your discharge instructions: Diet   no Medications  no Follow up visit  no  Do you have questions or concerns about your Care? no  Actions: * If pain score is 4 or above: No action needed, pain <4.

## 2012-02-02 ENCOUNTER — Other Ambulatory Visit: Payer: Self-pay | Admitting: *Deleted

## 2012-02-02 MED ORDER — ROSUVASTATIN CALCIUM 10 MG PO TABS: 10.0000 mg | ORAL_TABLET | Freq: Every day | ORAL | Status: DC

## 2012-02-02 NOTE — Telephone Encounter (Signed)
Rx sent, Pt aware. 

## 2012-02-09 ENCOUNTER — Telehealth: Payer: Self-pay

## 2012-02-09 NOTE — Telephone Encounter (Signed)
Message copied by Chrystie Nose on Tue Feb 09, 2012 11:16 AM ------      Message from: Melvia Heaps D      Created: Mon Feb 08, 2012 10:38 AM       Please contact the patient to see if she's feeling any better with Zegerid as compared to protonix. If so I'm going to need to write a letter to [employer].             thanks

## 2012-02-09 NOTE — Telephone Encounter (Signed)
Called and pt not at home. Will try back later today.  Called and spoke with pt and she states that the zegerid seems to be helping her better than the Protonix did, she feels better.

## 2012-02-29 ENCOUNTER — Encounter: Payer: Self-pay | Admitting: Gastroenterology

## 2012-02-29 ENCOUNTER — Ambulatory Visit (INDEPENDENT_AMBULATORY_CARE_PROVIDER_SITE_OTHER): Payer: Medicare Other | Admitting: Gastroenterology

## 2012-02-29 VITALS — BP 116/70 | HR 60 | Ht 64.0 in | Wt 133.0 lb

## 2012-02-29 DIAGNOSIS — K222 Esophageal obstruction: Secondary | ICD-10-CM | POA: Diagnosis not present

## 2012-02-29 DIAGNOSIS — K219 Gastro-esophageal reflux disease without esophagitis: Secondary | ICD-10-CM

## 2012-02-29 NOTE — Progress Notes (Signed)
History of Present Illness:  The patient returns following upper endoscopy with [facility] dilation of a distal esophageal stricture. Dysphagia has entirely subsided. On a regimen of Zegerid each bedtime reflux symptoms are also significantly improved. She is without pyrosis. Hoarseness has improved and she is no longer coughing.    Review of Systems: Pertinent positive and negative review of systems were noted in the above HPI section. All other review of systems were otherwise negative.    Current Medications, Allergies, Past Medical History, Past Surgical History, Family History and Social History were reviewed in [employer] electronic medical record  Vital signs were reviewed in today's medical record. Physical Exam: General: Well developed , well nourished, no acute distress

## 2012-02-29 NOTE — Patient Instructions (Addendum)
Follow up as needed

## 2012-02-29 NOTE — Assessment & Plan Note (Signed)
She has had an excellent response to  bedtime Zegerid

## 2012-02-29 NOTE — Assessment & Plan Note (Signed)
Asymptomatic following dilatation therapy

## 2012-04-11 DIAGNOSIS — H2510 Age-related nuclear cataract, unspecified eye: Secondary | ICD-10-CM | POA: Diagnosis not present

## 2012-04-11 DIAGNOSIS — H10529 Angular blepharoconjunctivitis, unspecified eye: Secondary | ICD-10-CM | POA: Diagnosis not present

## 2012-04-11 DIAGNOSIS — H04129 Dry eye syndrome of unspecified lacrimal gland: Secondary | ICD-10-CM | POA: Diagnosis not present

## 2012-04-13 ENCOUNTER — Other Ambulatory Visit (INDEPENDENT_AMBULATORY_CARE_PROVIDER_SITE_OTHER): Payer: Medicare Other

## 2012-04-13 ENCOUNTER — Ambulatory Visit (INDEPENDENT_AMBULATORY_CARE_PROVIDER_SITE_OTHER): Payer: Medicare Other | Admitting: Gastroenterology

## 2012-04-13 ENCOUNTER — Telehealth: Payer: Self-pay | Admitting: Gastroenterology

## 2012-04-13 ENCOUNTER — Encounter: Payer: Self-pay | Admitting: Gastroenterology

## 2012-04-13 VITALS — BP 120/70 | HR 92 | Ht 64.0 in | Wt 131.1 lb

## 2012-04-13 DIAGNOSIS — R1012 Left upper quadrant pain: Secondary | ICD-10-CM

## 2012-04-13 LAB — BASIC METABOLIC PANEL
BUN: 25 mg/dL — ABNORMAL HIGH (ref 6–23)
CO2: 33 mEq/L — ABNORMAL HIGH (ref 19–32)
Calcium: 10.3 mg/dL (ref 8.4–10.5)
Chloride: 100 mEq/L (ref 96–112)
Creatinine, Ser: 1 mg/dL (ref 0.4–1.2)
GFR: 61.76 mL/min (ref >60.00)
Glucose, Bld: 121 mg/dL — ABNORMAL HIGH (ref 70–99)
Potassium: 4.8 mEq/L (ref 3.5–5.1)
Sodium: 141 mEq/L (ref 135–145)

## 2012-04-13 NOTE — Telephone Encounter (Signed)
Pt states that the zegerid Dr. Arlyce Dice gave her in January does not seem to be helping her like it was. States she is having a lot of abdominal pain on her left side and is requesting to be seen. Pt scheduled to see Doug Sou PA today at 3pm. Pt aware of appt date and time.

## 2012-04-13 NOTE — Patient Instructions (Addendum)
Your physician has requested that you go to the basement for the following lab work before leaving today:  BMET   You have been scheduled for a CT scan of the abdomen at [facility] ([address]---this is in the same building as Architectural technologist).   You are scheduled on 04-19-12 at 10:00am. You should arrive 15 minutes prior to your appointment time for registration. Please follow the written instructions below on the day of your exam:  WARNING: IF YOU ARE ALLERGIC TO IODINE/X-RAY DYE, PLEASE NOTIFY RADIOLOGY IMMEDIATELY AT [phone]! YOU WILL BE GIVEN A 13 HOUR PREMEDICATION PREP.  1) Do not eat or drink anything after 6:00am (4 hours prior to your test) 2) You have been given 2 bottles of oral contrast to drink. The solution may taste better if refrigerated, but do NOT add ice or any other liquid to this solution. Shake well before drinking.    Drink 1 bottle of contrast @ 8:00am (2 hours prior to your exam)  Drink 1 bottle of contrast @ 9:00am (1 hour prior to your exam)  You may take any medications as prescribed with a small amount of water except for the following: Metformin, Glucophage, Glucovance, Avandamet, Riomet, Fortamet, Actoplus Met, Janumet, Glumetza or Metaglip. The above medications must be held the day of the exam AND 48 hours after the exam.  The purpose of you drinking the oral contrast is to aid in the visualization of your intestinal tract. The contrast solution may cause some diarrhea. Before your exam is started, you will be given a small amount of fluid to drink. Depending on your individual set of symptoms, you may also receive an intravenous injection of x-ray contrast/dye. Plan on being at [facility] for 30 minutes or long, depending on the type of exam you are having performed.  If you have any questions regarding your exam or if you need to reschedule, you may call the CT department at [phone] between the hours of 8:00 am and 5:00 pm,  Monday-Friday.  ________________________________________________________________________

## 2012-04-13 NOTE — Progress Notes (Signed)
04/13/2012 [name] [name] [id number] 1945/04/26   History of Present Illness: Patient is a pleasant 67 year old female who presents to our office today with complaints of LUQ abdominal pain.  She recently had an EGD with Dr. Arlyce Dice in January and had an esophageal esophageal stricture that was dilated.  She was placed on Zegerid BID and her reflux had been doing ok with that medication.  She says that prior to her EGD she had started experiencing LUQ abdominal pain, which also seemed to get a little better at first after starting the Zegerid, but has now returned.  She feels that it is not related to her reflux and says that it is very sore to the touch.  Not really associated with any other symptoms.  Current Medications, Allergies, Past Medical History, Past Surgical History, Family History and Social History were reviewed in [employer] record.   Physical Exam: BP 120/70  Pulse 92  Ht 5\' 4"  (1.626 m)  Wt 131 lb 2 oz (59.478 kg)  BMI 22.5 kg/m2 General: Well developed, white female in no acute distress Head: Normocephalic and atraumatic Eyes:  sclerae anicteric, conjunctiva pink  Ears: Normal auditory acuity Lungs: Clear throughout to auscultation Heart: Regular rate and rhythm Abdomen: Soft, non-distended. No masses, no hepatomegaly. Normal bowel sounds.  LUQ TTP on deep palpation, without R/R/G. Musculoskeletal: Symmetrical with no gross deformities  Extremities: No edema  Neurological: Alert oriented x 4, grossly nonfocal Psychological:  Alert and cooperative. Normal mood and affect.  Assessment and Recommendations: -LUQ abdominal pain, unsure of the cause of her pain, but she does have some tenderness.  We will check a CT scan of the abdomen with contrast for evaluation.  Patient is very appreciative of the imaging study; says that will make her feel much better to have it evaluated.

## 2012-04-14 NOTE — Progress Notes (Signed)
Reviewed and agree with management. Robert D. Kaplan, M.D., FACG  

## 2012-04-19 ENCOUNTER — Telehealth: Payer: Self-pay | Admitting: *Deleted

## 2012-04-19 ENCOUNTER — Ambulatory Visit (INDEPENDENT_AMBULATORY_CARE_PROVIDER_SITE_OTHER)
Admission: RE | Admit: 2012-04-19 | Discharge: 2012-04-19 | Disposition: A | Discharge status: Home / Self Care | Payer: Medicare Other | Source: Ambulatory Visit | Attending: Gastroenterology | Admitting: Gastroenterology

## 2012-04-19 ENCOUNTER — Telehealth: Payer: Self-pay | Admitting: Internal Medicine

## 2012-04-19 DIAGNOSIS — R109 Unspecified abdominal pain: Secondary | ICD-10-CM | POA: Diagnosis not present

## 2012-04-19 DIAGNOSIS — R1012 Left upper quadrant pain: Secondary | ICD-10-CM | POA: Diagnosis not present

## 2012-04-19 DIAGNOSIS — R110 Nausea: Secondary | ICD-10-CM | POA: Diagnosis not present

## 2012-04-19 MED ORDER — IOHEXOL 300 MG/ML  SOLN
80.0000 mL | Freq: Once | INTRAMUSCULAR | Status: AC | PRN
  Administered 2012-04-19: 80 mL via INTRAVENOUS

## 2012-04-19 NOTE — Telephone Encounter (Signed)
Ok to refill? Last OV 11.12.13 Last filled 9.13.13

## 2012-04-19 NOTE — Telephone Encounter (Signed)
Spoke with patient and gave her results and recommendations. She has Bentyl rx already and will try it. Scheduled OV with Dr. Arlyce Dice on 05/16/12 at 10:30 AM. Mailed patient a copy of CT as per her request.

## 2012-04-19 NOTE — Telephone Encounter (Signed)
Message copied by Daphine Deutscher on Tue Apr 19, 2012  2:18 PM ------      Message from: Doug Sou D      Created: Tue Apr 19, 2012  1:26 PM      Regarding: CT scan results       Rene Kocher,            I accidentally signed off of her results before sending you a message.  Can you please let her know that her CT scan results were normal.  I do not know what is causing her abdominal pain.  We can offer her levsin or bentyl for cramping and she should have a follow-up in a few weeks with Dr. Arlyce Dice if she is still having pain.            Thank you,            Jess ------

## 2012-04-20 NOTE — Telephone Encounter (Signed)
Left detailed msg on pt's vmail for pt to return call & schedule OV.  rx sent to pharmacy.

## 2012-04-20 NOTE — Telephone Encounter (Signed)
RF done, only 30, no RF Schedule a ROV please

## 2012-04-22 ENCOUNTER — Encounter: Payer: Self-pay | Admitting: Lab

## 2012-04-22 DIAGNOSIS — E119 Type 2 diabetes mellitus without complications: Secondary | ICD-10-CM | POA: Diagnosis not present

## 2012-04-22 DIAGNOSIS — E039 Hypothyroidism, unspecified: Secondary | ICD-10-CM | POA: Diagnosis not present

## 2012-04-22 DIAGNOSIS — E7800 Pure hypercholesterolemia, unspecified: Secondary | ICD-10-CM | POA: Diagnosis not present

## 2012-04-25 ENCOUNTER — Encounter: Payer: Self-pay | Admitting: Internal Medicine

## 2012-04-25 ENCOUNTER — Ambulatory Visit (INDEPENDENT_AMBULATORY_CARE_PROVIDER_SITE_OTHER): Payer: Medicare Other | Admitting: Internal Medicine

## 2012-04-25 VITALS — BP 116/72 | HR 89 | Temp 98.1°F | Wt 131.0 lb

## 2012-04-25 DIAGNOSIS — R109 Unspecified abdominal pain: Secondary | ICD-10-CM | POA: Diagnosis not present

## 2012-04-25 DIAGNOSIS — E039 Hypothyroidism, unspecified: Secondary | ICD-10-CM | POA: Diagnosis not present

## 2012-04-25 DIAGNOSIS — G500 Trigeminal neuralgia: Secondary | ICD-10-CM

## 2012-04-25 DIAGNOSIS — E785 Hyperlipidemia, unspecified: Secondary | ICD-10-CM

## 2012-04-25 DIAGNOSIS — E119 Type 2 diabetes mellitus without complications: Secondary | ICD-10-CM

## 2012-04-25 NOTE — Assessment & Plan Note (Signed)
Followup by endocrinology, she had labs few days ago, to see endocrinology tomorrow.

## 2012-04-25 NOTE — Assessment & Plan Note (Signed)
Patient reports endocrinology just check her cholesterol. She will ask  Dr. Lisabeth Devoid to send me records

## 2012-04-25 NOTE — Assessment & Plan Note (Signed)
Currently on Neurontin and Elavil, symptoms well controlled.

## 2012-04-25 NOTE — Assessment & Plan Note (Signed)
Per GI, GI notes reviewed

## 2012-04-25 NOTE — Progress Notes (Signed)
  Subjective:    Patient ID: [name] [name], female    DOB: 1945/02/03, 67 y.o.   MRN: [id number]  HPI Routine office visit Abdominal pain, since the last time she was here, she had multiple visits w/ GI and recently a CT of the abdomen for further eval of abdominal pain. CT was negative. Have a ecchymoses at the right arm were IV was inserted for the CT, there is a slightly sore but actually looking better than few days ago High cholesterol-Crestor apparently not causing any side effects.  diabetes, she eats healthy, ambulatory blood sugars 106, once daily.  Past Medical History:   DM II   Hyperlipidemia   Osteopenia   Hypothyroidism   anxiety   GI:   Diverticulosis, colon   GERD   4-09---abdominal pain: EGD and CTs neg.Marland KitchenRx amytriptiline (non ulcer dyspepsia)   EGD 12-11.............s/p balloon dilitation   DERM:   Melanoma   solar lentigo, face ; s/p excision 3-09. Sees Dermatology q 6 months   Trigeminal Neuralgia   HA, ER x 2 ~ 01-2011 Chronic conjunctivitis  Past Surgical History:   Cervical laminectomy   Cholecystectomy   Hysterectomy ans oophorectomy   RT SHOULDER SURGERY   COLON RESECTION 1999 d/t diverticulitis ; Colonoscopy 12/01/07 :Tics, Dr Arlyce Dice   PTX,S/P chest tube   Right Arm Surgery to remove melanoma    Family History:   Colon Cancer:no   Prostate Cancer--F   breast ca--no   Sister, endometrial Ca and Clear cell carcinoma   melanoma--uncle   Diabetes-- no   MI-- GF in his 5s   Stroke-- GM    Social History:   Retired , Married, 2 boys   Patient is a former smoker. 30 yrs ago   Alcohol Use - yes/ socially   Patient gets regular exercise, x3/week   Illicit Drug Use - no     Review of Systems We went over her medications these, and good compliance. Denies chest pain or shortness or breath No nausea, vomiting, diarrhea or blood in the stools. Some anxiety, lost her mom last month    Objective:   Physical Exam  Skin:      General --  alert, well-developed, NAD Lungs -- normal respiratory effort, no intercostal retractions, no accessory muscle use, and normal breath sounds.   Heart-- normal rate, regular rhythm, no murmur, and no gallop.   Abdomen--soft, non-tender, no distention, no masses, no HSM, no guarding, and no rigidity.   Extremities-- no pretibial edema bilaterally Psych-- Cognition and judgment appear intact. Alert and cooperative with normal attention span and concentration.  not anxious appearing and not depressed appearing.       Assessment & Plan:

## 2012-04-25 NOTE — Assessment & Plan Note (Signed)
Per endocrinology 

## 2012-04-26 DIAGNOSIS — G609 Hereditary and idiopathic neuropathy, unspecified: Secondary | ICD-10-CM | POA: Diagnosis not present

## 2012-04-26 DIAGNOSIS — E039 Hypothyroidism, unspecified: Secondary | ICD-10-CM | POA: Diagnosis not present

## 2012-04-26 DIAGNOSIS — E7800 Pure hypercholesterolemia, unspecified: Secondary | ICD-10-CM | POA: Diagnosis not present

## 2012-04-26 DIAGNOSIS — M810 Age-related osteoporosis without current pathological fracture: Secondary | ICD-10-CM | POA: Diagnosis not present

## 2012-04-26 DIAGNOSIS — E119 Type 2 diabetes mellitus without complications: Secondary | ICD-10-CM | POA: Diagnosis not present

## 2012-05-04 DIAGNOSIS — H10529 Angular blepharoconjunctivitis, unspecified eye: Secondary | ICD-10-CM | POA: Diagnosis not present

## 2012-05-04 DIAGNOSIS — H04129 Dry eye syndrome of unspecified lacrimal gland: Secondary | ICD-10-CM | POA: Diagnosis not present

## 2012-05-04 DIAGNOSIS — H2510 Age-related nuclear cataract, unspecified eye: Secondary | ICD-10-CM | POA: Diagnosis not present

## 2012-05-09 ENCOUNTER — Telehealth: Payer: Self-pay | Admitting: Internal Medicine

## 2012-05-09 ENCOUNTER — Telehealth: Payer: Self-pay | Admitting: *Deleted

## 2012-05-09 MED ORDER — ALPRAZOLAM 0.5 MG PO TABS: 0.5000 mg | ORAL_TABLET | Freq: Every evening | ORAL | Status: DC | PRN

## 2012-05-09 MED ORDER — ROSUVASTATIN CALCIUM 10 MG PO TABS: 10.0000 mg | ORAL_TABLET | Freq: Every day | ORAL | Status: DC

## 2012-05-09 NOTE — Telephone Encounter (Signed)
Refill request for alprazolam 0.5mg  Last OV 4.21.14

## 2012-05-09 NOTE — Telephone Encounter (Signed)
Refill done.  

## 2012-05-09 NOTE — Telephone Encounter (Signed)
Refill- crestor 10mg  tab. Take one tablet by mouth daily. Qty 90 last fill 2.19.14

## 2012-05-09 NOTE — Telephone Encounter (Signed)
Done

## 2012-05-10 ENCOUNTER — Telehealth: Payer: Self-pay | Admitting: General Practice

## 2012-05-10 MED ORDER — ALPRAZOLAM 0.5 MG PO TABS: 0.5000 mg | ORAL_TABLET | Freq: Every evening | ORAL | Status: DC | PRN

## 2012-05-10 NOTE — Telephone Encounter (Signed)
Pt left msg on vmail stating that her mail order pharmacy will not have her meds to her in time & is requesting a 30-day supply of xanax be sent to [facility] in [place].

## 2012-05-10 NOTE — Telephone Encounter (Signed)
Pt pharmacy ( [facility]) called stating that since they are a mail order pharmacy they cannot fill the Pt's XANAX for 30 days the Rx has to be for 90 days. Please advise if this is ok.

## 2012-05-10 NOTE — Telephone Encounter (Signed)
I see that you just sent 30 tablets, agree.

## 2012-05-16 ENCOUNTER — Ambulatory Visit: Payer: Medicare Other | Admitting: Gastroenterology

## 2012-05-30 ENCOUNTER — Ambulatory Visit (INDEPENDENT_AMBULATORY_CARE_PROVIDER_SITE_OTHER): Payer: Medicare Other | Admitting: Nurse Practitioner

## 2012-05-30 ENCOUNTER — Encounter: Payer: Self-pay | Admitting: Nurse Practitioner

## 2012-05-30 VITALS — BP 114/67 | HR 84

## 2012-05-30 DIAGNOSIS — R51 Headache: Secondary | ICD-10-CM

## 2012-05-30 DIAGNOSIS — G500 Trigeminal neuralgia: Secondary | ICD-10-CM

## 2012-05-30 MED ORDER — GABAPENTIN 300 MG PO CAPS: 300.0000 mg | ORAL_CAPSULE | Freq: Every day | ORAL | Status: DC

## 2012-05-30 NOTE — Progress Notes (Signed)
I have read the note, and I agree with the clinical assessment and plan.  

## 2012-05-30 NOTE — Progress Notes (Signed)
HPI: Patient returns for followup after last visit 11/30/2011. She has a history of cervical spondylosis and headaches,  she also has pain consistent with left trigeminal neuralgia. Her symptoms have been well-controlled on amitriptyline and gabapentin. She feels that the gabapentin dose currently makes her very groggy and hung over the next day she is currently taking 600 mg. She continues to exercise, headaches are in good control she has not had problems with her neck recently. She has bilateral hearing aids since the age of 65. No new neurologic complaints  ROS:  - hearing loss,  dizziness, snoring   Physical Exam General: well developed, well nourished, seated, in no evident distress Head: head normocephalic and atraumatic. Oropharynx benign Neck: supple with no carotid or supraclavicular bruits Cardiovascular: regular rate and rhythm, no murmurs  Neurologic Exam Mental Status: Awake and fully alert. Oriented to place and time. Speech is normal, no aphasia or dysarthria is noted  Cranial Nerves: Fundoscopic exam reveals sharp disc margins. Pupils equal, briskly reactive to light. Extraocular movements full without nystagmus. Visual fields full to confrontation. Hearing intact with bilateral hearing aids . Facial sensation intact. Face, tongue, palate move normally and symmetrically. Neck flexion and extension normal.  Motor: Normal bulk and tone. Normal strength in all tested extremity muscles. No focal weakness Sensory.: intact to touch and pinprick and vibratory.  Coordination: Rapid alternating movements normal in all extremities. Finger-to-nose and heel-to-shin performed accurately bilaterally. Gait and Station: Arises from chair without difficulty. Stance is normal. Gait demonstrates normal stride length and balance . Able to heel, toe and tandem walk without difficulty.  Reflexes: 2+ and symmetric. Toes downgoing.     ASSESSMENT: Trigeminal neuralgia Muscle tension  headaches     PLAN: Patient will decrease gabapentin to 300 mg at night She will continue her amitriptyline at the current dose She was encouraged to continue her exercise routine  She will followup in 6 months   Nilda Riggs, GNP-BC APRN

## 2012-05-30 NOTE — Patient Instructions (Addendum)
Gabapentin 300,  half dose if necessary Continue amitriptyline current dose continue current exercise program Followup in 6 months

## 2012-06-01 ENCOUNTER — Other Ambulatory Visit: Payer: Self-pay | Admitting: Internal Medicine

## 2012-06-01 DIAGNOSIS — Z124 Encounter for screening for malignant neoplasm of cervix: Secondary | ICD-10-CM | POA: Diagnosis not present

## 2012-06-01 NOTE — Telephone Encounter (Signed)
Last OV 04-25-12 , last filled 05-10-12 #30. Pt request 90 day supply with refills

## 2012-06-02 ENCOUNTER — Telehealth: Payer: Self-pay | Admitting: General Practice

## 2012-06-02 NOTE — Telephone Encounter (Signed)
[name] [name] #30 to be phoned in.

## 2012-06-02 NOTE — Telephone Encounter (Signed)
Alprazolam refill request. Pt last seen on 04-25-12, med last filled 05-10-12 #30 with 0 refills. Please advise.

## 2012-06-02 NOTE — Telephone Encounter (Signed)
Med phoned in °

## 2012-06-02 NOTE — Telephone Encounter (Signed)
Is a little early but okay to call #30 and one refill

## 2012-06-03 NOTE — Telephone Encounter (Signed)
thanks

## 2012-06-06 DIAGNOSIS — Z85820 Personal history of malignant melanoma of skin: Secondary | ICD-10-CM | POA: Diagnosis not present

## 2012-06-06 DIAGNOSIS — D219 Benign neoplasm of connective and other soft tissue, unspecified: Secondary | ICD-10-CM | POA: Diagnosis not present

## 2012-06-06 DIAGNOSIS — D485 Neoplasm of uncertain behavior of skin: Secondary | ICD-10-CM | POA: Diagnosis not present

## 2012-06-06 DIAGNOSIS — L821 Other seborrheic keratosis: Secondary | ICD-10-CM | POA: Diagnosis not present

## 2012-06-06 DIAGNOSIS — D1801 Hemangioma of skin and subcutaneous tissue: Secondary | ICD-10-CM | POA: Diagnosis not present

## 2012-06-06 DIAGNOSIS — D239 Other benign neoplasm of skin, unspecified: Secondary | ICD-10-CM | POA: Diagnosis not present

## 2012-06-06 DIAGNOSIS — C4330 Malignant melanoma of unspecified part of face: Secondary | ICD-10-CM | POA: Diagnosis not present

## 2012-06-15 ENCOUNTER — Other Ambulatory Visit (HOSPITAL_COMMUNITY): Payer: Self-pay | Admitting: Obstetrics and Gynecology

## 2012-06-15 DIAGNOSIS — Z1231 Encounter for screening mammogram for malignant neoplasm of breast: Secondary | ICD-10-CM

## 2012-06-16 DIAGNOSIS — C4330 Malignant melanoma of unspecified part of face: Secondary | ICD-10-CM | POA: Diagnosis not present

## 2012-07-03 ENCOUNTER — Other Ambulatory Visit: Payer: Self-pay | Admitting: Internal Medicine

## 2012-07-04 DIAGNOSIS — C4330 Malignant melanoma of unspecified part of face: Secondary | ICD-10-CM | POA: Diagnosis not present

## 2012-07-04 NOTE — Telephone Encounter (Signed)
Needs controlled substance contract. Okay to refill for 6 months

## 2012-07-04 NOTE — Telephone Encounter (Signed)
O

## 2012-07-04 NOTE — Telephone Encounter (Signed)
Ok to refill? Last OV 6.16.14 Last filled 4.22.14

## 2012-07-05 ENCOUNTER — Encounter: Payer: Self-pay | Admitting: *Deleted

## 2012-07-05 DIAGNOSIS — C4330 Malignant melanoma of unspecified part of face: Secondary | ICD-10-CM | POA: Diagnosis not present

## 2012-07-05 NOTE — Telephone Encounter (Signed)
Spoke to pt & she is having melanoma removed & will not be able to come in to sign controlled substance contract. Per Dois Davenport, OK to send rx to pharmacy for 1 month & have pt come back later to sign controlled substance contract.

## 2012-07-18 ENCOUNTER — Ambulatory Visit (HOSPITAL_COMMUNITY)
Admission: RE | Admit: 2012-07-18 | Discharge: 2012-07-18 | Disposition: A | Discharge status: Home / Self Care | Payer: Medicare Other | Source: Ambulatory Visit | Attending: Obstetrics and Gynecology | Admitting: Obstetrics and Gynecology

## 2012-07-18 DIAGNOSIS — Z1231 Encounter for screening mammogram for malignant neoplasm of breast: Secondary | ICD-10-CM

## 2012-08-01 ENCOUNTER — Other Ambulatory Visit: Payer: Self-pay | Admitting: Internal Medicine

## 2012-08-01 NOTE — Telephone Encounter (Signed)
Spoke with patient. Verbalized understanding. Pt. To come to office tomorrow to complete controlled substance contract and pick up rx. Front desk notified of need for UDS and controlled substance contract.

## 2012-08-01 NOTE — Telephone Encounter (Signed)
Ok to refill? Last OV 4.7.14 Last filled on 6.15.14 # 30 no refills because patient did not come in to sign controlled substance contract. (none on file) Please advise. Thanks.

## 2012-08-01 NOTE — Telephone Encounter (Signed)
Call patient, please explain her that a controlled substance agreement is now the standard of care on every patient in the office. Ok one month supply, needs to sign the contract and provide a sample for the  UDS.

## 2012-08-03 DIAGNOSIS — H2510 Age-related nuclear cataract, unspecified eye: Secondary | ICD-10-CM | POA: Diagnosis not present

## 2012-08-03 DIAGNOSIS — H04129 Dry eye syndrome of unspecified lacrimal gland: Secondary | ICD-10-CM | POA: Diagnosis not present

## 2012-08-03 DIAGNOSIS — H01019 Ulcerative blepharitis unspecified eye, unspecified eyelid: Secondary | ICD-10-CM | POA: Diagnosis not present

## 2012-08-04 ENCOUNTER — Encounter: Payer: Self-pay | Admitting: Internal Medicine

## 2012-08-04 DIAGNOSIS — Z79899 Other long term (current) drug therapy: Secondary | ICD-10-CM | POA: Diagnosis not present

## 2012-08-21 ENCOUNTER — Encounter: Payer: Self-pay | Admitting: Internal Medicine

## 2012-08-22 NOTE — Telephone Encounter (Signed)
Please advise 

## 2012-08-23 ENCOUNTER — Telehealth: Payer: Self-pay | Admitting: *Deleted

## 2012-08-23 MED ORDER — ALPRAZOLAM 0.5 MG PO TABS: ORAL_TABLET | ORAL | Status: DC

## 2012-08-23 NOTE — Telephone Encounter (Signed)
Message copied by Shirlee More I on Tue Aug 23, 2012  1:00 PM ------      Message from: Willow Ora E      Created: Tue Aug 23, 2012 12:49 PM       Please RF alprazolam #90 and 1RF  To her mail order pharmacy ------

## 2012-08-23 NOTE — Telephone Encounter (Signed)
Refill done per orders and faxed to mail order pharmacy.

## 2012-08-26 ENCOUNTER — Encounter: Payer: Self-pay | Admitting: Internal Medicine

## 2012-09-05 DIAGNOSIS — Z85820 Personal history of malignant melanoma of skin: Secondary | ICD-10-CM | POA: Diagnosis not present

## 2012-09-05 DIAGNOSIS — L259 Unspecified contact dermatitis, unspecified cause: Secondary | ICD-10-CM | POA: Diagnosis not present

## 2012-09-12 DIAGNOSIS — H04129 Dry eye syndrome of unspecified lacrimal gland: Secondary | ICD-10-CM | POA: Diagnosis not present

## 2012-09-12 DIAGNOSIS — H2510 Age-related nuclear cataract, unspecified eye: Secondary | ICD-10-CM | POA: Diagnosis not present

## 2012-09-12 DIAGNOSIS — H01029 Squamous blepharitis unspecified eye, unspecified eyelid: Secondary | ICD-10-CM | POA: Diagnosis not present

## 2012-09-26 ENCOUNTER — Telehealth: Payer: Self-pay | Admitting: Internal Medicine

## 2012-09-26 DIAGNOSIS — H9193 Unspecified hearing loss, bilateral: Secondary | ICD-10-CM

## 2012-09-26 NOTE — Telephone Encounter (Signed)
Patient is requesting a new referral to the hearing clinic fax # [phone]

## 2012-09-27 NOTE — Telephone Encounter (Signed)
Ok, please do.

## 2012-09-27 NOTE — Telephone Encounter (Signed)
Audiology referral ordered. DJR

## 2012-10-03 DIAGNOSIS — M899 Disorder of bone, unspecified: Secondary | ICD-10-CM | POA: Diagnosis not present

## 2012-10-03 DIAGNOSIS — Z13820 Encounter for screening for osteoporosis: Secondary | ICD-10-CM | POA: Diagnosis not present

## 2012-10-12 DIAGNOSIS — H903 Sensorineural hearing loss, bilateral: Secondary | ICD-10-CM | POA: Diagnosis not present

## 2012-10-13 DIAGNOSIS — Z85820 Personal history of malignant melanoma of skin: Secondary | ICD-10-CM | POA: Diagnosis not present

## 2012-10-13 DIAGNOSIS — D239 Other benign neoplasm of skin, unspecified: Secondary | ICD-10-CM | POA: Diagnosis not present

## 2012-10-13 DIAGNOSIS — L821 Other seborrheic keratosis: Secondary | ICD-10-CM | POA: Diagnosis not present

## 2012-10-13 DIAGNOSIS — D485 Neoplasm of uncertain behavior of skin: Secondary | ICD-10-CM | POA: Diagnosis not present

## 2012-10-13 DIAGNOSIS — L819 Disorder of pigmentation, unspecified: Secondary | ICD-10-CM | POA: Diagnosis not present

## 2012-10-13 DIAGNOSIS — D1801 Hemangioma of skin and subcutaneous tissue: Secondary | ICD-10-CM | POA: Diagnosis not present

## 2012-10-17 ENCOUNTER — Other Ambulatory Visit: Payer: Self-pay | Admitting: Neurology

## 2012-10-17 ENCOUNTER — Other Ambulatory Visit: Payer: Self-pay | Admitting: Endocrinology

## 2012-10-17 DIAGNOSIS — E049 Nontoxic goiter, unspecified: Secondary | ICD-10-CM

## 2012-10-21 ENCOUNTER — Telehealth: Payer: Self-pay | Admitting: Neurology

## 2012-10-21 ENCOUNTER — Other Ambulatory Visit: Payer: Self-pay

## 2012-10-21 MED ORDER — AMITRIPTYLINE HCL 25 MG PO TABS: 25.0000 mg | ORAL_TABLET | Freq: Every day | ORAL | Status: DC

## 2012-10-21 NOTE — Telephone Encounter (Signed)
Patient called requesting a refill on Amitriptyline.  She would like the Rx sent to mail order.

## 2012-10-24 ENCOUNTER — Telehealth: Payer: Self-pay | Admitting: Neurology

## 2012-10-24 NOTE — Telephone Encounter (Signed)
Duplicate message.  Rx was sent on 10/03

## 2012-10-25 DIAGNOSIS — E119 Type 2 diabetes mellitus without complications: Secondary | ICD-10-CM | POA: Diagnosis not present

## 2012-10-25 DIAGNOSIS — E7800 Pure hypercholesterolemia, unspecified: Secondary | ICD-10-CM | POA: Diagnosis not present

## 2012-10-25 DIAGNOSIS — E039 Hypothyroidism, unspecified: Secondary | ICD-10-CM | POA: Diagnosis not present

## 2012-10-28 DIAGNOSIS — Z23 Encounter for immunization: Secondary | ICD-10-CM | POA: Diagnosis not present

## 2012-10-28 DIAGNOSIS — E119 Type 2 diabetes mellitus without complications: Secondary | ICD-10-CM | POA: Diagnosis not present

## 2012-10-28 DIAGNOSIS — G609 Hereditary and idiopathic neuropathy, unspecified: Secondary | ICD-10-CM | POA: Diagnosis not present

## 2012-10-28 DIAGNOSIS — E039 Hypothyroidism, unspecified: Secondary | ICD-10-CM | POA: Diagnosis not present

## 2012-10-28 DIAGNOSIS — M810 Age-related osteoporosis without current pathological fracture: Secondary | ICD-10-CM | POA: Diagnosis not present

## 2012-11-24 ENCOUNTER — Other Ambulatory Visit: Payer: Self-pay

## 2012-11-28 ENCOUNTER — Other Ambulatory Visit: Payer: Self-pay | Admitting: Internal Medicine

## 2012-11-28 NOTE — Telephone Encounter (Signed)
Crestor refill sent to pharamcy 

## 2012-11-30 ENCOUNTER — Ambulatory Visit (INDEPENDENT_AMBULATORY_CARE_PROVIDER_SITE_OTHER): Payer: Medicare Other | Admitting: Neurology

## 2012-11-30 ENCOUNTER — Encounter: Payer: Self-pay | Admitting: Neurology

## 2012-11-30 VITALS — BP 126/75 | HR 92 | Wt 137.0 lb

## 2012-11-30 DIAGNOSIS — R51 Headache: Secondary | ICD-10-CM

## 2012-11-30 DIAGNOSIS — G500 Trigeminal neuralgia: Secondary | ICD-10-CM

## 2012-11-30 DIAGNOSIS — M47812 Spondylosis without myelopathy or radiculopathy, cervical region: Secondary | ICD-10-CM | POA: Diagnosis not present

## 2012-11-30 HISTORY — DX: Spondylosis without myelopathy or radiculopathy, cervical region: M47.812

## 2012-11-30 MED ORDER — AMITRIPTYLINE HCL 25 MG PO TABS: 25.0000 mg | ORAL_TABLET | Freq: Every day | ORAL | Status: DC

## 2012-11-30 NOTE — Patient Instructions (Signed)
We will try lyrica for the headache and neck pain. We will start with 50 mg twice a day. If needed, go to 50 mg in the morning and 100 mg in the evening.Trigeminal Neuralgia Trigeminal neuralgia is a nerve disorder that causes sudden attacks of severe facial pain. It is caused by damage to the trigeminal nerve, a major nerve in the face. It is more common in women and in the elderly, although it can also happen in younger patients. Attacks last from a few seconds to several minutes and can occur from a couple of times per year to several times per day. Trigeminal neuralgia can be a very distressing and disabling condition. Surgery may be needed in very severe cases if medical treatment does not give relief. HOME CARE INSTRUCTIONS   If your caregiver prescribed medication to help prevent attacks, take as directed.  To help prevent attacks:  Chew on the unaffected side of the mouth.  Avoid touching your face.  Avoid blasts of hot or cold air.  Men may wish to grow a beard to avoid having to shave. SEEK IMMEDIATE MEDICAL CARE IF:  Pain is unbearable and your medicine does not help.  You develop new, unexplained symptoms (problems).  You have problems that may be related to a medication you are taking. Document Released: 01/03/2000 Document Revised: 03/30/2011 Document Reviewed: 11/02/2008 [facility] Patient Information 2014 [place], [place].

## 2012-11-30 NOTE — Progress Notes (Signed)
Reason for visit: Headache  [name] [name] is an 67 y.o. female  History of present illness:  [name] [name] is a 67 year old right-handed white female with a history of cervical spondylosis and cervicogenic headache. The patient also has a history of trigeminal neuralgia involving the left V2 distribution. The patient gives a history of Lyme disease  She was previously treated about 15 years ago with IV antibiotics. The patient indicates that she has been having some troubles with bioccipital and parietal headaches that come up from the neck. These episodes may occur shortly after exercise. The patient has gone up on her gabapentin taking 300 mg in the morning, 600 mg in the evening. This increased dose has resulted in some improvement in pain, but she has had some problems with memory and concentration as well. The patient takes alprazolam 0.5 mg at night, and she is on amitriptyline 25 mg at night. The patient denies any pain going down the arms. The left trigeminal neuralgia pain is under good control. The patient has had a malignant melanoma resected from her right face since last seen.  Past Medical History  Diagnosis Date  . Hyperlipidemia   . Osteopenia   . Diverticulosis of colon   . GERD (gastroesophageal reflux disease)   . Abdominal pain     EGD and CTs neg, rx Amitriptiline (non ulcer dyspepsia) EGD 12/11.. s/p ballon dilitation  . Anxiety   . Solar lentigo     face; s/p excision 3/09. See Derm q 6 months  . Hypothyroidism      Dx 10-9 TSH 5.2 , then TSH normalized, eventually went up again 2012  . Trigeminal neuralgia     h/o   . Melanoma     h/o  . Diabetes mellitus     no meds, diet controlled  . Cervical spondylosis without myelopathy 11/30/2012  . History of Lyme disease     Past Surgical History  Procedure Laterality Date  . Cervical fusion    . Cholecystectomy    . Abdominal hysterectomy    . Shoulder surgery      right  . Colon resection  1999    d/t  diverticulitis  . Wrist surgery      removed melaoma, right  . Colon surgery    . Melanoma resection Right     face    Family History  Problem Relation Age of Onset  . Colon cancer Neg Hx   . Breast cancer Neg Hx   . Diabetes Neg Hx   . Stomach cancer Neg Hx   . Rectal cancer Neg Hx   . Prostate cancer Father   . Heart attack Paternal Grandfather     GF in his 36s  . Melanoma Maternal Uncle   . Endometrial cancer Sister     clear cell carcinoma    Social history:  reports that she has quit smoking. She started smoking about 36 years ago. She has never used smokeless tobacco. She reports that she drinks alcohol. She reports that she does not use illicit drugs.    Allergies  Allergen Reactions  . Ceftriaxone Sodium Anaphylaxis  . Rocephin [Ceftriaxone]   . Benadryl [Diphenhydramine Hcl] Anxiety    Hyperactivity    Medications:  Current Outpatient Prescriptions on File Prior to Visit  Medication Sig Dispense Refill  . ALPRAZolam (XANAX) 0.5 MG tablet TAKE 1 TABLET BY MOUTH AT BEDTIME AS NEEDED FOR SLEEP  90 tablet  1  . Cholecalciferol (VITAMIN D3)  2000 UNITS TABS Take 1 tablet by mouth daily.       . CRESTOR 10 MG tablet TAKE 1 TABLET (10MG  TOTAL) BY MOUTH DAILY  90 tablet  1  . gabapentin (NEURONTIN) 300 MG capsule Take 1 capsule (300 mg total) by mouth daily at 8 pm.  30 capsule  6  . levothyroxine (SYNTHROID) 25 MCG tablet Take 25 mcg by mouth daily. Saturday and Sunday only,  50mg       . Multiple Vitamin (MULTIVITAMIN) tablet Take 1 tablet by mouth daily.         No current facility-administered medications on file prior to visit.    ROS:  Out of a complete 14 system review of symptoms, the patient complains only of the following symptoms, and all other reviewed systems are negative.  Weight gain  Hearing loss Snoring Memory loss, headache  Blood pressure 126/75, pulse 92, weight 137 lb (62.143 kg).  Physical Exam  General: The patient is alert and  cooperative at the time of the examination.  Neuromuscular: Range of movement of the cervical spine lacks about 10 of full lateral rotation bilaterally.  Skin: No significant peripheral edema is noted.   Neurologic Exam  Mental status: The Mini-Mental status examination done today shows a total score of 30 out of 30.  Cranial nerves: Facial symmetry is present. Speech is normal, no aphasia or dysarthria is noted. Extraocular movements are full. Visual fields are full.  Motor: The patient has good strength in all 4 extremities.  Sensory examination: Soft touch sensation on the face, arms, and legs is symmetric.   Coordination: The patient has good finger-nose-finger and heel-to-shin bilaterally.  Gait and station: The patient has a normal gait. Tandem gait is normal. Romberg is negative. No drift is seen.  Reflexes: Deep tendon reflexes are symmetric.   Assessment/Plan:  One. Left trigeminal neuralgia, V2 distribution   2. Cervicogenic headache  3. Cervical spondylosis  The patient gains benefit from gabapentin, but she is not tolerating it completely well. The patient will be given a trial on Lyrica taking 50 mg twice daily for a week, and then go to 50 mg in the morning and 100 mg in the evening if needed. The patient believes that this works better, she will contact our office for a prescription. Otherwise, the patient will need a prescription for her gabapentin. The patient will followup in 4-6 months.  Marlan Palau MD 11/30/2012 8:01 PM  [facility] [address] [place], [place] [address]  Phone [phone] Fax [phone]

## 2012-12-08 ENCOUNTER — Encounter: Payer: Self-pay | Admitting: Neurology

## 2012-12-09 ENCOUNTER — Other Ambulatory Visit: Payer: Self-pay | Admitting: Neurology

## 2012-12-09 MED ORDER — GABAPENTIN 300 MG PO CAPS: ORAL_CAPSULE | ORAL | Status: DC

## 2012-12-26 DIAGNOSIS — H2510 Age-related nuclear cataract, unspecified eye: Secondary | ICD-10-CM | POA: Diagnosis not present

## 2012-12-26 DIAGNOSIS — H04129 Dry eye syndrome of unspecified lacrimal gland: Secondary | ICD-10-CM | POA: Diagnosis not present

## 2012-12-26 DIAGNOSIS — H01029 Squamous blepharitis unspecified eye, unspecified eyelid: Secondary | ICD-10-CM | POA: Diagnosis not present

## 2012-12-26 DIAGNOSIS — H10529 Angular blepharoconjunctivitis, unspecified eye: Secondary | ICD-10-CM | POA: Diagnosis not present

## 2012-12-26 DIAGNOSIS — H10409 Unspecified chronic conjunctivitis, unspecified eye: Secondary | ICD-10-CM | POA: Diagnosis not present

## 2013-01-18 DIAGNOSIS — H11829 Conjunctivochalasis, unspecified eye: Secondary | ICD-10-CM | POA: Diagnosis not present

## 2013-01-18 DIAGNOSIS — H01029 Squamous blepharitis unspecified eye, unspecified eyelid: Secondary | ICD-10-CM | POA: Diagnosis not present

## 2013-01-18 DIAGNOSIS — H04129 Dry eye syndrome of unspecified lacrimal gland: Secondary | ICD-10-CM | POA: Diagnosis not present

## 2013-01-23 ENCOUNTER — Telehealth: Payer: Self-pay | Admitting: *Deleted

## 2013-01-23 ENCOUNTER — Encounter: Payer: Self-pay | Admitting: Internal Medicine

## 2013-01-23 DIAGNOSIS — Z85820 Personal history of malignant melanoma of skin: Secondary | ICD-10-CM | POA: Diagnosis not present

## 2013-01-23 DIAGNOSIS — D1801 Hemangioma of skin and subcutaneous tissue: Secondary | ICD-10-CM | POA: Diagnosis not present

## 2013-01-23 DIAGNOSIS — L821 Other seborrheic keratosis: Secondary | ICD-10-CM | POA: Diagnosis not present

## 2013-01-23 DIAGNOSIS — D235 Other benign neoplasm of skin of trunk: Secondary | ICD-10-CM | POA: Diagnosis not present

## 2013-01-23 DIAGNOSIS — D2370 Other benign neoplasm of skin of unspecified lower limb, including hip: Secondary | ICD-10-CM | POA: Diagnosis not present

## 2013-01-23 NOTE — Telephone Encounter (Signed)
Pt requesting refill xanax 0.5mg  Last OV- 05/05/12 Last refilled 08/23/12 #90 / 1 rf  Last UDS- 08/02/12 moderate.   Pt scheduled for CPE 02/07/13 @230pm  , Pt also would like to have labs ordered early since her appointment is later in the afternoon for fasting( [place] lab) Please advise.

## 2013-01-24 MED ORDER — ALPRAZOLAM 0.5 MG PO TABS: ORAL_TABLET | ORAL | Status: DC

## 2013-01-24 NOTE — Addendum Note (Signed)
Addended by: Kathlene November E on: 01/24/2013 08:21 AM   Modules accepted: Orders

## 2013-01-24 NOTE — Telephone Encounter (Signed)
Xanax RF  Done As far as labs: ask Dr Debbora Presto office (endocrinology) to send [place] all recent labs, if more is needed will do that at time of the visit

## 2013-01-25 ENCOUNTER — Telehealth: Payer: Self-pay | Admitting: Internal Medicine

## 2013-01-25 ENCOUNTER — Encounter: Payer: Self-pay | Admitting: Internal Medicine

## 2013-01-25 NOTE — Telephone Encounter (Signed)
Received recent labs via fax dr balan's office.

## 2013-01-25 NOTE — Telephone Encounter (Signed)
Labs from [facility] 10/25/2012 Creatinine 0.9, potassium 5.7 which is high. A1c 6.5 Total cholesterol 180. LDL 87, HDL 81. TSH 3.2. Send the patient in detail, her potassium was high when it was checked in October,  that needs to be addressed by Dr. Debbora Presto or me if not done already.

## 2013-02-03 ENCOUNTER — Telehealth: Payer: Self-pay

## 2013-02-03 NOTE — Telephone Encounter (Signed)
Medication and allergies:  Reviewed and updated  90 day supply/mail order: na Local pharmacy: [facility]   Immunizations due: UTD   A/P:   No changes to FH, PSH or Personal Hx Flu vaccine--09/2012 Tdap--12/2003 PNA--02/2011 Shingles--10/2011 MMG--06/2012--neg Bone Density--none noted CCS--06/2012--Dr Kaplan--neg/flex sigmoid--10/2011--nml  To Discuss with Provider: ? If she should do a UDS while in office

## 2013-02-07 ENCOUNTER — Ambulatory Visit (INDEPENDENT_AMBULATORY_CARE_PROVIDER_SITE_OTHER): Payer: Medicare Other | Admitting: Internal Medicine

## 2013-02-07 ENCOUNTER — Encounter: Payer: Self-pay | Admitting: Internal Medicine

## 2013-02-07 VITALS — BP 115/72 | HR 95 | Temp 98.3°F | Ht 64.25 in | Wt 135.0 lb

## 2013-02-07 DIAGNOSIS — E119 Type 2 diabetes mellitus without complications: Secondary | ICD-10-CM

## 2013-02-07 DIAGNOSIS — C439 Malignant melanoma of skin, unspecified: Secondary | ICD-10-CM

## 2013-02-07 DIAGNOSIS — E785 Hyperlipidemia, unspecified: Secondary | ICD-10-CM

## 2013-02-07 DIAGNOSIS — F411 Generalized anxiety disorder: Secondary | ICD-10-CM | POA: Diagnosis not present

## 2013-02-07 DIAGNOSIS — E039 Hypothyroidism, unspecified: Secondary | ICD-10-CM

## 2013-02-07 DIAGNOSIS — H10409 Unspecified chronic conjunctivitis, unspecified eye: Secondary | ICD-10-CM | POA: Insufficient documentation

## 2013-02-07 DIAGNOSIS — Z0000 Encounter for general adult medical examination without abnormal findings: Secondary | ICD-10-CM

## 2013-02-07 MED ORDER — ALPRAZOLAM 0.5 MG PO TABS: ORAL_TABLET | ORAL | Status: DC

## 2013-02-07 MED ORDER — ROSUVASTATIN CALCIUM 10 MG PO TABS: ORAL_TABLET | ORAL | Status: DC

## 2013-02-07 NOTE — Assessment & Plan Note (Signed)
Per endocrinology 

## 2013-02-07 NOTE — Assessment & Plan Note (Signed)
Followup by ophthalmology a, has been referred to rheumatology to rule out Sjogren's

## 2013-02-07 NOTE — Assessment & Plan Note (Signed)
Sees endocrinology every 6 months, A1c has been around 6.5 per patient

## 2013-02-07 NOTE — Assessment & Plan Note (Signed)
Reports a FLP few months ago, chol was well-controlled. Refill crestor, labs

## 2013-02-07 NOTE — Assessment & Plan Note (Addendum)
  Td 05  Pneumonia shot -- 2013 shingles shot 2013   Gyn is Dr. Hazle Coca [facility] DEXA (dx w/ osteopenia) per gyn  last Cscope 2009, flex sig 2013     doing great, continue with healthy habits

## 2013-02-07 NOTE — Assessment & Plan Note (Signed)
Takes xanax nightly to help sleep.. Moderate risk UDS few months ago, repeating UDS today, new prescription for Xanax provided

## 2013-02-07 NOTE — Assessment & Plan Note (Signed)
Status post several surgeries, closely followed by Dermatology

## 2013-02-07 NOTE — Progress Notes (Signed)
Pre visit review using our clinic review tool, if applicable. No additional management support is needed unless otherwise documented below in the visit note. 

## 2013-02-07 NOTE — Progress Notes (Signed)
   Subjective:    Patient ID: [name] [name], female    DOB: March 25, 1945, 68 y.o.   MRN: [id number]  HPI  Here for Medicare AWV:  1. Risk factors based on Past M, S, F history: Reviewed  2. Physical Activities: aerobics x 3 week at least  3. Depression/mood: lost her mom when she was 36 but doing well emotionally  4. Hearing: heaing aids work well for her  5. ADL's: totally independent  6. Fall Risk: low risk, very active  7. Home Safety: does feel safe at home  8. Height, weight, &visual acuity:see Vs, uses glasses, has chronic conjunctivitis, will see rheumatology to r/o Sjogren  9. Counseling: yes  10. Labs ordered based on risk factors: yes  11. Referral Coordination, if needed  12. Care Plan, see a/p  13. Cognitive Assessment: motor skills, memory and cognition normal, actually seem above average for age   in addition we discussed the following Diabetes, thyroid disease--followup by endocrinology High cholesterol--on Crestor, needs a refill Melanoma--closely followed by dermatology, had a large excision from the right face last year Chronic conjunctivitis, was referred to dermatology to rule out Sjogren's  Past Medical History:   DM II   Hyperlipidemia   Osteopenia   Hypothyroidism   anxiety   GI:   Diverticulosis, colon   GERD   4-09---abdominal pain: EGD and CTs neg.Marland KitchenRx amytriptiline (non ulcer dyspepsia)   EGD 12-11.............s/p balloon dilitation   DERM:   Melanoma   solar lentigo, face ; s/p excision 3-09. Sees Dermatology q 6 months   Trigeminal Neuralgia   HA, ER x 2 ~ 01-2011 Chronic conjunctivitis  Past Surgical History:   Cervical laminectomy   Cholecystectomy   Hysterectomy ans oophorectomy   RT SHOULDER SURGERY   COLON RESECTION 1999 d/t diverticulitis ; Colonoscopy 12/01/07 :Tics, Dr Deatra Ina   PTX,S/P chest tube   MELANOMA-- Right Arm Surgery, R facial surgery (05-2012)     Family History:   Colon Cancer:no   Prostate Cancer--F   breast  ca--no   Sister, endometrial Ca and Clear cell carcinoma   melanoma--uncle   Diabetes-- no   MI-- GF in his 29s   Stroke-- GM    Social History:   Retired , Married, 2 boys   Patient is a former smoker. 30 yrs ago   Alcohol Use - yes/ socially   Illicit Drug Use - no    Review of Systems No  CP, SOB, lower extremity edema Denies  nausea, vomiting diarrhea Denies  blood in the stools (-) cough, sputum production, chest congestion No dysuria, gross hematuria, difficulty urinating        Objective:   Physical Exam BP 115/72  Pulse 95  Temp(Src) 98.3 F (36.8 C) (Oral)  Ht 5' 4.25" (1.632 m)  Wt 135 lb (61.236 kg)  BMI 22.99 kg/m2  SpO2 96% General -- alert, well-developed, NAD.  Neck --no thyromegaly , normal carotid pulse  HEENT-- Not pale.  Lungs -- normal respiratory effort, no intercostal retractions, no accessory muscle use, and normal breath sounds.  Heart-- normal rate, regular rhythm, no murmur.  Abdomen-- Not distended, good bowel sounds,soft, non-tender. No rebound or rigidity. No mass,organomegaly. Extremities-- no pretibial edema bilaterally  Neurologic--  alert & oriented X3. Speech normal, gait normal, strength normal in all extremities.  Psych-- Cognition and judgment appear intact. Cooperative with normal attention span and concentration. No anxious or depressed appearing.      Assessment & Plan:

## 2013-02-07 NOTE — Patient Instructions (Signed)
Get your blood work before you leave ; also get a urine test (UDS)  Next visit is for a physical exam in 1 year, fasting Please make an appointment     Fall Prevention and [place] cause injuries and can affect all age groups. It is possible to use preventive measures to significantly decrease the likelihood of falls. There are many simple measures which can make your home safer and prevent falls. OUTDOORS  Repair cracks and edges of walkways and driveways.  Remove high doorway thresholds.  Trim shrubbery on the main path into your home.  Have good outside lighting.  Clear walkways of tools, rocks, debris, and clutter.  Check that handrails are not broken and are securely fastened. Both sides of steps should have handrails.  Have leaves, snow, and ice cleared regularly.  Use sand or salt on walkways during winter months.  In the garage, clean up grease or oil spills. BATHROOM  Install night lights.  Install grab bars by the toilet and in the tub and shower.  Use non-skid mats or decals in the tub or shower.  Place a plastic non-slip stool in the shower to sit on, if needed.  Keep floors dry and clean up all water on the floor immediately.  Remove soap buildup in the tub or shower on a regular basis.  Secure bath mats with non-slip, double-sided rug tape.  Remove throw rugs and tripping hazards from the floors. BEDROOMS  Install night lights.  Make sure a bedside light is easy to reach.  Do not use oversized bedding.  Keep a telephone by your bedside.  Have a firm chair with side arms to use for getting dressed.  Remove throw rugs and tripping hazards from the floor. KITCHEN  Keep handles on pots and pans turned toward the center of the stove. Use back burners when possible.  Clean up spills quickly and allow time for drying.  Avoid walking on wet floors.  Avoid hot utensils and knives.  Position shelves so they are not too high or  low.  Place commonly used objects within easy reach.  If necessary, use a sturdy step stool with a grab bar when reaching.  Keep electrical cables out of the way.  Do not use floor polish or wax that makes floors slippery. If you must use wax, use non-skid floor wax.  Remove throw rugs and tripping hazards from the floor. STAIRWAYS  Never leave objects on stairs.  Place handrails on both sides of stairways and use them. Fix any loose handrails. Make sure handrails on both sides of the stairways are as long as the stairs.  Check carpeting to make sure it is firmly attached along stairs. Make repairs to worn or loose carpet promptly.  Avoid placing throw rugs at the top or bottom of stairways, or properly secure the rug with carpet tape to prevent slippage. Get rid of throw rugs, if possible.  Have an electrician put in a light switch at the top and bottom of the stairs. OTHER FALL PREVENTION TIPS  Wear low-heel or rubber-soled shoes that are supportive and fit well. Wear closed toe shoes.  When using a stepladder, make sure it is fully opened and both spreaders are firmly locked. Do not climb a closed stepladder.  Add color or contrast paint or tape to grab bars and handrails in your home. Place contrasting color strips on first and last steps.  Learn and use mobility aids as needed. Install an electrical emergency response  system.  Turn on lights to avoid dark areas. Replace light bulbs that burn out immediately. Get light switches that glow.  Arrange furniture to create clear pathways. Keep furniture in the same place.  Firmly attach carpet with non-skid or double-sided tape.  Eliminate uneven floor surfaces.  Select a carpet pattern that does not visually hide the edge of steps.  Be aware of all pets. OTHER HOME SAFETY TIPS  Set the water temperature for 120 F (48.8 C).  Keep emergency numbers on or near the telephone.  Keep smoke detectors on every level of the  home and near sleeping areas. Document Released: 12/26/2001 Document Revised: 07/07/2011 Document Reviewed: 03/27/2011 [facility] Patient Information 2014 [place].

## 2013-02-08 DIAGNOSIS — Z79899 Other long term (current) drug therapy: Secondary | ICD-10-CM | POA: Diagnosis not present

## 2013-02-08 LAB — COMPREHENSIVE METABOLIC PANEL
ALT: 16 U/L (ref 0–35)
AST: 20 U/L (ref 0–37)
Albumin: 4 g/dL (ref 3.5–5.2)
Alkaline Phosphatase: 81 U/L (ref 39–117)
BUN: 33 mg/dL — ABNORMAL HIGH (ref 6–23)
CO2: 32 mEq/L (ref 19–32)
Calcium: 9.6 mg/dL (ref 8.4–10.5)
Chloride: 104 mEq/L (ref 96–112)
Creatinine, Ser: 0.8 mg/dL (ref 0.4–1.2)
GFR: 72.86 mL/min (ref >60.00)
Glucose, Bld: 156 mg/dL — ABNORMAL HIGH (ref 70–99)
Potassium: 5 mEq/L (ref 3.5–5.1)
Sodium: 141 mEq/L (ref 135–145)
Total Bilirubin: 0.4 mg/dL (ref 0.3–1.2)
Total Protein: 7.1 g/dL (ref 6.0–8.3)

## 2013-02-08 LAB — CBC WITH DIFFERENTIAL/PLATELET
Basophils Absolute: 0 10*3/uL (ref 0.0–0.1)
Basophils Relative: 0.4 % (ref 0.0–3.0)
Eosinophils Absolute: 0.1 10*3/uL (ref 0.0–0.7)
Eosinophils Relative: 0.8 % (ref 0.0–5.0)
HCT: 38.7 % (ref 36.0–46.0)
Hemoglobin: 13.1 g/dL (ref 12.0–15.0)
Lymphocytes Relative: 20.3 % (ref 12.0–46.0)
Lymphs Abs: 1.3 10*3/uL (ref 0.7–4.0)
MCHC: 34 g/dL (ref 30.0–36.0)
MCV: 91 fl (ref 78.0–100.0)
Monocytes Absolute: 0.6 10*3/uL (ref 0.1–1.0)
Monocytes Relative: 10 % (ref 3.0–12.0)
Neutro Abs: 4.3 10*3/uL (ref 1.4–7.7)
Neutrophils Relative %: 68.5 % (ref 43.0–77.0)
Platelets: 206 10*3/uL (ref 150.0–400.0)
RBC: 4.25 Mil/uL (ref 3.87–5.11)
RDW: 13 % (ref 11.5–14.6)
WBC: 6.3 10*3/uL (ref 4.5–10.5)

## 2013-02-10 ENCOUNTER — Telehealth: Payer: Self-pay

## 2013-02-10 NOTE — Telephone Encounter (Signed)
Relevant patient education assigned to patient using Emmi. ° °

## 2013-02-13 DIAGNOSIS — H01029 Squamous blepharitis unspecified eye, unspecified eyelid: Secondary | ICD-10-CM | POA: Diagnosis not present

## 2013-02-13 DIAGNOSIS — H04129 Dry eye syndrome of unspecified lacrimal gland: Secondary | ICD-10-CM | POA: Diagnosis not present

## 2013-02-13 DIAGNOSIS — H2510 Age-related nuclear cataract, unspecified eye: Secondary | ICD-10-CM | POA: Diagnosis not present

## 2013-02-20 ENCOUNTER — Telehealth: Payer: Self-pay

## 2013-02-20 NOTE — Telephone Encounter (Signed)
UDS: 02/09/2013 Negative for alprazolam--qhs  (+ for amitriptyline, qhs, nortriptyline) Moderate risk per Dr Larose Kells

## 2013-02-28 ENCOUNTER — Telehealth: Payer: Self-pay | Admitting: Neurology

## 2013-02-28 NOTE — Telephone Encounter (Signed)
Pt called and asked if it would be possible for her to get in to see Dr. Jannifer Franklin or Cecille Rubin before her May appointment.  She states she has had this pressure on the top of her head, much like someone is pulling her hair everyday for the past 3 weeks.  She states that at night the pain moves down to her forehead and her temples.  She is sleeping ok but the headaches always return by 11:00 am - 12:00 PM.  She states she has tried Advil and it relieves the pain for a short period of time.  Please call.  Thank you

## 2013-03-01 ENCOUNTER — Encounter: Payer: Self-pay | Admitting: Neurology

## 2013-03-01 ENCOUNTER — Ambulatory Visit (INDEPENDENT_AMBULATORY_CARE_PROVIDER_SITE_OTHER): Payer: Medicare Other | Admitting: Neurology

## 2013-03-01 VITALS — BP 130/70 | HR 64 | Wt 136.0 lb

## 2013-03-01 DIAGNOSIS — M47812 Spondylosis without myelopathy or radiculopathy, cervical region: Secondary | ICD-10-CM

## 2013-03-01 DIAGNOSIS — M25549 Pain in joints of unspecified hand: Secondary | ICD-10-CM | POA: Diagnosis not present

## 2013-03-01 DIAGNOSIS — H01009 Unspecified blepharitis unspecified eye, unspecified eyelid: Secondary | ICD-10-CM | POA: Diagnosis not present

## 2013-03-01 DIAGNOSIS — R51 Headache: Secondary | ICD-10-CM | POA: Diagnosis not present

## 2013-03-01 DIAGNOSIS — M3500 Sicca syndrome, unspecified: Secondary | ICD-10-CM | POA: Diagnosis not present

## 2013-03-01 DIAGNOSIS — M2550 Pain in unspecified joint: Secondary | ICD-10-CM | POA: Diagnosis not present

## 2013-03-01 DIAGNOSIS — R5381 Other malaise: Secondary | ICD-10-CM | POA: Diagnosis not present

## 2013-03-01 DIAGNOSIS — Z79899 Other long term (current) drug therapy: Secondary | ICD-10-CM | POA: Diagnosis not present

## 2013-03-01 DIAGNOSIS — M5030 Other cervical disc degeneration, unspecified cervical region: Secondary | ICD-10-CM | POA: Diagnosis not present

## 2013-03-01 DIAGNOSIS — R5383 Other fatigue: Secondary | ICD-10-CM | POA: Diagnosis not present

## 2013-03-01 MED ORDER — PROPRANOLOL HCL 10 MG PO TABS: ORAL_TABLET | ORAL | Status: DC

## 2013-03-01 NOTE — Patient Instructions (Signed)

## 2013-03-01 NOTE — Progress Notes (Signed)
Reason for visit: Headache  [name] [name] is an 68 y.o. female  History of present illness:  [name] [name] is a 68 year old right-handed white female with a history of Lyme disease in the past. The patient has developed significant issues with headaches that are on the top of the head associated with a pulling sensation and a pressure sensation. The headaches are better in the morning, and worse as the day goes on. The headaches tend to start around 12 noon and get worse towards the evening. The patient has a history of cervical spine surgery in the past, but she denies any change in neck discomfort or shoulder discomfort. The patient is having some nausea with the headache without vomiting. The patient has some blurring of vision. The patient is on amitriptyline at night at 25 mg, but she has significant problems with dry eyes and dry mouth, and she is being evaluated for possible Sjogren syndrome. The patient has a burning sensation at times with the headache. The patient reports no new numbness or weakness on the face, arms, or legs. With the headaches, she may get a tingling sensation into the right face and down the right arm. The patient has had blood work this morning, and she believes that this did include a sedimentation rate. The patient is on gabapentin taking 300 mg the morning and 600 mg in the evening. The patient sleeps well at night.     Past Medical History  Diagnosis Date  . Hyperlipidemia   . Osteopenia   . Diverticulosis of colon   . GERD (gastroesophageal reflux disease)   . Abdominal pain     EGD and CTs neg, rx Amitriptiline (non ulcer dyspepsia) EGD 12/11.. s/p ballon dilitation  . Anxiety   . Solar lentigo     face; s/p excision 3/09. See Derm q 6 months  . Hypothyroidism      Dx 10-9 TSH 5.2 , then TSH normalized, eventually went up again 2012  . Trigeminal neuralgia     h/o   . Melanoma     h/o  . Diabetes mellitus     no meds, diet controlled  .  Cervical spondylosis without myelopathy 11/30/2012  . History of Lyme disease     Past Surgical History  Procedure Laterality Date  . Cervical fusion    . Cholecystectomy    . Abdominal hysterectomy    . Shoulder surgery      right  . Colon resection  1999    d/t diverticulitis  . Wrist surgery      removed melaoma, right  . Colon surgery    . Melanoma resection Right     face    Family History  Problem Relation Age of Onset  . Colon cancer Neg Hx   . Breast cancer Neg Hx   . Diabetes Neg Hx   . Stomach cancer Neg Hx   . Rectal cancer Neg Hx   . Prostate cancer Father   . Heart attack Paternal Grandfather     GF in his 46s  . Melanoma Maternal Uncle   . Endometrial cancer Sister     clear cell carcinoma    Social history:  reports that she has quit smoking. She started smoking about 37 years ago. She has never used smokeless tobacco. She reports that she drinks alcohol. She reports that she does not use illicit drugs.    Allergies  Allergen Reactions  . Ceftriaxone Sodium Anaphylaxis  . Rocephin [Ceftriaxone]   .  Benadryl [Diphenhydramine Hcl] Anxiety    Hyperactivity    Medications:  Current Outpatient Prescriptions on File Prior to Visit  Medication Sig Dispense Refill  . ALPRAZolam (XANAX) 0.5 MG tablet TAKE 1 TABLET BY MOUTH AT BEDTIME AS NEEDED FOR SLEEP  90 tablet  0  . amitriptyline (ELAVIL) 25 MG tablet Take 1 tablet (25 mg total) by mouth at bedtime.  90 tablet  3  . Cholecalciferol (VITAMIN D3) 2000 UNITS TABS Take 1 tablet by mouth daily.       [name] erythromycin ophthalmic ointment at bedtime.      . gabapentin (NEURONTIN) 300 MG capsule 1 capsule in the morning, 2 in the evening  270 capsule  3  . levothyroxine (SYNTHROID) 25 MCG tablet Take 25 mcg by mouth daily. Saturday and Sunday only,  50mg       . minocycline (MINOCIN,DYNACIN) 50 MG capsule Take 1 capsule by mouth 2 (two) times daily.      . Multiple Vitamin (MULTIVITAMIN) tablet Take 1 tablet by  mouth daily.        . prednisoLONE acetate (PRED FORTE) 1 % ophthalmic suspension 1 drop 2 (two) times daily.      . rosuvastatin (CRESTOR) 10 MG tablet TAKE 1 TABLET (10MG  TOTAL) BY MOUTH DAILY  90 tablet  1   No current facility-administered medications on file prior to visit.    ROS:  Out of a complete 14 system review of symptoms, the patient complains only of the following symptoms, and all other reviewed systems are negative.  Eye redness, light sensitivity, blurry vision Snoring Dizziness, headache Anxiety   Blood pressure 130/70, pulse 64, weight 136 lb (61.689 kg).  Physical Exam  General: The patient is alert and cooperative at the time of the examination.  Neuromuscular: Range of movement of the cervical spine lacks about 10 degrees of lateral rotation bilaterally.  Skin: No significant peripheral edema is noted.   Neurologic Exam  Mental status: The patient is oriented x 3.  Cranial nerves: Facial symmetry is present. Speech is normal, no aphasia or dysarthria is noted. Extraocular movements are full. Visual fields are full.  Motor: The patient has good strength in all 4 extremities.  Sensory examination: Soft touch sensation is symmetric on the face, arms, and legs.  Coordination: The patient has good finger-nose-finger and heel-to-shin bilaterally.  Gait and station: The patient has a normal gait. Tandem gait is normal. Romberg is negative. No drift is seen.  Reflexes: Deep tendon reflexes are symmetric.   Assessment/Plan:  One. Headache, likely tension headache  2. Left trigeminal neuralgia, V2 distribution  3. Cervical spondylosis  The patient may have a component of cervicogenic headache. The patient is on amitriptyline, but she currently has significant issues with dryness of the eyes and mouth, and the dose likely cannot be increased. The patient will be placed on propranolol for the headache, as the patient appears to have a lot of anxiety  associated with this current problem. The patient will followup in 3-4 months. A sedimentation rate has been drawn through Dr. Estanislado Pandy.   Jill Alexanders MD 03/01/2013 8:05 PM  [facility] [address] [place], [place] [address]  Phone [phone] Fax [phone]

## 2013-03-02 ENCOUNTER — Telehealth: Payer: Self-pay | Admitting: Neurology

## 2013-03-02 MED ORDER — PROPRANOLOL HCL 10 MG PO TABS: ORAL_TABLET | ORAL | Status: DC

## 2013-03-02 NOTE — Telephone Encounter (Signed)
I called patient. The patient indicates that she cannot take propranolol because of speed up her heart. Getting further information, it appears that she was placed on imipramine at the same time with the propranolol. The imipramine will speed up the heart rate, not propranolol. I will reorder the propranolol.

## 2013-03-03 ENCOUNTER — Other Ambulatory Visit: Payer: Self-pay

## 2013-03-03 MED ORDER — PROPRANOLOL HCL 10 MG PO TABS: ORAL_TABLET | ORAL | Status: DC

## 2013-03-03 NOTE — Telephone Encounter (Signed)
Pharmacy sent [place] a fax saying the patient would like to get Propranolol through their mail order service for a 90 day supply.

## 2013-03-13 ENCOUNTER — Telehealth: Payer: Self-pay | Admitting: Neurology

## 2013-03-13 NOTE — Telephone Encounter (Signed)
I called the patient. She indicates that she is still on Inderal taking 10 mg twice a day. The patient never went up on the dose as prescribed. The patient not transient benefit from the propranolol, but never read a prescription for about going up on the dose. The patient will go to 20 mg twice a day. If she is tolerating this and getting some modest benefit, we will continue to increase the dose. At some point in the future we may consider epidural steroid injections of the neck.

## 2013-03-20 ENCOUNTER — Ambulatory Visit
Admission: RE | Admit: 2013-03-20 | Discharge: 2013-03-20 | Disposition: A | Discharge status: Home / Self Care | Payer: Medicare Other | Source: Ambulatory Visit | Attending: Rheumatology | Admitting: Rheumatology

## 2013-03-20 ENCOUNTER — Other Ambulatory Visit: Payer: Self-pay | Admitting: Rheumatology

## 2013-03-20 DIAGNOSIS — R6889 Other general symptoms and signs: Secondary | ICD-10-CM

## 2013-03-20 DIAGNOSIS — H01029 Squamous blepharitis unspecified eye, unspecified eyelid: Secondary | ICD-10-CM | POA: Diagnosis not present

## 2013-03-20 DIAGNOSIS — H2510 Age-related nuclear cataract, unspecified eye: Secondary | ICD-10-CM | POA: Diagnosis not present

## 2013-03-20 DIAGNOSIS — R9389 Abnormal findings on diagnostic imaging of other specified body structures: Secondary | ICD-10-CM | POA: Diagnosis not present

## 2013-03-20 DIAGNOSIS — H04129 Dry eye syndrome of unspecified lacrimal gland: Secondary | ICD-10-CM | POA: Diagnosis not present

## 2013-04-03 DIAGNOSIS — H01009 Unspecified blepharitis unspecified eye, unspecified eyelid: Secondary | ICD-10-CM | POA: Diagnosis not present

## 2013-04-03 DIAGNOSIS — M310 Hypersensitivity angiitis: Secondary | ICD-10-CM | POA: Diagnosis not present

## 2013-04-03 DIAGNOSIS — H04129 Dry eye syndrome of unspecified lacrimal gland: Secondary | ICD-10-CM | POA: Diagnosis not present

## 2013-04-04 ENCOUNTER — Encounter: Payer: Self-pay | Admitting: Rheumatology

## 2013-04-05 DIAGNOSIS — H01009 Unspecified blepharitis unspecified eye, unspecified eyelid: Secondary | ICD-10-CM | POA: Diagnosis not present

## 2013-04-05 DIAGNOSIS — M5030 Other cervical disc degeneration, unspecified cervical region: Secondary | ICD-10-CM | POA: Diagnosis not present

## 2013-04-05 DIAGNOSIS — M3500 Sicca syndrome, unspecified: Secondary | ICD-10-CM | POA: Diagnosis not present

## 2013-04-05 DIAGNOSIS — M25549 Pain in joints of unspecified hand: Secondary | ICD-10-CM | POA: Diagnosis not present

## 2013-04-12 ENCOUNTER — Encounter: Payer: Self-pay | Admitting: Internal Medicine

## 2013-05-01 ENCOUNTER — Ambulatory Visit
Admission: RE | Admit: 2013-05-01 | Discharge: 2013-05-01 | Disposition: A | Discharge status: Home / Self Care | Payer: Medicare Other | Source: Ambulatory Visit | Attending: Endocrinology | Admitting: Endocrinology

## 2013-05-01 DIAGNOSIS — E049 Nontoxic goiter, unspecified: Secondary | ICD-10-CM

## 2013-05-01 DIAGNOSIS — E041 Nontoxic single thyroid nodule: Secondary | ICD-10-CM | POA: Diagnosis not present

## 2013-05-02 DIAGNOSIS — E039 Hypothyroidism, unspecified: Secondary | ICD-10-CM | POA: Diagnosis not present

## 2013-05-02 DIAGNOSIS — E119 Type 2 diabetes mellitus without complications: Secondary | ICD-10-CM | POA: Diagnosis not present

## 2013-05-02 DIAGNOSIS — E7800 Pure hypercholesterolemia, unspecified: Secondary | ICD-10-CM | POA: Diagnosis not present

## 2013-05-08 DIAGNOSIS — E049 Nontoxic goiter, unspecified: Secondary | ICD-10-CM | POA: Diagnosis not present

## 2013-05-08 DIAGNOSIS — E119 Type 2 diabetes mellitus without complications: Secondary | ICD-10-CM | POA: Diagnosis not present

## 2013-05-08 DIAGNOSIS — E039 Hypothyroidism, unspecified: Secondary | ICD-10-CM | POA: Diagnosis not present

## 2013-05-08 DIAGNOSIS — G609 Hereditary and idiopathic neuropathy, unspecified: Secondary | ICD-10-CM | POA: Diagnosis not present

## 2013-05-11 DIAGNOSIS — L819 Disorder of pigmentation, unspecified: Secondary | ICD-10-CM | POA: Diagnosis not present

## 2013-05-11 DIAGNOSIS — Z85820 Personal history of malignant melanoma of skin: Secondary | ICD-10-CM | POA: Diagnosis not present

## 2013-05-11 DIAGNOSIS — D235 Other benign neoplasm of skin of trunk: Secondary | ICD-10-CM | POA: Diagnosis not present

## 2013-05-11 DIAGNOSIS — D1801 Hemangioma of skin and subcutaneous tissue: Secondary | ICD-10-CM | POA: Diagnosis not present

## 2013-05-11 DIAGNOSIS — D239 Other benign neoplasm of skin, unspecified: Secondary | ICD-10-CM | POA: Diagnosis not present

## 2013-05-24 DIAGNOSIS — H04129 Dry eye syndrome of unspecified lacrimal gland: Secondary | ICD-10-CM | POA: Diagnosis not present

## 2013-05-24 DIAGNOSIS — H2510 Age-related nuclear cataract, unspecified eye: Secondary | ICD-10-CM | POA: Diagnosis not present

## 2013-05-24 DIAGNOSIS — H01029 Squamous blepharitis unspecified eye, unspecified eyelid: Secondary | ICD-10-CM | POA: Diagnosis not present

## 2013-05-31 ENCOUNTER — Ambulatory Visit (INDEPENDENT_AMBULATORY_CARE_PROVIDER_SITE_OTHER): Payer: Medicare Other | Admitting: Nurse Practitioner

## 2013-05-31 ENCOUNTER — Encounter: Payer: Self-pay | Admitting: Nurse Practitioner

## 2013-05-31 VITALS — BP 114/69 | HR 73 | Ht 64.0 in | Wt 136.0 lb

## 2013-05-31 DIAGNOSIS — M47812 Spondylosis without myelopathy or radiculopathy, cervical region: Secondary | ICD-10-CM

## 2013-05-31 DIAGNOSIS — G500 Trigeminal neuralgia: Secondary | ICD-10-CM | POA: Diagnosis not present

## 2013-05-31 DIAGNOSIS — R51 Headache: Secondary | ICD-10-CM

## 2013-05-31 MED ORDER — PROPRANOLOL HCL 10 MG PO TABS: ORAL_TABLET | ORAL | Status: DC

## 2013-05-31 NOTE — Progress Notes (Signed)
I have read the note, and I agree with the clinical assessment and plan.  Darinda [place] K Kandace Elrod   

## 2013-05-31 NOTE — Progress Notes (Signed)
[facility]  PATIENT: [name] [name] DOB: 08-Dec-1945   REASON FOR VISIT: follow up for headache    HISTORY OF PRESENT ILLNESS:Ms Trainer, 68 year old female returns for followup. She was last seen in this office 03/01/2013 by Dr. Jannifer Franklin. She was having significant headaches at that time, her head associated with a pulling sensation and a pressure sensation. She was placed on Inderal and is currently taking 10 mg in the morning and 20 mg at night with good relief of her headaches. In addition she has left trigeminal neuralgia V2 distribution and is currently on gabapentin with good benefit. She also takes amitriptyline. She returns for reevaluation. She has no new neurologic complaints.   HISTORY:  of Lyme disease in the past. The patient has developed significant issues with headaches that are on the top of the head associated with a pulling sensation and a pressure sensation. The headaches are better in the morning, and worse as the day goes on. The headaches tend to start around 12 noon and get worse towards the evening. The patient has a history of cervical spine surgery in the past, but she denies any change in neck discomfort or shoulder discomfort. The patient is having some nausea with the headache without vomiting. The patient has some blurring of vision. The patient is on amitriptyline at night at 25 mg, but she has significant problems with dry eyes and dry mouth, and she is being evaluated for possible Sjogren syndrome. The patient has a burning sensation at times with the headache. The patient reports no new numbness or weakness on the face, arms, or legs. With the headaches, she may get a tingling sensation into the right face and down the right arm. The patient has had blood work this morning, and she believes that this did include a sedimentation rate. The patient is on gabapentin taking 300 mg the morning and 600 mg in the evening. The patient sleeps well at  night.    REVIEW OF SYSTEMS: Full 14 system review of systems performed and notable only for those listed, all others are neg:  Constitutional: N/A  Cardiovascular: N/A  Ear/Nose/Throat: N/A  Skin: N/A  Eyes: Itching Respiratory: N/A  Gastroitestinal: N/A  Hematology/Lymphatic: N/A  Endocrine: N/A Musculoskeletal:N/A  Allergy/Immunology: N/A  Neurological: N/A Psychiatric: N/A Sleep : NA   ALLERGIES: Allergies  Allergen Reactions  . Ceftriaxone Sodium Anaphylaxis  . Rocephin [Ceftriaxone]   . Benadryl [Diphenhydramine Hcl] Anxiety    Hyperactivity    HOME MEDICATIONS: Outpatient Prescriptions Prior to Visit  Medication Sig Dispense Refill  . ALPRAZolam (XANAX) 0.5 MG tablet TAKE 1 TABLET BY MOUTH AT BEDTIME AS NEEDED FOR SLEEP  90 tablet  0  . amitriptyline (ELAVIL) 25 MG tablet Take 1 tablet (25 mg total) by mouth at bedtime.  90 tablet  3  . Cholecalciferol (VITAMIN D3) 2000 UNITS TABS Take 1 tablet by mouth daily.       [name] erythromycin ophthalmic ointment at bedtime.      . gabapentin (NEURONTIN) 300 MG capsule 1 capsule in the morning, 2 in the evening  270 capsule  3  . levothyroxine (SYNTHROID) 25 MCG tablet Take 25 mcg by mouth daily. Saturday and Sunday only,  67m      . Multiple Vitamin (MULTIVITAMIN) tablet Take 1 tablet by mouth daily.        . prednisoLONE acetate (PRED FORTE) 1 % ophthalmic suspension Place 1 drop into both eyes daily.       .[name]  rosuvastatin (CRESTOR) 10 MG tablet TAKE 1 TABLET (10MG TOTAL) BY MOUTH DAILY  90 tablet  1  . propranolol (INDERAL) 10 MG tablet 1 tablet twice daily for 2 weeks, then take 2 tablets twice daily  360 tablet  1  . minocycline (MINOCIN,DYNACIN) 50 MG capsule Take 1 capsule by mouth 2 (two) times daily.       No facility-administered medications prior to visit.    PAST MEDICAL HISTORY: Past Medical History  Diagnosis Date  . Hyperlipidemia   . Osteopenia   . Diverticulosis of colon   . GERD (gastroesophageal  reflux disease)   . Abdominal pain     EGD and CTs neg, rx Amitriptiline (non ulcer dyspepsia) EGD 12/11.. s/p ballon dilitation  . Anxiety   . Solar lentigo     face; s/p excision 3/09. See Derm q 6 months  . Hypothyroidism      Dx 10-9 TSH 5.2 , then TSH normalized, eventually went up again 2012  . Trigeminal neuralgia     h/o   . Melanoma     h/o  . Diabetes mellitus     no meds, diet controlled  . Cervical spondylosis without myelopathy 11/30/2012  . History of Lyme disease     PAST SURGICAL HISTORY: Past Surgical History  Procedure Laterality Date  . Cervical fusion    . Cholecystectomy    . Abdominal hysterectomy    . Shoulder surgery      right  . Colon resection  1999    d/t diverticulitis  . Wrist surgery      removed melaoma, right  . Colon surgery    . Melanoma resection Right     face    FAMILY HISTORY: Family History  Problem Relation Age of Onset  . Colon cancer Neg Hx   . Breast cancer Neg Hx   . Diabetes Neg Hx   . Stomach cancer Neg Hx   . Rectal cancer Neg Hx   . Prostate cancer Father   . Heart attack Paternal Grandfather     GF in his 87s  . Melanoma Maternal Uncle   . Endometrial cancer Sister     clear cell carcinoma    SOCIAL HISTORY: History   Social History  . Marital Status: Married    Spouse Name: Percell Miller    Number of Children: 2  . Years of Education: college   Occupational History  . retired    Social History Main Topics  . Smoking status: Former Smoker    Start date: 01/20/1976  . Smokeless tobacco: Never Used  . Alcohol Use: Yes     Comment: socially  . Drug Use: No  . Sexual Activity: Not on file   Other Topics Concern  . Not on file   Social History Narrative   Diet- does watch   Patient gets regular exercise, 3x/week      Diabetic Foot Check: 08/25/2008     PHYSICAL EXAM  Filed Vitals:   05/31/13 1330  BP: 114/69  Pulse: 73  Height: _0  (1.626 m)  Weight: 136 lb (61.689 kg)   Body mass index  is 23.33 kg/(m^2).  Generalized: Well developed, in no acute distress  Head: normocephalic and atraumatic,. Oropharynx benign  Musculoskeletal: No deformity   Neurological examination   Mentation: Alert oriented to time, place, history taking. Follows all commands speech and language fluent  Cranial nerve II-XII: Pupils were equal round reactive to light extraocular movements were full, visual field were  full on confrontational test. Facial sensation and strength were normal. hearing was intact to finger rubbing bilaterally. Uvula tongue midline. head turning and shoulder shrug were normal and symmetric.Tongue protrusion into cheek strength was normal. Motor: normal bulk and tone, full strength in the BUE, BLE,  No focal weakness Sensory: normal and symmetric to light touch, pinprick, and  vibration  Coordination: finger-nose-finger, heel-to-shin bilaterally, no dysmetria Reflexes: Brachioradialis 2/2, biceps 2/2, triceps 2/2, patellar 2/2, Achilles 2/2, plantar responses were flexor bilaterally. Gait and Station: Rising up from seated position without assistance, normal stance,  moderate stride, good arm swing, smooth turning, able to perform tiptoe, and heel walking without difficulty. Tandem gait is steady  DIAGNOSTIC DATA (LABS, IMAGING, TESTING) - I reviewed patient records, labs, notes, testing and imaging myself where available.  Lab Results  Component Value Date   WBC 6.3 02/07/2013   HGB 13.1 02/07/2013   HCT 38.7 02/07/2013   MCV 91.0 02/07/2013   PLT 206.0 02/07/2013      Component Value Date/Time   NA 141 02/07/2013 1532   K 5.0 02/07/2013 1532   CL 104 02/07/2013 1532   CO2 32 02/07/2013 1532   GLUCOSE 156* 02/07/2013 1532   GLUCOSE 126* 11/11/2005 0831   BUN 33* 02/07/2013 1532   CREATININE 0.8 02/07/2013 1532   CALCIUM 9.6 02/07/2013 1532   PROT 7.1 02/07/2013 1532   ALBUMIN 4.0 02/07/2013 1532   AST 20 02/07/2013 1532   ALT 16 02/07/2013 1532   ALKPHOS 81 02/07/2013 1532    BILITOT 0.4 02/07/2013 1532   GFRNONAA >60 04/30/2010 1240   GFRAA  Value: >60        The eGFR has been calculated using the MDRD equation. This calculation has not been validated in all clinical situations. eGFR's persistently <60 mL/min signify possible Chronic Kidney Disease. 04/30/2010 1240     ASSESSMENT AND PLAN  68 y.o. year old female  has a past medical history of Trigeminal neuralgia;  Cervical spondylosis without myelopathy (11/30/2012); headaches and History of Lyme disease. here to follow up.   Continue propanolol 10 mg in the morning and 20 mg at night, will refill Continue gabapentin at current dose  continue amitriptyline at current dose Followup in 6 months Dennie Bible, [facility], [facility], APRN  [facility] [address] [place], [place] [address] [phone]

## 2013-05-31 NOTE — Patient Instructions (Signed)
Continue propanolol 10 mg in the morning and 20 mg at night, will refill Continue gabapentin at current dose  continue amitriptyline at current dose Followup in 6 months

## 2013-06-05 DIAGNOSIS — H04129 Dry eye syndrome of unspecified lacrimal gland: Secondary | ICD-10-CM | POA: Diagnosis not present

## 2013-06-06 DIAGNOSIS — Z124 Encounter for screening for malignant neoplasm of cervix: Secondary | ICD-10-CM | POA: Diagnosis not present

## 2013-06-19 ENCOUNTER — Other Ambulatory Visit (HOSPITAL_COMMUNITY): Payer: Self-pay | Admitting: Obstetrics and Gynecology

## 2013-06-19 DIAGNOSIS — Z1231 Encounter for screening mammogram for malignant neoplasm of breast: Secondary | ICD-10-CM

## 2013-06-26 ENCOUNTER — Other Ambulatory Visit (INDEPENDENT_AMBULATORY_CARE_PROVIDER_SITE_OTHER): Payer: Medicare Other

## 2013-06-26 ENCOUNTER — Telehealth: Payer: Self-pay

## 2013-06-26 DIAGNOSIS — R300 Dysuria: Secondary | ICD-10-CM | POA: Diagnosis not present

## 2013-06-26 DIAGNOSIS — R319 Hematuria, unspecified: Secondary | ICD-10-CM

## 2013-06-26 LAB — POCT URINALYSIS DIPSTICK
Bilirubin, UA: NEGATIVE
Glucose, UA: NEGATIVE
Ketones, UA: NEGATIVE
Nitrite, UA: NEGATIVE
Protein, UA: NEGATIVE
Spec Grav, UA: 1.01
Urobilinogen, UA: 0.2
pH, UA: 6

## 2013-06-26 NOTE — Telephone Encounter (Signed)
Patient walked in to clinic with UTI symptoms. Unable to be seen today. Per Dr Larose Kells collected urine specimen. Performed POCT and sent for UA with micro.  Spoke with patient and advised that we would let her know as soon as results are back.

## 2013-06-27 ENCOUNTER — Ambulatory Visit: Payer: Medicare Other | Admitting: Internal Medicine

## 2013-06-27 LAB — URINALYSIS, ROUTINE W REFLEX MICROSCOPIC
Bilirubin Urine: NEGATIVE
Ketones, ur: NEGATIVE
Nitrite: NEGATIVE
Specific Gravity, Urine: <=1.005 — AB (ref 1.000–1.030)
Total Protein, Urine: NEGATIVE
Urine Glucose: NEGATIVE
Urobilinogen, UA: 0.2 (ref 0.0–1.0)
pH: 7.5 (ref 5.0–8.0)

## 2013-06-28 LAB — URINE CULTURE: Colony Count: >=100000

## 2013-06-29 MED ORDER — CIPROFLOXACIN HCL 500 MG PO TABS
500.0000 mg | ORAL_TABLET | Freq: Every day | ORAL | Status: DC
Start: 2013-06-29 — End: 2013-12-05

## 2013-06-29 NOTE — Addendum Note (Signed)
Addended by: Peggyann Shoals on: 06/29/2013 08:30 AM   Modules accepted: Orders

## 2013-07-15 ENCOUNTER — Other Ambulatory Visit: Payer: Self-pay | Admitting: Neurology

## 2013-07-16 ENCOUNTER — Other Ambulatory Visit: Payer: Self-pay | Admitting: Neurology

## 2013-07-16 NOTE — Telephone Encounter (Signed)
Per phone note:  Kathrynn Ducking, MD at 03/13/2013 5:34 PM     Status: Signed        I called the patient. She indicates that she is still on Inderal taking 10 mg twice a day. The patient never went up on the dose as prescribed. The patient not transient benefit from the propranolol, but never read a prescription for about going up on the dose. The patient will go to 20 mg twice a day.

## 2013-07-20 ENCOUNTER — Ambulatory Visit (HOSPITAL_COMMUNITY)
Admission: RE | Admit: 2013-07-20 | Discharge: 2013-07-20 | Disposition: A | Discharge status: Home / Self Care | Payer: Medicare Other | Source: Ambulatory Visit | Attending: Obstetrics and Gynecology | Admitting: Obstetrics and Gynecology

## 2013-07-20 DIAGNOSIS — Z1231 Encounter for screening mammogram for malignant neoplasm of breast: Secondary | ICD-10-CM

## 2013-07-25 ENCOUNTER — Encounter: Payer: Self-pay | Admitting: Internal Medicine

## 2013-07-26 ENCOUNTER — Other Ambulatory Visit: Payer: Medicare Other

## 2013-07-26 DIAGNOSIS — Z79899 Other long term (current) drug therapy: Secondary | ICD-10-CM | POA: Diagnosis not present

## 2013-08-01 DIAGNOSIS — H04129 Dry eye syndrome of unspecified lacrimal gland: Secondary | ICD-10-CM | POA: Diagnosis not present

## 2013-08-03 ENCOUNTER — Telehealth: Payer: Self-pay | Admitting: Neurology

## 2013-08-03 NOTE — Telephone Encounter (Signed)
Please advise previous note. Thanks  °

## 2013-08-03 NOTE — Telephone Encounter (Signed)
I called the patient. The patient has had significant problems with dryness of the eyes. She will cut back on the amitriptyline taking 12.5 mg at night. If this is still causing a lot of dryness, she may have come off the medication completely.

## 2013-08-03 NOTE — Telephone Encounter (Signed)
Patient called stating she has been having a problem with dry eyes for year 1- 1/2 and her optometrist and rheumatologist at [facility] wants her to ask Dr. Jannifer Franklin if it's okay to cut the Amitriptyline medication in half. Patient stated that she was told that amitriptyline has a side effect of dry eyes.

## 2013-08-03 NOTE — Telephone Encounter (Signed)
Patient called stating she has been having a problem with dry eyes for year 1- 1/2 and her optometrist and rheumatologist at [facility] wants her to ask Dr. Jannifer Franklin if it's okay to cut the Amitriptyline medication in half. Patient stated that she was told that amitriptyline has a side effect of dry eyes.

## 2013-08-07 ENCOUNTER — Encounter: Payer: Self-pay | Admitting: Internal Medicine

## 2013-08-07 ENCOUNTER — Other Ambulatory Visit: Payer: Self-pay | Admitting: Internal Medicine

## 2013-08-07 ENCOUNTER — Telehealth: Payer: Self-pay | Admitting: *Deleted

## 2013-08-07 MED ORDER — ALPRAZOLAM 0.5 MG PO TABS: ORAL_TABLET | ORAL | Status: DC

## 2013-08-07 NOTE — Telephone Encounter (Signed)
Xanax 0.5 mg [facility]ed [facility].

## 2013-08-10 DIAGNOSIS — L819 Disorder of pigmentation, unspecified: Secondary | ICD-10-CM | POA: Diagnosis not present

## 2013-08-10 DIAGNOSIS — D239 Other benign neoplasm of skin, unspecified: Secondary | ICD-10-CM | POA: Diagnosis not present

## 2013-08-10 DIAGNOSIS — D1801 Hemangioma of skin and subcutaneous tissue: Secondary | ICD-10-CM | POA: Diagnosis not present

## 2013-08-10 DIAGNOSIS — Z85820 Personal history of malignant melanoma of skin: Secondary | ICD-10-CM | POA: Diagnosis not present

## 2013-08-10 DIAGNOSIS — L821 Other seborrheic keratosis: Secondary | ICD-10-CM | POA: Diagnosis not present

## 2013-08-14 DIAGNOSIS — H2510 Age-related nuclear cataract, unspecified eye: Secondary | ICD-10-CM | POA: Diagnosis not present

## 2013-08-14 DIAGNOSIS — H04129 Dry eye syndrome of unspecified lacrimal gland: Secondary | ICD-10-CM | POA: Diagnosis not present

## 2013-08-14 DIAGNOSIS — H01029 Squamous blepharitis unspecified eye, unspecified eyelid: Secondary | ICD-10-CM | POA: Diagnosis not present

## 2013-08-28 ENCOUNTER — Other Ambulatory Visit: Payer: Self-pay | Admitting: Internal Medicine

## 2013-09-07 DIAGNOSIS — E119 Type 2 diabetes mellitus without complications: Secondary | ICD-10-CM | POA: Diagnosis not present

## 2013-09-13 DIAGNOSIS — E039 Hypothyroidism, unspecified: Secondary | ICD-10-CM | POA: Diagnosis not present

## 2013-09-13 DIAGNOSIS — E119 Type 2 diabetes mellitus without complications: Secondary | ICD-10-CM | POA: Diagnosis not present

## 2013-09-13 DIAGNOSIS — G609 Hereditary and idiopathic neuropathy, unspecified: Secondary | ICD-10-CM | POA: Diagnosis not present

## 2013-09-13 DIAGNOSIS — M810 Age-related osteoporosis without current pathological fracture: Secondary | ICD-10-CM | POA: Diagnosis not present

## 2013-11-11 ENCOUNTER — Other Ambulatory Visit: Payer: Self-pay | Admitting: Internal Medicine

## 2013-11-13 NOTE — Telephone Encounter (Signed)
Pt is requesting refill for Alprazolam  Last OV: 02/07/2013 Last Fill: 08/07/2013 # [id number] 0RF UDS: 02/20/2013 Moderate risk  Please advise.

## 2013-11-14 DIAGNOSIS — N952 Postmenopausal atrophic vaginitis: Secondary | ICD-10-CM | POA: Diagnosis not present

## 2013-11-14 DIAGNOSIS — N390 Urinary tract infection, site not specified: Secondary | ICD-10-CM | POA: Diagnosis not present

## 2013-11-14 DIAGNOSIS — N760 Acute vaginitis: Secondary | ICD-10-CM | POA: Diagnosis not present

## 2013-11-14 NOTE — Telephone Encounter (Signed)
Placed in folder for Dr. Larose Kells review.

## 2013-11-14 NOTE — Telephone Encounter (Signed)
Had a UDS few months ago, ask  lab to reprint the  report

## 2013-11-14 NOTE — Telephone Encounter (Signed)
Pt is requesting refill for Alprazolam  Last OV: 02/07/2013 Last Fill: 08/07/2013 # [id number] 0RF UDS: 02/20/2013 Moderate risk  Please advise.

## 2013-11-15 ENCOUNTER — Telehealth: Payer: Self-pay

## 2013-11-15 ENCOUNTER — Encounter: Payer: Self-pay | Admitting: Internal Medicine

## 2013-11-15 MED ORDER — ALPRAZOLAM 0.5 MG PO TABS: ORAL_TABLET | ORAL | Status: DC

## 2013-11-15 NOTE — Telephone Encounter (Signed)
Done UDS 07-26-2013 ------ low

## 2013-11-15 NOTE — Telephone Encounter (Signed)
UDS: 07/26/2013  Positive for Amitriptyline Negative for Xanax PRN Positive for Gabapentin   Low risk per Dr. Larose Kells 11/15/2013

## 2013-11-15 NOTE — Telephone Encounter (Signed)
Faxed to [facility].

## 2013-11-21 ENCOUNTER — Encounter: Payer: Self-pay | Admitting: Internal Medicine

## 2013-12-05 ENCOUNTER — Encounter: Payer: Self-pay | Admitting: Nurse Practitioner

## 2013-12-05 ENCOUNTER — Ambulatory Visit (INDEPENDENT_AMBULATORY_CARE_PROVIDER_SITE_OTHER): Payer: Medicare Other | Admitting: Nurse Practitioner

## 2013-12-05 VITALS — BP 114/72 | HR 71 | Ht 64.0 in | Wt 138.4 lb

## 2013-12-05 DIAGNOSIS — G500 Trigeminal neuralgia: Secondary | ICD-10-CM | POA: Diagnosis not present

## 2013-12-05 DIAGNOSIS — M47812 Spondylosis without myelopathy or radiculopathy, cervical region: Secondary | ICD-10-CM | POA: Diagnosis not present

## 2013-12-05 MED ORDER — AMITRIPTYLINE HCL 25 MG PO TABS: 25.0000 mg | ORAL_TABLET | Freq: Every day | ORAL | Status: DC

## 2013-12-05 MED ORDER — GABAPENTIN 300 MG PO CAPS: ORAL_CAPSULE | ORAL | Status: DC

## 2013-12-05 NOTE — Progress Notes (Signed)
I have read the note, and I agree with the clinical assessment and plan.  Montserrat Shek KEITH   

## 2013-12-05 NOTE — Patient Instructions (Addendum)
Continue propanolol 10 mg in the am, 20 mg at night will refill Continue gabapentin at current dose, will refill Increase amitriptyline by 1/2 tablet each night for right trigeminal nerve pain.  If shooting pain is still present after 1 week, increase to 2 tablets at night. If pain persists, please call the office for instructions. Followup in 6 months, sooner as needed.

## 2013-12-05 NOTE — Progress Notes (Signed)
PATIENT: [name] [name] DOB: 1945-08-27  REASON FOR VISIT: routine follow up for muscular headaches, trigeminal neuralgia HISTORY FROM: patient  HISTORY OF PRESENT ILLNESS: [name] [name], 68 year old female returns for followup. She was last seen in this office 05/31/2013 by Hoyle Sauer, NP. Headaches had come under better control. Her headace is associated with a pulling sensation and a pressure sensation. She was placed on Inderal and is currently taking 10 mg in the morning and 20 mg at night with good relief of her headaches. In addition she has left trigeminal neuralgia V2 distribution and is currently on gabapentin with good benefit. She also takes amitriptyline, which is 25 mg each night. She returns for reevaluation.  She was treated for Melanoma on the right cheek, in which the lesion was excised, but then needed a second surgery because the pathology showed remaining cancer on the borders. The lesion was removed successfully and her face has healed quite nicely. She has had difficulty though with a loss of flexion of her jaw and has been working with her dentist to try to gain more range of motion. She is taking a muscle relaxant to help with this. Incidentally, she has had some shooting pain in the right cheek that radiates around and above her right eye. She also has shooting pain occasionally when chewing.   HISTORY: of Lyme disease in the past. The patient has developed significant issues with headaches that are on the top of the head associated with a pulling sensation and a pressure sensation. The headaches are better in the morning, and worse as the day goes on. The headaches tend to start around 12 noon and get worse towards the evening. The patient has a history of cervical spine surgery in the past, but she denies any change in neck discomfort or shoulder discomfort. The patient is having some nausea with the headache without vomiting. The patient has some blurring of vision. The  patient is on amitriptyline at night at 25 mg, but she has significant problems with dry eyes and dry mouth, and she is being evaluated for possible Sjogren syndrome. The patient has a burning sensation at times with the headache. The patient reports no new numbness or weakness on the face, arms, or legs. With the headaches, she may get a tingling sensation into the right face and down the right arm. The patient has had blood work this morning, and she believes that this did include a sedimentation rate. The patient is on gabapentin taking 300 mg the morning and 600 mg in the evening. The patient sleeps well at night.   REVIEW OF SYSTEMS: Full 14 system review of systems performed and notable only for: hearing loss, snoring, neck pain   ALLERGIES: Allergies  Allergen Reactions  . Ceftriaxone Sodium Anaphylaxis  . Rocephin [Ceftriaxone]   . Benadryl [Diphenhydramine Hcl] Anxiety    Hyperactivity    HOME MEDICATIONS: Outpatient Prescriptions Prior to Visit  Medication Sig Dispense Refill  . ALPRAZolam (XANAX) 0.5 MG tablet TAKE 1 TABLET BY MOUTH AT BEDTIME AS NEEDED FOR SLEEP 90 tablet 0  . Cholecalciferol (VITAMIN D3) 2000 UNITS TABS Take 1 tablet by mouth daily.     . CRESTOR 10 MG tablet TAKE 1 TABLET (10MG  TOTAL) BY MOUTH DAILY 90 tablet 1  . levothyroxine (SYNTHROID) 25 MCG tablet Take 25 mcg by mouth daily. Saturday and Sunday only,  50mg     . Multiple Vitamin (MULTIVITAMIN) tablet Take 1 tablet by mouth daily.      [name]  propranolol (INDERAL) 10 MG tablet TAKE 1 TABLET BY MOUTH TWICE DAILY FOR 1 WEEK, THEN 2 TABLETS TWICE DAILY 360 tablet 1  . amitriptyline (ELAVIL) 25 MG tablet Take 12.5 mg by mouth at bedtime.    . gabapentin (NEURONTIN) 300 MG capsule 1 capsule in the morning, 2 in the evening 270 capsule 3  . ciprofloxacin (CIPRO) 500 MG tablet Take 1 tablet (500 mg total) by mouth daily. 6 tablet 0  . erythromycin ophthalmic ointment at bedtime.    . prednisoLONE acetate (PRED FORTE)  1 % ophthalmic suspension Place 1 drop into both eyes daily.      No facility-administered medications prior to visit.    PHYSICAL EXAM Filed Vitals:   12/05/13 1032  BP: 114/72  Pulse: 71  Height: 5\' 4"  (1.626 m)  Weight: 138 lb 6.4 oz (62.778 kg)   Body mass index is 23.74 kg/(m^2).  Generalized: Well developed, in no acute distress  Head: normocephalic and atraumatic, Oropharynx benign  Musculoskeletal: No deformity   Neurological examination  Mentation: Alert oriented to time, place, history taking. Follows all commands speech and language fluent  Cranial nerve II-XII: Pupils were equal round reactive to light extraocular movements were full, visual field were full on confrontational test. Facial sensation and strength were normal. hearing was intact to finger rubbing bilaterally. Uvula tongue midline. head turning and shoulder shrug were normal and symmetric.Tongue protrusion into cheek strength was normal.  Motor: normal bulk and tone, full strength in the BUE, BLE, No focal weakness  Sensory: normal and symmetric to light touch, pinprick, and vibration  Coordination: finger-nose-finger, heel-to-shin bilaterally, no dysmetria  Reflexes: Brachioradialis 2/2, biceps 2/2, triceps 2/2, patellar 2/2, Achilles 2/2, plantar responses were flexor bilaterally.  Gait and Station: Rising up from seated position without assistance, normal stance, moderate stride, good arm swing, smooth turning, able to perform tiptoe, and heel walking without difficulty. Tandem gait is steady   ASSESSMENT: 68 y.o. year old female  has a past medical history of Trigeminal neuralgia; Cervical spondylosis without myelopathy (11/30/2012); headaches, Melanoma and History of Lyme disease here to follow up.  Her headaches and left trigeminal neuralgia is under good control. She is now likely having a flare up of right Trigeminal Neuralgia, aggrivated by Moh's Surgery for Melanoma on the right  cheek.  PLAN: Continue propanolol 10 mg each morning and 20 mg each night, will refill Continue gabapentin at current dose. Increase amitriptyline by 1/2 tablet each night for right trigeminal nerve pain.  If shooting pain is still present after 1 week, increase to 2 tablets at night. If pain persists, please call the office for instructions. Followup in 6 months, sooner as needed.  Meds ordered this encounter  Medications  . gabapentin (NEURONTIN) 300 MG capsule    Sig: 1 capsule in the morning, 2 in the evening    Dispense:  270 capsule    Refill:  3    Order Specific Question:  Supervising Provider    Answer:  Margette Fast K [[id number]]  . amitriptyline (ELAVIL) 25 MG tablet    Sig: Take 1-2 tablets (25-50 mg total) by mouth at bedtime.    Dispense:  180 tablet    Refill:  2    Order Specific Question:  Supervising Provider    Answer:  Kathrynn Ducking [[id number]]   Return in about 6 months (around 06/05/2014) for trigeminal neuralgia.  Rudi Rummage LAM, MSN, FNP-BC, A/GNP-C 12/05/2013, 11:57 AM [facility] [address]  [place], [place] [address] [phone]  Note: This document was prepared with digital dictation and possible smart phrase technology. Any transcriptional errors that result from this process are unintentional.

## 2013-12-10 ENCOUNTER — Other Ambulatory Visit: Payer: Self-pay

## 2013-12-10 MED ORDER — AMITRIPTYLINE HCL 25 MG PO TABS: 25.0000 mg | ORAL_TABLET | Freq: Every day | ORAL | Status: DC

## 2013-12-10 MED ORDER — GABAPENTIN 300 MG PO CAPS: ORAL_CAPSULE | ORAL | Status: DC

## 2013-12-18 DIAGNOSIS — H01022 Squamous blepharitis right lower eyelid: Secondary | ICD-10-CM | POA: Diagnosis not present

## 2013-12-18 DIAGNOSIS — H01025 Squamous blepharitis left lower eyelid: Secondary | ICD-10-CM | POA: Diagnosis not present

## 2013-12-18 DIAGNOSIS — H01021 Squamous blepharitis right upper eyelid: Secondary | ICD-10-CM | POA: Diagnosis not present

## 2013-12-18 DIAGNOSIS — H01024 Squamous blepharitis left upper eyelid: Secondary | ICD-10-CM | POA: Diagnosis not present

## 2013-12-18 DIAGNOSIS — H04123 Dry eye syndrome of bilateral lacrimal glands: Secondary | ICD-10-CM | POA: Diagnosis not present

## 2014-01-13 ENCOUNTER — Encounter (HOSPITAL_BASED_OUTPATIENT_CLINIC_OR_DEPARTMENT_OTHER): Payer: Self-pay

## 2014-01-13 ENCOUNTER — Emergency Department (HOSPITAL_BASED_OUTPATIENT_CLINIC_OR_DEPARTMENT_OTHER): Payer: Medicare Other

## 2014-01-13 ENCOUNTER — Emergency Department (HOSPITAL_BASED_OUTPATIENT_CLINIC_OR_DEPARTMENT_OTHER)
Admission: EM | Admit: 2014-01-13 | Discharge: 2014-01-13 | Disposition: A | Discharge status: Home / Self Care | Payer: Medicare Other | Attending: Emergency Medicine | Admitting: Emergency Medicine

## 2014-01-13 DIAGNOSIS — Z872 Personal history of diseases of the skin and subcutaneous tissue: Secondary | ICD-10-CM | POA: Insufficient documentation

## 2014-01-13 DIAGNOSIS — E039 Hypothyroidism, unspecified: Secondary | ICD-10-CM | POA: Insufficient documentation

## 2014-01-13 DIAGNOSIS — K219 Gastro-esophageal reflux disease without esophagitis: Secondary | ICD-10-CM | POA: Insufficient documentation

## 2014-01-13 DIAGNOSIS — R1031 Right lower quadrant pain: Secondary | ICD-10-CM | POA: Diagnosis not present

## 2014-01-13 DIAGNOSIS — M549 Dorsalgia, unspecified: Secondary | ICD-10-CM | POA: Insufficient documentation

## 2014-01-13 DIAGNOSIS — Z9089 Acquired absence of other organs: Secondary | ICD-10-CM | POA: Insufficient documentation

## 2014-01-13 DIAGNOSIS — R1030 Lower abdominal pain, unspecified: Secondary | ICD-10-CM | POA: Diagnosis not present

## 2014-01-13 DIAGNOSIS — R1032 Left lower quadrant pain: Secondary | ICD-10-CM | POA: Insufficient documentation

## 2014-01-13 DIAGNOSIS — R109 Unspecified abdominal pain: Secondary | ICD-10-CM

## 2014-01-13 DIAGNOSIS — Z792 Long term (current) use of antibiotics: Secondary | ICD-10-CM | POA: Insufficient documentation

## 2014-01-13 DIAGNOSIS — Z79899 Other long term (current) drug therapy: Secondary | ICD-10-CM | POA: Diagnosis not present

## 2014-01-13 DIAGNOSIS — Z87891 Personal history of nicotine dependence: Secondary | ICD-10-CM | POA: Diagnosis not present

## 2014-01-13 DIAGNOSIS — E119 Type 2 diabetes mellitus without complications: Secondary | ICD-10-CM | POA: Insufficient documentation

## 2014-01-13 DIAGNOSIS — M1990 Unspecified osteoarthritis, unspecified site: Secondary | ICD-10-CM | POA: Diagnosis not present

## 2014-01-13 DIAGNOSIS — F419 Anxiety disorder, unspecified: Secondary | ICD-10-CM | POA: Diagnosis not present

## 2014-01-13 DIAGNOSIS — Z90710 Acquired absence of both cervix and uterus: Secondary | ICD-10-CM | POA: Insufficient documentation

## 2014-01-13 DIAGNOSIS — Z9889 Other specified postprocedural states: Secondary | ICD-10-CM | POA: Diagnosis not present

## 2014-01-13 DIAGNOSIS — Z85820 Personal history of malignant melanoma of skin: Secondary | ICD-10-CM | POA: Diagnosis not present

## 2014-01-13 LAB — COMPREHENSIVE METABOLIC PANEL
ALT: 21 U/L (ref 0–35)
AST: 20 U/L (ref 0–37)
Albumin: 4.3 g/dL (ref 3.5–5.2)
Alkaline Phosphatase: 84 U/L (ref 39–117)
Anion gap: 7 (ref 5–15)
BUN: 20 mg/dL (ref 6–23)
CO2: 31 mmol/L (ref 19–32)
Calcium: 9.6 mg/dL (ref 8.4–10.5)
Chloride: 105 mEq/L (ref 96–112)
Creatinine, Ser: 0.78 mg/dL (ref 0.50–1.10)
GFR calc Af Amer: >90 mL/min (ref >90)
GFR calc non Af Amer: 84 mL/min — ABNORMAL LOW (ref >90)
Glucose, Bld: 209 mg/dL — ABNORMAL HIGH (ref 70–99)
Potassium: 4.2 mmol/L (ref 3.5–5.1)
Sodium: 143 mmol/L (ref 135–145)
Total Bilirubin: 0.2 mg/dL — ABNORMAL LOW (ref 0.3–1.2)
Total Protein: 7.1 g/dL (ref 6.0–8.3)

## 2014-01-13 LAB — URINALYSIS, ROUTINE W REFLEX MICROSCOPIC
Bilirubin Urine: NEGATIVE
Glucose, UA: NEGATIVE mg/dL
Hgb urine dipstick: NEGATIVE
Ketones, ur: NEGATIVE mg/dL
Leukocytes, UA: NEGATIVE
Nitrite: NEGATIVE
Protein, ur: NEGATIVE mg/dL
Specific Gravity, Urine: 1.022 (ref 1.005–1.030)
Urobilinogen, UA: 0.2 mg/dL (ref 0.0–1.0)
pH: 7.5 (ref 5.0–8.0)

## 2014-01-13 LAB — CBC WITH DIFFERENTIAL/PLATELET
Basophils Absolute: 0 10*3/uL (ref 0.0–0.1)
Basophils Relative: 0 % (ref 0–1)
Eosinophils Absolute: 0.1 10*3/uL (ref 0.0–0.7)
Eosinophils Relative: 1 % (ref 0–5)
HCT: 40.7 % (ref 36.0–46.0)
Hemoglobin: 13.9 g/dL (ref 12.0–15.0)
Lymphocytes Relative: 22 % (ref 12–46)
Lymphs Abs: 1.7 10*3/uL (ref 0.7–4.0)
MCH: 31.6 pg (ref 26.0–34.0)
MCHC: 34.2 g/dL (ref 30.0–36.0)
MCV: 92.5 fL (ref 78.0–100.0)
Monocytes Absolute: 0.8 10*3/uL (ref 0.1–1.0)
Monocytes Relative: 10 % (ref 3–12)
Neutro Abs: 5.3 10*3/uL (ref 1.7–7.7)
Neutrophils Relative %: 67 % (ref 43–77)
Platelets: 219 10*3/uL (ref 150–400)
RBC: 4.4 MIL/uL (ref 3.87–5.11)
RDW: 12.1 % (ref 11.5–15.5)
WBC: 7.9 10*3/uL (ref 4.0–10.5)

## 2014-01-13 LAB — LIPASE, BLOOD: Lipase: 45 U/L (ref 11–59)

## 2014-01-13 MED ORDER — SODIUM CHLORIDE 0.9 % IV BOLUS (SEPSIS)
500.0000 mL | Freq: Once | INTRAVENOUS | Status: AC
  Administered 2014-01-13: 500 mL via INTRAVENOUS

## 2014-01-13 MED ORDER — DICYCLOMINE HCL 20 MG PO TABS: 20.0000 mg | ORAL_TABLET | Freq: Two times a day (BID) | ORAL | Status: DC | PRN

## 2014-01-13 MED ORDER — MORPHINE SULFATE 4 MG/ML IJ SOLN
4.0000 mg | Freq: Once | INTRAMUSCULAR | Status: AC
  Administered 2014-01-13: 4 mg via INTRAVENOUS
  Filled 2014-01-13: qty 1

## 2014-01-13 MED ORDER — ONDANSETRON HCL 4 MG/2ML IJ SOLN
4.0000 mg | Freq: Once | INTRAMUSCULAR | Status: AC
  Administered 2014-01-13: 4 mg via INTRAVENOUS
  Filled 2014-01-13: qty 2

## 2014-01-13 MED ORDER — IOHEXOL 300 MG/ML  SOLN
100.0000 mL | Freq: Once | INTRAMUSCULAR | Status: AC | PRN
  Administered 2014-01-13: 100 mL via INTRAVENOUS

## 2014-01-13 MED ORDER — HYDROCODONE-ACETAMINOPHEN 5-325 MG PO TABS: 1.0000 | ORAL_TABLET | ORAL | Status: DC | PRN

## 2014-01-13 MED ORDER — IOHEXOL 300 MG/ML  SOLN
50.0000 mL | Freq: Once | INTRAMUSCULAR | Status: AC | PRN
  Administered 2014-01-13: 50 mL via ORAL

## 2014-01-13 NOTE — ED Provider Notes (Signed)
CSN: [id number]     Arrival date & time 01/13/14  0415 History   First MD Initiated Contact with Patient 01/13/14 0434     Chief Complaint  Patient presents with  . Abdominal Pain     (Consider location/radiation/quality/duration/timing/severity/associated sxs/prior Treatment) HPI Patient states she's been having episodic abdominal pain for the past day. The pain became much more severe this evening around 1 AM. She states the pain radiated to her low back. Not associated with fever or chills. No urinary symptoms including no dysuria, hematuria, urgency or frequency. Denied nausea vomiting or diarrhea. Patient's last bowel movement was this morning and states was normal. No blood in stool. Patient denies any similar symptoms previously. Past Medical History  Diagnosis Date  . Hyperlipidemia   . Osteopenia   . Diverticulosis of colon   . GERD (gastroesophageal reflux disease)   . Abdominal pain     EGD and CTs neg, rx Amitriptiline (non ulcer dyspepsia) EGD 12/11.. s/p ballon dilitation  . Anxiety   . Solar lentigo     face; s/p excision 3/09. See Derm q 6 months  . Hypothyroidism      Dx 10-9 TSH 5.2 , then TSH normalized, eventually went up again 2012  . Trigeminal neuralgia     h/o   . Melanoma     h/o  . Diabetes mellitus     no meds, diet controlled  . Cervical spondylosis without myelopathy 11/30/2012  . History of Lyme disease    Past Surgical History  Procedure Laterality Date  . Cervical fusion    . Cholecystectomy    . Abdominal hysterectomy    . Shoulder surgery      right  . Colon resection  1999    d/t diverticulitis  . Wrist surgery      removed melaoma, right  . Colon surgery    . Melanoma resection Right     face   Family History  Problem Relation Age of Onset  . Colon cancer Neg Hx   . Breast cancer Neg Hx   . Diabetes Neg Hx   . Stomach cancer Neg Hx   . Rectal cancer Neg Hx   . Prostate cancer Father   . Heart attack Paternal Grandfather      GF in his 67s  . Melanoma Maternal Uncle   . Endometrial cancer Sister     clear cell carcinoma   History  Substance Use Topics  . Smoking status: Former Smoker    Start date: 01/20/1976  . Smokeless tobacco: Never Used  . Alcohol Use: Yes   OB History    No data available     Review of Systems  Constitutional: Negative for fever and chills.  Respiratory: Negative for shortness of breath.   Cardiovascular: Negative for chest pain.  Gastrointestinal: Positive for abdominal pain. Negative for nausea, vomiting, diarrhea and constipation.  Genitourinary: Negative for dysuria, hematuria and flank pain.  Musculoskeletal: Positive for back pain. Negative for myalgias, neck pain and neck stiffness.  Skin: Negative for rash and wound.  Neurological: Negative for dizziness, weakness, light-headedness, numbness and headaches.  All other systems reviewed and are negative.     Allergies  Ceftriaxone sodium; Rocephin; and Benadryl  Home Medications   Prior to Admission medications   Medication Sig Start Date End Date Taking? Authorizing Provider  ALPRAZolam Duanne Moron) 0.5 MG tablet TAKE 1 TABLET BY MOUTH AT BEDTIME AS NEEDED FOR SLEEP 11/15/13   Colon Branch, MD  amitriptyline (  ELAVIL) 25 MG tablet Take 1-2 tablets (25-50 mg total) by mouth at bedtime. 12/10/13   Kathrynn Ducking, MD  Cholecalciferol (VITAMIN D3) 2000 UNITS TABS Take 1 tablet by mouth daily.     Historical Provider, MD  CRESTOR 10 MG tablet TAKE 1 TABLET (10MG  TOTAL) BY MOUTH DAILY 08/28/13   Colon Branch, MD  cyclobenzaprine (FLEXERIL) 10 MG tablet Take 10 mg by mouth daily as needed. 10/18/13   Historical Provider, MD  gabapentin (NEURONTIN) 300 MG capsule 1 capsule in the morning, 2 in the evening 12/10/13   Kathrynn Ducking, MD  levothyroxine (SYNTHROID) 25 MCG tablet Take 25 mcg by mouth daily. Saturday and Sunday only,  50mg     Historical Provider, MD  Multiple Vitamin (MULTIVITAMIN) tablet Take 1 tablet by mouth daily.       Historical Provider, MD  propranolol (INDERAL) 10 MG tablet TAKE 1 TABLET BY MOUTH TWICE DAILY FOR 1 WEEK, THEN 2 TABLETS TWICE DAILY 07/16/13   Kathrynn Ducking, MD   BP 148/60 mmHg  Pulse 98  Temp(Src) 98.8 F (37.1 C)  Resp 20  Ht 5\' 4"  (1.626 m)  Wt 135 lb (61.236 kg)  BMI 23.16 kg/m2  SpO2 96%  LMP  Physical Exam  Constitutional: She is oriented to person, place, and time. She appears well-developed and well-nourished. No distress.  HENT:  Head: Normocephalic and atraumatic.  Mouth/Throat: Oropharynx is clear and moist.  Eyes: EOM are normal. Pupils are equal, round, and reactive to light.  Neck: Normal range of motion. Neck supple.  Cardiovascular: Normal rate and regular rhythm.   Pulmonary/Chest: Effort normal and breath sounds normal. No respiratory distress. She has no wheezes. She has no rales.  Abdominal: Soft. Bowel sounds are normal. She exhibits distension. There is tenderness (diffuse abdominal tenderness especially in the left and right lower quadrants. There is no rebound or guarding.). There is no rebound and no guarding.  Musculoskeletal: Normal range of motion. She exhibits no edema or tenderness.  No CVA tenderness bilaterally.  Neurological: She is alert and oriented to person, place, and time.  Moves all extremities without deficit. Sensation is grossly intact.  Skin: Skin is warm and dry. No rash noted. No erythema.  Psychiatric: She has a normal mood and affect. Her behavior is normal.  Nursing note and vitals reviewed.   ED Course  Procedures (including critical care time) Labs Review Labs Reviewed  COMPREHENSIVE METABOLIC PANEL - Abnormal; Notable for the following:    Glucose, Bld 209 (*)    Total Bilirubin 0.2 (*)    GFR calc non Af Amer 84 (*)    All other components within normal limits  CBC WITH DIFFERENTIAL  URINALYSIS, ROUTINE W REFLEX MICROSCOPIC  LIPASE, BLOOD    Imaging Review Ct Abdomen Pelvis W Contrast  01/13/2014   CLINICAL  DATA:  Acute onset of lower abdominal pain for 1 week. Initial encounter.  EXAM: CT ABDOMEN AND PELVIS WITH CONTRAST  TECHNIQUE: Multidetector CT imaging of the abdomen and pelvis was performed using the standard protocol following bolus administration of intravenous contrast.  CONTRAST:  181mL OMNIPAQUE IOHEXOL 300 MG/ML SOLN, 47mL OMNIPAQUE IOHEXOL 300 MG/ML SOLN  COMPARISON:  CT of the abdomen and pelvis from 04/19/2012  FINDINGS: Minimal bibasilar atelectasis is noted.  The liver and spleen are unremarkable in appearance. The patient is status post cholecystectomy, with clips noted at the gallbladder fossa. The pancreas and adrenal glands are unremarkable.  Somewhat prominent bilateral pelvicaliectasis is noted,  with a few small left renal parapelvic cysts also seen. There is no definite evidence of hydronephrosis. No distal obstructing stone is seen. No perinephric stranding is appreciated.  No free fluid is identified. The small bowel is unremarkable in appearance. The stomach is within normal limits. No acute vascular abnormalities are seen. Scattered calcification is seen along the abdominal aorta and its branches.  The appendix is normal in caliber, without evidence for appendicitis. Postoperative change is noted at the mid sigmoid colon. The colon is unremarkable in appearance.  The bladder is significantly distended and grossly unremarkable. The patient is status post hysterectomy. No suspicious adnexal masses are seen. No inguinal lymphadenopathy is seen.  No acute osseous abnormalities are identified.  IMPRESSION: 1. No acute abnormality seen to explain the patient's symptoms. 2. Somewhat prominent bilateral pelvicaliectasis noted, slightly more prominent than on prior studies, without definite hydronephrosis. No obstructing stone seen. Few small left renal parapelvic cysts noted. 3. Scattered calcification along the abdominal aorta and its branches.   Electronically Signed   By: Garald Balding M.D.    On: 01/13/2014 06:19     EKG Interpretation None      MDM   Final diagnoses:  Abdominal pain    Abdominal pain is significantly improved after morphine. Abdomen is soft. Laboratory workup and CT scan are normal. Symptoms could likely be due to bowel spasm. I'm very low suspicion for mesenteric ischemia. Patient is aware that she needs to return immediately for worsening pain, persistent vomiting, fever or for any concerns. She will follow up with primary doctor.    Julianne Rice, MD 01/13/14 [phone]

## 2014-01-13 NOTE — ED Notes (Signed)
Pt c/o severe lower abdominal pain radiating to lowe back since 1am, getting worse, unable to sit; denies n/v, last NBM this am

## 2014-01-13 NOTE — Discharge Instructions (Signed)

## 2014-01-15 ENCOUNTER — Encounter: Payer: Self-pay | Admitting: Family Medicine

## 2014-01-15 ENCOUNTER — Ambulatory Visit (INDEPENDENT_AMBULATORY_CARE_PROVIDER_SITE_OTHER): Payer: Medicare Other | Admitting: Family Medicine

## 2014-01-15 ENCOUNTER — Ambulatory Visit (INDEPENDENT_AMBULATORY_CARE_PROVIDER_SITE_OTHER): Payer: Medicare Other | Admitting: *Deleted

## 2014-01-15 ENCOUNTER — Other Ambulatory Visit: Payer: Self-pay | Admitting: Family Medicine

## 2014-01-15 VITALS — BP 118/72 | HR 71 | Temp 98.1°F | Wt 137.2 lb

## 2014-01-15 DIAGNOSIS — Z23 Encounter for immunization: Secondary | ICD-10-CM | POA: Diagnosis not present

## 2014-01-15 DIAGNOSIS — R1012 Left upper quadrant pain: Secondary | ICD-10-CM | POA: Diagnosis not present

## 2014-01-15 DIAGNOSIS — K589 Irritable bowel syndrome without diarrhea: Secondary | ICD-10-CM

## 2014-01-15 DIAGNOSIS — R1013 Epigastric pain: Secondary | ICD-10-CM | POA: Diagnosis not present

## 2014-01-15 MED ORDER — HYOSCYAMINE SULFATE 0.125 MG SL SUBL: 0.1250 mg | SUBLINGUAL_TABLET | SUBLINGUAL | Status: DC | PRN

## 2014-01-15 MED ORDER — GI COCKTAIL ~~LOC~~
30.0000 mL | Freq: Once | ORAL | Status: AC
  Administered 2014-01-15: 30 mL via ORAL

## 2014-01-15 MED ORDER — OMEPRAZOLE 20 MG PO CPDR: 20.0000 mg | DELAYED_RELEASE_CAPSULE | Freq: Every day | ORAL | Status: DC

## 2014-01-15 NOTE — Progress Notes (Signed)
  SThe diagnosis was discussed with the patient and evaluation and treatment plans outlined. See orders for lab and imaging studies. Adhere to simple, bland diet. Initiate empiric trial of acid suppression; see orders. Initiate trial of anticholinergic medications for IBS. Further follow-up plans will be based on outcome of lab/imaging studies; see orders. Follow up as needed.  subjective:     [name] [name] is a 68 y.o. female who presents for evaluation of abdominal pain. Onset was several days ago. Symptoms have been gradually improving. The pain is described as aching and cramping, and is 4/10 in intensity. Pain is located in the epigastric region and suprapubic region without radiation.  Aggravating factors: eating.  Alleviating factors: belching and H2 blockers. Associated symptoms: belching and diarrhea. The patient denies anorexia, arthralagias, chills, constipation, dysuria, fever, flatus, frequency, headache, hematochezia, hematuria, melena, myalgias, nausea, sweats and vomiting.  The patient's history has been marked as reviewed and updated as appropriate.  Review of Systems Constitutional: negative for anorexia, chills, fatigue, fevers, malaise, night sweats, sweats and weight loss Gastrointestinal: positive for abdominal pain, change in bowel habits, diarrhea, dyspepsia and reflux symptoms, negative for constipation, dysphagia, jaundice, melena, nausea, odynophagia and vomiting     Objective:    BP 118/72 mmHg  Pulse 71  Temp(Src) 98.1 F (36.7 C) (Oral)  Wt 137 lb 3.2 oz (62.234 kg)  SpO2 98% General appearance: alert, cooperative, appears stated age and no distress Throat: lips, mucosa, and tongue normal; teeth and gums normal Neck: no adenopathy, no carotid bruit, no JVD, supple, symmetrical, trachea midline and thyroid not enlarged, symmetric, no tenderness/mass/nodules Lungs: clear to auscultation bilaterally Heart: S1, S2 normal Abdomen: abnormal findings:   moderate tenderness in the epigastrium and in the lower abdomen    Assessment:    Abdominal pain, likely secondary to gerd and IBS .    Plan:   refer to [place] diagnosis was discussed with the patient and evaluation and treatment plans outlined. See orders for lab and imaging studies. Adhere to simple, bland diet. Initiate empiric trial of acid suppression; see orders. Initiate trial of anticholinergic medications for IBS. Further follow-up plans will be based on outcome of lab/imaging studies; see orders. Follow up as needed. refer to GI   Pain subsided with GI cocktail

## 2014-01-15 NOTE — Patient Instructions (Addendum)
Food Choices for Gastroesophageal Reflux Disease When you have gastroesophageal reflux disease (GERD), the foods you eat and your eating habits are very important. Choosing the right foods can help ease the discomfort of GERD. WHAT GENERAL GUIDELINES DO I NEED TO FOLLOW?  Choose fruits, vegetables, whole grains, low-fat dairy products, and low-fat meat, fish, and poultry.  Limit fats such as oils, salad dressings, butter, nuts, and avocado.  Keep a food diary to identify foods that cause symptoms.  Avoid foods that cause reflux. These may be different for different people.  Eat frequent small meals instead of three large meals each day.  Eat your meals slowly, in a relaxed setting.  Limit fried foods.  Cook foods using methods other than frying.  Avoid drinking alcohol.  Avoid drinking large amounts of liquids with your meals.  Avoid bending over or lying down until 2-3 hours after eating. WHAT FOODS ARE NOT RECOMMENDED? The following are some foods and drinks that may worsen your symptoms: Vegetables Tomatoes. Tomato juice. Tomato and spaghetti sauce. Chili peppers. Onion and garlic. Horseradish. Fruits Oranges, grapefruit, and lemon (fruit and juice). Meats High-fat meats, fish, and poultry. This includes hot dogs, ribs, ham, sausage, salami, and bacon. Dairy Whole milk and chocolate milk. Sour cream. Cream. Butter. Ice cream. Cream cheese.  Beverages Coffee and tea, with or without caffeine. Carbonated beverages or energy drinks. Condiments Hot sauce. Barbecue sauce.  Sweets/Desserts Chocolate and cocoa. Donuts. Peppermint and spearmint. Fats and Oils High-fat foods, including [place] fries and potato chips. Other Vinegar. Strong spices, such as black pepper, white pepper, red pepper, cayenne, curry powder, cloves, ginger, and chili powder. The items listed above may not be a complete list of foods and beverages to avoid. Contact your dietitian for more  information. Document Released: 01/05/2005 Document Revised: 01/10/2013 Document Reviewed: 11/09/2012 [facility] Patient Information 2015 [place], [place]. This information is not intended to replace advice given to you by your health care provider. Make sure you discuss any questions you have with your health care provider.  Irritable Bowel Syndrome Irritable bowel syndrome (IBS) is caused by a disturbance of normal bowel function and is a common digestive disorder. You may also hear this condition called spastic colon, mucous colitis, and irritable colon. There is no cure for IBS. However, symptoms often gradually improve or disappear with a good diet, stress management, and medicine. This condition usually appears in late adolescence or early adulthood. Women develop it twice as often as men. CAUSES  After food has been digested and absorbed in the small intestine, waste material is moved into the large intestine, or colon. In the colon, water and salts are absorbed from the undigested products coming from the small intestine. The remaining residue, or fecal material, is held for elimination. Under normal circumstances, gentle, rhythmic contractions of the bowel walls push the fecal material along the colon toward the rectum. In IBS, however, these contractions are irregular and poorly coordinated. The fecal material is either retained too long, resulting in constipation, or expelled too soon, producing diarrhea. SIGNS AND SYMPTOMS  The most common symptom of IBS is abdominal pain. It is often in the lower left side of the abdomen, but it may occur anywhere in the abdomen. The pain comes from spasms of the bowel muscles happening too much and from the buildup of gas and fecal material in the colon. This pain:  Can range from sharp abdominal cramps to a dull, continuous ache.  Often worsens soon after eating.  Is often  relieved by having a bowel movement or passing gas. Abdominal pain is usually  accompanied by constipation, but it may also produce diarrhea. The diarrhea often occurs right after a meal or upon waking up in the morning. The stools are often soft, watery, and flecked with mucus. Other symptoms of IBS include:  Bloating.  Loss of appetite.  Heartburn.  Backache.  Dull pain in the arms or shoulders.  Nausea.  Burping.  Vomiting.  Gas. IBS may also cause symptoms that are unrelated to the digestive system, such as:  Fatigue.  Headaches.  Anxiety.  Shortness of breath.  Trouble concentrating.  Dizziness. These symptoms tend to come and go. DIAGNOSIS  The symptoms of IBS may seem like symptoms of other, more serious digestive disorders. Your health care provider may want to perform tests to exclude these disorders.  TREATMENT Many medicines are available to help correct bowel function or relieve bowel spasms and abdominal pain. Among the medicines available are:  Laxatives for severe constipation and to help restore normal bowel habits.  Specific antidiarrheal medicines to treat severe or lasting diarrhea.  Antispasmodic agents to relieve intestinal cramps. Your health care provider may also decide to treat you with a mild tranquilizer or sedative during unusually stressful periods in your life. Your health care provider may also prescribe antidepressant medicine. The use of this medicine has been shown to reduce pain and other symptoms of IBS. Remember that if any medicine is prescribed for you, you should take it exactly as directed. Make sure your health care provider knows how well it worked for you. HOME CARE INSTRUCTIONS   Take all medicines as directed by your health care provider.  Avoid foods that are high in fat or oils, such as heavy cream, butter, frankfurters, sausage, and other fatty meats.  Avoid foods that make you go to the bathroom, such as fruit, fruit juice, and dairy products.  Cut out carbonated drinks, chewing gum, and  "gassy" foods such as beans and cabbage. This may help relieve bloating and burping.  Eat foods with bran, and drink plenty of liquids with the bran foods. This helps relieve constipation.  Keep track of what foods seem to bring on your symptoms.  Avoid emotionally charged situations or circumstances that produce anxiety.  Start or continue exercising.  Get plenty of rest and sleep. Document Released: 01/05/2005 Document Revised: 01/10/2013 Document Reviewed: 08/26/2007 [facility] Patient Information 2015 [place], [place]. This information is not intended to replace advice given to you by your health care provider. Make sure you discuss any questions you have with your health care provider.

## 2014-01-15 NOTE — Progress Notes (Signed)
Pre visit review using our clinic review tool, if applicable. No additional management support is needed unless otherwise documented below in the visit note. 

## 2014-01-16 DIAGNOSIS — Z85820 Personal history of malignant melanoma of skin: Secondary | ICD-10-CM | POA: Diagnosis not present

## 2014-01-16 DIAGNOSIS — D225 Melanocytic nevi of trunk: Secondary | ICD-10-CM | POA: Diagnosis not present

## 2014-01-16 DIAGNOSIS — D2372 Other benign neoplasm of skin of left lower limb, including hip: Secondary | ICD-10-CM | POA: Diagnosis not present

## 2014-01-16 DIAGNOSIS — D2271 Melanocytic nevi of right lower limb, including hip: Secondary | ICD-10-CM | POA: Diagnosis not present

## 2014-01-16 DIAGNOSIS — L812 Freckles: Secondary | ICD-10-CM | POA: Diagnosis not present

## 2014-01-16 DIAGNOSIS — D1801 Hemangioma of skin and subcutaneous tissue: Secondary | ICD-10-CM | POA: Diagnosis not present

## 2014-01-16 DIAGNOSIS — L821 Other seborrheic keratosis: Secondary | ICD-10-CM | POA: Diagnosis not present

## 2014-01-16 LAB — H. PYLORI ANTIBODY, IGG: H Pylori IgG: NEGATIVE

## 2014-01-16 LAB — BASIC METABOLIC PANEL
BUN: 23 mg/dL (ref 6–23)
CO2: 32 mEq/L (ref 19–32)
Calcium: 9.6 mg/dL (ref 8.4–10.5)
Chloride: 104 mEq/L (ref 96–112)
Creatinine, Ser: 0.8 mg/dL (ref 0.4–1.2)
GFR: 72.66 mL/min (ref >60.00)
Glucose, Bld: 151 mg/dL — ABNORMAL HIGH (ref 70–99)
Potassium: 4.3 mEq/L (ref 3.5–5.1)
Sodium: 141 mEq/L (ref 135–145)

## 2014-01-16 LAB — HEPATIC FUNCTION PANEL
ALT: 19 U/L (ref 0–35)
AST: 21 U/L (ref 0–37)
Albumin: 4 g/dL (ref 3.5–5.2)
Alkaline Phosphatase: 73 U/L (ref 39–117)
Bilirubin, Direct: 0 mg/dL (ref 0.0–0.3)
Total Bilirubin: 0.2 mg/dL (ref 0.2–1.2)
Total Protein: 6.9 g/dL (ref 6.0–8.3)

## 2014-01-16 LAB — CBC WITH DIFFERENTIAL/PLATELET
Basophils Absolute: 0 10*3/uL (ref 0.0–0.1)
Basophils Relative: 0.4 % (ref 0.0–3.0)
Eosinophils Absolute: 0.1 10*3/uL (ref 0.0–0.7)
Eosinophils Relative: 1 % (ref 0.0–5.0)
HCT: 40.3 % (ref 36.0–46.0)
Hemoglobin: 13.2 g/dL (ref 12.0–15.0)
Lymphocytes Relative: 22 % (ref 12.0–46.0)
Lymphs Abs: 1.3 10*3/uL (ref 0.7–4.0)
MCHC: 32.7 g/dL (ref 30.0–36.0)
MCV: 93 fl (ref 78.0–100.0)
Monocytes Absolute: 0.6 10*3/uL (ref 0.1–1.0)
Monocytes Relative: 9.7 % (ref 3.0–12.0)
Neutro Abs: 4 10*3/uL (ref 1.4–7.7)
Neutrophils Relative %: 66.9 % (ref 43.0–77.0)
Platelets: 206 10*3/uL (ref 150.0–400.0)
RBC: 4.34 Mil/uL (ref 3.87–5.11)
RDW: 12.3 % (ref 11.5–15.5)
WBC: 6 10*3/uL (ref 4.0–10.5)

## 2014-01-29 ENCOUNTER — Encounter: Payer: Self-pay | Admitting: Gastroenterology

## 2014-01-29 ENCOUNTER — Ambulatory Visit (INDEPENDENT_AMBULATORY_CARE_PROVIDER_SITE_OTHER): Payer: Medicare Other | Admitting: Gastroenterology

## 2014-01-29 VITALS — BP 124/84 | HR 76 | Wt 140.2 lb

## 2014-01-29 DIAGNOSIS — R490 Dysphonia: Secondary | ICD-10-CM | POA: Diagnosis not present

## 2014-01-29 DIAGNOSIS — K219 Gastro-esophageal reflux disease without esophagitis: Secondary | ICD-10-CM | POA: Diagnosis not present

## 2014-01-29 DIAGNOSIS — R1013 Epigastric pain: Secondary | ICD-10-CM

## 2014-01-29 DIAGNOSIS — Z8719 Personal history of other diseases of the digestive system: Secondary | ICD-10-CM | POA: Insufficient documentation

## 2014-01-29 DIAGNOSIS — R1310 Dysphagia, unspecified: Secondary | ICD-10-CM | POA: Diagnosis not present

## 2014-01-29 DIAGNOSIS — G8929 Other chronic pain: Secondary | ICD-10-CM | POA: Diagnosis not present

## 2014-01-29 MED ORDER — OMEPRAZOLE 40 MG PO CPDR: 40.0000 mg | DELAYED_RELEASE_CAPSULE | Freq: Every day | ORAL | Status: DC

## 2014-01-29 NOTE — Progress Notes (Signed)
01/29/2014 [name] [name] [id number] [birth year]   History of Present Illness:  This is a 69 year old female who is known to Dr. Deatra Ina for issues with GERD and esophageal stricture.  Her last EGD was in January 2014 at which time she had an esophageal stricture that was dilated.  She was placed on Zegerid BID and her reflux had been doing ok with that medication.  In fact, she felt very well after a while and discontinued her PPI altogether.  Now, she presents to our office today with complaints of epigastric abdominal pain, hoarseness, and dysphagia again.  She actually went to the ED on Christmas night after waking up with abdominal pain that began in her epigastrium, but radiated throughout her entire abdomen.  Due to her history of previous abdominal surgeries a CT scan of the abdomen and pelvis with contrast was performed that showed no acute abnormalities to explain her symptoms.  CBC, CMP, lipase, and urinalysis were all unremarkable except for an elevated blood sugar. Repeat labs upon follow-up with her PCP on December 28 also revealed an unremarkable hepatic function panel, CBC, BMP, and negative H. Pylori antibody IgG. Since she had not been taking her PPI she was placed on omeprazole 20 mg daily. She had been given a GI cocktail both in the ER and at her PCPs office, which provided some relief of her symptoms. She also describes recurrent dysphagia. She says that breakfast seems to go down okay because she usually eats yogurt and other soft foods, however, when she eats lunch and dinner those seem to get stuck and she has to drink a lot of water to get them to go down. She also complains of a lot of hoarseness that has been present for about the past week or so.  She complains of what sounds like possibly esophageal spasm and was given a prescription for Bentyl in the ED; she has taken this once or twice when she gets this sensation and it has seemed to help; complains of episodes of  tightening the start in her epigastrium and go up into her chest like she is going to choke.   Current Medications, Allergies, Past Medical History, Past Surgical History, Family History and Social History were reviewed in [employer] record.   Physical Exam: BP 124/84 mmHg  Pulse 76  Wt 140 lb 3.2 oz (63.594 kg) General: Well developed white female in no acute distress Head: Normocephalic and atraumatic Eyes:  Sclerae anicteric, conjunctiva pink  Ears: Normal auditory acuity Lungs: Clear throughout to auscultation Heart: Regular rate and rhythm Abdomen: Soft, non-distended.  Normal bowel sounds.  Scar from previous laparotomy noted.  Epigastric TTP without R/R/G. Musculoskeletal: Symmetrical with no gross deformities  Extremities: No edema  Neurological: Alert oriented x 4, grossly non-focal Psychological:  Alert and cooperative. Normal mood and affect  Assessment and Recommendations: -Epigastric abdominal pain with GERD, hoarseness, and dysphagia in a patient with history of esophageal stricture:  Had not been taking PPI.  Is now on omeprazole 20 mg daily, but will increase that to 40 mg daily.  Will schedule for EGD with possible dilation with Dr. Deatra Ina as well.  The risks, benefits, and alternatives were discussed with the patient and she consents to proceed.  It sounds like that she may also be having esophageal spasms, but have been relieved by Bentyl so she can continue that prn as well; the spasms may just be from esophageal irritation, etc.

## 2014-01-29 NOTE — Patient Instructions (Signed)
You have been scheduled for an endoscopy. Please follow written instructions given to you at your visit today. If you use inhalers (even only as needed), please bring them with you on the day of your procedure. Your physician has requested that you go to www.startemmi.com and enter the access code given to you at your visit today. This web site gives a general overview about your procedure. However, you should still follow specific instructions given to you by our office regarding your preparation for the procedure.  Your prescription was sent to your pharmacy

## 2014-01-30 NOTE — Progress Notes (Signed)
Reviewed and agree with management. Sharline Lehane D. Noboru Bidinger, M.D., FACG  

## 2014-02-03 ENCOUNTER — Telehealth: Payer: Self-pay | Admitting: Internal Medicine

## 2014-02-05 MED ORDER — ALPRAZOLAM 0.5 MG PO TABS: ORAL_TABLET | ORAL | Status: DC

## 2014-02-05 NOTE — Telephone Encounter (Signed)
Pt is requesting refill on Alprazolam.  Last OV: 02/07/2013 Last Fill: 11/15/2013 # [id number] UDS: 07/26/2013 Low risk  Please advise.

## 2014-02-05 NOTE — Telephone Encounter (Signed)
Faxed to [facility]

## 2014-02-05 NOTE — Telephone Encounter (Signed)
done

## 2014-02-12 ENCOUNTER — Encounter: Payer: Self-pay | Admitting: Gastroenterology

## 2014-02-12 ENCOUNTER — Ambulatory Visit (AMBULATORY_SURGERY_CENTER): Payer: Medicare Other | Admitting: Gastroenterology

## 2014-02-12 VITALS — BP 116/70 | HR 68 | Temp 98.3°F | Resp 15 | Ht 64.0 in | Wt 140.0 lb

## 2014-02-12 DIAGNOSIS — R1310 Dysphagia, unspecified: Secondary | ICD-10-CM | POA: Diagnosis not present

## 2014-02-12 DIAGNOSIS — K222 Esophageal obstruction: Secondary | ICD-10-CM

## 2014-02-12 DIAGNOSIS — E039 Hypothyroidism, unspecified: Secondary | ICD-10-CM | POA: Diagnosis not present

## 2014-02-12 LAB — GLUCOSE, CAPILLARY
Glucose-Capillary: 128 mg/dL — ABNORMAL HIGH (ref 70–99)
Glucose-Capillary: 129 mg/dL — ABNORMAL HIGH (ref 70–99)

## 2014-02-12 MED ORDER — SODIUM CHLORIDE 0.9 % IV SOLN: 500.0000 mL | INTRAVENOUS | Status: DC

## 2014-02-12 NOTE — Progress Notes (Signed)
Procedure ends, to recovery, report given and VSS. 

## 2014-02-12 NOTE — Patient Instructions (Signed)
YOU HAD AN ENDOSCOPIC PROCEDURE TODAY AT [facility]: Refer to the procedure report that was given to you for any specific questions about what was found during the examination.  If the procedure report does not answer your questions, please call your gastroenterologist to clarify.  If you requested that your care partner not be given the details of your procedure findings, then the procedure report has been included in a sealed envelope for you to review at your convenience later.  YOU SHOULD EXPECT: Some feelings of bloating in the abdomen. Passage of more gas than usual.  Walking can help get rid of the air that was put into your GI tract during the procedure and reduce the bloating. If you had a lower endoscopy (such as a colonoscopy or flexible sigmoidoscopy) you may notice spotting of blood in your stool or on the toilet paper. If you underwent a bowel prep for your procedure, then you may not have a normal bowel movement for a few days.  DIET: Dilation Diet today, see handout. Nothing by mouth until 9:50 am, then clear liquids for 1 hour, then soft foods rest of today. Resume regular diet tomorrow. Drink plenty of fluids but you should avoid alcoholic beverages for 24 hours.  ACTIVITY: Your care partner should take you home directly after the procedure.  You should plan to take it easy, moving slowly for the rest of the day.  You can resume normal activity the day after the procedure however you should NOT DRIVE or use heavy machinery for 24 hours (because of the sedation medicines used during the test).    SYMPTOMS TO REPORT IMMEDIATELY: A gastroenterologist can be reached at any hour.  During normal business hours, 8:30 AM to 5:00 PM Monday through Friday, call [phone].  After hours and on weekends, please call the GI answering service at [phone] who will take a message and have the physician on call contact you.   Following lower endoscopy (colonoscopy or flexible  sigmoidoscopy):  Excessive amounts of blood in the stool  Significant tenderness or worsening of abdominal pains  Swelling of the abdomen that is new, acute  Fever of 100F or higher  Following upper endoscopy (EGD)  Vomiting of blood or coffee ground material  New chest pain or pain under the shoulder blades  Painful or persistently difficult swallowing  New shortness of breath  Fever of 100F or higher  Black, tarry-looking stools  FOLLOW UP: If any biopsies were taken you will be contacted by phone or by letter within the next 1-3 weeks.  Call your gastroenterologist if you have not heard about the biopsies in 3 weeks.  Our staff will call the home number listed on your records the next business day following your procedure to check on you and address any questions or concerns that you may have at that time regarding the information given to you following your procedure. This is a courtesy call and so if there is no answer at the home number and we have not heard from you through the emergency physician on call, we will assume that you have returned to your regular daily activities without incident.  SIGNATURES/CONFIDENTIALITY: You and/or your care partner have signed paperwork which will be entered into your electronic medical record.  These signatures attest to the fact that that the information above on your After Visit Summary has been reviewed and is understood.  Full responsibility of the confidentiality of this discharge information lies with you  and/or your care-partner. 

## 2014-02-12 NOTE — Progress Notes (Signed)
Called to room to assist during endoscopic procedure.  Patient ID and intended procedure confirmed with present staff. Received instructions for my participation in the procedure from the performing physician.  

## 2014-02-12 NOTE — Op Note (Signed)
[facility]  [address] [place], [address]   ENDOSCOPY PROCEDURE REPORT  PATIENT: [name], [name]  MR#: [id number] BIRTHDATE: 09/13/1945 , 68  yrs. old GENDER: female ENDOSCOPIST: Inda Castle, MD REFERRED BY:  Kathlene November, M.D. PROCEDURE DATE:  02/12/2014 PROCEDURE:  EGD, diagnostic and Maloney dilation of esophagus ASA CLASS:     Class II INDICATIONS:  dysphagia and history of esophageal stricture. MEDICATIONS: Monitored anesthesia care and Propofol 140 mg IV TOPICAL ANESTHETIC:  DESCRIPTION OF PROCEDURE: After the risks benefits and alternatives of the procedure were thoroughly explained, informed consent was obtained.  The LB [id number] endoscope was introduced through the mouth and advanced to the second portion of the duodenum , Without limitations.  The instrument was slowly withdrawn as the mucosa was fully examined.    ESOPHAGUS: There was a peptic stricture at the gastroesophageal junction.  The stricture was easily traversable.  The stricture was dilated using a 73mm (52Fr) Maloney dilator.   Except for the findings listed the EGD was otherwise normal.  Retroflexed views revealed no abnormalities.     The scope was then withdrawn from the patient and the procedure completed.  COMPLICATIONS: There were no immediate complications.  ENDOSCOPIC IMPRESSION: 1.   There was a stricture at the gastroesophageal junction; The stricture was dilated using a 65mm (52Fr) Maloney dilator 2.   EGD was otherwise normal  RECOMMENDATIONS: Continue PPI repeat dilation as needed  REPEAT EXAM:  eSigned:  Inda Castle, MD 02/12/2014 8:49 AM    CC:

## 2014-02-13 ENCOUNTER — Telehealth: Payer: Self-pay

## 2014-02-13 ENCOUNTER — Other Ambulatory Visit: Payer: Self-pay | Admitting: Internal Medicine

## 2014-02-13 NOTE — Telephone Encounter (Signed)
  Follow up Call-  Call back number 02/12/2014 01/26/2012 10/21/2011  Post procedure Call Back phone  # [phone]  Permission to leave phone message Yes Yes Yes     Patient questions:  Do you have a fever, pain , or abdominal swelling? No. Pain Score  0 *  Have you tolerated food without any problems? Yes.    Have you been able to return to your normal activities? Yes.    Do you have any questions about your discharge instructions: Diet   No. Medications  No. Follow up visit  No.  Do you have questions or concerns about your Care? No.  Actions: * If pain score is 4 or above: No action needed, pain <4.

## 2014-02-13 NOTE — Telephone Encounter (Signed)
Patient requesting Rx for Crestor. Last refilled 08/28/13 for 90 with 1.  Last lipid panel 03/17/12.  Please advise. eal

## 2014-02-13 NOTE — Telephone Encounter (Signed)
Yes

## 2014-02-13 NOTE — Telephone Encounter (Signed)
I am covering for Dr. Larose Kells. Looks like she is due for her annual medicare wellness visit with Dr. Larose Kells. She has not seen him in 1 year. OK to to send 30 day supply no refills.

## 2014-02-13 NOTE — Telephone Encounter (Signed)
Pt is requesting 90 day supply. Is it ok to give #90 x no refills and scheduled wellness exam?

## 2014-02-13 NOTE — Telephone Encounter (Signed)
Please call pt to arrange wellness exam with Dr Larose Kells.

## 2014-02-15 NOTE — Telephone Encounter (Signed)
Left detailed message about medication refill and to call our office to schedule wellness exam.

## 2014-03-06 DIAGNOSIS — H2513 Age-related nuclear cataract, bilateral: Secondary | ICD-10-CM | POA: Diagnosis not present

## 2014-03-06 DIAGNOSIS — H04123 Dry eye syndrome of bilateral lacrimal glands: Secondary | ICD-10-CM | POA: Diagnosis not present

## 2014-03-06 DIAGNOSIS — H0289 Other specified disorders of eyelid: Secondary | ICD-10-CM | POA: Diagnosis not present

## 2014-03-06 DIAGNOSIS — E119 Type 2 diabetes mellitus without complications: Secondary | ICD-10-CM | POA: Diagnosis not present

## 2014-03-12 DIAGNOSIS — E118 Type 2 diabetes mellitus with unspecified complications: Secondary | ICD-10-CM | POA: Diagnosis not present

## 2014-03-12 DIAGNOSIS — E119 Type 2 diabetes mellitus without complications: Secondary | ICD-10-CM | POA: Diagnosis not present

## 2014-03-12 DIAGNOSIS — E039 Hypothyroidism, unspecified: Secondary | ICD-10-CM | POA: Diagnosis not present

## 2014-03-12 DIAGNOSIS — E780 Pure hypercholesterolemia: Secondary | ICD-10-CM | POA: Diagnosis not present

## 2014-03-12 LAB — LIPID PANEL
Cholesterol: 179 mg/dL (ref 0–200)
Cholesterol: 179 mg/dL (ref 0–200)
HDL: 51 mg/dL (ref 35–70)
HDL: 51 mg/dL (ref 35–70)
LDL Cholesterol: 89 mg/dL
LDL Cholesterol: 89 mg/dL
LDl/HDL Ratio: 1.7
Triglycerides: 197 mg/dL — AB (ref 40–160)
Triglycerides: 197 mg/dL — AB (ref 40–160)

## 2014-03-12 LAB — BASIC METABOLIC PANEL
BUN: 27 mg/dL — AB (ref 4–21)
Creatinine: 1 mg/dL (ref <=1.1)
Glucose: 159 mg/dL
Potassium: 4.8 mmol/L (ref 3.4–5.3)
Potassium: 4.8 mmol/L (ref 3.4–5.3)
Sodium: 143 mmol/L (ref 137–147)
Sodium: 143 mmol/L (ref 137–147)

## 2014-03-12 LAB — TSH
TSH: 3.65 u[IU]/mL (ref <=5.90)
TSH: 3.65 u[IU]/mL (ref <=5.90)

## 2014-03-12 LAB — HEPATIC FUNCTION PANEL
ALT: 30 U/L (ref 7–35)
AST: 26 U/L (ref 13–35)
Alkaline Phosphatase: 105 U/L (ref 25–125)

## 2014-03-12 LAB — HEMOGLOBIN A1C: Hgb A1c MFr Bld: 7.1 % — AB (ref 4.0–6.0)

## 2014-03-19 ENCOUNTER — Other Ambulatory Visit: Payer: Self-pay | Admitting: Endocrinology

## 2014-03-19 DIAGNOSIS — E049 Nontoxic goiter, unspecified: Secondary | ICD-10-CM

## 2014-03-19 DIAGNOSIS — N189 Chronic kidney disease, unspecified: Secondary | ICD-10-CM | POA: Diagnosis not present

## 2014-03-19 DIAGNOSIS — E1142 Type 2 diabetes mellitus with diabetic polyneuropathy: Secondary | ICD-10-CM | POA: Diagnosis not present

## 2014-03-19 DIAGNOSIS — G609 Hereditary and idiopathic neuropathy, unspecified: Secondary | ICD-10-CM | POA: Diagnosis not present

## 2014-03-19 DIAGNOSIS — E039 Hypothyroidism, unspecified: Secondary | ICD-10-CM | POA: Diagnosis not present

## 2014-03-23 ENCOUNTER — Other Ambulatory Visit: Payer: Medicare Other

## 2014-04-05 ENCOUNTER — Telehealth: Payer: Self-pay | Admitting: *Deleted

## 2014-04-05 ENCOUNTER — Encounter: Payer: Self-pay | Admitting: *Deleted

## 2014-04-05 NOTE — Telephone Encounter (Signed)
Pre-Visit Call completed with patient and chart updated.   Pre-Visit Info documented in Specialty Comments under [employer].    

## 2014-04-06 ENCOUNTER — Encounter: Payer: Self-pay | Admitting: Internal Medicine

## 2014-04-06 ENCOUNTER — Ambulatory Visit (INDEPENDENT_AMBULATORY_CARE_PROVIDER_SITE_OTHER): Payer: Medicare Other | Admitting: Internal Medicine

## 2014-04-06 ENCOUNTER — Other Ambulatory Visit: Payer: Self-pay

## 2014-04-06 VITALS — BP 118/64 | HR 78 | Temp 98.2°F | Ht 64.0 in | Wt 138.5 lb

## 2014-04-06 DIAGNOSIS — Z0000 Encounter for general adult medical examination without abnormal findings: Secondary | ICD-10-CM

## 2014-04-06 DIAGNOSIS — Z23 Encounter for immunization: Secondary | ICD-10-CM

## 2014-04-06 DIAGNOSIS — F411 Generalized anxiety disorder: Secondary | ICD-10-CM

## 2014-04-06 DIAGNOSIS — Z79899 Other long term (current) drug therapy: Secondary | ICD-10-CM | POA: Diagnosis not present

## 2014-04-06 MED ORDER — ROSUVASTATIN CALCIUM 10 MG PO TABS: 10.0000 mg | ORAL_TABLET | Freq: Every day | ORAL | Status: DC

## 2014-04-06 MED ORDER — OMEPRAZOLE 20 MG PO CPDR: 20.0000 mg | DELAYED_RELEASE_CAPSULE | Freq: Every day | ORAL | Status: DC

## 2014-04-06 NOTE — Patient Instructions (Signed)
Please go to the lab for a UDS  If the concerns about your kidney remain, please come back for a checkup  Come back to the office in 1 year  for a physical exam  Please schedule an appointment at the front desk    Come back fasting         Fall Prevention and [place] cause injuries and can affect all age groups. It is possible to use preventive measures to significantly decrease the likelihood of falls. There are many simple measures which can make your home safer and prevent falls. OUTDOORS  Repair cracks and edges of walkways and driveways.  Remove high doorway thresholds.  Trim shrubbery on the main path into your home.  Have good outside lighting.  Clear walkways of tools, rocks, debris, and clutter.  Check that handrails are not broken and are securely fastened. Both sides of steps should have handrails.  Have leaves, snow, and ice cleared regularly.  Use sand or salt on walkways during winter months.  In the garage, clean up grease or oil spills. BATHROOM  Install night lights.  Install grab bars by the toilet and in the tub and shower.  Use non-skid mats or decals in the tub or shower.  Place a plastic non-slip stool in the shower to sit on, if needed.  Keep floors dry and clean up all water on the floor immediately.  Remove soap buildup in the tub or shower on a regular basis.  Secure bath mats with non-slip, double-sided rug tape.  Remove throw rugs and tripping hazards from the floors. BEDROOMS  Install night lights.  Make sure a bedside light is easy to reach.  Do not use oversized bedding.  Keep a telephone by your bedside.  Have a firm chair with side arms to use for getting dressed.  Remove throw rugs and tripping hazards from the floor. KITCHEN  Keep handles on pots and pans turned toward the center of the stove. Use back burners when possible.  Clean up spills quickly and allow time for drying.  Avoid walking on wet  floors.  Avoid hot utensils and knives.  Position shelves so they are not too high or low.  Place commonly used objects within easy reach.  If necessary, use a sturdy step stool with a grab bar when reaching.  Keep electrical cables out of the way.  Do not use floor polish or wax that makes floors slippery. If you must use wax, use non-skid floor wax.  Remove throw rugs and tripping hazards from the floor. STAIRWAYS  Never leave objects on stairs.  Place handrails on both sides of stairways and use them. Fix any loose handrails. Make sure handrails on both sides of the stairways are as long as the stairs.  Check carpeting to make sure it is firmly attached along stairs. Make repairs to worn or loose carpet promptly.  Avoid placing throw rugs at the top or bottom of stairways, or properly secure the rug with carpet tape to prevent slippage. Get rid of throw rugs, if possible.  Have an electrician put in a light switch at the top and bottom of the stairs. OTHER FALL PREVENTION TIPS  Wear low-heel or rubber-soled shoes that are supportive and fit well. Wear closed toe shoes.  When using a stepladder, make sure it is fully opened and both spreaders are firmly locked. Do not climb a closed stepladder.  Add color or contrast paint or tape to grab bars and handrails  in your home. Place contrasting color strips on first and last steps.  Learn and use mobility aids as needed. Install an electrical emergency response system.  Turn on lights to avoid dark areas. Replace light bulbs that burn out immediately. Get light switches that glow.  Arrange furniture to create clear pathways. Keep furniture in the same place.  Firmly attach carpet with non-skid or double-sided tape.  Eliminate uneven floor surfaces.  Select a carpet pattern that does not visually hide the edge of steps.  Be aware of all pets. OTHER HOME SAFETY TIPS  Set the water temperature for 120 F (48.8 C).  Keep  emergency numbers on or near the telephone.  Keep smoke detectors on every level of the home and near sleeping areas. Document Released: 12/26/2001 Document Revised: 07/07/2011 Document Reviewed: 03/27/2011 [facility] Patient Information 2015 [place], [place]. This information is not intended to replace advice given to you by your health care provider. Make sure you discuss any questions you have with your health care provider.   Preventive Care for Adults Ages 15 and over  Blood pressure check.** / Every 1 to 2 years.  Lipid and cholesterol check.**/ Every 5 years beginning at age 41.  Lung cancer screening. / Every year if you are aged 63-80 years and have a 30-pack-year history of smoking and currently smoke or have quit within the past 15 years. Yearly screening is stopped once you have quit smoking for at least 15 years or develop a health problem that would prevent you from having lung cancer treatment.  Fecal occult blood test (FOBT) of stool. / Every year beginning at age 4 and continuing until age 53. You may not have to do this test if you get a colonoscopy every 10 years.  Flexible sigmoidoscopy** or colonoscopy.** / Every 5 years for a flexible sigmoidoscopy or every 10 years for a colonoscopy beginning at age 6 and continuing until age 8.  Hepatitis C blood test.** / For all people born from [birth year] through 1965 and any individual with known risks for hepatitis C.  Abdominal aortic aneurysm (AAA) screening.** / A one-time screening for ages 17 to 93 years who are current or former smokers.  Skin self-exam. / Monthly.  Influenza vaccine. / Every year.  Tetanus, diphtheria, and acellular pertussis (Tdap/Td) vaccine.** / 1 dose of Td every 10 years.  Varicella vaccine.** / Consult your health care provider.  Zoster vaccine.** / 1 dose for adults aged 24 years or older.  Pneumococcal 13-valent conjugate (PCV13) vaccine.** / Consult your health care provider.  Pneumococcal  polysaccharide (PPSV23) vaccine.** / 1 dose for all adults aged 60 years and older.  Meningococcal vaccine.** / Consult your health care provider.  Hepatitis A vaccine.** / Consult your health care provider.  Hepatitis B vaccine.** / Consult your health care provider.  Haemophilus influenzae type b (Hib) vaccine.** / Consult your health care provider. **Family history and personal history of risk and conditions may change your health care provider's recommendations. Document Released: 03/03/2001 Document Revised: 01/10/2013 Document Reviewed: 06/02/2010 [facility] Patient Information 2015 [place], [place]. This information is not intended to replace advice given to you by your health care provider. Make sure you discuss any questions you have with your health care provider.

## 2014-04-06 NOTE — Assessment & Plan Note (Addendum)
Td 05 --and  today Pneumonia shot -- 2013 prevnar -- today shingles shot 2013   Female care Per gyn Dr Salome Holmes , states has regular PAPs, no records  MMG--07/20/13- neg  Bone Density--06/19/12 patient reported at [facility] [place] [facility]- Osteopenia   last [place] 2009, no polyps, flex sig 2013     doing great, continue with healthy habits

## 2014-04-06 NOTE — Progress Notes (Signed)
Pre visit review using our clinic review tool, if applicable. No additional management support is needed unless otherwise documented below in the visit note. 

## 2014-04-06 NOTE — Assessment & Plan Note (Signed)
Anxiety, insomnia, symptoms well-controlled, only taking Xanax at night, UDS 07-2013 low risk. Plan: Refill when necessary, check a UDS

## 2014-04-06 NOTE — Progress Notes (Signed)
Subjective:    Patient ID: [name] [name], female    DOB: 02-28-1945, 69 y.o.   MRN: [id number]  DOS:  04/06/2014 Type of visit - description :    Here for Medicare AWV:  1. Risk factors based on Past M, S, F history: Reviewed  2. Physical Activities: aerobics x 3 / week at least  3. Depression/mood: lost her mom ~ 2 years ago, felt sad x a while, doing ok now  4. Hearing: heaing aids work well for her  5. ADL's: totally independent  6. Fall Risk: low risk, very active, precautions discussed 7. Home Safety: does feel safe at home  8. Height, weight, &visual acuity:see Vs, uses glasses, has chronic conjunctivitis,enrolled in a [place] program 9. Counseling: yes  10. Labs ordered based on risk factors: yes  11. Referral Coordination, if needed  12. Care Plan, see a/p  13. Cognitive Assessment: motor skills, memory and cognition normal, actually seem above average for age  56. Care team updated 15. End of life care discussed , he already had that conversation w/ family   in addition we discussed the following Diabetes, follow-up by endocrinology GERD, esophageal stricture, recently saw GI s/p stretching, currently doing better. On Prilosec 20 mg daily. Also, her endocrinologist mention that "Prilosec is affecting her kidney", labs reviewed, creatinine is 1.0. I don't see a issue at this point, continue with Prilosec for now Insomnia, Xanax works okay. Trigeminal neuralgia, symptoms well-controlled Hypothyroidism, follow-up by endocrinology.  Labs from endocrinology 03/12/2014 AST ALT normal Total cholesterol 179, triglycerides 197, LDL 89 Creatinine 1.0, potassium 4.8 A1c 7.1 Labs done at the hospital December 2015 reviewed      Family History:  Colon Cancer:no  Prostate Cancer--F  breast ca--no  Sister, endometrial Ca and Clear cell carcinoma  melanoma--uncle  Diabetes-- no  MI-- GF in his 50s  Stroke-- GM     Review of Systems Constitutional:  No fever, chills. No unexplained wt changes. No unusual sweats HEENT: No dental problems, ear discharge, facial swelling, voice changes. No eye discharge, redness or intolerance to light Respiratory: No wheezing or difficulty breathing. No cough , mucus production Cardiovascular: No CP, leg swelling or palpitations GI: no nausea, vomiting, diarrhea or abdominal pain.  No blood in the stools. No dysphagia   Endocrine: No polyphagia, polyuria or polydipsia GU: No dysuria, gross hematuria, difficulty urinating. No urinary urgency or frequency. Musculoskeletal: no  unusual aches or pains; many afternoons she has bilateral, right worse than left, ankle swelling, denies dyspnea on exertion. Skin: No change in the color of the skin, palor or rash Allergic, immunologic: No environmental allergies or food allergies Neurological: No dizziness or syncope. No headaches. No diplopia, slurred speech, motor deficits, facial numbness Hematological: No enlarged lymph nodes, easy bruising or bleeding Psychiatry: No suicidal ideas, hallucinations, behavior problems or confusion. No unusual/severe anxiety or depression.     Past Medical History  Diagnosis Date  . Hyperlipidemia   . Osteopenia   . Diverticulosis of colon   . GERD with stricture     s/p dilatations  . Abdominal pain     EGD and CTs neg, rx Amitriptiline (non ulcer dyspepsia) EGD 12/11.. s/p ballon dilitation  . Anxiety   . Solar lentigo     face; s/p excision 3/09. See Derm q 6 months  . Hypothyroidism      Dx 10-9 TSH 5.2 , then TSH normalized, eventually went up again 2012  . Trigeminal neuralgia  h/o   . Melanoma     h/o  . Diabetes mellitus     no meds, diet controlled  . Cervical spondylosis without myelopathy 11/30/2012  . History of Lyme disease   . Chronic conjunctivitis     Past Surgical History  Procedure Laterality Date  . Cervical fusion    . Cholecystectomy    . Abdominal hysterectomy    . Shoulder surgery        right  . Colon resection  1999    d/t diverticulitis  . Wrist surgery      removed melaoma, right  . Colon surgery    . Melanoma resection Right      Right Arm Surgery, R facial surgery (05-2012)   . Ptx s/t chest tube      History   Social History  . Marital Status: Married    Spouse Name: [name] [name]  . Number of Children: 2  . Years of Education: college   Occupational History  . retired    Social History Main Topics  . Smoking status: Former Smoker    Start date: 01/20/1976  . Smokeless tobacco: Never Used  . Alcohol Use: 2.4 oz/week    4 Glasses of wine per week  . Drug Use: No  . Sexual Activity: Not on file   Other Topics Concern  . Not on file   Social History Narrative   Patient is married with 2 children.   Patient is right handed.   Patient has college education.   Patient drinks decaff coffee.               Medication List       This list is accurate as of: 04/06/14 11:59 PM.  Always use your most recent med list.               ALPRAZolam 0.5 MG tablet  Commonly known as:  XANAX  TAKE 1 TABLET BY MOUTH AT BEDTIME AS NEEDED FOR SLEEP     amitriptyline 25 MG tablet  Commonly known as:  ELAVIL  Take 25 mg by mouth 2 (two) times daily.     erythromycin ophthalmic ointment  Place 1 application into both eyes 3 (three) times a week.     gabapentin 300 MG capsule  Commonly known as:  NEURONTIN  1 capsule in the morning, 2 in the evening     hyoscyamine 0.125 MG SL tablet  Commonly known as:  LEVSIN/SL  Place 1 tablet (0.125 mg total) under the tongue every 4 (four) hours as needed.     multivitamin tablet  Take 1 tablet by mouth daily.     omeprazole 20 MG capsule  Commonly known as:  PRILOSEC  Take 1 capsule (20 mg total) by mouth daily.     propranolol 10 MG tablet  Commonly known as:  INDERAL  TAKE 1 TABLET BY MOUTH TWICE DAILY FOR 1 WEEK, THEN 2 TABLETS TWICE DAILY     rosuvastatin 10 MG tablet  Commonly known as:  CRESTOR   Take 1 tablet (10 mg total) by mouth daily.     SYNTHROID 25 MCG tablet  Generic drug:  levothyroxine  Take 25 mcg by mouth daily. Friday, Saturday and Sunday-  56mcg     Vitamin D3 2000 UNITS Tabs  Take 1 tablet by mouth daily.           Objective:   Physical Exam BP 118/64 mmHg  Pulse 78  Temp(Src) 98.2 F (36.8 C) (  Oral)  Ht 5\' 4"  (1.626 m)  Wt 138 lb 8 oz (62.823 kg)  BMI 23.76 kg/m2  SpO2 95% General:   Well developed, well nourished . NAD.  Neck:  Full range of motion. Supple. No  thyromegaly , normal carotid pulse HEENT:  Normocephalic . Face symmetric, atraumatic Lungs:  CTA B Normal respiratory effort, no intercostal retractions, no accessory muscle use. Heart: RRR,  no murmur.  Abdomen:  Not distended, soft, non-tender. No rebound or rigidity. No mass,organomegaly Muscle skeletal: no pretibial edema bilaterally  Pedal pulses normal Skin: Exposed areas without rash. Not pale. Not jaundice Neurologic:  alert & oriented X3.  Speech normal, gait appropriate for age and unassisted Strength symmetric and appropriate for age.  Psych: Cognition and judgment appear intact.  Cooperative with normal attention span and concentration.  Behavior appropriate. No anxious or depressed appearing.        Assessment & Plan:

## 2014-04-08 ENCOUNTER — Encounter: Payer: Self-pay | Admitting: Internal Medicine

## 2014-04-09 ENCOUNTER — Other Ambulatory Visit: Payer: Self-pay

## 2014-04-09 ENCOUNTER — Encounter: Payer: Self-pay | Admitting: Internal Medicine

## 2014-04-09 MED ORDER — OMEPRAZOLE 20 MG PO CPDR: 20.0000 mg | DELAYED_RELEASE_CAPSULE | Freq: Every day | ORAL | Status: DC

## 2014-04-09 MED ORDER — ROSUVASTATIN CALCIUM 10 MG PO TABS
10.0000 mg | ORAL_TABLET | Freq: Every day | ORAL | Status: DC
Start: 2014-04-09 — End: 2015-04-11

## 2014-04-19 ENCOUNTER — Telehealth: Payer: Self-pay

## 2014-04-19 NOTE — Telephone Encounter (Signed)
UDS: 04/06/2014  Negative for Alprazolam: PRN Positive for Amitriptyline Positive for Gabapentin Positive for Nortriptyline  Low risk per Dr. Larose Kells 04/19/2014

## 2014-04-23 DIAGNOSIS — E119 Type 2 diabetes mellitus without complications: Secondary | ICD-10-CM | POA: Diagnosis not present

## 2014-04-23 DIAGNOSIS — H2513 Age-related nuclear cataract, bilateral: Secondary | ICD-10-CM | POA: Diagnosis not present

## 2014-04-23 DIAGNOSIS — H04123 Dry eye syndrome of bilateral lacrimal glands: Secondary | ICD-10-CM | POA: Diagnosis not present

## 2014-04-23 LAB — HM DIABETES EYE EXAM

## 2014-04-24 DIAGNOSIS — H01003 Unspecified blepharitis right eye, unspecified eyelid: Secondary | ICD-10-CM | POA: Diagnosis not present

## 2014-04-24 DIAGNOSIS — H04123 Dry eye syndrome of bilateral lacrimal glands: Secondary | ICD-10-CM | POA: Diagnosis not present

## 2014-04-24 DIAGNOSIS — H2513 Age-related nuclear cataract, bilateral: Secondary | ICD-10-CM | POA: Diagnosis not present

## 2014-04-24 DIAGNOSIS — H01006 Unspecified blepharitis left eye, unspecified eyelid: Secondary | ICD-10-CM | POA: Diagnosis not present

## 2014-05-23 ENCOUNTER — Other Ambulatory Visit: Payer: Self-pay | Admitting: Nurse Practitioner

## 2014-05-24 ENCOUNTER — Other Ambulatory Visit: Payer: Self-pay | Admitting: Neurology

## 2014-06-05 ENCOUNTER — Ambulatory Visit (INDEPENDENT_AMBULATORY_CARE_PROVIDER_SITE_OTHER): Payer: Medicare Other | Admitting: Neurology

## 2014-06-05 ENCOUNTER — Encounter: Payer: Self-pay | Admitting: Neurology

## 2014-06-05 VITALS — BP 120/79 | HR 79 | Ht 64.0 in | Wt 139.8 lb

## 2014-06-05 DIAGNOSIS — E538 Deficiency of other specified B group vitamins: Secondary | ICD-10-CM | POA: Diagnosis not present

## 2014-06-05 DIAGNOSIS — R413 Other amnesia: Secondary | ICD-10-CM | POA: Diagnosis not present

## 2014-06-05 DIAGNOSIS — M47812 Spondylosis without myelopathy or radiculopathy, cervical region: Secondary | ICD-10-CM | POA: Diagnosis not present

## 2014-06-05 DIAGNOSIS — G500 Trigeminal neuralgia: Secondary | ICD-10-CM

## 2014-06-05 DIAGNOSIS — G441 Vascular headache, not elsewhere classified: Secondary | ICD-10-CM

## 2014-06-05 HISTORY — DX: Other amnesia: R41.3

## 2014-06-05 MED ORDER — PROPRANOLOL HCL 10 MG PO TABS: ORAL_TABLET | ORAL | Status: DC

## 2014-06-05 MED ORDER — GABAPENTIN 300 MG PO CAPS: ORAL_CAPSULE | ORAL | Status: DC

## 2014-06-05 NOTE — Patient Instructions (Signed)
Trigeminal Neuralgia  Trigeminal neuralgia is a nerve disorder that causes sudden attacks of severe facial pain. It is caused by damage to the trigeminal nerve, a major nerve in the face. It is more common in women and in the elderly, although it can also happen in younger patients. Attacks last from a few seconds to several minutes and can occur from a couple of times per year to several times per day. Trigeminal neuralgia can be a very distressing and disabling condition. Surgery may be needed in very severe cases if medical treatment does not give relief.  HOME CARE INSTRUCTIONS    If your caregiver prescribed medication to help prevent attacks, take as directed.   To help prevent attacks:   Chew on the unaffected side of the mouth.   Avoid touching your face.   Avoid blasts of hot or cold air.   Men may wish to grow a beard to avoid having to shave.  SEEK IMMEDIATE MEDICAL CARE IF:   Pain is unbearable and your medicine does not help.   You develop new, unexplained symptoms (problems).   You have problems that may be related to a medication you are taking.  Document Released: 01/03/2000 Document Revised: 03/30/2011 Document Reviewed: 11/02/2008  [employer] Patient Information 2015 [employer]. This information is not intended to replace advice given to you by your health care provider. Make sure you discuss any questions you have with your health care provider.

## 2014-06-05 NOTE — Progress Notes (Signed)
Reason for visit: Trigeminal neuralgia  [name] [name] is an 69 y.o. female  History of present illness:  [name] [name] is a 69 year old right-handed white female with a history of left-sided V2 distribution trigeminal neuralgia and cervical spondylosis with cervicogenic headache. The patient has had ongoing issues with her neck, but she does get some improvement with the use of amitriptyline, gabapentin, and propranolol. The patient has tried to come down off of the amitriptyline previously, and she has reduce the dose to 25 mg at night. Coming off the medication results in increased pain. The patient is having dryness of the eyes, and she reports some troubles with memory on the amitriptyline. She also continues on the gabapentin. Coming off of this medication has also worsen the pain. Propranolol has also been beneficial in low dose. She has a history of a peripheral neuropathy, and the amitriptyline helps this as well. She denies any new issues other than the memory problems since last seen. She is having to make lists, and take notes more frequently.   Past Medical History  Diagnosis Date  . Hyperlipidemia   . Osteopenia   . Diverticulosis of colon   . GERD with stricture     s/p dilatations  . Abdominal pain     EGD and CTs neg, rx Amitriptiline (non ulcer dyspepsia) EGD 12/11.. s/p ballon dilitation  . Anxiety   . Solar lentigo     face; s/p excision 3/09. See Derm q 6 months  . Hypothyroidism      Dx 10-9 TSH 5.2 , then TSH normalized, eventually went up again 2012  . Trigeminal neuralgia     h/o   . Melanoma     h/o  . Diabetes mellitus     no meds, diet controlled  . Cervical spondylosis without myelopathy 11/30/2012  . History of Lyme disease   . Chronic conjunctivitis   . HOH (hard of hearing)     bilateral hearing aids  . Memory difficulties 06/05/2014    Past Surgical History  Procedure Laterality Date  . Cervical fusion    . Cholecystectomy    . Abdominal  hysterectomy    . Shoulder surgery      right  . Colon resection  1999    d/t diverticulitis  . Wrist surgery      removed melaoma, right  . Colon surgery    . Melanoma resection Right      Right Arm Surgery, R facial surgery (05-2012)   . Ptx s/t chest tube      Family History  Problem Relation Age of Onset  . Colon cancer Neg Hx   . Breast cancer Neg Hx   . Diabetes Neg Hx   . Stomach cancer Neg Hx   . Rectal cancer Neg Hx   . Prostate cancer Father   . Heart attack Paternal Grandfather     GF in his 34s  . Melanoma Maternal Uncle   . Endometrial cancer Sister     clear cell carcinoma    Social history:  reports that she has quit smoking. She started smoking about 38 years ago. She has never used smokeless tobacco. She reports that she drinks about 2.4 oz of alcohol per week. She reports that she does not use illicit drugs.    Allergies  Allergen Reactions  . Ceftriaxone Sodium Anaphylaxis  . Rocephin [Ceftriaxone]   . Benadryl [Diphenhydramine Hcl] Anxiety    Hyperactivity    Medications:  Prior to  Admission medications   Medication Sig Start Date End Date Taking? Authorizing Provider  ALPRAZolam (XANAX) 0.5 MG tablet TAKE 1 TABLET BY MOUTH AT BEDTIME AS NEEDED FOR SLEEP Patient taking differently: TAKE 1 TABLET BY MOUTH AT BEDTIME 02/05/14  Yes Colon Branch, MD  amitriptyline (ELAVIL) 25 MG tablet Take 25 mg by mouth 2 (two) times daily.   Yes Historical Provider, MD  Cholecalciferol (VITAMIN D3) 2000 UNITS TABS Take 1 tablet by mouth daily.    Yes Historical Provider, MD  gabapentin (NEURONTIN) 300 MG capsule 1 capsule in the morning, 2 in the evening 12/10/13  Yes Kathrynn Ducking, MD  hyoscyamine (LEVSIN/SL) 0.125 MG SL tablet Place 1 tablet (0.125 mg total) under the tongue every 4 (four) hours as needed. 01/15/14  Yes Rosalita Chessman, DO  levothyroxine (SYNTHROID) 25 MCG tablet Take 25 mcg by mouth daily. Friday, Saturday and Sunday-  88mcg   Yes Historical  Provider, MD  Multiple Vitamin (MULTIVITAMIN) tablet Take 1 tablet by mouth daily.     Yes Historical Provider, MD  omeprazole (PRILOSEC) 20 MG capsule Take 1 capsule (20 mg total) by mouth daily. 04/09/14  Yes Colon Branch, MD  propranolol (INDERAL) 10 MG tablet TAKE 1 TABLET BY MOUTH EVERY MORNING AND 2 TABLETS EVERY NIGHT 05/24/14  Yes Kathrynn Ducking, MD  rosuvastatin (CRESTOR) 10 MG tablet Take 1 tablet (10 mg total) by mouth daily. 04/09/14  Yes Colon Branch, MD    ROS:  Out of a complete 14 system review of symptoms, the patient complains only of the following symptoms, and all other reviewed systems are negative.  Memory problems Neck pain, joint pains  Blood pressure 120/79, pulse 79, height 5\' 4"  (1.626 m), weight 139 lb 12.8 oz (63.413 kg).  Physical Exam  General: The patient is alert and cooperative at the time of the examination.  Skin: No significant peripheral edema is noted.   Neurologic Exam  Mental status: The patient is alert and oriented x 3 at the time of the examination. The patient has apparent normal recent and remote memory, with an apparently normal attention span and concentration ability.   Cranial nerves: Facial symmetry is present. Speech is normal, no aphasia or dysarthria is noted. Extraocular movements are full. Visual fields are full.  Motor: The patient has good strength in all 4 extremities.  Sensory examination: Soft touch sensation is symmetric on the arms and legs. There is some decrease in soft touch sensation on the right face as compared to the left.  Coordination: The patient has good finger-nose-finger and heel-to-shin bilaterally.  Gait and station: The patient has a normal gait. Tandem gait is normal. Romberg is negative. No drift is seen.  Reflexes: Deep tendon reflexes are symmetric.   Assessment/Plan:  1. Trigeminal neuralgia, left V2 distribution  2. Cervical spondylosis  3. Cervicogenic headache  4. Peripheral  neuropathy  5. Reported memory disorder  The patient is doing relatively well with the neuralgia pain. She is having some problems with memory issues at this time that may be related to the combination of gabapentin and amitriptyline. We will need to follow this over time. The patient will have blood work done today looking for vitamin B12 levels. She will follow-up in 6 months. Prescriptions were given for the gabapentin and propranolol.  Jill Alexanders MD 06/05/2014 8:13 PM  [facility] [address] [place], [place] [address]  Phone [phone] Fax [phone]

## 2014-06-07 LAB — VITAMIN B12: Vitamin B-12: 853 pg/mL (ref 211–946)

## 2014-06-07 LAB — METHYLMALONIC ACID, SERUM: Methylmalonic Acid: 231 nmol/L (ref 0–378)

## 2014-06-07 LAB — COPPER, SERUM: Copper: 120 ug/dL (ref 72–166)

## 2014-06-08 ENCOUNTER — Encounter: Payer: Self-pay | Admitting: Gastroenterology

## 2014-06-08 DIAGNOSIS — Z01419 Encounter for gynecological examination (general) (routine) without abnormal findings: Secondary | ICD-10-CM | POA: Diagnosis not present

## 2014-06-08 DIAGNOSIS — Z6824 Body mass index (BMI) 24.0-24.9, adult: Secondary | ICD-10-CM | POA: Diagnosis not present

## 2014-06-12 DIAGNOSIS — E039 Hypothyroidism, unspecified: Secondary | ICD-10-CM | POA: Diagnosis not present

## 2014-06-12 DIAGNOSIS — E1142 Type 2 diabetes mellitus with diabetic polyneuropathy: Secondary | ICD-10-CM | POA: Diagnosis not present

## 2014-06-15 ENCOUNTER — Ambulatory Visit
Admission: RE | Admit: 2014-06-15 | Discharge: 2014-06-15 | Disposition: A | Discharge status: Home / Self Care | Payer: Medicare Other | Source: Ambulatory Visit | Attending: Endocrinology | Admitting: Endocrinology

## 2014-06-15 DIAGNOSIS — E049 Nontoxic goiter, unspecified: Secondary | ICD-10-CM

## 2014-06-15 DIAGNOSIS — E041 Nontoxic single thyroid nodule: Secondary | ICD-10-CM | POA: Diagnosis not present

## 2014-06-19 DIAGNOSIS — N189 Chronic kidney disease, unspecified: Secondary | ICD-10-CM | POA: Diagnosis not present

## 2014-06-19 DIAGNOSIS — G609 Hereditary and idiopathic neuropathy, unspecified: Secondary | ICD-10-CM | POA: Diagnosis not present

## 2014-06-19 DIAGNOSIS — E1142 Type 2 diabetes mellitus with diabetic polyneuropathy: Secondary | ICD-10-CM | POA: Diagnosis not present

## 2014-06-19 DIAGNOSIS — E780 Pure hypercholesterolemia: Secondary | ICD-10-CM | POA: Diagnosis not present

## 2014-06-25 ENCOUNTER — Other Ambulatory Visit (HOSPITAL_COMMUNITY): Payer: Self-pay | Admitting: Obstetrics and Gynecology

## 2014-06-25 DIAGNOSIS — Z1231 Encounter for screening mammogram for malignant neoplasm of breast: Secondary | ICD-10-CM

## 2014-07-24 ENCOUNTER — Ambulatory Visit (HOSPITAL_COMMUNITY)
Admission: RE | Admit: 2014-07-24 | Discharge: 2014-07-24 | Disposition: A | Discharge status: Home / Self Care | Payer: Medicare Other | Source: Ambulatory Visit | Attending: Obstetrics and Gynecology | Admitting: Obstetrics and Gynecology

## 2014-07-24 DIAGNOSIS — Z1231 Encounter for screening mammogram for malignant neoplasm of breast: Secondary | ICD-10-CM | POA: Insufficient documentation

## 2014-07-27 DIAGNOSIS — D3612 Benign neoplasm of peripheral nerves and autonomic nervous system, upper limb, including shoulder: Secondary | ICD-10-CM | POA: Diagnosis not present

## 2014-07-27 DIAGNOSIS — D1801 Hemangioma of skin and subcutaneous tissue: Secondary | ICD-10-CM | POA: Diagnosis not present

## 2014-07-27 DIAGNOSIS — D225 Melanocytic nevi of trunk: Secondary | ICD-10-CM | POA: Diagnosis not present

## 2014-07-27 DIAGNOSIS — L821 Other seborrheic keratosis: Secondary | ICD-10-CM | POA: Diagnosis not present

## 2014-07-27 DIAGNOSIS — B078 Other viral warts: Secondary | ICD-10-CM | POA: Diagnosis not present

## 2014-07-27 DIAGNOSIS — Z85820 Personal history of malignant melanoma of skin: Secondary | ICD-10-CM | POA: Diagnosis not present

## 2014-08-15 ENCOUNTER — Telehealth: Payer: Self-pay | Admitting: Internal Medicine

## 2014-08-15 NOTE — Telephone Encounter (Signed)
Caller name: Lajean Manes  Relation to pt: [facility]  Call back number: [phone]   Reason for call:   Requesting prior auth over the phone for rosuvastatin (CRESTOR) 10 MG tablet.

## 2014-08-20 NOTE — Telephone Encounter (Signed)
Insurance covers brand name only Crestor. The script was written that way. Faxed [facility] as to next steps to get medication filled. JG//CMA

## 2014-10-09 ENCOUNTER — Telehealth: Payer: Self-pay | Admitting: Internal Medicine

## 2014-10-09 DIAGNOSIS — N958 Other specified menopausal and perimenopausal disorders: Secondary | ICD-10-CM | POA: Diagnosis not present

## 2014-10-09 DIAGNOSIS — M8588 Other specified disorders of bone density and structure, other site: Secondary | ICD-10-CM | POA: Diagnosis not present

## 2014-10-09 NOTE — Telephone Encounter (Signed)
Patient of Dr. Larose Kells would like to transfer to Dr. Birdie Riddle due to patient living in [place]. Patient states Dr. Larose Kells is outstanding but location would work in her favor.Please advise

## 2014-10-09 NOTE — Telephone Encounter (Signed)
Ok to switch 

## 2014-10-09 NOTE — Telephone Encounter (Signed)
Okay with me, thank you 

## 2014-10-17 NOTE — Telephone Encounter (Signed)
Patient scheduled 04/11/15.

## 2014-10-23 ENCOUNTER — Telehealth: Payer: Self-pay

## 2014-10-23 MED ORDER — ALPRAZOLAM 0.5 MG PO TABS: 0.5000 mg | ORAL_TABLET | Freq: Every evening | ORAL | Status: DC | PRN

## 2014-10-23 NOTE — Telephone Encounter (Signed)
Rx printed, awaiting MD signature.  

## 2014-10-23 NOTE — Telephone Encounter (Signed)
Okay #90 and 2 refills 

## 2014-10-23 NOTE — Telephone Encounter (Signed)
Pt is requesting refill on Alprazolam.  Last OV: 06/05/2014, appt scheduled with Dr. Birdie Riddle (switching PCP) on 04/11/2015 at 0900 Last Fill: 02/05/2014 #90 2RF UDS: 04/06/2014 Low risk  Please advise.

## 2014-10-23 NOTE — Telephone Encounter (Signed)
Rx faxed to [facility] pharmacy.  

## 2014-10-24 DIAGNOSIS — H01022 Squamous blepharitis right lower eyelid: Secondary | ICD-10-CM | POA: Diagnosis not present

## 2014-10-24 DIAGNOSIS — H01024 Squamous blepharitis left upper eyelid: Secondary | ICD-10-CM | POA: Diagnosis not present

## 2014-10-24 DIAGNOSIS — H04123 Dry eye syndrome of bilateral lacrimal glands: Secondary | ICD-10-CM | POA: Diagnosis not present

## 2014-10-24 DIAGNOSIS — H01021 Squamous blepharitis right upper eyelid: Secondary | ICD-10-CM | POA: Diagnosis not present

## 2014-10-24 DIAGNOSIS — H01025 Squamous blepharitis left lower eyelid: Secondary | ICD-10-CM | POA: Diagnosis not present

## 2014-10-24 DIAGNOSIS — H2513 Age-related nuclear cataract, bilateral: Secondary | ICD-10-CM | POA: Diagnosis not present

## 2014-10-26 DIAGNOSIS — H2513 Age-related nuclear cataract, bilateral: Secondary | ICD-10-CM | POA: Diagnosis not present

## 2014-10-26 DIAGNOSIS — E119 Type 2 diabetes mellitus without complications: Secondary | ICD-10-CM | POA: Diagnosis not present

## 2014-10-26 DIAGNOSIS — H04123 Dry eye syndrome of bilateral lacrimal glands: Secondary | ICD-10-CM | POA: Diagnosis not present

## 2014-10-29 ENCOUNTER — Encounter: Payer: Self-pay | Admitting: Family Medicine

## 2014-10-29 ENCOUNTER — Telehealth: Payer: Self-pay | Admitting: *Deleted

## 2014-10-29 ENCOUNTER — Ambulatory Visit (INDEPENDENT_AMBULATORY_CARE_PROVIDER_SITE_OTHER): Payer: Medicare Other | Admitting: Family Medicine

## 2014-10-29 ENCOUNTER — Ambulatory Visit (HOSPITAL_BASED_OUTPATIENT_CLINIC_OR_DEPARTMENT_OTHER)
Admission: RE | Admit: 2014-10-29 | Discharge: 2014-10-29 | Disposition: A | Discharge status: Home / Self Care | Payer: Medicare Other | Source: Ambulatory Visit | Attending: Family Medicine | Admitting: Family Medicine

## 2014-10-29 ENCOUNTER — Other Ambulatory Visit: Payer: Self-pay | Admitting: Family Medicine

## 2014-10-29 VITALS — BP 130/82 | HR 79 | Temp 98.1°F | Resp 16 | Ht 64.0 in | Wt 143.4 lb

## 2014-10-29 DIAGNOSIS — R1084 Generalized abdominal pain: Secondary | ICD-10-CM

## 2014-10-29 DIAGNOSIS — R0781 Pleurodynia: Secondary | ICD-10-CM | POA: Diagnosis not present

## 2014-10-29 DIAGNOSIS — E119 Type 2 diabetes mellitus without complications: Secondary | ICD-10-CM | POA: Diagnosis not present

## 2014-10-29 DIAGNOSIS — R319 Hematuria, unspecified: Secondary | ICD-10-CM

## 2014-10-29 DIAGNOSIS — Z85820 Personal history of malignant melanoma of skin: Secondary | ICD-10-CM | POA: Diagnosis not present

## 2014-10-29 DIAGNOSIS — R197 Diarrhea, unspecified: Secondary | ICD-10-CM | POA: Diagnosis not present

## 2014-10-29 LAB — CBC WITH DIFFERENTIAL/PLATELET
Basophils Absolute: 0 10*3/uL (ref 0.0–0.1)
Basophils Relative: 0.4 % (ref 0.0–3.0)
Eosinophils Absolute: 0.1 10*3/uL (ref 0.0–0.7)
Eosinophils Relative: 1.7 % (ref 0.0–5.0)
HCT: 44.3 % (ref 36.0–46.0)
Hemoglobin: 14.6 g/dL (ref 12.0–15.0)
Lymphocytes Relative: 23.5 % (ref 12.0–46.0)
Lymphs Abs: 1.8 10*3/uL (ref 0.7–4.0)
MCHC: 33 g/dL (ref 30.0–36.0)
MCV: 91.8 fl (ref 78.0–100.0)
Monocytes Absolute: 0.7 10*3/uL (ref 0.1–1.0)
Monocytes Relative: 9.2 % (ref 3.0–12.0)
Neutro Abs: 5 10*3/uL (ref 1.4–7.7)
Neutrophils Relative %: 65.2 % (ref 43.0–77.0)
Platelets: 247 10*3/uL (ref 150.0–400.0)
RBC: 4.83 Mil/uL (ref 3.87–5.11)
RDW: 13 % (ref 11.5–15.5)
WBC: 7.6 10*3/uL (ref 4.0–10.5)

## 2014-10-29 LAB — HEPATIC FUNCTION PANEL
ALT: 17 U/L (ref 0–35)
AST: 19 U/L (ref 0–37)
Albumin: 4.6 g/dL (ref 3.5–5.2)
Alkaline Phosphatase: 82 U/L (ref 39–117)
Bilirubin, Direct: 0.1 mg/dL (ref 0.0–0.3)
Total Bilirubin: 0.4 mg/dL (ref 0.2–1.2)
Total Protein: 7.6 g/dL (ref 6.0–8.3)

## 2014-10-29 LAB — POCT URINALYSIS DIPSTICK
Bilirubin, UA: NEGATIVE
Blood, UA: NEGATIVE
Glucose, UA: NEGATIVE
Ketones, UA: NEGATIVE
Nitrite, UA: NEGATIVE
Protein, UA: NEGATIVE
Spec Grav, UA: 1.02
Urobilinogen, UA: 0.2
pH, UA: 6

## 2014-10-29 LAB — BASIC METABOLIC PANEL
BUN: 23 mg/dL (ref 6–23)
CO2: 33 mEq/L — ABNORMAL HIGH (ref 19–32)
Calcium: 10.6 mg/dL — ABNORMAL HIGH (ref 8.4–10.5)
Chloride: 103 mEq/L (ref 96–112)
Creatinine, Ser: 0.92 mg/dL (ref 0.40–1.20)
GFR: 64.37 mL/min (ref >60.00)
Glucose, Bld: 159 mg/dL — ABNORMAL HIGH (ref 70–99)
Potassium: 5.3 mEq/L — ABNORMAL HIGH (ref 3.5–5.1)
Sodium: 143 mEq/L (ref 135–145)

## 2014-10-29 LAB — AMYLASE: Amylase: 33 U/L (ref 27–131)

## 2014-10-29 LAB — LIPASE: Lipase: 31 U/L (ref 11.0–59.0)

## 2014-10-29 MED ORDER — HYDROCODONE-ACETAMINOPHEN 5-325 MG PO TABS: 1.0000 | ORAL_TABLET | Freq: Four times a day (QID) | ORAL | Status: DC | PRN

## 2014-10-29 MED ORDER — CIPROFLOXACIN HCL 500 MG PO TABS: 500.0000 mg | ORAL_TABLET | Freq: Two times a day (BID) | ORAL | Status: DC

## 2014-10-29 NOTE — Progress Notes (Signed)
Pre visit review using our clinic review tool, if applicable. No additional management support is needed unless otherwise documented below in the visit note/SLS  

## 2014-10-29 NOTE — Assessment & Plan Note (Signed)
New to provider, recurring problem for pt.  Pt's current GI is leaving and she does not have a new one assigned at this time.  Pt's L periumbilical pain is concerning for possible diverticulitis (pt has hx of this).  RUQ pain could be diverticulitis/colitis but this is less likely.  Pt does not have gall bladder.  No fevers.  + diarrhea.  Check labs to r/o infectious etiology.  Check stool for C Diff.  Check UA.  Due to pain over R lower rib, will get xray and start low dose pain meds for comfort.  Reviewed supportive care and red flags that should prompt return.  Pt expressed understanding and is in agreement w/ plan.

## 2014-10-29 NOTE — Telephone Encounter (Signed)
-----   Message from Midge Minium, MD sent at 10/29/2014  4:19 PM EDT ----- No obvious infection based on blood counts and no other abnormalities w/ the exception of large amount of white blood cells in the urine.  Based on this, we will start Cipro 500mg  BID x5 days and will await urine culture results Ivin Booty- please have lab add culture and send meds to pharmacy.  Thanks!)

## 2014-10-29 NOTE — Patient Instructions (Signed)
Follow up as needed Please go downstairs and get your xray done after you go to the lab We'll notify you of your lab results and make any changes if needed Use the hydrocodone as needed for pain If there is no obvious cause for pain on your labs or imaging, we'll refer you to GI If at any time your symptoms change or worsen, please go to the ER Call with any questions or concerns Hang in there!!

## 2014-10-29 NOTE — Progress Notes (Signed)
   Subjective:    Patient ID: [name] [name], female    DOB: May 08, 1945, 69 y.o.   MRN: [id number]  HPI abd pain- pt is unable to sit due to pain.  Pain is RUQ, radiating around to back.  Pain worsens after eating temporarily but pain is most severe when sitting.  Pt has hx of abd pain- took 2 of her remaining hyoscyamine w/ some relief.  No N/V.  + diarrhea w/ some accidents over last 2 weeks.  Pt reports pain when palpating lower ribs.  sxs started ~2 months ago.   Review of Systems For ROS see HPI     Objective:   Physical Exam  Constitutional: She is oriented to person, place, and time. She appears well-developed and well-nourished. No distress.  Refusing to sit due to pain  HENT:  Head: Normocephalic and atraumatic.  Cardiovascular: Normal rate, regular rhythm, normal heart sounds and intact distal pulses.   Pulmonary/Chest: Effort normal and breath sounds normal. No respiratory distress. She has no wheezes. She has no rales.  Abdominal: Soft. Bowel sounds are normal. She exhibits no distension and no mass. There is tenderness (TTP over L periumbicical area, RUQ, R lower rib in mid-axillary line). There is no rebound and no guarding.  No CVA tenderness  Neurological: She is alert and oriented to person, place, and time.  Skin: Skin is warm and dry.  Psychiatric: She has a normal mood and affect. Her behavior is normal. Thought content normal.  Vitals reviewed.         Assessment & Plan:

## 2014-10-29 NOTE — Addendum Note (Signed)
Addended by: Caffie Pinto on: 10/29/2014 05:20 PM   Modules accepted: Orders

## 2014-10-29 NOTE — Telephone Encounter (Signed)
Patient informed, understood & agreed; new Rx to pharmacy/SLS  

## 2014-10-29 NOTE — Assessment & Plan Note (Signed)
Pt has hx of similar.  Unclear if this is infectious in nature vs pt's IBS or other GI etiology.  Check labs.  Stools should slow w/ use of pain meds.  Reviewed supportive care and red flags that should prompt return.  Pt expressed understanding and is in agreement w/ plan.

## 2014-10-30 LAB — URINE CULTURE: Colony Count: 50000

## 2014-11-02 ENCOUNTER — Other Ambulatory Visit: Payer: Self-pay | Admitting: Family Medicine

## 2014-11-02 ENCOUNTER — Other Ambulatory Visit: Payer: Medicare Other

## 2014-11-02 DIAGNOSIS — R197 Diarrhea, unspecified: Secondary | ICD-10-CM | POA: Diagnosis not present

## 2014-11-03 LAB — C. DIFFICILE GDH AND TOXIN A/B
C. difficile GDH: NOT DETECTED
C. difficile Toxin A/B: NOT DETECTED

## 2014-11-19 DIAGNOSIS — E119 Type 2 diabetes mellitus without complications: Secondary | ICD-10-CM | POA: Diagnosis not present

## 2014-11-19 DIAGNOSIS — H2513 Age-related nuclear cataract, bilateral: Secondary | ICD-10-CM | POA: Diagnosis not present

## 2014-11-19 DIAGNOSIS — H04123 Dry eye syndrome of bilateral lacrimal glands: Secondary | ICD-10-CM | POA: Diagnosis not present

## 2014-11-19 LAB — HM DIABETES EYE EXAM

## 2014-12-06 ENCOUNTER — Ambulatory Visit (INDEPENDENT_AMBULATORY_CARE_PROVIDER_SITE_OTHER): Payer: Medicare Other | Admitting: Neurology

## 2014-12-06 ENCOUNTER — Encounter: Payer: Self-pay | Admitting: Neurology

## 2014-12-06 VITALS — BP 128/76 | HR 80 | Ht 63.0 in | Wt 146.5 lb

## 2014-12-06 DIAGNOSIS — G441 Vascular headache, not elsewhere classified: Secondary | ICD-10-CM

## 2014-12-06 DIAGNOSIS — M47812 Spondylosis without myelopathy or radiculopathy, cervical region: Secondary | ICD-10-CM

## 2014-12-06 NOTE — Progress Notes (Signed)
Reason for visit:  Cervicogenic headache  [name] [name] is an 69 y.o. female  History of present illness:   [name] [name] is a 69 year old right-handed white female with a history of cervical spondylosis and cervicogenic headache. The patient actually does fairly well with a combination of propranolol and amitriptyline for her headaches. The patient takes only 10 mg twice daily of the propranolol, she takes 25 mg at night of the amitriptyline. The patient has tried to cut back on amitriptyline because she has dry eyes , but her symptoms severely worsened, and she cannot tolerate the dose decrease. The patient returns to this office for further evaluation. She has noted some gait instability at times, she will stumble. The patient indicates that she is sleeping fairly well at night.  Past Medical History  Diagnosis Date  . Hyperlipidemia   . Osteopenia   . Diverticulosis of colon   . GERD with stricture     s/p dilatations  . Abdominal pain     EGD and CTs neg, rx Amitriptiline (non ulcer dyspepsia) EGD 12/11.. s/p ballon dilitation  . Anxiety   . Solar lentigo     face; s/p excision 3/09. See Derm q 6 months  . Hypothyroidism      Dx 10-9 TSH 5.2 , then TSH normalized, eventually went up again 2012  . Trigeminal neuralgia     h/o   . Melanoma ([place])     h/o  . Diabetes mellitus     no meds, diet controlled  . Cervical spondylosis without myelopathy 11/30/2012  . History of Lyme disease   . Chronic conjunctivitis   . HOH (hard of hearing)     bilateral hearing aids  . Memory difficulties 06/05/2014    Past Surgical History  Procedure Laterality Date  . Cervical fusion    . Cholecystectomy    . Abdominal hysterectomy    . Shoulder surgery      right  . Colon resection  1999    d/t diverticulitis  . Wrist surgery      removed melaoma, right  . Colon surgery    . Melanoma resection Right      Right Arm Surgery, R facial surgery (05-2012)   . Ptx s/t chest tube       Family History  Problem Relation Age of Onset  . Colon cancer Neg Hx   . Breast cancer Neg Hx   . Diabetes Neg Hx   . Stomach cancer Neg Hx   . Rectal cancer Neg Hx   . Prostate cancer Father   . Heart attack Paternal Grandfather     GF in his 54s  . Melanoma Maternal Uncle   . Endometrial cancer Sister     clear cell carcinoma    Social history:  reports that she has quit smoking. She started smoking about 38 years ago. She has never used smokeless tobacco. She reports that she drinks about 1.2 - 1.8 oz of alcohol per week. She reports that she does not use illicit drugs.    Allergies  Allergen Reactions  . Ceftriaxone Sodium Anaphylaxis  . Rocephin [Ceftriaxone]   . Benadryl [Diphenhydramine Hcl] Anxiety    Hyperactivity    Medications:  Prior to Admission medications   Medication Sig Start Date End Date Taking? Authorizing Provider  ALPRAZolam Duanne Moron) 0.5 MG tablet Take 1 tablet (0.5 mg total) by mouth at bedtime as needed for sleep. 10/23/14  Yes Colon Branch, MD  amitriptyline Madelin Headings)  25 MG tablet Take 25 mg by mouth at bedtime.    Yes Historical Provider, MD  Cholecalciferol (VITAMIN D3) 2000 UNITS TABS Take 1 tablet by mouth daily.    Yes Historical Provider, MD  gabapentin (NEURONTIN) 300 MG capsule 1 capsule in the morning, 2 in the evening 06/05/14  Yes Kathrynn Ducking, MD  hyoscyamine (LEVSIN/SL) 0.125 MG SL tablet Place 1 tablet (0.125 mg total) under the tongue every 4 (four) hours as needed. 01/15/14  Yes Rosalita Chessman, DO  levothyroxine (SYNTHROID) 25 MCG tablet Take 25 mcg by mouth daily. Friday, Saturday and Sunday-  41mcg   Yes Historical Provider, MD  Multiple Vitamin (MULTIVITAMIN) tablet Take 1 tablet by mouth daily.     Yes Historical Provider, MD  omeprazole (PRILOSEC) 20 MG capsule Take 1 capsule (20 mg total) by mouth daily. 04/09/14  Yes Colon Branch, MD  propranolol (INDERAL) 10 MG tablet One tablet in the morning and 2 tablets at night Patient  taking differently: Take 10 mg by mouth 2 (two) times daily. One tablet in the morning and 1 tablet at night 06/05/14  Yes Kathrynn Ducking, MD  rosuvastatin (CRESTOR) 10 MG tablet Take 1 tablet (10 mg total) by mouth daily. 04/09/14  Yes Colon Branch, MD    ROS:  Out of a complete 14 system review of symptoms, the patient complains only of the following symptoms, and all other reviewed systems are negative.   Hearing loss, ringing in the ears, drooling when asleep  Snoring  Blood pressure 128/76, pulse 80, height 5\' 3"  (1.6 m), weight 146 lb 8 oz (66.452 kg).  Physical Exam  General: The patient is alert and cooperative at the time of the examination.   Neuromuscular: The patient lacks about 20 or 25 of lateral rotation of the cervical spine bilaterally.  Skin: No significant peripheral edema is noted.   Neurologic Exam  Mental status: The patient is alert and oriented x 3 at the time of the examination. The patient has apparent normal recent and remote memory, with an apparently normal attention span and concentration ability.   Cranial nerves: Facial symmetry is present. Speech is normal, no aphasia or dysarthria is noted. Extraocular movements are full. Visual fields are full.  Motor: The patient has good strength in all 4 extremities.  Sensory examination: Soft touch sensation is symmetric on the face, arms, and legs.  Coordination: The patient has good finger-nose-finger and heel-to-shin bilaterally.  Gait and station: The patient has a normal gait. Tandem gait is normal. Romberg is negative. No drift is seen.  Reflexes: Deep tendon reflexes are symmetric.   Assessment/Plan:   1. Cervicogenic headache   2. Mild gait instability   The patient is doing well with her current medication regimen. We will continue the amitriptyline and propranolol. The patient is on gabapentin as well , this may result in some mild gait instability. She will follow-up in 8 or 9  months.  Jill Alexanders MD 12/06/2014 4:36 PM  [facility] [address] [place], [place] [address]  Phone [phone] Fax [phone]

## 2014-12-06 NOTE — Patient Instructions (Signed)
Headache and Arthritis  If you have arthritis and headaches, it is possible the two problems are related. Some headaches can be caused by arthritis in your neck (cervicogenic headaches).   Pain medicine is another possible link between arthritis and headache. If you take a lot of over-the-counter medicines for arthritis pain, you may develop the type of headache that can happen when you stop taking your over-the-counter pain reliever or lower your dose too quickly (rebound headache).   WHAT TYPES OF ARTHRITIS CAN CAUSE A HEADACHE?  There are two types of arthritis, rheumatoid arthritis and osteoarthritis. Both types of arthritis can cause headaches.   · Rheumatoid arthritis (RA) is an autoimmune disease that causes inflammation of your joints. When you have RA, your body's defense system (immune system) attacks the joints of your body and causes inflammation. This can lead to deformity over time.  · Osteoarthritis (OA) is wear and tear caused by joint use over time. Osteoarthritis is not an inflammatory disease.  Both OA and RA can cause neck pain that is felt in the head. When the pain is felt in a different location than it originates, it is called radiating or referred pain. This pain is usually felt in the back of the head.   HOW ARE HEADACHES AND ARTHRITIS RELATED?  RA can affect any joint in the body, including the joints between the bones of the neck (cervical vertebrae). The neck joints most commonly affected by RA are the top two joints, between the first and second cervical vertebra. Inflammation in these joints may be felt as neck pain and head pain.  OA of the neck may be caused by gradual wear and tear or by a neck injury. Neck vertebrae may develop calcium deposits in the areas where muscle attach. Wear and tear of the vertebra may cause pressure on the nerves that leave the spinal cord. These changes can cause referred pain that may be felt as a headache.  HOW ARE HEADACHES ASSOCIATED WITH ARTHRITIS  DIAGNOSED?  · Your health care provider may diagnose headache caused by RA if you have inflammation of vertebrae in your neck. You may have:  ¨ Blood tests to measure how much inflammation you have.  ¨ Imaging studies of your neck (MRI) to check for inflammation of cervical vertebrae.  · Your health care provider may diagnose headache caused by OA if an X-ray shows:  ¨ Calcium deposits.  ¨ Bone spurs.  ¨ Narrowing of the space between neck vertebrae.  · Your health care provider may diagnose rebound headache if you have a history of using over-the-counter pain relievers frequently.  WHEN SHOULD I SEEK CARE FOR MY HEADACHES?  Call your health care provider if:  · You have more than three headaches per week.  · You take an over-the-counter pain reliever almost every day.  · Your headaches are getting worse and happening more often.  · You have headache with fever.  · You have headache with numbness, weakness, or dizziness.  · You have headache with nausea or vomiting.  WHAT ARE MY TREATMENT OPTIONS?  · If you have headache caused by RA, treatment may include:    Over-the-counter or prescription-strength anti-inflammatory medicines.    Disease-modifying antirheumatic drugs (DMARDs). These medicines slow or stop the progression of RA.  · If you have headache caused by OA, treatment may include:    Over-the-counter pain medicines.    Heat or massage.    Physical therapy.  · If you have rebound headaches:      They will usually go away within several days of stopping the medicine that caused them.    You may be able to gradually reduce the amount of medicines you take to prevent headache.    Ask your health care provider if you can take another type of medicine instead.     This information is not intended to replace advice given to you by your health care provider. Make sure you discuss any questions you have with your health care provider.     Document Released: 03/28/2003 Document Revised: 01/26/2014 Document Reviewed:  04/10/2013  [employer]vier Interactive Patient Education ©2016 [employer]

## 2014-12-11 DIAGNOSIS — E7800 Pure hypercholesterolemia, unspecified: Secondary | ICD-10-CM | POA: Diagnosis not present

## 2014-12-11 DIAGNOSIS — E039 Hypothyroidism, unspecified: Secondary | ICD-10-CM | POA: Diagnosis not present

## 2014-12-11 DIAGNOSIS — E1142 Type 2 diabetes mellitus with diabetic polyneuropathy: Secondary | ICD-10-CM | POA: Diagnosis not present

## 2015-01-23 DIAGNOSIS — Z23 Encounter for immunization: Secondary | ICD-10-CM | POA: Diagnosis not present

## 2015-01-23 DIAGNOSIS — E7800 Pure hypercholesterolemia, unspecified: Secondary | ICD-10-CM | POA: Diagnosis not present

## 2015-01-23 DIAGNOSIS — R109 Unspecified abdominal pain: Secondary | ICD-10-CM | POA: Diagnosis not present

## 2015-01-23 DIAGNOSIS — E1142 Type 2 diabetes mellitus with diabetic polyneuropathy: Secondary | ICD-10-CM | POA: Diagnosis not present

## 2015-01-23 DIAGNOSIS — N189 Chronic kidney disease, unspecified: Secondary | ICD-10-CM | POA: Diagnosis not present

## 2015-01-23 DIAGNOSIS — G609 Hereditary and idiopathic neuropathy, unspecified: Secondary | ICD-10-CM | POA: Diagnosis not present

## 2015-01-23 LAB — HM DIABETES FOOT EXAM

## 2015-01-23 LAB — HEMOGLOBIN A1C: Hemoglobin A1C: 6.9

## 2015-02-07 ENCOUNTER — Other Ambulatory Visit: Payer: Self-pay | Admitting: Endocrinology

## 2015-02-07 DIAGNOSIS — R1084 Generalized abdominal pain: Secondary | ICD-10-CM

## 2015-02-13 ENCOUNTER — Ambulatory Visit
Admission: RE | Admit: 2015-02-13 | Discharge: 2015-02-13 | Disposition: A | Discharge status: Home / Self Care | Payer: Medicare Other | Source: Ambulatory Visit | Attending: Endocrinology | Admitting: Endocrinology

## 2015-02-13 DIAGNOSIS — R1084 Generalized abdominal pain: Secondary | ICD-10-CM

## 2015-02-13 DIAGNOSIS — R109 Unspecified abdominal pain: Secondary | ICD-10-CM | POA: Diagnosis not present

## 2015-02-13 MED ORDER — IOPAMIDOL (ISOVUE-300) INJECTION 61%
100.0000 mL | Freq: Once | INTRAVENOUS | Status: AC | PRN
Start: 2015-02-13 — End: 2015-02-13
  Administered 2015-02-13: 100 mL via INTRAVENOUS

## 2015-02-19 ENCOUNTER — Ambulatory Visit (INDEPENDENT_AMBULATORY_CARE_PROVIDER_SITE_OTHER): Payer: Medicare Other | Admitting: Physician Assistant

## 2015-02-19 ENCOUNTER — Other Ambulatory Visit (INDEPENDENT_AMBULATORY_CARE_PROVIDER_SITE_OTHER): Payer: Medicare Other

## 2015-02-19 ENCOUNTER — Encounter: Payer: Self-pay | Admitting: Physician Assistant

## 2015-02-19 ENCOUNTER — Other Ambulatory Visit: Payer: Medicare Other

## 2015-02-19 VITALS — BP 120/80 | HR 78 | Ht 64.0 in | Wt 145.4 lb

## 2015-02-19 DIAGNOSIS — R109 Unspecified abdominal pain: Secondary | ICD-10-CM

## 2015-02-19 DIAGNOSIS — R1012 Left upper quadrant pain: Secondary | ICD-10-CM | POA: Diagnosis not present

## 2015-02-19 DIAGNOSIS — K5792 Diverticulitis of intestine, part unspecified, without perforation or abscess without bleeding: Secondary | ICD-10-CM | POA: Diagnosis not present

## 2015-02-19 DIAGNOSIS — K591 Functional diarrhea: Secondary | ICD-10-CM

## 2015-02-19 DIAGNOSIS — K5732 Diverticulitis of large intestine without perforation or abscess without bleeding: Secondary | ICD-10-CM | POA: Diagnosis not present

## 2015-02-19 LAB — CBC WITH DIFFERENTIAL/PLATELET
Basophils Absolute: 0 10*3/uL (ref 0.0–0.1)
Basophils Relative: 0.4 % (ref 0.0–3.0)
Eosinophils Absolute: 0.1 10*3/uL (ref 0.0–0.7)
Eosinophils Relative: 1.1 % (ref 0.0–5.0)
HCT: 41.6 % (ref 36.0–46.0)
Hemoglobin: 13.8 g/dL (ref 12.0–15.0)
Lymphocytes Relative: 23.5 % (ref 12.0–46.0)
Lymphs Abs: 1.7 10*3/uL (ref 0.7–4.0)
MCHC: 33.3 g/dL (ref 30.0–36.0)
MCV: 91.8 fl (ref 78.0–100.0)
Monocytes Absolute: 0.8 10*3/uL (ref 0.1–1.0)
Monocytes Relative: 10.6 % (ref 3.0–12.0)
Neutro Abs: 4.6 10*3/uL (ref 1.4–7.7)
Neutrophils Relative %: 64.4 % (ref 43.0–77.0)
Platelets: 244 10*3/uL (ref 150.0–400.0)
RBC: 4.53 Mil/uL (ref 3.87–5.11)
RDW: 12.8 % (ref 11.5–15.5)
WBC: 7.1 10*3/uL (ref 4.0–10.5)

## 2015-02-19 LAB — IGA: IgA: 235 mg/dL (ref 68–378)

## 2015-02-19 LAB — C-REACTIVE PROTEIN: CRP: 0.1 mg/dL — ABNORMAL LOW (ref 0.5–20.0)

## 2015-02-19 MED ORDER — HYOSCYAMINE SULFATE 0.125 MG SL SUBL: 0.1250 mg | SUBLINGUAL_TABLET | Freq: Four times a day (QID) | SUBLINGUAL | Status: DC | PRN

## 2015-02-19 MED ORDER — NA SULFATE-K SULFATE-MG SULF 17.5-3.13-1.6 GM/177ML PO SOLN: 1.0000 | ORAL | Status: DC

## 2015-02-19 NOTE — Progress Notes (Signed)
Patient ID: [name] [name], female   DOB: 02-24-1945, 70 y.o.   MRN: [id number]     History of Present Illness: [name] is a pleasant 70 year old female who was previously followed by Dr. Deatra Ina. She has a history of GERD and prior esophageal strictures that have required dilations in the past. She also has diverticulosis and last had a colonoscopy in 2009. She has a history of remote colon resection for diverticulitis in 1999 and is also status post small bowel obstruction secondary to adhesions and cholecystectomy. She had a flexible sigmoidoscopy in October 2013 where there was noted to be evidence of a prior surgical anastomosis at 20 cm from the anus. The colonic mucosa appeared normal. Her last EGD was in January 2016 at which time she was noted to have a stricture at the GE junction. The stricture was dilated using an 18 mm Maloney dilator EGD was otherwise normal.  [name] [name] presents today with complaints of left-sided abdominal pain. She reports that since October 2016 she has had left-sided abdominal pain. She states she was evaluated in October by her PCP and was told that she had diverticulitis. She had H. pylori testing and stool for C. difficile which was negative. She reports that in the past when she has had diverticulitis, she never has an abnormal white count or fever. When seen by her primary care physician in October, she states she was told she had a UTI and was given Cipro which helped while she was on the Cipro but once the Cipro was completed she again began to have cramping and intermittent diarrhea. She describes pain from the left upper quadrant to the left lower quadrant. She states when she gets up in the morning she feels fine but as the day goes on she begins to feel very bloated , gets crampy, and has to pass gas or have a bowel movement. Her stools are often loose and appear oily. Sometimes her stools are very soft and float. He has not had any bright red blood per rectum or  melena. She reports that if she exercises she will experience a pinching in the left side and developed left-sided cramping that we will diminish after she stops exercising. She recently had a CT of the abdomen and pelvis on January 25 due to complaints of left-sided pain. She was noted to have scattered colonic diverticulosis, most pronounced in the right colon. Postoperative changes in the sigmoid colon. Stomach and small bowel and decompressed and unremarkable. Aorta is normal caliber with scattered calcifications. No free air, free fluid, or adenopathy. She denies fever, chills, or night sweats. She is distressed with the ongoing left-sided pain and loose bowel movements. She is not aware of the family history of celiac disease but is of [place] descent. She has a history of diabetes type 2, hypothyroidism, trigeminal neuralgia, cervical spondylosis with myelopathy, melanoma, and osteopenia.   Past Medical History  Diagnosis Date  . Hyperlipidemia   . Osteopenia   . Diverticulosis of colon   . GERD with stricture     s/p dilatations  . Abdominal pain     EGD and CTs neg, rx Amitriptiline (non ulcer dyspepsia) EGD 12/11.. s/p ballon dilitation  . Anxiety   . Solar lentigo     face; s/p excision 3/09. See Derm q 6 months  . Hypothyroidism      Dx 10-9 TSH 5.2 , then TSH normalized, eventually went up again 2012  . Trigeminal neuralgia  h/o   . Melanoma ([place])     h/o  . Diabetes mellitus     no meds, diet controlled  . Cervical spondylosis without myelopathy 11/30/2012  . History of Lyme disease   . Chronic conjunctivitis   . HOH (hard of hearing)     bilateral hearing aids  . Memory difficulties 06/05/2014    Past Surgical History  Procedure Laterality Date  . Cervical fusion    . Cholecystectomy    . Abdominal hysterectomy    . Shoulder surgery      right  . Colon resection  1999    d/t diverticulitis  . Wrist surgery      removed melaoma, right  . Colon surgery    .  Melanoma resection Right      Right Arm Surgery, R facial surgery (05-2012)   . Ptx s/t chest tube     Family History  Problem Relation Age of Onset  . Colon cancer Neg Hx   . Breast cancer Neg Hx   . Diabetes Neg Hx   . Stomach cancer Neg Hx   . Rectal cancer Neg Hx   . Prostate cancer Father   . Heart attack Paternal Grandfather     GF in his 59s  . Melanoma Maternal Uncle   . Endometrial cancer Sister     clear cell carcinoma   Social History  Substance Use Topics  . Smoking status: Former Smoker    Start date: 01/20/1976  . Smokeless tobacco: Never Used  . Alcohol Use: 1.2 - 1.8 oz/week    2-3 Glasses of wine per week   Current Outpatient Prescriptions  Medication Sig Dispense Refill  . ALPRAZolam (XANAX) 0.5 MG tablet Take 1 tablet (0.5 mg total) by mouth at bedtime as needed for sleep. (Patient taking differently: Take 0.5 mg by mouth at bedtime as needed. ) 90 tablet 2  . amitriptyline (ELAVIL) 25 MG tablet Take 25 mg by mouth at bedtime.     . Cholecalciferol (VITAMIN D3) 2000 UNITS TABS Take 1 tablet by mouth daily.     [name] gabapentin (NEURONTIN) 300 MG capsule 1 capsule in the morning, 2 in the evening 270 capsule 3  . hyoscyamine (LEVSIN/SL) 0.125 MG SL tablet Place 1 tablet (0.125 mg total) under the tongue every 6 (six) hours as needed for cramping. 100 tablet 1  . levothyroxine (SYNTHROID) 25 MCG tablet Take 25 mcg by mouth daily. Friday, Saturday and Sunday-  2mg    . Multiple Vitamin (MULTIVITAMIN) tablet Take 1 tablet by mouth daily.      .Marland Kitchenomeprazole (PRILOSEC) 20 MG capsule Take 1 capsule (20 mg total) by mouth daily. 90 capsule 3  . propranolol (INDERAL) 10 MG tablet One tablet in the morning and 2 tablets at night (Patient taking differently: Take 10 mg by mouth 2 (two) times daily. One tablet in the morning and 1 tablet at night) 270 tablet 3  . rosuvastatin (CRESTOR) 10 MG tablet Take 1 tablet (10 mg total) by mouth daily. 90 tablet 3  . Na Sulfate-K  Sulfate-Mg Sulf SOLN Take 1 kit by mouth as directed. 354 mL 0   No current facility-administered medications for this visit.   Allergies  Allergen Reactions  . Ceftriaxone Sodium Anaphylaxis  . Rocephin [Ceftriaxone]   . Benadryl [Diphenhydramine Hcl] Anxiety    Hyperactivity     Review of Systems: Per history of present illness otherwise negative.  Studies:   Ct Abdomen Pelvis W Contrast  02/13/2015  CLINICAL DATA:  Mid abdominal pain, bloating, diarrhea for 3 months EXAM: CT ABDOMEN AND PELVIS WITH CONTRAST TECHNIQUE: Multidetector CT imaging of the abdomen and pelvis was performed using the standard protocol following bolus administration of intravenous contrast. CONTRAST:  172m ISOVUE-300 IOPAMIDOL (ISOVUE-300) INJECTION 61% COMPARISON:  01/13/2014 FINDINGS: Linear scarring or atelectasis in the right lung base. Lung bases otherwise clear. Heart is normal size. No effusions. Prior cholecystectomy. Liver, spleen, pancreas, adrenals and kidneys are unremarkable. Small parapelvic cysts on the left. No hydronephrosis. Scattered colonic diverticulosis, most pronounced in the right colon. Postoperative changes in the sigmoid colon. Stomach and small bowel are decompressed and unremarkable. Aorta is normal caliber with scattered calcifications. No free fluid, free air or adenopathy. Prior hysterectomy. No adnexal masses. Urinary bladder is unremarkable. No acute bony abnormality or focal bone lesion. IMPRESSION: Scattered colonic diverticulosis, most pronounced in the right colon. No active diverticulitis. Prior cholecystectomy. No acute findings in the abdomen or pelvis. Electronically Signed   By: KRolm BaptiseM.D.   On: 02/13/2015 13:56     Physical Exam: BP 120/80 mmHg  Pulse 78  Ht 5' 4" (1.626 m)  Wt 145 lb 6.4 oz (65.953 kg)  BMI 24.95 kg/m2 General: Pleasant, well developed , Caucasian female in no acute distress Head: Normocephalic and atraumatic Eyes:  sclerae anicteric,  conjunctiva pink  Ears: Normal auditory acuity Lungs: Clear throughout to auscultation Heart: Regular rate and rhythm Abdomen: Soft, non distended, tender entire left side of abdomen with no rebound or guarding, No masses, no hepatomegaly. Normal bowel sounds Musculoskeletal: Symmetrical with no gross deformities  Extremities: No edema  Neurological: Alert oriented x 4, grossly nonfocal Psychological:  Alert and cooperative. Normal mood and affect  Assessment and Recommendations: 70year old female with a history of diverticular disease, status post remote sigmoid resection, now experiencing left-sided abdominal pain associated with mushy stools, postprandial cramping and bloating. She may have a functional disorder/IBS/spastic diverticular disease. She's been instructed to adhere to a high-fiber low-fat diet. She will be given a trial of Levsin 0.125 mg one sublingually every 6 hours when necessary cramping. A CBC, CRP, IgA, TTG, and pancreatic fecal elastase will be obtained. If IgA and TTG are suggestive of celiac, she will be scheduled for an EGD with small bowel biopsy. If pancreatic fecal elastase is low, she will be given a trial of Creon. In the meantime, she will be scheduled for a colonoscopy to reevaluate the anastomosis as well as to evaluate for polyps, neoplasia, microscopic colitis, SCAD,  or other intraluminal pathology.The risks, benefits, and alternatives to colonoscopy with possible biopsy and possible polypectomy were discussed with the patient and they consent to proceed.  The procedure will be scheduled with Dr. AHavery Moros Further recommendations will be made pending the findings of the above.        Hvozdovic, LDeloris Ping1/31/2017,

## 2015-02-19 NOTE — Progress Notes (Signed)
Agree with assessment and plan with the following suggestions. Agree with trial of Levsin and blood work. She has had a normal colonoscopy in 2009 and a normal flex sig in 2013. I would see what blood work shows first and how she responds to [place] and other conservative measures first. If she doesn't respond and symptoms persist I would then schedule for colonoscopy but would prefer to see how she does first rather than proceeding directly to colonoscopy given her history of 2 exams in the past 7 years, neither of which showed abnormality.

## 2015-02-19 NOTE — Patient Instructions (Signed)
Your physician has requested that you go to the basement for lab work before leaving today.  You have been scheduled for a colonoscopy. Please follow written instructions given to you at your visit today.  Please pick up your prep supplies at the pharmacy within the next 1-3 days. If you use inhalers (even only as needed), please bring them with you on the day of your procedure. Your physician has requested that you go to www.startemmi.com and enter the access code given to you at your visit today. This web site gives a general overview about your procedure. However, you should still follow specific instructions given to you by our office regarding your preparation for the procedure.  We have sent the following medications to your pharmacy for you to pick up at your convenience: Levsin sublingual tablets every 6 hrs as needed for spasms  We have given you a high fiber, low fat diet

## 2015-02-19 NOTE — Progress Notes (Signed)
After further discussion of this case with [name] [name], the patient is significantly anxious about this issue and strongly wished to have a colonoscopy. For piece of mind we offered her the exam, as long as she understands and accepts the risks of the procedure.

## 2015-02-20 LAB — TISSUE TRANSGLUTAMINASE, IGA: Tissue Transglutaminase Ab, IgA: 1 U/mL (ref <4)

## 2015-02-22 DIAGNOSIS — D1801 Hemangioma of skin and subcutaneous tissue: Secondary | ICD-10-CM | POA: Diagnosis not present

## 2015-02-22 DIAGNOSIS — D485 Neoplasm of uncertain behavior of skin: Secondary | ICD-10-CM | POA: Diagnosis not present

## 2015-02-22 DIAGNOSIS — L821 Other seborrheic keratosis: Secondary | ICD-10-CM | POA: Diagnosis not present

## 2015-02-22 DIAGNOSIS — L570 Actinic keratosis: Secondary | ICD-10-CM | POA: Diagnosis not present

## 2015-02-22 DIAGNOSIS — D225 Melanocytic nevi of trunk: Secondary | ICD-10-CM | POA: Diagnosis not present

## 2015-02-22 DIAGNOSIS — Z85820 Personal history of malignant melanoma of skin: Secondary | ICD-10-CM | POA: Diagnosis not present

## 2015-02-26 LAB — PANCREATIC ELASTASE, FECAL: Pancreatic Elastase-1, Stool: 317 mcg/g

## 2015-03-01 ENCOUNTER — Ambulatory Visit (AMBULATORY_SURGERY_CENTER): Payer: Medicare Other | Admitting: Gastroenterology

## 2015-03-01 ENCOUNTER — Encounter: Payer: Self-pay | Admitting: Gastroenterology

## 2015-03-01 VITALS — BP 125/68 | HR 73 | Temp 98.3°F | Resp 16 | Ht 64.0 in | Wt 145.0 lb

## 2015-03-01 DIAGNOSIS — R197 Diarrhea, unspecified: Secondary | ICD-10-CM | POA: Diagnosis not present

## 2015-03-01 DIAGNOSIS — D125 Benign neoplasm of sigmoid colon: Secondary | ICD-10-CM | POA: Diagnosis not present

## 2015-03-01 DIAGNOSIS — R109 Unspecified abdominal pain: Secondary | ICD-10-CM

## 2015-03-01 DIAGNOSIS — E039 Hypothyroidism, unspecified: Secondary | ICD-10-CM | POA: Diagnosis not present

## 2015-03-01 DIAGNOSIS — E119 Type 2 diabetes mellitus without complications: Secondary | ICD-10-CM | POA: Diagnosis not present

## 2015-03-01 DIAGNOSIS — K5732 Diverticulitis of large intestine without perforation or abscess without bleeding: Secondary | ICD-10-CM | POA: Diagnosis not present

## 2015-03-01 LAB — GLUCOSE, CAPILLARY
Glucose-Capillary: 78 mg/dL (ref 65–99)
Glucose-Capillary: 87 mg/dL (ref 65–99)
Glucose-Capillary: 74 mg/dL (ref 65–99)

## 2015-03-01 MED ORDER — DEXTROSE 5 % IV SOLN: INTRAVENOUS | Status: DC

## 2015-03-01 NOTE — Progress Notes (Signed)
To recovery, report to TYrell, RN, VSS

## 2015-03-01 NOTE — Progress Notes (Signed)
Patient asymptomatic for hypoglycemia. Stating she does not take any medications yet for DM. Will start in am. Patient given 120 ml of cranberry juice prior to discharge

## 2015-03-01 NOTE — Progress Notes (Signed)
Blood sugar recheced at 1310 after D5 IV -87.

## 2015-03-01 NOTE — Progress Notes (Signed)
Peanut butter and crackers given to patient.

## 2015-03-01 NOTE — Patient Instructions (Signed)
Handout given ; Polyps   YOU HAD AN ENDOSCOPIC PROCEDURE TODAY AT [facility]:   Refer to the procedure report that was given to you for any specific questions about what was found during the examination.  If the procedure report does not answer your questions, please call your gastroenterologist to clarify.  If you requested that your care partner not be given the details of your procedure findings, then the procedure report has been included in a sealed envelope for you to review at your convenience later.  YOU SHOULD EXPECT: Some feelings of bloating in the abdomen. Passage of more gas than usual.  Walking can help get rid of the air that was put into your GI tract during the procedure and reduce the bloating. If you had a lower endoscopy (such as a colonoscopy or flexible sigmoidoscopy) you may notice spotting of blood in your stool or on the toilet paper. If you underwent a bowel prep for your procedure, you may not have a normal bowel movement for a few days.  Please Note:  You might notice some irritation and congestion in your nose or some drainage.  This is from the oxygen used during your procedure.  There is no need for concern and it should clear up in a day or so.  SYMPTOMS TO REPORT IMMEDIATELY:   Following lower endoscopy (colonoscopy or flexible sigmoidoscopy):  Excessive amounts of blood in the stool  Significant tenderness or worsening of abdominal pains  Swelling of the abdomen that is new, acute  Fever of 100F or higher  For urgent or emergent issues, a gastroenterologist can be reached at any hour by calling [phone].   DIET: Your first meal following the procedure should be a small meal and then it is ok to progress to your normal diet. Heavy or fried foods are harder to digest and may make you feel nauseous or bloated.  Likewise, meals heavy in dairy and vegetables can increase bloating.  Drink plenty of fluids but you should avoid alcoholic  beverages for 24 hours.  ACTIVITY:  You should plan to take it easy for the rest of today and you should NOT DRIVE or use heavy machinery until tomorrow (because of the sedation medicines used during the test).    FOLLOW UP: Our staff will call the number listed on your records the next business day following your procedure to check on you and address any questions or concerns that you may have regarding the information given to you following your procedure. If we do not reach you, we will leave a message.  However, if you are feeling well and you are not experiencing any problems, there is no need to return our call.  We will assume that you have returned to your regular daily activities without incident.  If any biopsies were taken you will be contacted by phone or by letter within the next 1-3 weeks.  Please call us at [phone] if you have not heard about the biopsies in 3 weeks.    SIGNATURES/CONFIDENTIALITY: You and/or your care partner have signed paperwork which will be entered into your electronic medical record.  These signatures attest to the fact that that the information above on your After Visit Summary has been reviewed and is understood.  Full responsibility of the confidentiality of this discharge information lies with you and/or your care-partner.

## 2015-03-01 NOTE — Progress Notes (Signed)
Called to room to assist during endoscopic procedure.  Patient ID and intended procedure confirmed with present staff. Received instructions for my participation in the procedure from the performing physician.  

## 2015-03-01 NOTE — Op Note (Signed)
[place]  [address] [place], [address]   COLONOSCOPY PROCEDURE REPORT  PATIENT: [name] [name], [name] [name]  MR#: [id number] BIRTHDATE: [birth year] , 69  yrs. old GENDER: female ENDOSCOPIST: Yetta Flock, MD REFERRED BY: PROCEDURE DATE:  03/01/2015 PROCEDURE:   Colonoscopy, diagnostic and Colonoscopy with biopsy First Screening Colonoscopy - Avg.  risk and is 50 yrs.  old or older - No.  Prior Negative Screening - Now for repeat screening. Less than 10 yrs Prior Negative Screening - Now for repeat screening.  Other: See Comments  History of Adenoma - Now for follow-up colonoscopy & has been > or = to 3 yrs.  N/A  Polyps removed today? Yes ASA CLASS:   Class III INDICATIONS:change in bowel habits, history of colon resection for diverticulosis, average risk. MEDICATIONS: Propofol 200 mg IV  DESCRIPTION OF PROCEDURE:   After the risks benefits and alternatives of the procedure were thoroughly explained, informed consent was obtained.  The digital rectal exam revealed no abnormalities of the rectum.   The LB 1528  endoscope was introduced through the anus and advanced to the terminal ileum which was intubated for a short distance. No adverse events experienced.   The quality of the prep was adequate  The instrument was then slowly withdrawn as the colon was fully examined. Estimated blood loss is zero unless otherwise noted in this procedure report.  COLON FINDINGS: There were two sessile polyps in the sigmoid colon roughly 65mm in size each, and both were removed with cold forceps. There was mild pancolonic diverticulosis noted.  The colonic mucosa was otherwise normal without inflammatory changes.  Biopsies were taken from the right and left colon to rule out microscopic colitis.  The terminal ileum was normal.  The surgical anastosmosis in the sigmoid colon was widely patent and appeared normal. Retroflexed views revealed internal hemorrhoids. The time to  cecum = 4.4 Withdrawal time = 12.2   The scope was withdrawn and the procedure completed. COMPLICATIONS: There were no immediate complications.  ENDOSCOPIC IMPRESSION: Two small sigmoid polyps, removed Normal appearing surgical anastomosis in the sigmoid colon Normal appearing colon without inflammatory changes, biopsies taken to rule out microscopic colitis Normal ileum  RECOMMENDATIONS: Await pathology results Resume diet Resume medications  eSigned:  Yetta Flock, MD 03/01/2015 3:54 PM   cc:  the patient   PATIENT NAME:  [name] [name], [name] [name] MR#: [id number]

## 2015-03-04 ENCOUNTER — Telehealth: Payer: Self-pay

## 2015-03-04 NOTE — Telephone Encounter (Signed)
  Follow up Call-  Call back number 02/12/2014  Post procedure Call Back phone  # [phone]  Permission to leave phone message Yes     Patient questions:  Do you have a fever, pain , or abdominal swelling? No. Pain Score  0 *  Have you tolerated food without any problems? Yes.    Have you been able to return to your normal activities? Yes.    Do you have any questions about your discharge instructions: Diet   No. Medications  No. Follow up visit  No.  Do you have questions or concerns about your Care? No.  Actions: * If pain score is 4 or above: No action needed, pain <4.

## 2015-03-13 ENCOUNTER — Encounter: Payer: Self-pay | Admitting: Family Medicine

## 2015-03-13 ENCOUNTER — Ambulatory Visit (INDEPENDENT_AMBULATORY_CARE_PROVIDER_SITE_OTHER): Payer: Medicare Other | Admitting: Family Medicine

## 2015-03-13 VITALS — BP 100/70 | HR 83 | Temp 98.2°F | Ht 64.0 in | Wt 144.4 lb

## 2015-03-13 DIAGNOSIS — J0110 Acute frontal sinusitis, unspecified: Secondary | ICD-10-CM

## 2015-03-13 MED ORDER — PROMETHAZINE-DM 6.25-15 MG/5ML PO SYRP: 5.0000 mL | ORAL_SOLUTION | Freq: Four times a day (QID) | ORAL | Status: DC | PRN

## 2015-03-13 MED ORDER — SULFAMETHOXAZOLE-TRIMETHOPRIM 800-160 MG PO TABS: 1.0000 | ORAL_TABLET | Freq: Two times a day (BID) | ORAL | Status: DC

## 2015-03-13 NOTE — Patient Instructions (Signed)
Follow up as needed Start the Bactrim twice daily- take w/ food Drink plenty of fluids REST! Use the cough syrup for nights/weekends (will cause drowsiness) and Mucinex DM for daytime cough w/o drowsiness Call with any questions or concerns Hang in there!!!

## 2015-03-13 NOTE — Progress Notes (Signed)
Pre visit review using our clinic review tool, if applicable. No additional management support is needed unless otherwise documented below in the visit note. 

## 2015-03-13 NOTE — Progress Notes (Signed)
   Subjective:    Patient ID: [name] [name], female    DOB: 27-Feb-1945, 70 y.o.   MRN: [id number]  HPI Cough- sxs started Sunday night w/ sore throat.  Developed cough on Monday.  Pt has chest congestion, SOB, pain w/ talking and coughing.  No fever.  + frontal sinus pressure.  + PND.  + HA.  No N/V.  L ear fullness.   Review of Systems For ROS see HPI     Objective:   Physical Exam  Constitutional: She appears well-developed and well-nourished. No distress.  HENT:  Head: Normocephalic and atraumatic.  Right Ear: Tympanic membrane normal.  Left Ear: Tympanic membrane normal.  Nose: Mucosal edema and rhinorrhea present. Right sinus exhibits frontal sinus tenderness. Right sinus exhibits no maxillary sinus tenderness. Left sinus exhibits frontal sinus tenderness. Left sinus exhibits no maxillary sinus tenderness.  Mouth/Throat: Uvula is midline and mucous membranes are normal. Posterior oropharyngeal erythema present. No oropharyngeal exudate.  Eyes: Conjunctivae and EOM are normal. Pupils are equal, round, and reactive to light.  Neck: Normal range of motion. Neck supple.  Cardiovascular: Normal rate, regular rhythm and normal heart sounds.   Pulmonary/Chest: Effort normal and breath sounds normal. No respiratory distress. She has no wheezes.  Lymphadenopathy:    She has no cervical adenopathy.  Vitals reviewed.         Assessment & Plan:

## 2015-03-13 NOTE — Assessment & Plan Note (Signed)
Pt's sxs and PE consistent w/ infxn.  Start abx.  Cough meds prn.  Reviewed supportive care and red flags that should prompt return.  Pt expressed understanding and is in agreement w/ plan.  

## 2015-04-09 ENCOUNTER — Encounter: Payer: Medicare Other | Admitting: Internal Medicine

## 2015-04-11 ENCOUNTER — Ambulatory Visit (INDEPENDENT_AMBULATORY_CARE_PROVIDER_SITE_OTHER): Payer: Medicare Other | Admitting: Family Medicine

## 2015-04-11 ENCOUNTER — Encounter: Payer: Self-pay | Admitting: Family Medicine

## 2015-04-11 VITALS — BP 120/78 | HR 78 | Temp 98.1°F | Resp 16 | Wt 145.2 lb

## 2015-04-11 DIAGNOSIS — E1142 Type 2 diabetes mellitus with diabetic polyneuropathy: Secondary | ICD-10-CM | POA: Diagnosis not present

## 2015-04-11 DIAGNOSIS — E038 Other specified hypothyroidism: Secondary | ICD-10-CM

## 2015-04-11 DIAGNOSIS — L600 Ingrowing nail: Secondary | ICD-10-CM | POA: Insufficient documentation

## 2015-04-11 DIAGNOSIS — E785 Hyperlipidemia, unspecified: Secondary | ICD-10-CM

## 2015-04-11 LAB — CBC WITH DIFFERENTIAL/PLATELET
Basophils Absolute: 0 10*3/uL (ref 0.0–0.1)
Basophils Relative: 0.5 % (ref 0.0–3.0)
Eosinophils Absolute: 0.1 10*3/uL (ref 0.0–0.7)
Eosinophils Relative: 1.4 % (ref 0.0–5.0)
HCT: 39.4 % (ref 36.0–46.0)
Hemoglobin: 13.1 g/dL (ref 12.0–15.0)
Lymphocytes Relative: 28.7 % (ref 12.0–46.0)
Lymphs Abs: 1.3 10*3/uL (ref 0.7–4.0)
MCHC: 33.3 g/dL (ref 30.0–36.0)
MCV: 90.8 fl (ref 78.0–100.0)
Monocytes Absolute: 0.4 10*3/uL (ref 0.1–1.0)
Monocytes Relative: 9.4 % (ref 3.0–12.0)
Neutro Abs: 2.7 10*3/uL (ref 1.4–7.7)
Neutrophils Relative %: 60 % (ref 43.0–77.0)
Platelets: 192 10*3/uL (ref 150.0–400.0)
RBC: 4.34 Mil/uL (ref 3.87–5.11)
RDW: 13.1 % (ref 11.5–15.5)
WBC: 4.4 10*3/uL (ref 4.0–10.5)

## 2015-04-11 LAB — BASIC METABOLIC PANEL
BUN: 25 mg/dL — ABNORMAL HIGH (ref 6–23)
CO2: 31 mEq/L (ref 19–32)
Calcium: 9.7 mg/dL (ref 8.4–10.5)
Chloride: 102 mEq/L (ref 96–112)
Creatinine, Ser: 0.81 mg/dL (ref 0.40–1.20)
GFR: 74.46 mL/min (ref >60.00)
Glucose, Bld: 124 mg/dL — ABNORMAL HIGH (ref 70–99)
Potassium: 4.4 mEq/L (ref 3.5–5.1)
Sodium: 140 mEq/L (ref 135–145)

## 2015-04-11 LAB — HEPATIC FUNCTION PANEL
ALT: 21 U/L (ref 0–35)
AST: 21 U/L (ref 0–37)
Albumin: 4.3 g/dL (ref 3.5–5.2)
Alkaline Phosphatase: 77 U/L (ref 39–117)
Bilirubin, Direct: 0.1 mg/dL (ref 0.0–0.3)
Total Bilirubin: 0.4 mg/dL (ref 0.2–1.2)
Total Protein: 7.3 g/dL (ref 6.0–8.3)

## 2015-04-11 LAB — MICROALBUMIN / CREATININE URINE RATIO
Creatinine,U: 100.6 mg/dL
Microalb Creat Ratio: 0.7 mg/g (ref 0.0–30.0)
Microalb, Ur: <0.7 mg/dL (ref 0.0–1.9)

## 2015-04-11 LAB — LIPID PANEL
Cholesterol: 171 mg/dL (ref 0–200)
HDL: 58.6 mg/dL (ref >39.00)
LDL Cholesterol: 87 mg/dL (ref 0–99)
NonHDL: 112.86
Total CHOL/HDL Ratio: 3
Triglycerides: 129 mg/dL (ref 0.0–149.0)
VLDL: 25.8 mg/dL (ref 0.0–40.0)

## 2015-04-11 LAB — TSH: TSH: 1.66 u[IU]/mL (ref 0.35–4.50)

## 2015-04-11 MED ORDER — OMEPRAZOLE 20 MG PO CPDR: 20.0000 mg | DELAYED_RELEASE_CAPSULE | Freq: Every day | ORAL | Status: DC

## 2015-04-11 MED ORDER — ALPRAZOLAM 0.5 MG PO TABS: 0.5000 mg | ORAL_TABLET | Freq: Every evening | ORAL | Status: DC | PRN

## 2015-04-11 MED ORDER — ROSUVASTATIN CALCIUM 10 MG PO TABS: 10.0000 mg | ORAL_TABLET | Freq: Every day | ORAL | Status: DC

## 2015-04-11 NOTE — Assessment & Plan Note (Signed)
Chronic problem.  Tolerating statin w/o difficulty.  Check labs.  Adjust meds prn  

## 2015-04-11 NOTE — Assessment & Plan Note (Signed)
Chronic problem.  Following w/ Dr Chalmers Cater.  UTD on foot exam, eye exam.  Due for microalbumin.  Not currently on medication.  Will continue to follow along and assist as able.

## 2015-04-11 NOTE — Patient Instructions (Signed)
Schedule your complete physical in 6 months We'll notify you of your lab results and make any changes if needed Keep up the good work on healthy diet and regular exercise- you look great! We'll call you with your podiatry appt for the ingrown toenail Call with any questions or concerns Welcome!  We're glad to have you!

## 2015-04-11 NOTE — Assessment & Plan Note (Signed)
Chronic problem.  On low dose Synthroid.  Asymptomatic at this time.  No recent TSH level- will order today and adjust meds prn.

## 2015-04-11 NOTE — Progress Notes (Signed)
Pre visit review using our clinic review tool, if applicable. No additional management support is needed unless otherwise documented below in the visit note. 

## 2015-04-11 NOTE — Assessment & Plan Note (Signed)
New.  No evidence of infection but given pt's neuropathy and DM will refer to podiatry for complete evaluation and tx.  Pt expressed understanding and is in agreement w/ plan.

## 2015-04-11 NOTE — Progress Notes (Signed)
   Subjective:    Patient ID: [name] [name], female    DOB: [birth year], 70 y.o.   MRN: [id number]  HPI New to establish.  Previous MD- Paz.    DM- chronic problem, following w/ Dr Chalmers Cater.  Last A1C 6.9.  UTD on foot exam and eye exam.  Due for microalbumin.  Currently diet controlled.  Previously on Januvia.  Pt has been told that 'i'm not a 1 and you're not a 2- you're right in the middle'.  Pt reports symptomatic lows if CBGs drop below 70.  Exercising regularly, 'i'm a gym rat'.  Hypothyroid- chronic problem, on Levothyroxine 71mcg daily.  Denies fatigue, changes to skin/hair/nails.  Hyperlipidemia- chronic problem, on Crestor 10mg  daily.  Denies CP, SOB, HAs, visual changes, abd pain, N/V, myalgias.  R great toe nail- pt reports she will have medial nail redness and tenderness intermittently  Review of Systems For ROS see HPI     Objective:   Physical Exam  Constitutional: She is oriented to person, place, and time. She appears well-developed and well-nourished. No distress.  HENT:  Head: Normocephalic and atraumatic.  Eyes: Conjunctivae and EOM are normal. Pupils are equal, round, and reactive to light.  Neck: Normal range of motion. Neck supple. No thyromegaly present.  Cardiovascular: Normal rate, regular rhythm, normal heart sounds and intact distal pulses.   No murmur heard. Pulmonary/Chest: Effort normal and breath sounds normal. No respiratory distress.  Abdominal: Soft. She exhibits no distension. There is no tenderness.  Musculoskeletal: She exhibits no edema.  Lymphadenopathy:    She has no cervical adenopathy.  Neurological: She is alert and oriented to person, place, and time.  Skin: Skin is warm and dry. There is erythema (mild erythema of medial edge of R great toenail).  Psychiatric: She has a normal mood and affect. Her behavior is normal.  Vitals reviewed.         Assessment & Plan:

## 2015-04-19 LAB — HM DIABETES EYE EXAM

## 2015-04-22 ENCOUNTER — Other Ambulatory Visit: Payer: Self-pay | Admitting: Neurology

## 2015-04-22 MED ORDER — AMITRIPTYLINE HCL 25 MG PO TABS: 25.0000 mg | ORAL_TABLET | Freq: Every day | ORAL | Status: DC

## 2015-04-22 NOTE — Telephone Encounter (Signed)
Patient called to request refill of amitriptyline (ELAVIL) 25 MG tablet

## 2015-04-26 DIAGNOSIS — Z87891 Personal history of nicotine dependence: Secondary | ICD-10-CM | POA: Diagnosis not present

## 2015-04-26 DIAGNOSIS — H269 Unspecified cataract: Secondary | ICD-10-CM | POA: Diagnosis not present

## 2015-04-26 DIAGNOSIS — H2513 Age-related nuclear cataract, bilateral: Secondary | ICD-10-CM | POA: Diagnosis not present

## 2015-04-26 DIAGNOSIS — E119 Type 2 diabetes mellitus without complications: Secondary | ICD-10-CM | POA: Diagnosis not present

## 2015-04-26 DIAGNOSIS — H04123 Dry eye syndrome of bilateral lacrimal glands: Secondary | ICD-10-CM | POA: Diagnosis not present

## 2015-04-30 DIAGNOSIS — L03031 Cellulitis of right toe: Secondary | ICD-10-CM | POA: Diagnosis not present

## 2015-04-30 DIAGNOSIS — E119 Type 2 diabetes mellitus without complications: Secondary | ICD-10-CM | POA: Diagnosis not present

## 2015-04-30 DIAGNOSIS — M79674 Pain in right toe(s): Secondary | ICD-10-CM | POA: Diagnosis not present

## 2015-04-30 DIAGNOSIS — L02611 Cutaneous abscess of right foot: Secondary | ICD-10-CM | POA: Diagnosis not present

## 2015-05-01 DIAGNOSIS — H04123 Dry eye syndrome of bilateral lacrimal glands: Secondary | ICD-10-CM | POA: Diagnosis not present

## 2015-05-01 DIAGNOSIS — H01021 Squamous blepharitis right upper eyelid: Secondary | ICD-10-CM | POA: Diagnosis not present

## 2015-05-01 DIAGNOSIS — H01024 Squamous blepharitis left upper eyelid: Secondary | ICD-10-CM | POA: Diagnosis not present

## 2015-05-01 DIAGNOSIS — H01022 Squamous blepharitis right lower eyelid: Secondary | ICD-10-CM | POA: Diagnosis not present

## 2015-05-01 DIAGNOSIS — H01025 Squamous blepharitis left lower eyelid: Secondary | ICD-10-CM | POA: Diagnosis not present

## 2015-05-01 DIAGNOSIS — H2513 Age-related nuclear cataract, bilateral: Secondary | ICD-10-CM | POA: Diagnosis not present

## 2015-05-14 DIAGNOSIS — M7742 Metatarsalgia, left foot: Secondary | ICD-10-CM | POA: Diagnosis not present

## 2015-05-14 DIAGNOSIS — E119 Type 2 diabetes mellitus without complications: Secondary | ICD-10-CM | POA: Diagnosis not present

## 2015-05-14 DIAGNOSIS — M7741 Metatarsalgia, right foot: Secondary | ICD-10-CM | POA: Diagnosis not present

## 2015-05-14 DIAGNOSIS — L03031 Cellulitis of right toe: Secondary | ICD-10-CM | POA: Diagnosis not present

## 2015-05-23 ENCOUNTER — Encounter: Payer: Self-pay | Admitting: Family Medicine

## 2015-05-23 ENCOUNTER — Ambulatory Visit (HOSPITAL_BASED_OUTPATIENT_CLINIC_OR_DEPARTMENT_OTHER)
Admission: RE | Admit: 2015-05-23 | Discharge: 2015-05-23 | Disposition: A | Discharge status: Home / Self Care | Payer: Medicare Other | Source: Ambulatory Visit | Attending: Family Medicine | Admitting: Family Medicine

## 2015-05-23 ENCOUNTER — Ambulatory Visit (INDEPENDENT_AMBULATORY_CARE_PROVIDER_SITE_OTHER): Payer: Medicare Other | Admitting: Family Medicine

## 2015-05-23 VITALS — BP 124/78 | HR 62 | Temp 98.6°F | Resp 16 | Ht 64.0 in | Wt 148.0 lb

## 2015-05-23 DIAGNOSIS — M79661 Pain in right lower leg: Secondary | ICD-10-CM

## 2015-05-23 MED ORDER — MELOXICAM 15 MG PO TABS: 15.0000 mg | ORAL_TABLET | Freq: Every day | ORAL | Status: DC

## 2015-05-23 MED ORDER — HYDROCODONE-ACETAMINOPHEN 5-325 MG PO TABS: 1.0000 | ORAL_TABLET | Freq: Four times a day (QID) | ORAL | Status: DC | PRN

## 2015-05-23 NOTE — Assessment & Plan Note (Signed)
New.  Pt is TTP over R posterior lower leg.  This is not consistent w/ an Achilles injury as she is able to bear weight more comfortably than sit down.  Will get [place] to r/o DVT.  Start daily NSAID and add pain meds prn.  If no DVT and no improvement over the weekend, plan is to refer to ortho or sports med for evaluation.  Pt expressed understanding and is in agreement w/ plan.

## 2015-05-23 NOTE — Progress Notes (Signed)
Pre visit review using our clinic review tool, if applicable. No additional management support is needed unless otherwise documented below in the visit note. 

## 2015-05-23 NOTE — Progress Notes (Signed)
   Subjective:    Patient ID: LOUISA MCCLENNEY, female    DOB: 1946/01/03, 70 y.o.   MRN: [id number]  HPI Leg pain- pt woke w/ Evlyn Clines horse in R leg 1 week ago.  This occurred 2 days after podiatry visit.  Did some stretching and took Advil w/o relief.  Pain has worsened as the week went on.  Pain will radiate from ankle up to lower thigh posteriorly.  Pt and husband denies redness or warmth.  Pain is the worst when sitting.  No pain in back or buttock.   Review of Systems For ROS see HPI     Objective:   Physical Exam  Constitutional: She is oriented to person, place, and time. She appears well-developed and well-nourished.  Obviously uncomfortable  HENT:  Head: Normocephalic and atraumatic.  Cardiovascular: Intact distal pulses.   Musculoskeletal: She exhibits tenderness (TTP over R lower leg just proximal to insertion of achilles). She exhibits no edema (no swelling of legs bilaterally).  Pt not able to sit down due to pain  Neurological: She is alert and oriented to person, place, and time. She has normal reflexes. Coordination normal.  Skin: Skin is warm and dry. No erythema.  Vitals reviewed.         Assessment & Plan:

## 2015-05-23 NOTE — Patient Instructions (Signed)
We'll notify you of your ultrasound results as soon as they are available Start the Mobic once daily- take w/ food Use the hydrocodone as needed for severe pain Alternate heat/ice for pain relief Call with any questions or concerns Hang in there!!!

## 2015-05-23 NOTE — Progress Notes (Signed)
Quick Note:  Called pt and left a detailed message to inform of results. ______

## 2015-05-28 DIAGNOSIS — L03031 Cellulitis of right toe: Secondary | ICD-10-CM | POA: Diagnosis not present

## 2015-05-28 DIAGNOSIS — S8264XS Nondisplaced fracture of lateral malleolus of right fibula, sequela: Secondary | ICD-10-CM | POA: Diagnosis not present

## 2015-05-28 DIAGNOSIS — M25571 Pain in right ankle and joints of right foot: Secondary | ICD-10-CM | POA: Diagnosis not present

## 2015-05-29 ENCOUNTER — Other Ambulatory Visit: Payer: Self-pay | Admitting: Podiatry

## 2015-05-29 DIAGNOSIS — S82891A Other fracture of right lower leg, initial encounter for closed fracture: Secondary | ICD-10-CM

## 2015-05-30 DIAGNOSIS — E7800 Pure hypercholesterolemia, unspecified: Secondary | ICD-10-CM | POA: Diagnosis not present

## 2015-05-30 DIAGNOSIS — E1142 Type 2 diabetes mellitus with diabetic polyneuropathy: Secondary | ICD-10-CM | POA: Diagnosis not present

## 2015-05-30 DIAGNOSIS — N189 Chronic kidney disease, unspecified: Secondary | ICD-10-CM | POA: Diagnosis not present

## 2015-05-30 DIAGNOSIS — G609 Hereditary and idiopathic neuropathy, unspecified: Secondary | ICD-10-CM | POA: Diagnosis not present

## 2015-06-05 DIAGNOSIS — L02612 Cutaneous abscess of left foot: Secondary | ICD-10-CM | POA: Diagnosis not present

## 2015-06-05 DIAGNOSIS — L03032 Cellulitis of left toe: Secondary | ICD-10-CM | POA: Diagnosis not present

## 2015-06-05 DIAGNOSIS — M79675 Pain in left toe(s): Secondary | ICD-10-CM | POA: Diagnosis not present

## 2015-06-05 DIAGNOSIS — M25571 Pain in right ankle and joints of right foot: Secondary | ICD-10-CM | POA: Diagnosis not present

## 2015-06-05 DIAGNOSIS — M25572 Pain in left ankle and joints of left foot: Secondary | ICD-10-CM | POA: Diagnosis not present

## 2015-06-06 ENCOUNTER — Ambulatory Visit
Admission: RE | Admit: 2015-06-06 | Discharge: 2015-06-06 | Disposition: A | Discharge status: Home / Self Care | Payer: Medicare Other | Source: Ambulatory Visit | Attending: Podiatry | Admitting: Podiatry

## 2015-06-06 DIAGNOSIS — S82891A Other fracture of right lower leg, initial encounter for closed fracture: Secondary | ICD-10-CM

## 2015-06-06 DIAGNOSIS — S96211A Strain of intrinsic muscle and tendon at ankle and foot level, right foot, initial encounter: Secondary | ICD-10-CM | POA: Diagnosis not present

## 2015-06-08 ENCOUNTER — Other Ambulatory Visit: Payer: Self-pay | Admitting: Neurology

## 2015-06-12 DIAGNOSIS — L03032 Cellulitis of left toe: Secondary | ICD-10-CM | POA: Diagnosis not present

## 2015-06-12 DIAGNOSIS — S86391D Other injury of muscle(s) and tendon(s) of peroneal muscle group at lower leg level, right leg, subsequent encounter: Secondary | ICD-10-CM | POA: Diagnosis not present

## 2015-06-12 DIAGNOSIS — M25571 Pain in right ankle and joints of right foot: Secondary | ICD-10-CM | POA: Diagnosis not present

## 2015-06-12 DIAGNOSIS — M25572 Pain in left ankle and joints of left foot: Secondary | ICD-10-CM | POA: Diagnosis not present

## 2015-06-19 DIAGNOSIS — L03032 Cellulitis of left toe: Secondary | ICD-10-CM | POA: Diagnosis not present

## 2015-06-19 DIAGNOSIS — S86311D Strain of muscle(s) and tendon(s) of peroneal muscle group at lower leg level, right leg, subsequent encounter: Secondary | ICD-10-CM | POA: Diagnosis not present

## 2015-06-24 ENCOUNTER — Telehealth: Payer: Self-pay | Admitting: Family Medicine

## 2015-06-24 DIAGNOSIS — M25572 Pain in left ankle and joints of left foot: Secondary | ICD-10-CM | POA: Diagnosis not present

## 2015-06-24 DIAGNOSIS — M25571 Pain in right ankle and joints of right foot: Secondary | ICD-10-CM | POA: Diagnosis not present

## 2015-06-24 DIAGNOSIS — S86391D Other injury of muscle(s) and tendon(s) of peroneal muscle group at lower leg level, right leg, subsequent encounter: Secondary | ICD-10-CM | POA: Diagnosis not present

## 2015-06-24 DIAGNOSIS — Z01818 Encounter for other preprocedural examination: Secondary | ICD-10-CM

## 2015-06-24 DIAGNOSIS — Z79899 Other long term (current) drug therapy: Secondary | ICD-10-CM | POA: Diagnosis not present

## 2015-06-24 NOTE — Telephone Encounter (Signed)
Pt has been scheduled.  °

## 2015-06-24 NOTE — Telephone Encounter (Signed)
Per PCP she will have to contact surgeon for these.

## 2015-06-24 NOTE — Telephone Encounter (Signed)
Pt called back stating her podiatrist is not able to write Rx per medicare and asking if anyway Dr Birdie Riddle would do this for her. She is wanting to take the Rx  to [facility].

## 2015-06-24 NOTE — Telephone Encounter (Signed)
Please call and schedule pt for lab appt

## 2015-06-24 NOTE — Telephone Encounter (Signed)
Lab orders had included CBC, BMP, and glucose

## 2015-06-24 NOTE — Telephone Encounter (Signed)
Pt states that she needs an Rx for crutches for after her surgery, pt uses [facility] in [place]

## 2015-06-24 NOTE — Telephone Encounter (Signed)
Ok for CBC and BMP for surgical clearance

## 2015-06-24 NOTE — Telephone Encounter (Signed)
rx placed at front desk for pick up, when pt comes in for labs.

## 2015-06-24 NOTE — Telephone Encounter (Signed)
[place] prescription completed for pt

## 2015-06-24 NOTE — Telephone Encounter (Signed)
Pt came by asking if she could have labs drawn for Dr Solmon Ice office, pt has order, pt states that she is to have surgery and needs this done by tomorrow morning if possible.

## 2015-06-25 ENCOUNTER — Other Ambulatory Visit (INDEPENDENT_AMBULATORY_CARE_PROVIDER_SITE_OTHER): Payer: Medicare Other

## 2015-06-25 DIAGNOSIS — Z01818 Encounter for other preprocedural examination: Secondary | ICD-10-CM

## 2015-06-25 LAB — CBC WITH DIFFERENTIAL/PLATELET
Basophils Absolute: 0 10*3/uL (ref 0.0–0.1)
Basophils Relative: 0.5 % (ref 0.0–3.0)
Eosinophils Absolute: 0.1 10*3/uL (ref 0.0–0.7)
Eosinophils Relative: 1.6 % (ref 0.0–5.0)
HCT: 40 % (ref 36.0–46.0)
Hemoglobin: 13.2 g/dL (ref 12.0–15.0)
Lymphocytes Relative: 27 % (ref 12.0–46.0)
Lymphs Abs: 1.4 10*3/uL (ref 0.7–4.0)
MCHC: 33 g/dL (ref 30.0–36.0)
MCV: 90.8 fl (ref 78.0–100.0)
Monocytes Absolute: 0.5 10*3/uL (ref 0.1–1.0)
Monocytes Relative: 10.1 % (ref 3.0–12.0)
Neutro Abs: 3.2 10*3/uL (ref 1.4–7.7)
Neutrophils Relative %: 60.8 % (ref 43.0–77.0)
Platelets: 213 10*3/uL (ref 150.0–400.0)
RBC: 4.41 Mil/uL (ref 3.87–5.11)
RDW: 14 % (ref 11.5–15.5)
WBC: 5.2 10*3/uL (ref 4.0–10.5)

## 2015-06-25 LAB — BASIC METABOLIC PANEL
BUN: 20 mg/dL (ref 6–23)
CO2: 32 mEq/L (ref 19–32)
Calcium: 9.7 mg/dL (ref 8.4–10.5)
Chloride: 102 mEq/L (ref 96–112)
Creatinine, Ser: 0.87 mg/dL (ref 0.40–1.20)
GFR: 68.53 mL/min (ref >60.00)
Glucose, Bld: 145 mg/dL — ABNORMAL HIGH (ref 70–99)
Potassium: 4.1 mEq/L (ref 3.5–5.1)
Sodium: 140 mEq/L (ref 135–145)

## 2015-06-26 NOTE — Telephone Encounter (Signed)
Noted  

## 2015-06-26 NOTE — Telephone Encounter (Signed)
Patient's husband walked in to office on behalf of patient requesting to:  1.Have lab results faxed to Dr. Roxana Hires office.  Labs were faxed to [phone].   2. Let Dr. Birdie Riddle know patient reviewed lab results on mychart, please note she had her A1C checked by Dr. Chalmers Cater last month and it was 6.8.  She thinks her sugars are elevated due to stress.

## 2015-06-26 NOTE — Telephone Encounter (Signed)
Noted.  Please cancel A1C since pt sees Dr Chalmers Cater

## 2015-07-01 DIAGNOSIS — S86391D Other injury of muscle(s) and tendon(s) of peroneal muscle group at lower leg level, right leg, subsequent encounter: Secondary | ICD-10-CM | POA: Diagnosis not present

## 2015-07-01 HISTORY — PX: ANKLE SURGERY: SHX546

## 2015-07-08 DIAGNOSIS — S86391D Other injury of muscle(s) and tendon(s) of peroneal muscle group at lower leg level, right leg, subsequent encounter: Secondary | ICD-10-CM | POA: Diagnosis not present

## 2015-07-22 DIAGNOSIS — S86391D Other injury of muscle(s) and tendon(s) of peroneal muscle group at lower leg level, right leg, subsequent encounter: Secondary | ICD-10-CM | POA: Diagnosis not present

## 2015-07-25 ENCOUNTER — Other Ambulatory Visit: Payer: Self-pay | Admitting: Obstetrics and Gynecology

## 2015-07-25 DIAGNOSIS — Z1231 Encounter for screening mammogram for malignant neoplasm of breast: Secondary | ICD-10-CM

## 2015-08-27 DIAGNOSIS — S86391D Other injury of muscle(s) and tendon(s) of peroneal muscle group at lower leg level, right leg, subsequent encounter: Secondary | ICD-10-CM | POA: Diagnosis not present

## 2015-08-29 DIAGNOSIS — S86391D Other injury of muscle(s) and tendon(s) of peroneal muscle group at lower leg level, right leg, subsequent encounter: Secondary | ICD-10-CM | POA: Diagnosis not present

## 2015-09-03 DIAGNOSIS — S86391D Other injury of muscle(s) and tendon(s) of peroneal muscle group at lower leg level, right leg, subsequent encounter: Secondary | ICD-10-CM | POA: Diagnosis not present

## 2015-09-05 ENCOUNTER — Ambulatory Visit (INDEPENDENT_AMBULATORY_CARE_PROVIDER_SITE_OTHER): Payer: Medicare Other | Admitting: Nurse Practitioner

## 2015-09-05 ENCOUNTER — Encounter: Payer: Self-pay | Admitting: Nurse Practitioner

## 2015-09-05 VITALS — BP 113/73 | HR 76 | Ht 64.0 in | Wt 146.2 lb

## 2015-09-05 DIAGNOSIS — G500 Trigeminal neuralgia: Secondary | ICD-10-CM

## 2015-09-05 DIAGNOSIS — S86391D Other injury of muscle(s) and tendon(s) of peroneal muscle group at lower leg level, right leg, subsequent encounter: Secondary | ICD-10-CM | POA: Diagnosis not present

## 2015-09-05 MED ORDER — PROPRANOLOL HCL 10 MG PO TABS: ORAL_TABLET | ORAL | 3 refills | Status: DC

## 2015-09-05 MED ORDER — GABAPENTIN 300 MG PO CAPS: ORAL_CAPSULE | ORAL | 3 refills | Status: DC

## 2015-09-05 MED ORDER — AMITRIPTYLINE HCL 25 MG PO TABS: 25.0000 mg | ORAL_TABLET | Freq: Every day | ORAL | 11 refills | Status: DC

## 2015-09-05 NOTE — Progress Notes (Signed)
I have read the note, and I agree with the clinical assessment and plan.  Chayce Rullo KEITH   

## 2015-09-05 NOTE — Progress Notes (Signed)
Received fax confirmation inderal. [id number]. ssy

## 2015-09-05 NOTE — Patient Instructions (Signed)
Continue gabapentin at current dose will refill Continue Inderal at current dose will refill Continue Elavil at current dose will refill Follow-up yearly and when necessary

## 2015-09-05 NOTE — Progress Notes (Signed)
[facility]  PATIENT: [name] [name] DOB: 05-24-1945   REASON FOR VISIT: Follow-up for trigeminal neuralgia, cervical spondylosis without myelopathy HISTORY FROM: Patient    HISTORY OF PRESENT ILLNESS: [name] [name] is a 70 year old right-handed white female with a history of cervical spondylosis and cervicogenic headache and  trigeminal neuralgia. The patient  does fairly well with a combination of propranolol and amitriptyline and gabapentin for her headaches and facial pain. The patient takes only 10 mg twice daily of the propranolol, she takes 25 mg at night of the amitriptyline. The patient has tried to cut back on amitriptyline because she has dry eyes , but her symptoms severely worsened, and she cannot tolerate the dose decrease. She is also on gabapentin 300 mg 1 in the morning and 2 every evening The patient returns to this office for further evaluation. She has noted some gait instability at times, she will stumble since her recent tendon  surgery. She has a boot in  place on the right. She is currently receiving physical therapy 2 times a week   REVIEW OF SYSTEMS: Full 14 system review of systems performed and notable only for those listed, all others are neg:  Constitutional: neg  Cardiovascular: neg Ear/Nose/Throat: neg  Skin: neg Eyes: neg Respiratory: neg Gastroitestinal: neg  Hematology/Lymphatic: neg  Endocrine: neg Musculoskeletal: Gait difficulty Allergy/Immunology: neg Neurological: neg Psychiatric: neg Sleep : neg   ALLERGIES: Allergies  Allergen Reactions  . Rocephin [Ceftriaxone] Anaphylaxis  . Benadryl [Diphenhydramine Hcl] Anxiety    Hyperactivity    HOME MEDICATIONS: Outpatient Medications Prior to Visit  Medication Sig Dispense Refill  . ALPRAZolam (XANAX) 0.5 MG tablet Take 1 tablet (0.5 mg total) by mouth at bedtime as needed for sleep. 90 tablet 1  . amitriptyline (ELAVIL) 25 MG tablet Take 1 tablet (25 mg total) by  mouth at bedtime. 30 tablet 4  . Cholecalciferol (VITAMIN D3) 2000 UNITS TABS Take 1 tablet by mouth daily.     [name] gabapentin (NEURONTIN) 300 MG capsule TAKE 1 CAPSULE BY MOUTH EVERY MORNING AND 2 CAPSULES EVERY EVENING 270 capsule 2  . hyoscyamine (LEVSIN/SL) 0.125 MG SL tablet Place 1 tablet (0.125 mg total) under the tongue every 6 (six) hours as needed for cramping. 100 tablet 1  . levothyroxine (SYNTHROID) 25 MCG tablet Take 25 mcg by mouth daily. Friday, Saturday and Sunday-  31mcg    . Multiple Vitamin (MULTIVITAMIN) tablet Take 1 tablet by mouth daily.      [name] omeprazole (PRILOSEC) 20 MG capsule Take 1 capsule (20 mg total) by mouth daily. 90 capsule 1  . propranolol (INDERAL) 10 MG tablet One tablet in the morning and 2 tablets at night (Patient taking differently: Take 10 mg by mouth 2 (two) times daily. One tablet in the morning and 1 tablet at night) 270 tablet 3  . rosuvastatin (CRESTOR) 10 MG tablet Take 1 tablet (10 mg total) by mouth daily. 90 tablet 1  . HYDROcodone-acetaminophen (NORCO/VICODIN) 5-325 MG tablet Take 1 tablet by mouth every 6 (six) hours as needed for moderate pain. (Patient not taking: Reported on 09/05/2015) 30 tablet 0  . meloxicam (MOBIC) 15 MG tablet Take 1 tablet (15 mg total) by mouth daily. (Patient not taking: Reported on 09/05/2015) 30 tablet 1   No facility-administered medications prior to visit.     PAST MEDICAL HISTORY: Past Medical History:  Diagnosis Date  . Abdominal pain    EGD and CTs neg, rx Amitriptiline (non ulcer dyspepsia) EGD  12/11.. s/p ballon dilitation  . Anxiety   . Cervical spondylosis without myelopathy 11/30/2012  . Chronic conjunctivitis   . Diabetes mellitus    no meds, diet controlled  . Diverticulosis of colon   . GERD with stricture    s/p dilatations  . History of Lyme disease   . HOH (hard of hearing)    bilateral hearing aids  . Hyperlipidemia   . Hypothyroidism     Dx 10-9 TSH 5.2 , then TSH normalized, eventually  went up again 2012  . Melanoma ([place])    h/o  . Memory difficulties 06/05/2014  . Osteopenia   . Solar lentigo    face; s/p excision 3/09. See Derm q 6 months  . Trigeminal neuralgia    h/o     PAST SURGICAL HISTORY: Past Surgical History:  Procedure Laterality Date  . ABDOMINAL HYSTERECTOMY    . ANKLE SURGERY     R andkle tendon surgery  . CERVICAL FUSION    . CHOLECYSTECTOMY    . COLON RESECTION  1999   d/t diverticulitis  . COLON SURGERY    . melanoma resection Right     Right Arm Surgery, R facial surgery (05-2012)   . PTX s/t chest tube    . SHOULDER SURGERY     right  . WRIST SURGERY     removed melaoma, right    FAMILY HISTORY: Family History  Problem Relation Age of Onset  . Prostate cancer Father   . Endometrial cancer Sister     clear cell carcinoma  . Heart attack Paternal Grandfather     GF in his 97s  . Melanoma Maternal Uncle   . Colon cancer Neg Hx   . Breast cancer Neg Hx   . Diabetes Neg Hx   . Stomach cancer Neg Hx   . Rectal cancer Neg Hx     SOCIAL HISTORY: Social History   Social History  . Marital status: Married    Spouse name: Percell Miller  . Number of children: 2  . Years of education: college   Occupational History  . retired Retired   Social History Main Topics  . Smoking status: Former Smoker    Start date: 01/20/1976  . Smokeless tobacco: Never Used  . Alcohol use 1.2 - 1.8 oz/week    2 - 3 Glasses of wine per week  . Drug use: No  . Sexual activity: Not on file   Other Topics Concern  . Not on file   Social History Narrative   Patient is married with 2 children.   Patient is right handed.   Patient has college education.   Patient does not drink caffeine.            PHYSICAL EXAM  Vitals:   09/05/15 0806  BP: 113/73  Pulse: 76  Weight: 146 lb 3.2 oz (66.3 kg)  Height: 5\' 4"  (1.626 m)   Body mass index is 25.1 kg/m.  Generalized: Well developed, in no acute distress  Head: normocephalic and atraumatic,.  Oropharynx benign  Musculoskeletal: She lacks about 20 of lateral rotation of the cervical spine bilaterally   Neurological examination   Mentation: Alert oriented to time, place, history taking. Attention span and concentration appropriate. Recent and remote memory intact.  Follows all commands speech and language fluent.   Cranial nerve II-XII: Pupils were equal round reactive to light extraocular movements were full, visual field were full on confrontational test. Facial sensation and strength were normal. hearing was  intact to finger rubbing bilaterally. Uvula tongue midline. head turning and shoulder shrug were normal and symmetric.Tongue protrusion into cheek strength was normal. Motor: normal bulk and tone, full strength in the BUE, BLE, fine finger movements normal, no pronator drift. No focal weakness Sensory: normal and symmetric to light touch, pinprick, and  Vibration,  Coordination: finger-nose-finger, heel-to-shin bilaterally, no dysmetria Reflexes: Symmetric upper and lower plantar responses were flexor bilaterally. Gait and Station: Rising up from seated position without assistance, normal stance,  moderate stride, good arm swing, smooth turning, boot in  place right lower extremity   DIAGNOSTIC DATA (LABS, IMAGING, TESTING) - I reviewed patient records, labs, notes, testing and imaging myself where available.  Lab Results  Component Value Date   WBC 5.2 06/25/2015   HGB 13.2 06/25/2015   HCT 40.0 06/25/2015   MCV 90.8 06/25/2015   PLT 213.0 06/25/2015      Component Value Date/Time   NA 140 06/25/2015 0917   NA 143 03/12/2014   NA 143 03/12/2014   K 4.1 06/25/2015 0917   CL 102 06/25/2015 0917   CO2 32 06/25/2015 0917   GLUCOSE 145 (H) 06/25/2015 0917   GLUCOSE 126 (H) 11/11/2005 0831   BUN 20 06/25/2015 0917   BUN 27 (A) 03/12/2014   CREATININE 0.87 06/25/2015 0917   CALCIUM 9.7 06/25/2015 0917   PROT 7.3 04/11/2015 0944   ALBUMIN 4.3 04/11/2015 0944   AST  21 04/11/2015 0944   ALT 21 04/11/2015 0944   ALKPHOS 77 04/11/2015 0944   BILITOT 0.4 04/11/2015 0944   GFRNONAA 84 (L) 01/13/2014 0435   GFRAA >90 01/13/2014 0435   Lab Results  Component Value Date   CHOL 171 04/11/2015   HDL 58.60 04/11/2015   LDLCALC 87 04/11/2015   LDLDIRECT 148.1 06/11/2006   TRIG 129.0 04/11/2015   CHOLHDL 3 04/11/2015   Lab Results  Component Value Date   HGBA1C 6.9 01/23/2015    Lab Results  Component Value Date   TSH 1.66 04/11/2015      ASSESSMENT AND PLAN  70 y.o. year old female  has a past medical history of ; Cervical spondylosis without myelopathy (11/30/2012); Trigeminal neuralgia. Mild gait instability.  PLAN: Continue gabapentin at current dose will refill Continue Inderal at current dose will refill Continue Elavil at current dose will refill Follow-up yearly and when necessary Dennie Bible, [facility], [facility], APRN  [facility] [address] [place], [place] [address] [phone]

## 2015-09-06 DIAGNOSIS — D1801 Hemangioma of skin and subcutaneous tissue: Secondary | ICD-10-CM | POA: Diagnosis not present

## 2015-09-06 DIAGNOSIS — L438 Other lichen planus: Secondary | ICD-10-CM | POA: Diagnosis not present

## 2015-09-06 DIAGNOSIS — D485 Neoplasm of uncertain behavior of skin: Secondary | ICD-10-CM | POA: Diagnosis not present

## 2015-09-06 DIAGNOSIS — L570 Actinic keratosis: Secondary | ICD-10-CM | POA: Diagnosis not present

## 2015-09-06 DIAGNOSIS — D225 Melanocytic nevi of trunk: Secondary | ICD-10-CM | POA: Diagnosis not present

## 2015-09-10 DIAGNOSIS — S86391D Other injury of muscle(s) and tendon(s) of peroneal muscle group at lower leg level, right leg, subsequent encounter: Secondary | ICD-10-CM | POA: Diagnosis not present

## 2015-09-12 DIAGNOSIS — S86391D Other injury of muscle(s) and tendon(s) of peroneal muscle group at lower leg level, right leg, subsequent encounter: Secondary | ICD-10-CM | POA: Diagnosis not present

## 2015-10-02 ENCOUNTER — Ambulatory Visit: Payer: Medicare Other | Admitting: Family Medicine

## 2015-10-03 ENCOUNTER — Encounter: Payer: Self-pay | Admitting: Family Medicine

## 2015-10-03 ENCOUNTER — Ambulatory Visit (INDEPENDENT_AMBULATORY_CARE_PROVIDER_SITE_OTHER): Payer: Medicare Other | Admitting: Family Medicine

## 2015-10-03 VITALS — BP 112/82 | HR 84 | Temp 98.1°F | Resp 16 | Ht 64.0 in | Wt 146.5 lb

## 2015-10-03 DIAGNOSIS — E1142 Type 2 diabetes mellitus with diabetic polyneuropathy: Secondary | ICD-10-CM | POA: Diagnosis not present

## 2015-10-03 DIAGNOSIS — E785 Hyperlipidemia, unspecified: Secondary | ICD-10-CM | POA: Diagnosis not present

## 2015-10-03 DIAGNOSIS — E038 Other specified hypothyroidism: Secondary | ICD-10-CM | POA: Diagnosis not present

## 2015-10-03 DIAGNOSIS — Z0000 Encounter for general adult medical examination without abnormal findings: Secondary | ICD-10-CM

## 2015-10-03 DIAGNOSIS — M8580 Other specified disorders of bone density and structure, unspecified site: Secondary | ICD-10-CM | POA: Diagnosis not present

## 2015-10-03 DIAGNOSIS — Z1231 Encounter for screening mammogram for malignant neoplasm of breast: Secondary | ICD-10-CM

## 2015-10-03 DIAGNOSIS — Z23 Encounter for immunization: Secondary | ICD-10-CM

## 2015-10-03 MED ORDER — ROSUVASTATIN CALCIUM 10 MG PO TABS: 10.0000 mg | ORAL_TABLET | Freq: Every day | ORAL | 1 refills | Status: DC

## 2015-10-03 MED ORDER — ALPRAZOLAM 0.5 MG PO TABS: 0.5000 mg | ORAL_TABLET | Freq: Every evening | ORAL | 1 refills | Status: DC | PRN

## 2015-10-03 MED ORDER — OMEPRAZOLE 20 MG PO CPDR: 20.0000 mg | DELAYED_RELEASE_CAPSULE | Freq: Every day | ORAL | 1 refills | Status: DC

## 2015-10-03 NOTE — Assessment & Plan Note (Signed)
Chronic problem.  Following w/ Dr Chalmers Cater.  Diet controlled.  UTD on microalbumin.  Due for eye exam later this fall.  UTD on foot exam.  Pt is asking for labs ahead of her upcoming appt.  Will follow.

## 2015-10-03 NOTE — Progress Notes (Signed)
   Subjective:    Patient ID: [name] [name], female    DOB: 05-30-1945, 70 y.o.   MRN: [id number]  HPI Here today for CPE.  Risk Factors: Hyperlipidemia- chronic problem, on Crestor daily.  Denies abd pain, N/V, myalgias. Hypothyroid- chronic problem, on Levothyroxine daily.  Denies fatigue, cold/heat intolerance, changes to skin/hair/nails DM- chronic problem, currently diet controlled.  Exercising regularly.  UTD on eye exam, due November.  Foot exam done.  UTD on microalbumin.  Following w/ Dr Chalmers Cater Osteopenia- chronic problem, on Ca and Vit D daily.  Due for DEXA Physical Activity: exercising regularly Fall Risk: low Depression: denies current sxs Hearing: normal to conversational tones and whispered voice at 6 ft w/ hearing aides ADL's: independent Cognitive: normal linear thought process, memory and attention intact Home Safety: safe at home, lives w/ husband Height, Weight, BMI, Visual Acuity: see vitals, vision corrected to 20/20 w/ glasses Counseling: UTD on colonoscopy, immunizations.  Due for DEXA, [facility]o ([facility] Care team reviewed and updated Labs Ordered: See A&P Care Plan: See A&P    Review of Systems Patient reports no vision/ hearing changes, adenopathy,fever, weight change,  persistant/recurrent hoarseness , swallowing issues, chest pain, palpitations, edema, persistant/recurrent cough, hemoptysis, dyspnea (rest/exertional/paroxysmal nocturnal), gastrointestinal bleeding (melena, rectal bleeding), abdominal pain, significant heartburn, bowel changes, GU symptoms (dysuria, hematuria, incontinence), Gyn symptoms (abnormal  bleeding, pain),  syncope, focal weakness, memory loss, numbness & tingling, skin/hair/nail changes, abnormal bruising or bleeding, anxiety, or depression.     Objective:   Physical Exam General Appearance:    Alert, cooperative, no distress, appears stated age  Head:    Normocephalic, without obvious abnormality, atraumatic    Eyes:    PERRL, conjunctiva/corneas clear, EOM's intact, fundi    benign, both eyes  Ears:    Normal TM's and external ear canals, both ears  Nose:   Nares normal, septum midline, mucosa normal, no drainage    or sinus tenderness  Throat:   Lips, mucosa, and tongue normal; teeth and gums normal  Neck:   Supple, symmetrical, trachea midline, no adenopathy;    Thyroid: no enlargement/tenderness/nodules  Back:     Symmetric, no curvature, ROM normal, no CVA tenderness  Lungs:     Clear to auscultation bilaterally, respirations unlabored  Chest Wall:    No tenderness or deformity   Heart:    Regular rate and rhythm, S1 and S2 normal, no murmur, rub   or gallop  Breast Exam:    Deferred to GYN  Abdomen:     Soft, non-tender, bowel sounds active all four quadrants,    no masses, no organomegaly  Genitalia:    Deferred to GYN  Rectal:    Extremities:   Extremities normal, atraumatic, no cyanosis or edema  Pulses:   2+ and symmetric all extremities  Skin:   Skin color, texture, turgor normal, no rashes or lesions  Lymph nodes:   Cervical, supraclavicular, and axillary nodes normal  Neurologic:   CNII-XII intact, normal strength, sensation and reflexes    throughout          Assessment & Plan:

## 2015-10-03 NOTE — Assessment & Plan Note (Signed)
Chronic problem.  Following w/ Dr Chalmers Cater.  She is currently asymptomatic but requesting labs done ahead of her upcoming appt.

## 2015-10-03 NOTE — Assessment & Plan Note (Signed)
Chronic problem.  Tolerating statin w/o difficulty.  Check labs.  Adjust meds prn  

## 2015-10-03 NOTE — Assessment & Plan Note (Signed)
Chronic problem.  On Ca and Vit D.  Overdue for DEXA- ordered.  Check Vit D level.  Replete prn.  Pt expressed understanding and is in agreement w/ plan.

## 2015-10-03 NOTE — Progress Notes (Signed)
Pre visit review using our clinic review tool, if applicable. No additional management support is needed unless otherwise documented below in the visit note. 

## 2015-10-03 NOTE — Assessment & Plan Note (Signed)
Pt's PE WNL.  UTD on colonoscopy, immunizations.  Due for mammo- scheduled.  Due for DEXA.  Written screening schedule updated and given to pt.  Check labs.  Anticipatory guidance provided.

## 2015-10-03 NOTE — Patient Instructions (Addendum)
Follow up in 6 months to recheck cholesterol We'll notify you of your lab results and make any changes if needed You are up to date on colonoscopy- yay! You have your mammogram scheduled and we're going to try and add the bone density on that day You are due for your eye exam in Oct/Nov Keep up the good work on healthy diet and regular exercise- you look great! Call with any questions or concerns Happy Fall!!!

## 2015-10-07 ENCOUNTER — Other Ambulatory Visit (INDEPENDENT_AMBULATORY_CARE_PROVIDER_SITE_OTHER): Payer: Medicare Other | Admitting: Emergency Medicine

## 2015-10-07 DIAGNOSIS — E1142 Type 2 diabetes mellitus with diabetic polyneuropathy: Secondary | ICD-10-CM | POA: Diagnosis not present

## 2015-10-07 LAB — BASIC METABOLIC PANEL
BUN: 18 mg/dL (ref 6–23)
CO2: 31 mEq/L (ref 19–32)
Calcium: 9.6 mg/dL (ref 8.4–10.5)
Chloride: 100 mEq/L (ref 96–112)
Creatinine, Ser: 0.85 mg/dL (ref 0.40–1.20)
GFR: 70.33 mL/min (ref >60.00)
Glucose, Bld: 126 mg/dL — ABNORMAL HIGH (ref 70–99)
Potassium: 4.1 mEq/L (ref 3.5–5.1)
Sodium: 139 mEq/L (ref 135–145)

## 2015-10-07 LAB — HEPATIC FUNCTION PANEL
ALT: 18 U/L (ref 0–35)
AST: 20 U/L (ref 0–37)
Albumin: 4.4 g/dL (ref 3.5–5.2)
Alkaline Phosphatase: 109 U/L (ref 39–117)
Bilirubin, Direct: 0.1 mg/dL (ref 0.0–0.3)
Total Bilirubin: 0.4 mg/dL (ref 0.2–1.2)
Total Protein: 7.2 g/dL (ref 6.0–8.3)

## 2015-10-07 LAB — CBC WITH DIFFERENTIAL/PLATELET
Basophils Absolute: 0 10*3/uL (ref 0.0–0.1)
Basophils Relative: 0.4 % (ref 0.0–3.0)
Eosinophils Absolute: 0.1 10*3/uL (ref 0.0–0.7)
Eosinophils Relative: 1.4 % (ref 0.0–5.0)
HCT: 42.1 % (ref 36.0–46.0)
Hemoglobin: 14 g/dL (ref 12.0–15.0)
Lymphocytes Relative: 20.3 % (ref 12.0–46.0)
Lymphs Abs: 1.5 10*3/uL (ref 0.7–4.0)
MCHC: 33.3 g/dL (ref 30.0–36.0)
MCV: 91.1 fl (ref 78.0–100.0)
Monocytes Absolute: 0.6 10*3/uL (ref 0.1–1.0)
Monocytes Relative: 8.5 % (ref 3.0–12.0)
Neutro Abs: 5.2 10*3/uL (ref 1.4–7.7)
Neutrophils Relative %: 69.4 % (ref 43.0–77.0)
Platelets: 215 10*3/uL (ref 150.0–400.0)
RBC: 4.62 Mil/uL (ref 3.87–5.11)
RDW: 13.9 % (ref 11.5–15.5)
WBC: 7.5 10*3/uL (ref 4.0–10.5)

## 2015-10-07 LAB — TSH: TSH: 3.81 u[IU]/mL (ref 0.35–4.50)

## 2015-10-07 LAB — HEMOGLOBIN A1C: Hgb A1c MFr Bld: 6.8 % — ABNORMAL HIGH (ref 4.6–6.5)

## 2015-10-07 LAB — LIPID PANEL
Cholesterol: 188 mg/dL (ref 0–200)
HDL: 70.1 mg/dL (ref >39.00)
LDL Cholesterol: 95 mg/dL (ref 0–99)
NonHDL: 117.69
Total CHOL/HDL Ratio: 3
Triglycerides: 115 mg/dL (ref 0.0–149.0)
VLDL: 23 mg/dL (ref 0.0–40.0)

## 2015-10-07 LAB — VITAMIN D 25 HYDROXY (VIT D DEFICIENCY, FRACTURES): VITD: 44.14 ng/mL (ref 30.00–100.00)

## 2015-10-15 ENCOUNTER — Ambulatory Visit
Admission: RE | Admit: 2015-10-15 | Discharge: 2015-10-15 | Disposition: A | Discharge status: Home / Self Care | Payer: Medicare Other | Source: Ambulatory Visit | Attending: Obstetrics and Gynecology | Admitting: Obstetrics and Gynecology

## 2015-10-15 DIAGNOSIS — Z1231 Encounter for screening mammogram for malignant neoplasm of breast: Secondary | ICD-10-CM

## 2015-10-16 ENCOUNTER — Telehealth: Payer: Self-pay | Admitting: Gastroenterology

## 2015-10-16 ENCOUNTER — Ambulatory Visit: Payer: Medicare Other

## 2015-10-16 ENCOUNTER — Encounter (HOSPITAL_BASED_OUTPATIENT_CLINIC_OR_DEPARTMENT_OTHER): Payer: Self-pay

## 2015-10-16 ENCOUNTER — Emergency Department (HOSPITAL_BASED_OUTPATIENT_CLINIC_OR_DEPARTMENT_OTHER)
Admission: EM | Admit: 2015-10-16 | Discharge: 2015-10-17 | Disposition: A | Discharge status: Home / Self Care | Payer: Medicare Other | Attending: Emergency Medicine | Admitting: Emergency Medicine

## 2015-10-16 ENCOUNTER — Emergency Department (HOSPITAL_BASED_OUTPATIENT_CLINIC_OR_DEPARTMENT_OTHER): Payer: Medicare Other

## 2015-10-16 DIAGNOSIS — E119 Type 2 diabetes mellitus without complications: Secondary | ICD-10-CM | POA: Diagnosis not present

## 2015-10-16 DIAGNOSIS — E039 Hypothyroidism, unspecified: Secondary | ICD-10-CM | POA: Diagnosis not present

## 2015-10-16 DIAGNOSIS — Z87891 Personal history of nicotine dependence: Secondary | ICD-10-CM | POA: Diagnosis not present

## 2015-10-16 DIAGNOSIS — K5732 Diverticulitis of large intestine without perforation or abscess without bleeding: Secondary | ICD-10-CM

## 2015-10-16 DIAGNOSIS — R1030 Lower abdominal pain, unspecified: Secondary | ICD-10-CM | POA: Diagnosis not present

## 2015-10-16 DIAGNOSIS — Z7984 Long term (current) use of oral hypoglycemic drugs: Secondary | ICD-10-CM | POA: Diagnosis not present

## 2015-10-16 LAB — CBC WITH DIFFERENTIAL/PLATELET
Basophils Absolute: 0 10*3/uL (ref 0.0–0.1)
Basophils Relative: 0 %
Eosinophils Absolute: 0 10*3/uL (ref 0.0–0.7)
Eosinophils Relative: 0 %
HCT: 36.6 % (ref 36.0–46.0)
Hemoglobin: 12.3 g/dL (ref 12.0–15.0)
Lymphocytes Relative: 10 %
Lymphs Abs: 1.1 10*3/uL (ref 0.7–4.0)
MCH: 30.7 pg (ref 26.0–34.0)
MCHC: 33.6 g/dL (ref 30.0–36.0)
MCV: 91.3 fL (ref 78.0–100.0)
Monocytes Absolute: 1 10*3/uL (ref 0.1–1.0)
Monocytes Relative: 9 %
Neutro Abs: 9.1 10*3/uL — ABNORMAL HIGH (ref 1.7–7.7)
Neutrophils Relative %: 81 %
Platelets: 205 10*3/uL (ref 150–400)
RBC: 4.01 MIL/uL (ref 3.87–5.11)
RDW: 13.3 % (ref 11.5–15.5)
WBC: 11.3 10*3/uL — ABNORMAL HIGH (ref 4.0–10.5)

## 2015-10-16 LAB — COMPREHENSIVE METABOLIC PANEL
ALT: 15 U/L (ref 14–54)
AST: 18 U/L (ref 15–41)
Albumin: 3.9 g/dL (ref 3.5–5.0)
Alkaline Phosphatase: 89 U/L (ref 38–126)
Anion gap: 9 (ref 5–15)
BUN: 22 mg/dL — ABNORMAL HIGH (ref 6–20)
CO2: 24 mmol/L (ref 22–32)
Calcium: 8.5 mg/dL — ABNORMAL LOW (ref 8.9–10.3)
Chloride: 105 mmol/L (ref 101–111)
Creatinine, Ser: 0.71 mg/dL (ref 0.44–1.00)
GFR calc Af Amer: >60 mL/min (ref >60)
GFR calc non Af Amer: >60 mL/min (ref >60)
Glucose, Bld: 147 mg/dL — ABNORMAL HIGH (ref 65–99)
Potassium: 3.6 mmol/L (ref 3.5–5.1)
Sodium: 138 mmol/L (ref 135–145)
Total Bilirubin: 0.5 mg/dL (ref 0.3–1.2)
Total Protein: 6.7 g/dL (ref 6.5–8.1)

## 2015-10-16 LAB — URINALYSIS, ROUTINE W REFLEX MICROSCOPIC
Bilirubin Urine: NEGATIVE
Glucose, UA: NEGATIVE mg/dL
Hgb urine dipstick: NEGATIVE
Ketones, ur: 15 mg/dL — AB
Nitrite: NEGATIVE
Protein, ur: NEGATIVE mg/dL
Specific Gravity, Urine: 1.011 (ref 1.005–1.030)
pH: 5 (ref 5.0–8.0)

## 2015-10-16 LAB — URINE MICROSCOPIC-ADD ON

## 2015-10-16 LAB — LIPASE, BLOOD: Lipase: 19 U/L (ref 11–51)

## 2015-10-16 MED ORDER — SODIUM CHLORIDE 0.9 % IV BOLUS (SEPSIS)
1000.0000 mL | Freq: Once | INTRAVENOUS | Status: AC
  Administered 2015-10-16: 1000 mL via INTRAVENOUS

## 2015-10-16 MED ORDER — ONDANSETRON HCL 4 MG PO TABS: 4.0000 mg | ORAL_TABLET | Freq: Four times a day (QID) | ORAL | 0 refills | Status: DC

## 2015-10-16 MED ORDER — METRONIDAZOLE 500 MG PO TABS: 500.0000 mg | ORAL_TABLET | Freq: Three times a day (TID) | ORAL | 0 refills | Status: DC

## 2015-10-16 MED ORDER — HYDROMORPHONE HCL 1 MG/ML IJ SOLN
1.0000 mg | Freq: Once | INTRAMUSCULAR | Status: AC
  Administered 2015-10-16: 1 mg via INTRAVENOUS
  Filled 2015-10-16: qty 1

## 2015-10-16 MED ORDER — IOPAMIDOL (ISOVUE-300) INJECTION 61%
100.0000 mL | Freq: Once | INTRAVENOUS | Status: AC | PRN
  Administered 2015-10-16: 100 mL via INTRAVENOUS

## 2015-10-16 MED ORDER — ONDANSETRON HCL 4 MG/2ML IJ SOLN
4.0000 mg | Freq: Once | INTRAMUSCULAR | Status: AC
  Administered 2015-10-16: 4 mg via INTRAVENOUS
  Filled 2015-10-16: qty 2

## 2015-10-16 MED ORDER — HYDROCODONE-ACETAMINOPHEN 5-325 MG PO TABS: 1.0000 | ORAL_TABLET | ORAL | 0 refills | Status: DC | PRN

## 2015-10-16 MED ORDER — CIPROFLOXACIN HCL 500 MG PO TABS
500.0000 mg | ORAL_TABLET | Freq: Two times a day (BID) | ORAL | 0 refills | Status: DC
Start: 2015-10-16 — End: 2015-10-30

## 2015-10-16 MED ORDER — PROMETHAZINE HCL 25 MG/ML IJ SOLN
12.5000 mg | Freq: Once | INTRAMUSCULAR | Status: AC
  Administered 2015-10-16: 12.5 mg via INTRAVENOUS
  Filled 2015-10-16: qty 1

## 2015-10-16 MED ORDER — CIPROFLOXACIN IN D5W 400 MG/200ML IV SOLN
400.0000 mg | Freq: Once | INTRAVENOUS | Status: AC
  Administered 2015-10-16: 400 mg via INTRAVENOUS
  Filled 2015-10-16: qty 200

## 2015-10-16 MED ORDER — METRONIDAZOLE IN NACL 5-0.79 MG/ML-% IV SOLN
500.0000 mg | Freq: Once | INTRAVENOUS | Status: AC
  Administered 2015-10-16: 500 mg via INTRAVENOUS
  Filled 2015-10-16: qty 100

## 2015-10-16 NOTE — ED Notes (Signed)
Flagyl infusing pt tolerating well infusion near complete, iv cipro at bedside

## 2015-10-16 NOTE — ED Triage Notes (Signed)
C/o diarrhea 2 weeks ago that resolved-and pain, diarrhea x 2 days-NAD-steady gait

## 2015-10-16 NOTE — ED Provider Notes (Signed)
[place] [facility] Provider Note   CSN: [id number] Arrival date & time: 10/16/15  1757  By signing my name below, I, Evelene Croon, attest that this documentation has been prepared under the direction and in the presence of Virgel Manifold, MD . Electronically Signed: Evelene Croon, Scribe. 10/16/2015. 7:13 PM.  History   Chief Complaint Chief Complaint  Patient presents with  . Abdominal Pain    The history is provided by the patient. No language interpreter was used.    HPI Comments:  [name] [name] is a 70 y.o. female who presents to the Emergency Department complaining of gradually worsening, abdominal pain x 3 days. Pt notes her pain began in her lower abdomen but has now spread into her upper abdomen; right sided pain is greater than the left. Her pain has been constant today. She has taken imodium which seems to worsen her pain. She has a h/o cholecystectomy, hysterectomy,  colon resection secondary to diverticulitis, and colon resection repair. Pt notes her pain today feels similar to pain felt in the past with diverticulitis and pain prior to having her gallbladder removed. She reports associated diarrhea. She denies fever, nausea, vomiting, dysuria, hematuria, and blood in her stool. No alleviating factors noted. No recent sick contacts. Pt tried to get in to see her PCP today but was unable to and was advised to come to the ED.    Past Medical History:  Diagnosis Date  . Abdominal pain    EGD and CTs neg, rx Amitriptiline (non ulcer dyspepsia) EGD 12/11.. s/p ballon dilitation  . Anxiety   . Cervical spondylosis without myelopathy 11/30/2012  . Chronic conjunctivitis   . Diabetes mellitus    no meds, diet controlled  . Diverticulosis of colon   . GERD with stricture    s/p dilatations  . History of Lyme disease   . HOH (hard of hearing)    bilateral hearing aids  . Hyperlipidemia   . Hypothyroidism     Dx 10-9 TSH 5.2 , then TSH normalized, eventually went up  again 2012  . Melanoma ([place])    h/o  . Memory difficulties 06/05/2014  . Osteopenia   . Solar lentigo    face; s/p excision 3/09. See Derm q 6 months  . Trigeminal neuralgia    h/o     Patient Active Problem List   Diagnosis Date Noted  . Pain in right lower leg 05/23/2015  . Ingrown toenail without infection 04/11/2015  . Acute non-recurrent frontal sinusitis 03/13/2015  . Rib pain on right side 10/29/2014  . Memory difficulties 06/05/2014  . Abdominal pain, chronic, epigastric 01/29/2014  . Dysphagia 01/29/2014  . History of esophageal stricture 01/29/2014  . Hoarseness 01/29/2014  . Chronic conjunctivitis 02/07/2013  . Cervical spondylosis without myelopathy 11/30/2012  . Abdominal pain, left upper quadrant 04/13/2012  . Foot pain 01/05/2012  . Diarrhea 10/12/2011  . Annual physical exam 03/18/2011  . Headache 02/02/2011  . Abdominal pain 04/28/2010  . NEURALGIA, TRIGEMINAL-- on Neurontin, Elavil, propranolol 03/13/2009  . Hypothyroidism 11/07/2007  . Type 2 diabetes mellitus with diabetic polyneuropathy ([place]) 02/14/2007  . Anxiety-insomnia-UDS 02/14/2007  . GERD 10/23/2006  . MELANOMA 05/10/2006  . Hyperlipidemia 05/10/2006  . DIVERTICULOSIS, COLON 05/10/2006  . Osteopenia 05/10/2006  . ESOPHAGEAL STRICTURE 02/16/2005    Past Surgical History:  Procedure Laterality Date  . ABDOMINAL HYSTERECTOMY    . ANKLE SURGERY     R andkle tendon surgery  . CERVICAL FUSION    .  CHOLECYSTECTOMY    . COLON RESECTION  1999   d/t diverticulitis  . COLON SURGERY    . melanoma resection Right     Right Arm Surgery, R facial surgery (05-2012)   . PTX s/t chest tube    . SHOULDER SURGERY     right  . WRIST SURGERY     removed melaoma, right    OB History    No data available       Home Medications    Prior to Admission medications   Medication Sig Start Date End Date Taking? Authorizing Provider  ALPRAZolam Duanne Moron) 0.5 MG tablet Take 1 tablet (0.5 mg total) by  mouth at bedtime as needed for sleep. 10/03/15   Midge Minium, MD  amitriptyline (ELAVIL) 25 MG tablet Take 1 tablet (25 mg total) by mouth at bedtime. 09/05/15   Dennie Bible, NP  Cholecalciferol (VITAMIN D3) 2000 UNITS TABS Take 1 tablet by mouth daily.     Historical Provider, MD  gabapentin (NEURONTIN) 300 MG capsule TAKE 1 CAPSULE BY MOUTH EVERY MORNING AND 2 CAPSULES EVERY EVENING 09/05/15   Dennie Bible, NP  hyoscyamine (LEVSIN/SL) 0.125 MG SL tablet Place 1 tablet (0.125 mg total) under the tongue every 6 (six) hours as needed for cramping. 02/19/15   Lori P Hvozdovic, PA-C  levothyroxine (SYNTHROID) 25 MCG tablet Take 25 mcg by mouth daily. Friday, Saturday and Sunday-  28mcg    Historical Provider, MD  Multiple Vitamin (MULTIVITAMIN) tablet Take 1 tablet by mouth daily.      Historical Provider, MD  omeprazole (PRILOSEC) 20 MG capsule Take 1 capsule (20 mg total) by mouth daily. 10/03/15   Midge Minium, MD  propranolol (INDERAL) 10 MG tablet One tablet in the morning and 1 tablets at night 09/05/15   Dennie Bible, NP  rosuvastatin (CRESTOR) 10 MG tablet Take 1 tablet (10 mg total) by mouth daily. 10/03/15   Midge Minium, MD    Family History Family History  Problem Relation Age of Onset  . Prostate cancer Father   . Endometrial cancer Sister     clear cell carcinoma  . Stroke Sister   . Heart attack Paternal Grandfather     GF in his 39s  . Melanoma Maternal Uncle   . Colon cancer Neg Hx   . Breast cancer Neg Hx   . Diabetes Neg Hx   . Stomach cancer Neg Hx   . Rectal cancer Neg Hx     Social History Social History  Substance Use Topics  . Smoking status: Former Smoker    Start date: 01/20/1976  . Smokeless tobacco: Never Used  . Alcohol use 1.2 - 1.8 oz/week    2 - 3 Glasses of wine per week     Allergies   Rocephin [ceftriaxone] and Benadryl [diphenhydramine hcl]   Review of Systems Review of Systems  Constitutional: Negative  for fever.  Gastrointestinal: Positive for abdominal pain and diarrhea. Negative for blood in stool, nausea and vomiting.  Genitourinary: Negative for dysuria and hematuria.  All other systems reviewed and are negative.    Physical Exam Updated Vital Signs BP 132/65   Pulse 99   Temp 99.2 F (37.3 C) (Oral)   Resp 16   Ht 5\' 4"  (1.626 m)   Wt 143 lb (64.9 kg)   SpO2 94%   BMI 24.55 kg/m   Physical Exam  Constitutional: She is oriented to person, place, and time. She appears well-developed  and well-nourished. No distress.  HENT:  Head: Normocephalic and atraumatic.  Eyes: EOM are normal.  Neck: Normal range of motion.  Cardiovascular: Normal rate, regular rhythm and normal heart sounds.   Pulmonary/Chest: Effort normal and breath sounds normal.  Abdominal: Soft. She exhibits no distension. There is tenderness (diffusely). There is no rebound and no guarding.  Musculoskeletal: Normal range of motion.  Neurological: She is alert and oriented to person, place, and time.  Skin: Skin is warm and dry.  Psychiatric: She has a normal mood and affect. Judgment normal.  Nursing note and vitals reviewed.  ED Treatments / Results  DIAGNOSTIC STUDIES:  Oxygen Saturation is 99% on RA, normal by my interpretation.    COORDINATION OF CARE:  6:26 PM Discussed treatment plan with pt at bedside and pt agreed to plan.  Labs (all labs ordered are listed, but only abnormal results are displayed) Labs Reviewed  CBC WITH DIFFERENTIAL/PLATELET - Abnormal; Notable for the following:       Result Value   WBC 11.3 (*)    Neutro Abs 9.1 (*)    All other components within normal limits  COMPREHENSIVE METABOLIC PANEL - Abnormal; Notable for the following:    Glucose, Bld 147 (*)    BUN 22 (*)    Calcium 8.5 (*)    All other components within normal limits  URINALYSIS, ROUTINE W REFLEX MICROSCOPIC (NOT AT [facility]) - Abnormal; Notable for the following:    Ketones, ur 15 (*)    Leukocytes, UA  SMALL (*)    All other components within normal limits  URINE MICROSCOPIC-ADD ON - Abnormal; Notable for the following:    Squamous Epithelial / LPF 0-5 (*)    Bacteria, UA MANY (*)    All other components within normal limits  LIPASE, BLOOD    EKG  EKG Interpretation None       Radiology No results found.   Ct Abdomen Pelvis W Contrast  Result Date: 10/16/2015 CLINICAL DATA:  70 year old female with history of lower abdominal pain for the past 3 days. EXAM: CT ABDOMEN AND PELVIS WITH CONTRAST TECHNIQUE: Multidetector CT imaging of the abdomen and pelvis was performed using the standard protocol following bolus administration of intravenous contrast. CONTRAST:  139mL ISOVUE-300 IOPAMIDOL (ISOVUE-300) INJECTION 61% COMPARISON:  CT the abdomen and pelvis 02/13/2015. FINDINGS: Lower chest: Mild scarring in the lower lungs bilaterally. Hepatobiliary: No cystic or solid hepatic lesions. No intra or extrahepatic biliary ductal dilatation. Status post cholecystectomy. Pancreas: No pancreatic mass. No pancreatic ductal dilatation. No pancreatic or peripancreatic fluid or inflammatory changes. Spleen: Unremarkable. Adrenals/Urinary Tract: Several tiny sub cm low-attenuation lesions in the kidneys bilaterally are too small to definitively characterize, but are statistically likely to represent cysts and parapelvic cysts. No definite suspicious renal lesions are noted. No hydroureteronephrosis. Urinary bladder is normal in appearance. Bilateral adrenal glands are normal in appearance. Stomach/Bowel: Unusual appearance of the stomach which is predominantly in the left side of the abdomen rather than the epigastric region (unchanged compared to prior studies, and presumably a normal anatomical variant, or potentially a postoperative change related to prior mobilization of the colon for partial colectomy). No pathologic dilatation of small bowel or colon. Suture line near the rectosigmoid junction, indicative  of prior partial colectomy. In the region of the hepatic flexure there is some eccentric mural thickening of the colon, predominantly centered around what appears to be an inflamed diverticulum, best appreciated on coronal image 23 of series 5. Surrounding inflammatory changes in  the associated transverse mesocolon. Normal appendix. Vascular/Lymphatic: Aortic atherosclerosis, without evidence of aneurysm or dissection in the abdominal or pelvic vasculature. No lymphadenopathy noted in the abdomen or pelvis. Reproductive: Status post hysterectomy. Ovaries are not confidently identified may be surgically absent or atrophic. Other: No significant volume of ascites.  No pneumoperitoneum. Musculoskeletal: There are no aggressive appearing lytic or blastic lesions noted in the visualized portions of the skeleton. IMPRESSION: 1. Inflammatory changes in the region of the hepatic flexure of the colon which appear to be centered around an inflamed diverticulum. This is favored to represent an early acute diverticulitis. Strictly speaking, underlying neoplasm is not entirely excluded. Correlation with nonemergent colonoscopy should be considered after resolution of the patient's acute symptoms to exclude underlying neoplasm. 2. Normal appendix. 3. Aortic atherosclerosis. 4. Additional incidental findings, as above. Electronically Signed   By: Vinnie Langton M.D.   On: 10/16/2015 21:33   Mm Screening Breast Tomo Bilateral  Result Date: 10/17/2015 CLINICAL DATA:  Screening. EXAM: 2D DIGITAL SCREENING BILATERAL MAMMOGRAM WITH CAD AND ADJUNCT TOMO COMPARISON:  Previous exam(s). ACR Breast Density Category b: There are scattered areas of fibroglandular density. FINDINGS: There are no findings suspicious for malignancy. Images were processed with CAD. IMPRESSION: No mammographic evidence of malignancy. A result letter of this screening mammogram will be mailed directly to the patient. RECOMMENDATION: Screening mammogram in one  year. (Code:SM-B-01Y) BI-RADS CATEGORY  1: Negative. Electronically Signed   By: Claudie Revering M.D.   On: 10/17/2015 17:52    Procedures Procedures (including critical care time)  Medications Ordered in ED Medications  sodium chloride 0.9 % bolus 1,000 mL (0 mLs Intravenous Stopped 10/16/15 2002)  HYDROmorphone (DILAUDID) injection 1 mg (1 mg Intravenous Given 10/16/15 1855)  ondansetron (ZOFRAN) injection 4 mg (4 mg Intravenous Given 10/16/15 1856)  iopamidol (ISOVUE-300) 61 % injection 100 mL (100 mLs Intravenous Contrast Given 10/16/15 2107)  promethazine (PHENERGAN) injection 12.5 mg (12.5 mg Intravenous Given 10/16/15 2216)  metroNIDAZOLE (FLAGYL) IVPB 500 mg (0 mg Intravenous Stopped 10/16/15 2320)  ciprofloxacin (CIPRO) IVPB 400 mg (0 mg Intravenous Stopped 10/17/15 0016)     Initial Impression / Assessment and Plan / ED Course  I have reviewed the triage vital signs and the nursing notes.  Pertinent labs & imaging results that were available during my care of the patient were reviewed by me and considered in my medical decision making (see chart for details).  Clinical Course    70 year old female with abdominal pain. Imaging significant for diverticulitis. No evidence of perforation or abscess. I feel she is appropriate for outpatient treatment. She does have a past history of diverticulitis with previous colon resection. She is very comfortable with the typical treatment. Return precautions were discussed. Outpatient follow-up otherwise.  Final Clinical Impressions(s) / ED Diagnoses   Final diagnoses:  Diverticulitis of large intestine without perforation or abscess without bleeding    New Prescriptions New Prescriptions   No medications on file   I personally preformed the services scribed in my presence. The recorded information has been reviewed is accurate. Virgel Manifold, MD.     Virgel Manifold, MD 10/22/15 [phone]

## 2015-10-16 NOTE — Telephone Encounter (Signed)
Spoke to patient, she had diarrhea episode 2 weeks ago. Started again and tried taking immodium and Levsin but has not helped. Is now describing abdomen as hard and painful. Patient did try to get in to see her PCP but no appts. Available. I directed patient to the ED for evaluation. She states that she will have her husband take her now.

## 2015-10-16 NOTE — ED Notes (Signed)
Attempt to draw blood while starting IV, IV was placed in first attempt successfully but IV would not let me draw blood from site

## 2015-10-17 NOTE — Telephone Encounter (Signed)
Thanks for the note, just seeing this now. It appears she was diagnosed with diverticulitis on CT scan and treated for this. She can follow up in a routine visit for reassessment as needed. Thanks

## 2015-10-30 ENCOUNTER — Ambulatory Visit (INDEPENDENT_AMBULATORY_CARE_PROVIDER_SITE_OTHER): Payer: Medicare Other | Admitting: Gastroenterology

## 2015-10-30 ENCOUNTER — Encounter: Payer: Self-pay | Admitting: Gastroenterology

## 2015-10-30 VITALS — BP 100/60 | HR 82 | Ht 64.0 in | Wt 142.0 lb

## 2015-10-30 DIAGNOSIS — K5732 Diverticulitis of large intestine without perforation or abscess without bleeding: Secondary | ICD-10-CM | POA: Diagnosis not present

## 2015-10-30 NOTE — Progress Notes (Signed)
Agree with assessment and plan as outlined.  

## 2015-10-30 NOTE — Patient Instructions (Signed)
Please continue your daily probiotic.   Follow up with [place] as needed.

## 2015-10-30 NOTE — Progress Notes (Signed)
     10/30/2015 [name] [name] [id number] 26-Jan-1945   History of Present Illness:  This is a pleasant 70 year old female who is most recently known to Dr. Havery Moros for colonoscopy in February 2017.  She was found to have 2 polyps that were removed and were non-adenomatous so she has been entered for a 10 year colonoscopy recall. She also had mild scattered diverticulosis. She is here for follow-up after presenting to the ER on September 27 with complaints of right upper quadrant abdominal pain. She was found to have diverticulitis at the hepatic flexure. She was placed on Cipro and Flagyl, which she completed. She's had complete resolution of her symptoms, but still has some mild loose stools, which she contributes to the antibiotics. She is taking a daily probiotic.  Current Medications, Allergies, Past Medical History, Past Surgical History, Family History and Social History were reviewed in [employer] record.   Physical Exam: BP 100/60 (BP Location: Left Arm, Patient Position: Sitting, Cuff Size: Normal)   Pulse 82   Ht 5\' 4"  (1.626 m)   Wt 142 lb (64.4 kg)   BMI 24.37 kg/m  General: Well developed white female in no acute distress Head: Normocephalic and atraumatic Eyes:  Sclerae anicteric, conjunctiva pink  Ears: Normal auditory acuity Lungs: Clear throughout to auscultation Heart: Regular rate and rhythm Abdomen: Soft, non-distended.  Normal bowel sounds.  Non-tender. Musculoskeletal: Symmetrical with no gross deformities  Extremities: No edema  Neurological: Alert oriented x 4, grossly non-focal Psychological:  Alert and cooperative. Normal mood and affect  Assessment and Recommendations: -Diverticulitis at hepatic flexure:  Resolved after course of cipro and flagyl.  Continue probiotics daily.

## 2015-11-11 DIAGNOSIS — E119 Type 2 diabetes mellitus without complications: Secondary | ICD-10-CM | POA: Diagnosis not present

## 2015-11-11 DIAGNOSIS — H01025 Squamous blepharitis left lower eyelid: Secondary | ICD-10-CM | POA: Diagnosis not present

## 2015-11-11 DIAGNOSIS — H04123 Dry eye syndrome of bilateral lacrimal glands: Secondary | ICD-10-CM | POA: Diagnosis not present

## 2015-11-11 DIAGNOSIS — H01022 Squamous blepharitis right lower eyelid: Secondary | ICD-10-CM | POA: Diagnosis not present

## 2015-11-11 DIAGNOSIS — H01021 Squamous blepharitis right upper eyelid: Secondary | ICD-10-CM | POA: Diagnosis not present

## 2015-11-11 DIAGNOSIS — H01024 Squamous blepharitis left upper eyelid: Secondary | ICD-10-CM | POA: Diagnosis not present

## 2015-11-11 DIAGNOSIS — H2513 Age-related nuclear cataract, bilateral: Secondary | ICD-10-CM | POA: Diagnosis not present

## 2015-11-14 ENCOUNTER — Other Ambulatory Visit: Payer: Self-pay | Admitting: Family Medicine

## 2015-11-14 NOTE — Telephone Encounter (Signed)
Last OV 09/1415 Alprazolam last filled 10/03/15 #90 with 1

## 2015-12-05 DIAGNOSIS — Z1272 Encounter for screening for malignant neoplasm of vagina: Secondary | ICD-10-CM | POA: Diagnosis not present

## 2015-12-05 DIAGNOSIS — Z6824 Body mass index (BMI) 24.0-24.9, adult: Secondary | ICD-10-CM | POA: Diagnosis not present

## 2015-12-05 DIAGNOSIS — Z01419 Encounter for gynecological examination (general) (routine) without abnormal findings: Secondary | ICD-10-CM | POA: Diagnosis not present

## 2015-12-06 DIAGNOSIS — N189 Chronic kidney disease, unspecified: Secondary | ICD-10-CM | POA: Diagnosis not present

## 2015-12-06 DIAGNOSIS — E1142 Type 2 diabetes mellitus with diabetic polyneuropathy: Secondary | ICD-10-CM | POA: Diagnosis not present

## 2015-12-06 DIAGNOSIS — E7800 Pure hypercholesterolemia, unspecified: Secondary | ICD-10-CM | POA: Diagnosis not present

## 2015-12-06 DIAGNOSIS — G609 Hereditary and idiopathic neuropathy, unspecified: Secondary | ICD-10-CM | POA: Diagnosis not present

## 2015-12-16 DIAGNOSIS — H04123 Dry eye syndrome of bilateral lacrimal glands: Secondary | ICD-10-CM | POA: Diagnosis not present

## 2015-12-16 DIAGNOSIS — H04129 Dry eye syndrome of unspecified lacrimal gland: Secondary | ICD-10-CM | POA: Diagnosis not present

## 2015-12-16 DIAGNOSIS — H2513 Age-related nuclear cataract, bilateral: Secondary | ICD-10-CM | POA: Diagnosis not present

## 2015-12-31 ENCOUNTER — Encounter: Payer: Self-pay | Admitting: Gastroenterology

## 2015-12-31 ENCOUNTER — Ambulatory Visit (INDEPENDENT_AMBULATORY_CARE_PROVIDER_SITE_OTHER): Payer: Medicare Other | Admitting: Gastroenterology

## 2015-12-31 VITALS — BP 120/74 | HR 92 | Ht 64.0 in | Wt 141.5 lb

## 2015-12-31 DIAGNOSIS — R194 Change in bowel habit: Secondary | ICD-10-CM | POA: Diagnosis not present

## 2015-12-31 DIAGNOSIS — K5732 Diverticulitis of large intestine without perforation or abscess without bleeding: Secondary | ICD-10-CM

## 2015-12-31 MED ORDER — METRONIDAZOLE 500 MG PO TABS: 500.0000 mg | ORAL_TABLET | Freq: Three times a day (TID) | ORAL | 0 refills | Status: DC

## 2015-12-31 MED ORDER — CIPROFLOXACIN HCL 500 MG PO TABS: 500.0000 mg | ORAL_TABLET | Freq: Two times a day (BID) | ORAL | 0 refills | Status: DC

## 2015-12-31 NOTE — Patient Instructions (Signed)
If you are age 70 or older, your body mass index should be between 23-30. Your Body mass index is 24.29 kg/m. If this is out of the aforementioned range listed, please consider follow up with your Primary Care Provider.  If you are age 34 or younger, your body mass index should be between 19-25. Your Body mass index is 24.29 kg/m. If this is out of the aformentioned range listed, please consider follow up with your Primary Care Provider.   We have sent the following medications to your pharmacy for you to pick up at your convenience:  Flagyl. 500mg . Take three times daily for 2 weeks.  Cipro 500mg .  Take twice daily for 2 weeks.  Please purchase over the counter Citracel. Take daily.  You have been given a Low FodMap diet.  Please call for an appointment if you are not feeling better.  Thank you.

## 2015-12-31 NOTE — Progress Notes (Signed)
HPI :  69 year old female with history of diverticulitis and medical history as outlined below, here for a follow-up visit.  Last colonoscopy in February 2017.  She was found to have 2 polyps that were removed and were non-adenomatous. She also had mild scattered diverticulosis.  She was seen in the emergency room in September with right upper quadrant pain and CT scan at that time showed diverticulitis of the hepatic flexure. She was treated with Cipro and Flagyl and had complete resolution of her symptoms at the time.  She reports she has had some intermittent cramping pains in her abdomen for the past 2 weeks, she is concerned about recurrent diverticulitis, it feels the same as it did when she was initially diagnosed. She has cramping pains in her RUQ and also occurs in the LLQ. She has been having this daily. Pain not waking her at night. No fevers. She has tried taking hyocyamine which has not helped at all. This feels like her prior diverticulitis symptoms. She has had some traces of blood in the stool she thinks due to hemorrhoids   She is also having some urgency with her stools, wearing and has had a few rare episodes of fecal incontinence. She is not sure if certain foods can precipitate some urgency. She has had a change in her thyroid medication slightly recently. She reports variable bowel movements, but stool form has been loose. Every stool is loose. She has urgency with many bowel movements. She has a hard time getting to the bathroom and holding it in. She is not having nocturnal symptoms. Her range of stool frequency is ranging from one BM every 1-2 days. No fevers.    Past Medical History:  Diagnosis Date  . Abdominal pain    EGD and CTs neg, rx Amitriptiline (non ulcer dyspepsia) EGD 12/11.. s/p ballon dilitation  . Anxiety   . Cervical spondylosis without myelopathy 11/30/2012  . Chronic conjunctivitis   . Diabetes mellitus    no meds, diet controlled  . Diverticulosis  of colon   . GERD with stricture    s/p dilatations  . History of Lyme disease   . HOH (hard of hearing)    bilateral hearing aids  . Hyperlipidemia   . Hypothyroidism     Dx 10-9 TSH 5.2 , then TSH normalized, eventually went up again 2012  . Melanoma ([place])    h/o  . Memory difficulties 06/05/2014  . Osteopenia   . Solar lentigo    face; s/p excision 3/09. See Derm q 6 months  . Trigeminal neuralgia    h/o      Past Surgical History:  Procedure Laterality Date  . ABDOMINAL HYSTERECTOMY    . ANKLE SURGERY  07/01/2015   R andkle tendon surgery  . CERVICAL FUSION    . CHOLECYSTECTOMY    . COLON RESECTION  1999   d/t diverticulitis  . COLON SURGERY    . melanoma resection Right     Right Arm Surgery, R facial surgery (05-2012)   . PTX s/t chest tube    . SHOULDER SURGERY     right  . WRIST SURGERY     removed melaoma, right   Family History  Problem Relation Age of Onset  . Prostate cancer Father   . Endometrial cancer Sister     clear cell carcinoma  . Stroke Sister   . Heart attack Paternal Grandfather     GF in his 75s  . Melanoma Maternal Uncle   .  Colon cancer Neg Hx   . Breast cancer Neg Hx   . Diabetes Neg Hx   . Stomach cancer Neg Hx   . Rectal cancer Neg Hx    Social History  Substance Use Topics  . Smoking status: Former Smoker    Start date: 01/20/1976  . Smokeless tobacco: Never Used  . Alcohol use 1.2 - 1.8 oz/week    2 - 3 Glasses of wine per week   Current Outpatient Prescriptions  Medication Sig Dispense Refill  . ALPRAZolam (XANAX) 0.5 MG tablet Take 1 tablet (0.5 mg total) by mouth at bedtime as needed for sleep. 90 tablet 1  . amitriptyline (ELAVIL) 25 MG tablet Take 1 tablet (25 mg total) by mouth at bedtime. 30 tablet 11  . Cholecalciferol (VITAMIN D3) 2000 UNITS TABS Take 1 tablet by mouth daily.     [name] gabapentin (NEURONTIN) 300 MG capsule TAKE 1 CAPSULE BY MOUTH EVERY MORNING AND 2 CAPSULES EVERY EVENING 270 capsule 3  . hyoscyamine  (LEVSIN/SL) 0.125 MG SL tablet Place 1 tablet (0.125 mg total) under the tongue every 6 (six) hours as needed for cramping. 100 tablet 1  . levothyroxine (SYNTHROID) 25 MCG tablet Take 50 mcg by mouth daily.     . Multiple Vitamin (MULTIVITAMIN) tablet Take 1 tablet by mouth daily.      [name] omeprazole (PRILOSEC) 20 MG capsule Take 1 capsule (20 mg total) by mouth daily. 90 capsule 1  . Probiotic Product (PROBIOTIC PO) Take 1 tablet by mouth daily.    . propranolol (INDERAL) 10 MG tablet One tablet in the morning and 1 tablets at night 180 tablet 3  . rosuvastatin (CRESTOR) 10 MG tablet Take 1 tablet (10 mg total) by mouth daily. 90 tablet 1   No current facility-administered medications for this visit.    Allergies  Allergen Reactions  . Rocephin [Ceftriaxone] Anaphylaxis  . Benadryl [Diphenhydramine Hcl] Anxiety    Hyperactivity     Review of Systems: All systems reviewed and negative except where noted in HPI.   Lab Results  Component Value Date   WBC 11.3 (H) 10/16/2015   HGB 12.3 10/16/2015   HCT 36.6 10/16/2015   MCV 91.3 10/16/2015   PLT 205 10/16/2015    Lab Results  Component Value Date   CREATININE 0.71 10/16/2015   BUN 22 (H) 10/16/2015   NA 138 10/16/2015   K 3.6 10/16/2015   CL 105 10/16/2015   CO2 24 10/16/2015    Lab Results  Component Value Date   ALT 15 10/16/2015   AST 18 10/16/2015   ALKPHOS 89 10/16/2015   BILITOT 0.5 10/16/2015      Physical Exam: BP 120/74 (BP Location: Left Arm, Patient Position: Sitting, Cuff Size: Normal)   Pulse 92   Ht 5\' 4"  (1.626 m)   Wt 141 lb 8 oz (64.2 kg)   BMI 24.29 kg/m  Constitutional: Pleasant,well-developed, female in no acute distress. HEENT: Normocephalic and atraumatic. Conjunctivae are normal. No scleral icterus. Neck supple.  Cardiovascular: Normal rate, regular rhythm.  Pulmonary/chest: Effort normal and breath sounds normal. No wheezing, rales or rhonchi. Abdominal: Soft, nondistended, mild RUQ  and LUQ TTP without rebound or guarding. There are no masses palpable. No hepatomegaly. Extremities: no edema Lymphadenopathy: No cervical adenopathy noted. Neurological: Alert and oriented to person place and time. Skin: Skin is warm and dry. No rashes noted. Psychiatric: Normal mood and affect. Behavior is normal.   ASSESSMENT AND PLAN: 70 y/o female with  CT evidence of diverticulitis of the hepatic flexure in September s/p treatment which resolved her symptoms at the time, presenting with recurrent abdominal pain and changes in bowel habits as outlined above for the past 2 weeks. Symptoms concerning for perhaps mild recurrent diverticulitis. I discussed options with her, we'll treat her with 2 weeks of empiric antibiotics (Cipro and Flagyl) and see if she gets better. I also recommend a daily fiber supplement in light of her diverticulosis and chronic loose stools to help bulk them. I also counseled her on a low FODMAP diet as she reports specific foods often trigger her loose stools. If symptoms persist despite therapy I asked her to call me back for reassessment. She can otherwise continue hyoscyamine as needed, and continue Florastor.  Alice Cellar, MD [facility] Pager [phone]

## 2016-01-08 DIAGNOSIS — S99921A Unspecified injury of right foot, initial encounter: Secondary | ICD-10-CM | POA: Diagnosis not present

## 2016-01-08 DIAGNOSIS — M79671 Pain in right foot: Secondary | ICD-10-CM | POA: Diagnosis not present

## 2016-01-08 DIAGNOSIS — M25571 Pain in right ankle and joints of right foot: Secondary | ICD-10-CM | POA: Diagnosis not present

## 2016-01-15 ENCOUNTER — Telehealth: Payer: Self-pay | Admitting: Gastroenterology

## 2016-01-15 ENCOUNTER — Telehealth: Payer: Self-pay | Admitting: Neurology

## 2016-01-15 MED ORDER — GABAPENTIN 300 MG PO CAPS: 600.0000 mg | ORAL_CAPSULE | Freq: Two times a day (BID) | ORAL | 6 refills | Status: DC

## 2016-01-15 NOTE — Telephone Encounter (Addendum)
Called pt and instructed her to increase gabapentin to 600 mg twice a day. She was able to read back instructions. Can also try OTC pain reliever such as Tylenol or ibuprofen for breakthrough pain. Verbalized understanding and appreciation for call. Agreed to call back by next week if symptoms do not improve.

## 2016-01-15 NOTE — Telephone Encounter (Signed)
She can increase gabapentin to 600 mg twice daily. Rx called in please call patient

## 2016-01-15 NOTE — Addendum Note (Signed)
Addended by: Evlyn Courier on: 01/15/2016 03:04 PM   Modules accepted: Orders

## 2016-01-15 NOTE — Telephone Encounter (Signed)
Patient advised of Dr. Doyne Keel assessment. She is relieved and is going to call her neurologist as this is similar to what she experienced with prior headache when she saw them. Let her know that will avoid giving her flagyl in the future.

## 2016-01-15 NOTE — Telephone Encounter (Signed)
Unclear if this was due to flagyl or not, it is certainly possible. It could have been a side effect. I don't think it would cause sinus drainage, perhaps she had a URI while taking this. In the future should she need antibiotics we will try to avoid this. Sounds like she finished the flagyl okay otherwise

## 2016-01-15 NOTE — Telephone Encounter (Signed)
Patient had been prescribed Cipro and flagyl for her diverticulitis for 2 weeks. This and the Citrucel has helped her GI symptoms, however, she states that the last time she took this medication (flagyl) she had same side effects only not as bad. She states that on 01/06/16 she developed a head ache, arm pain "feels like tingly needles", develops sinus pressure/clear drainage, joint pain. Denies fever. She is wondering if she needs to contact her PCP or neurologist. She would like a note in her allergy section about the flagyl. Please advise.

## 2016-01-15 NOTE — Telephone Encounter (Signed)
Pt was seen in August by Hoyle Sauer NP to follow-up on cervicogenic HAs and trigeminal neuralgia for which she takes gabapentin 300 mg am and 600 mg pm, propanolol 10 mg BID and amitriptyline 25 mg QHS. Next appt is scheduled Aug 2018.

## 2016-01-15 NOTE — Telephone Encounter (Signed)
Pt is calling in reference to headaches, numbness from head down to cheek right sided starting on 01/06/16.  Pt was prescribed Flagyl and Cipro (both completed).  Pt did contact Dr. Doyne Keel office in reference to these two medications as far as having side effects.  Pt would like a call back please.

## 2016-01-22 DIAGNOSIS — M79671 Pain in right foot: Secondary | ICD-10-CM | POA: Diagnosis not present

## 2016-01-22 DIAGNOSIS — M25571 Pain in right ankle and joints of right foot: Secondary | ICD-10-CM | POA: Diagnosis not present

## 2016-02-10 DIAGNOSIS — E039 Hypothyroidism, unspecified: Secondary | ICD-10-CM | POA: Diagnosis not present

## 2016-02-26 DIAGNOSIS — M79671 Pain in right foot: Secondary | ICD-10-CM | POA: Diagnosis not present

## 2016-02-26 DIAGNOSIS — M25571 Pain in right ankle and joints of right foot: Secondary | ICD-10-CM | POA: Diagnosis not present

## 2016-03-13 DIAGNOSIS — Z85820 Personal history of malignant melanoma of skin: Secondary | ICD-10-CM | POA: Diagnosis not present

## 2016-03-13 DIAGNOSIS — D225 Melanocytic nevi of trunk: Secondary | ICD-10-CM | POA: Diagnosis not present

## 2016-03-13 DIAGNOSIS — D2261 Melanocytic nevi of right upper limb, including shoulder: Secondary | ICD-10-CM | POA: Diagnosis not present

## 2016-03-13 DIAGNOSIS — L821 Other seborrheic keratosis: Secondary | ICD-10-CM | POA: Diagnosis not present

## 2016-03-13 DIAGNOSIS — L438 Other lichen planus: Secondary | ICD-10-CM | POA: Diagnosis not present

## 2016-03-13 DIAGNOSIS — D1801 Hemangioma of skin and subcutaneous tissue: Secondary | ICD-10-CM | POA: Diagnosis not present

## 2016-03-13 DIAGNOSIS — D2271 Melanocytic nevi of right lower limb, including hip: Secondary | ICD-10-CM | POA: Diagnosis not present

## 2016-03-31 ENCOUNTER — Ambulatory Visit: Payer: Medicare Other | Admitting: Family Medicine

## 2016-04-01 DIAGNOSIS — M25571 Pain in right ankle and joints of right foot: Secondary | ICD-10-CM | POA: Diagnosis not present

## 2016-04-01 DIAGNOSIS — M79671 Pain in right foot: Secondary | ICD-10-CM | POA: Diagnosis not present

## 2016-04-07 ENCOUNTER — Ambulatory Visit (INDEPENDENT_AMBULATORY_CARE_PROVIDER_SITE_OTHER): Payer: Medicare Other | Admitting: Family Medicine

## 2016-04-07 ENCOUNTER — Encounter: Payer: Self-pay | Admitting: Family Medicine

## 2016-04-07 VITALS — BP 110/82 | HR 78 | Temp 98.0°F | Resp 16 | Ht 64.0 in | Wt 143.5 lb

## 2016-04-07 DIAGNOSIS — E1142 Type 2 diabetes mellitus with diabetic polyneuropathy: Secondary | ICD-10-CM | POA: Diagnosis not present

## 2016-04-07 DIAGNOSIS — E785 Hyperlipidemia, unspecified: Secondary | ICD-10-CM | POA: Diagnosis not present

## 2016-04-07 LAB — BASIC METABOLIC PANEL
BUN: 24 mg/dL — ABNORMAL HIGH (ref 6–23)
CO2: 32 mEq/L (ref 19–32)
Calcium: 9.9 mg/dL (ref 8.4–10.5)
Chloride: 101 mEq/L (ref 96–112)
Creatinine, Ser: 0.74 mg/dL (ref 0.40–1.20)
GFR: 82.41 mL/min (ref >60.00)
Glucose, Bld: 139 mg/dL — ABNORMAL HIGH (ref 70–99)
Potassium: 4 mEq/L (ref 3.5–5.1)
Sodium: 140 mEq/L (ref 135–145)

## 2016-04-07 LAB — LIPID PANEL
Cholesterol: 199 mg/dL (ref 0–200)
HDL: 72.9 mg/dL (ref >39.00)
LDL Cholesterol: 106 mg/dL — ABNORMAL HIGH (ref 0–99)
NonHDL: 126.21
Total CHOL/HDL Ratio: 3
Triglycerides: 99 mg/dL (ref 0.0–149.0)
VLDL: 19.8 mg/dL (ref 0.0–40.0)

## 2016-04-07 LAB — HEPATIC FUNCTION PANEL
ALT: 14 U/L (ref 0–35)
AST: 17 U/L (ref 0–37)
Albumin: 4.4 g/dL (ref 3.5–5.2)
Alkaline Phosphatase: 88 U/L (ref 39–117)
Bilirubin, Direct: 0.1 mg/dL (ref 0.0–0.3)
Total Bilirubin: 0.4 mg/dL (ref 0.2–1.2)
Total Protein: 6.8 g/dL (ref 6.0–8.3)

## 2016-04-07 LAB — MICROALBUMIN / CREATININE URINE RATIO
Creatinine,U: 49.5 mg/dL
Microalb Creat Ratio: 1.4 mg/g (ref 0.0–30.0)
Microalb, Ur: <0.7 mg/dL (ref 0.0–1.9)

## 2016-04-07 LAB — HEMOGLOBIN A1C: Hgb A1c MFr Bld: 7.1 % — ABNORMAL HIGH (ref 4.6–6.5)

## 2016-04-07 MED ORDER — ALPRAZOLAM 0.5 MG PO TABS: 0.5000 mg | ORAL_TABLET | Freq: Every evening | ORAL | 1 refills | Status: DC | PRN

## 2016-04-07 MED ORDER — OMEPRAZOLE 20 MG PO CPDR: 20.0000 mg | DELAYED_RELEASE_CAPSULE | Freq: Every day | ORAL | 1 refills | Status: DC

## 2016-04-07 MED ORDER — ROSUVASTATIN CALCIUM 10 MG PO TABS: 10.0000 mg | ORAL_TABLET | Freq: Every day | ORAL | 1 refills | Status: DC

## 2016-04-07 NOTE — Assessment & Plan Note (Signed)
Chronic problem.  Tolerating statin w/o difficulty.  Exercising regularly.  Check labs.  Adjust meds prn

## 2016-04-07 NOTE — Progress Notes (Signed)
   Subjective:    Patient ID: [name] [name], female    DOB: 06/16/1945, 71 y.o.   MRN: [id number]  HPI Hyperlipidemia- chronic problem, on Crestor every 6 months.  Denies CP, SOB, HAs, visual changes, edema, abd pain, N/V.  DM- chronic problem, following w/ Dr Chalmers Cater (last seen in October).  Due for A1C, foot exam (due in May).  Due for microalbumin.  Eye exam scheduled end of month.  Pt is attempting to control w/ healthy diet and regular exercise.  Pt reports CBGs are labile- will be good either AM or PM and then 'bad' at the opposite time.  Denies symptomatic lows.  + chronic numbness/tingling of hands/feet.   Review of Systems For ROS see HPI     Objective:   Physical Exam  Constitutional: She is oriented to person, place, and time. She appears well-developed and well-nourished. No distress.  HENT:  Head: Normocephalic and atraumatic.  Eyes: Conjunctivae and EOM are normal. Pupils are equal, round, and reactive to light.  Neck: Normal range of motion. Neck supple. No thyromegaly present.  Cardiovascular: Normal rate, regular rhythm, normal heart sounds and intact distal pulses.   No murmur heard. Pulmonary/Chest: Effort normal and breath sounds normal. No respiratory distress.  Abdominal: Soft. She exhibits no distension. There is no tenderness.  Musculoskeletal: She exhibits no edema.  Lymphadenopathy:    She has no cervical adenopathy.  Neurological: She is alert and oriented to person, place, and time.  Skin: Skin is warm and dry.  Psychiatric: She has a normal mood and affect. Her behavior is normal.  Vitals reviewed.  Diabetic Foot Exam - Simple   Simple Foot Form Diabetic Foot exam was performed with the following findings:  Yes 04/07/2016  9:07 AM  Visual Inspection No deformities, no ulcerations, no other skin breakdown bilaterally:  Yes Sensation Testing See comments:  Yes Pulse Check Posterior Tibialis and Dorsalis pulse intact bilaterally:  Yes Comments Normal  sensation to light touch but decreased to monofilament bilaterally           Assessment & Plan:

## 2016-04-07 NOTE — Assessment & Plan Note (Signed)
Chronic problem.  Currently diet controlled.  Following w/ Dr Chalmers Cater but concerned about the lack of management.  Has eye exam scheduled for later this month.  Foot exam done today.  Will get microalbumin b/c this is not occurring at Endo.  Will follow along and assist as able.

## 2016-04-07 NOTE — Patient Instructions (Signed)
Schedule your Annual Wellness Visit with Maudie Mercury in 6 months and a follow up with me for cholesterol around the same time We'll notify you of your lab results and make any changes if needed Continue to work on healthy diet and regular exercise- you look great! Call with any questions or concerns Happy Spring!!!

## 2016-04-07 NOTE — Progress Notes (Signed)
Pre visit review using our clinic review tool, if applicable. No additional management support is needed unless otherwise documented below in the visit note. 

## 2016-05-12 ENCOUNTER — Telehealth: Payer: Self-pay | Admitting: Neurology

## 2016-05-12 MED ORDER — GABAPENTIN 300 MG PO CAPS: ORAL_CAPSULE | ORAL | 3 refills | Status: DC

## 2016-05-12 NOTE — Telephone Encounter (Signed)
I called patient. The patient had gone up on her Alupent and dosing during a period time when her headaches were more severe, this may have been caused by Flagyl. The patient is off of the medication now, she has cut back on the gabapentin taking 1 in the morning and 2 in the evening. I will call in another prescription for the gabapentin.

## 2016-05-12 NOTE — Telephone Encounter (Signed)
Pt is asking for a new script of her gabapentin (NEURONTIN) 300 MG capsule because to get the Flagel that she was on caused bad heachaes so to get the Flagel out her system for a few weeks she increased her intake of the gabapentin (NEURONTIN) 300 MG capsule and she is now down to just 3.  Pt is asking for a call on the home#

## 2016-05-12 NOTE — Addendum Note (Signed)
Addended by: Kathrynn Ducking on: 05/12/2016 05:09 PM   Modules accepted: Orders

## 2016-05-18 ENCOUNTER — Telehealth: Payer: Self-pay | Admitting: Neurology

## 2016-05-18 DIAGNOSIS — H01022 Squamous blepharitis right lower eyelid: Secondary | ICD-10-CM | POA: Diagnosis not present

## 2016-05-18 DIAGNOSIS — H04123 Dry eye syndrome of bilateral lacrimal glands: Secondary | ICD-10-CM | POA: Diagnosis not present

## 2016-05-18 DIAGNOSIS — H2513 Age-related nuclear cataract, bilateral: Secondary | ICD-10-CM | POA: Diagnosis not present

## 2016-05-18 DIAGNOSIS — H01025 Squamous blepharitis left lower eyelid: Secondary | ICD-10-CM | POA: Diagnosis not present

## 2016-05-18 DIAGNOSIS — H01021 Squamous blepharitis right upper eyelid: Secondary | ICD-10-CM | POA: Diagnosis not present

## 2016-05-18 DIAGNOSIS — H01024 Squamous blepharitis left upper eyelid: Secondary | ICD-10-CM | POA: Diagnosis not present

## 2016-05-18 NOTE — Telephone Encounter (Signed)
Called and left detailed message for pt relaying message below. Advised her to call back if she has further questions or concerns. Gave [facility] phone number.

## 2016-05-18 NOTE — Telephone Encounter (Signed)
Called and spoke with Ivin Booty from patient's pharmacy. She stated rx was placed on hold, most likely d/t being too early for refill. She stated she will get rx ready for pt to pick up today. Should be ready in a couple hours.

## 2016-05-18 NOTE — Telephone Encounter (Signed)
Patient presented in the lobby this morning requesting (per pharmacy) Gabapentin 2 in the morning and 2 in the evening for 10 days. Pharmacy will not fill order Dr.Willis sent until May 5 and she is out of meds Best call back [phone]

## 2016-06-08 DIAGNOSIS — E039 Hypothyroidism, unspecified: Secondary | ICD-10-CM | POA: Diagnosis not present

## 2016-06-08 DIAGNOSIS — G609 Hereditary and idiopathic neuropathy, unspecified: Secondary | ICD-10-CM | POA: Diagnosis not present

## 2016-06-08 DIAGNOSIS — E1142 Type 2 diabetes mellitus with diabetic polyneuropathy: Secondary | ICD-10-CM | POA: Diagnosis not present

## 2016-06-08 DIAGNOSIS — N189 Chronic kidney disease, unspecified: Secondary | ICD-10-CM | POA: Diagnosis not present

## 2016-06-08 LAB — HEMOGLOBIN A1C: Hemoglobin A1C: 7.1

## 2016-06-17 DIAGNOSIS — H04123 Dry eye syndrome of bilateral lacrimal glands: Secondary | ICD-10-CM | POA: Diagnosis not present

## 2016-06-17 DIAGNOSIS — H2513 Age-related nuclear cataract, bilateral: Secondary | ICD-10-CM | POA: Diagnosis not present

## 2016-06-17 DIAGNOSIS — H04129 Dry eye syndrome of unspecified lacrimal gland: Secondary | ICD-10-CM | POA: Diagnosis not present

## 2016-07-01 DIAGNOSIS — M25571 Pain in right ankle and joints of right foot: Secondary | ICD-10-CM | POA: Diagnosis not present

## 2016-08-27 DIAGNOSIS — M7582 Other shoulder lesions, left shoulder: Secondary | ICD-10-CM | POA: Diagnosis not present

## 2016-08-31 DIAGNOSIS — R531 Weakness: Secondary | ICD-10-CM | POA: Diagnosis not present

## 2016-08-31 DIAGNOSIS — M7502 Adhesive capsulitis of left shoulder: Secondary | ICD-10-CM | POA: Diagnosis not present

## 2016-08-31 DIAGNOSIS — M25512 Pain in left shoulder: Secondary | ICD-10-CM | POA: Diagnosis not present

## 2016-08-31 DIAGNOSIS — M25612 Stiffness of left shoulder, not elsewhere classified: Secondary | ICD-10-CM | POA: Diagnosis not present

## 2016-09-01 ENCOUNTER — Encounter (HOSPITAL_BASED_OUTPATIENT_CLINIC_OR_DEPARTMENT_OTHER): Payer: Self-pay | Admitting: *Deleted

## 2016-09-01 ENCOUNTER — Emergency Department (HOSPITAL_BASED_OUTPATIENT_CLINIC_OR_DEPARTMENT_OTHER)
Admission: EM | Admit: 2016-09-01 | Discharge: 2016-09-02 | Disposition: A | Discharge status: Home / Self Care | Payer: Medicare Other | Attending: Emergency Medicine | Admitting: Emergency Medicine

## 2016-09-01 DIAGNOSIS — T387X5A Adverse effect of androgens and anabolic congeners, initial encounter: Secondary | ICD-10-CM | POA: Diagnosis not present

## 2016-09-01 DIAGNOSIS — E1165 Type 2 diabetes mellitus with hyperglycemia: Secondary | ICD-10-CM | POA: Insufficient documentation

## 2016-09-01 DIAGNOSIS — Z79899 Other long term (current) drug therapy: Secondary | ICD-10-CM | POA: Insufficient documentation

## 2016-09-01 DIAGNOSIS — R358 Other polyuria: Secondary | ICD-10-CM | POA: Diagnosis not present

## 2016-09-01 DIAGNOSIS — R202 Paresthesia of skin: Secondary | ICD-10-CM | POA: Diagnosis not present

## 2016-09-01 DIAGNOSIS — T380X5A Adverse effect of glucocorticoids and synthetic analogues, initial encounter: Secondary | ICD-10-CM

## 2016-09-01 DIAGNOSIS — R42 Dizziness and giddiness: Secondary | ICD-10-CM | POA: Insufficient documentation

## 2016-09-01 DIAGNOSIS — Z87891 Personal history of nicotine dependence: Secondary | ICD-10-CM | POA: Diagnosis not present

## 2016-09-01 DIAGNOSIS — R739 Hyperglycemia, unspecified: Secondary | ICD-10-CM

## 2016-09-01 DIAGNOSIS — Z794 Long term (current) use of insulin: Secondary | ICD-10-CM | POA: Diagnosis not present

## 2016-09-01 DIAGNOSIS — E039 Hypothyroidism, unspecified: Secondary | ICD-10-CM | POA: Diagnosis not present

## 2016-09-01 LAB — CBC WITH DIFFERENTIAL/PLATELET
Basophils Absolute: 0 10*3/uL (ref 0.0–0.1)
Basophils Relative: 0 %
Eosinophils Absolute: 0 10*3/uL (ref 0.0–0.7)
Eosinophils Relative: 0 %
HCT: 39.4 % (ref 36.0–46.0)
Hemoglobin: 13.6 g/dL (ref 12.0–15.0)
Lymphocytes Relative: 12 %
Lymphs Abs: 1.2 10*3/uL (ref 0.7–4.0)
MCH: 30.8 pg (ref 26.0–34.0)
MCHC: 34.5 g/dL (ref 30.0–36.0)
MCV: 89.3 fL (ref 78.0–100.0)
Monocytes Absolute: 1.1 10*3/uL — ABNORMAL HIGH (ref 0.1–1.0)
Monocytes Relative: 11 %
Neutro Abs: 8 10*3/uL — ABNORMAL HIGH (ref 1.7–7.7)
Neutrophils Relative %: 77 %
Platelets: 232 10*3/uL (ref 150–400)
RBC: 4.41 MIL/uL (ref 3.87–5.11)
RDW: 12.3 % (ref 11.5–15.5)
WBC: 10.3 10*3/uL (ref 4.0–10.5)

## 2016-09-01 LAB — BASIC METABOLIC PANEL
Anion gap: 8 (ref 5–15)
BUN: 32 mg/dL — ABNORMAL HIGH (ref 6–20)
CO2: 26 mmol/L (ref 22–32)
Calcium: 8.6 mg/dL — ABNORMAL LOW (ref 8.9–10.3)
Chloride: 102 mmol/L (ref 101–111)
Creatinine, Ser: 0.86 mg/dL (ref 0.44–1.00)
GFR calc Af Amer: >60 mL/min (ref >60)
GFR calc non Af Amer: >60 mL/min (ref >60)
Glucose, Bld: 329 mg/dL — ABNORMAL HIGH (ref 65–99)
Potassium: 4.3 mmol/L (ref 3.5–5.1)
Sodium: 136 mmol/L (ref 135–145)

## 2016-09-01 LAB — CBG MONITORING, ED: Glucose-Capillary: 350 mg/dL — ABNORMAL HIGH (ref 65–99)

## 2016-09-01 MED ORDER — SODIUM CHLORIDE 0.9 % IV BOLUS (SEPSIS)
1000.0000 mL | Freq: Once | INTRAVENOUS | Status: AC
  Administered 2016-09-02: 1000 mL via INTRAVENOUS

## 2016-09-01 NOTE — ED Notes (Signed)
Pt lying flat for 10 minutes before orthostatics can be done.

## 2016-09-01 NOTE — ED Triage Notes (Signed)
Pt says she was seen by her doctor for shoulder pain, she was given a steroid pack. Her sugars have been elevated and her endocrinologist ordered glipizide that she started this morning. Tonight, she feel nauseated, dizzy and has a headache. Reports her sugars at home were in the 400's.

## 2016-09-01 NOTE — Discharge Instructions (Addendum)
Please consider stopping the prednisone. Please check your blood sugar frequently tomorrow, if you are going to continue with glyburide. We expect the effects of steroids still to stick around for a day or 2. Call your endocrinologist of primary doctor on Friday if the elevated sugars continue. Please return to the ER if your symptoms worsen; you have increased pain, fevers, chills, inability to keep any medications down, confusion. Otherwise see the outpatient doctor as requested.

## 2016-09-02 DIAGNOSIS — E1165 Type 2 diabetes mellitus with hyperglycemia: Secondary | ICD-10-CM | POA: Diagnosis not present

## 2016-09-02 LAB — CBG MONITORING, ED: Glucose-Capillary: 285 mg/dL — ABNORMAL HIGH (ref 65–99)

## 2016-09-02 NOTE — ED Notes (Signed)
Pt swaying a bit during orthostatic vital signs and stating that she feels "a little bit dizzy" while standing. RN notified.

## 2016-09-02 NOTE — ED Provider Notes (Signed)
[facility] Provider Note   CSN: [id number] Arrival date & time: 09/01/16  2131     History   Chief Complaint Chief Complaint  Patient presents with  . Hyperglycemia  . Dizziness    HPI [name] [name] is a 71 y.o. female.  HPI Pt comes in with cc of elevated blood sugar. Pt has hx of diabetes. She reports that on 5 days ago she was started on steroid taper by her orthopedist. Since then her blood sugars have been elevated, and thus she was started on glyburide by her endocrinologist. Despite the glyburide, today her sugars kept rising, and went above 400, so she decided to come to the ER. Pt denies any n/v/f/c. Pt has no uti like symptoms. Pt has also been unable to sleep well since the steroids were started and today she started getting anxious and had tingling in her hands, which have now resolved.  Past Medical History:  Diagnosis Date  . Abdominal pain    EGD and CTs neg, rx Amitriptiline (non ulcer dyspepsia) EGD 12/11.. s/p ballon dilitation  . Anxiety   . Cervical spondylosis without myelopathy 11/30/2012  . Chronic conjunctivitis   . Diabetes mellitus    no meds, diet controlled  . Diverticulosis of colon   . GERD with stricture    s/p dilatations  . History of Lyme disease   . HOH (hard of hearing)    bilateral hearing aids  . Hyperlipidemia   . Hypothyroidism     Dx 10-9 TSH 5.2 , then TSH normalized, eventually went up again 2012  . Melanoma ([place])    h/o  . Memory difficulties 06/05/2014  . Osteopenia   . Solar lentigo    face; s/p excision 3/09. See Derm q 6 months  . Trigeminal neuralgia    h/o     Patient Active Problem List   Diagnosis Date Noted  . Pain in right lower leg 05/23/2015  . Ingrown toenail without infection 04/11/2015  . Acute non-recurrent frontal sinusitis 03/13/2015  . Rib pain on right side 10/29/2014  . Memory difficulties 06/05/2014  . Abdominal pain, chronic, epigastric 01/29/2014  . Dysphagia 01/29/2014  .  History of esophageal stricture 01/29/2014  . Hoarseness 01/29/2014  . Chronic conjunctivitis 02/07/2013  . Cervical spondylosis without myelopathy 11/30/2012  . Abdominal pain, left upper quadrant 04/13/2012  . Foot pain 01/05/2012  . Diarrhea 10/12/2011  . Annual physical exam 03/18/2011  . Headache 02/02/2011  . Abdominal pain 04/28/2010  . NEURALGIA, TRIGEMINAL-- on Neurontin, Elavil, propranolol 03/13/2009  . Hypothyroidism 11/07/2007  . Type 2 diabetes mellitus with diabetic polyneuropathy ([place]) 02/14/2007  . Anxiety-insomnia-UDS 02/14/2007  . GERD 10/23/2006  . MELANOMA 05/10/2006  . Hyperlipidemia 05/10/2006  . Diverticulitis of colon 05/10/2006  . Osteopenia 05/10/2006  . ESOPHAGEAL STRICTURE 02/16/2005    Past Surgical History:  Procedure Laterality Date  . ABDOMINAL HYSTERECTOMY    . ANKLE SURGERY  07/01/2015   R andkle tendon surgery  . CERVICAL FUSION    . CHOLECYSTECTOMY    . COLON RESECTION  1999   d/t diverticulitis  . COLON SURGERY    . melanoma resection Right     Right Arm Surgery, R facial surgery (05-2012)   . PTX s/t chest tube    . SHOULDER SURGERY     right  . WRIST SURGERY     removed melaoma, right    OB History    No data available  Home Medications    Prior to Admission medications   Medication Sig Start Date End Date Taking? Authorizing Provider  ALPRAZolam Duanne Moron) 0.5 MG tablet Take 1 tablet (0.5 mg total) by mouth at bedtime as needed for sleep. 04/07/16   Midge Minium, MD  amitriptyline (ELAVIL) 25 MG tablet Take 1 tablet (25 mg total) by mouth at bedtime. 09/05/15   Dennie Bible, NP  Cholecalciferol (VITAMIN D3) 2000 UNITS TABS Take 1 tablet by mouth daily.     [provider]  gabapentin (NEURONTIN) 300 MG capsule 1 capsule in The Morning, 2 in the Evening 05/12/16   Kathrynn Ducking, MD  hyoscyamine (LEVSIN/SL) 0.125 MG SL tablet Place 1 tablet (0.125 mg total) under the tongue every 6 (six) hours  as needed for cramping. 02/19/15   Hvozdovic, Cecille Rubin P, PA-C  levothyroxine (SYNTHROID, LEVOTHROID) 50 MCG tablet TK 1 T PO QD 03/02/16   [provider]  Multiple Vitamin (MULTIVITAMIN) tablet Take 1 tablet by mouth daily.      [provider]  omeprazole (PRILOSEC) 20 MG capsule Take 1 capsule (20 mg total) by mouth daily. 04/07/16   Midge Minium, MD  Probiotic Product (PROBIOTIC PO) Take 1 tablet by mouth daily.    [provider]  propranolol (INDERAL) 10 MG tablet One tablet in the morning and 1 tablets at night 09/05/15   Dennie Bible, NP  rosuvastatin (CRESTOR) 10 MG tablet Take 1 tablet (10 mg total) by mouth daily. 04/07/16   Midge Minium, MD    Family History Family History  Problem Relation Age of Onset  . Prostate cancer Father   . Endometrial cancer Sister        clear cell carcinoma  . Stroke Sister   . Heart attack Paternal Grandfather        GF in his 15s  . Melanoma Maternal Uncle   . Colon cancer Neg Hx   . Breast cancer Neg Hx   . Diabetes Neg Hx   . Stomach cancer Neg Hx   . Rectal cancer Neg Hx     Social History Social History  Substance Use Topics  . Smoking status: Former Smoker    Start date: 01/20/1976  . Smokeless tobacco: Never Used  . Alcohol use 1.2 - 1.8 oz/week    2 - 3 Glasses of wine per week     Allergies   Rocephin [ceftriaxone] and Benadryl [diphenhydramine hcl]   Review of Systems Review of Systems  Constitutional: Negative for fever.  Respiratory: Negative for shortness of breath.   Cardiovascular: Negative for chest pain.  Gastrointestinal: Negative for abdominal pain, nausea and vomiting.  Endocrine: Positive for polyuria.  Genitourinary: Negative for dysuria, flank pain and hematuria.  Musculoskeletal: Positive for arthralgias.     Physical Exam Updated Vital Signs BP (!) 143/83   Pulse 67   Temp 98.2 F (36.8 C) (Oral)   Resp 17   Ht 5\' 4"  (1.626 m)   Wt 64.4 kg (142 lb)    SpO2 92%   BMI 24.37 kg/m   Physical Exam  Constitutional: She is oriented to person, place, and time. She appears well-developed.  HENT:  Head: Normocephalic and atraumatic.  Eyes: EOM are normal.  Neck: Normal range of motion. Neck supple.  Cardiovascular: Normal rate.   Pulmonary/Chest: Effort normal.  Abdominal: Bowel sounds are normal.  Musculoskeletal:  L shoulder passive ROM is not showing severe tenderess or any stiffness. Pt able to forward  flex and abduct  Neurological: She is alert and oriented to person, place, and time. No cranial nerve deficit. Coordination normal.  Skin: Skin is warm and dry.  Nursing note and vitals reviewed.    ED Treatments / Results  Labs (all labs ordered are listed, but only abnormal results are displayed) Labs Reviewed  CBC WITH DIFFERENTIAL/PLATELET - Abnormal; Notable for the following:       Result Value   Neutro Abs 8.0 (*)    Monocytes Absolute 1.1 (*)    All other components within normal limits  BASIC METABOLIC PANEL - Abnormal; Notable for the following:    Glucose, Bld 329 (*)    BUN 32 (*)    Calcium 8.6 (*)    All other components within normal limits  CBG MONITORING, ED - Abnormal; Notable for the following:    Glucose-Capillary 350 (*)    All other components within normal limits  CBG MONITORING, ED - Abnormal; Notable for the following:    Glucose-Capillary 285 (*)    All other components within normal limits    EKG  EKG Interpretation  Date/Time:  Tuesday September 01 2016 21:56:43 EDT Ventricular Rate:  82 PR Interval:  130 QRS Duration: 94 QT Interval:  372 QTC Calculation: 434 R Axis:   10 Text Interpretation:  Normal sinus rhythm Normal ECG Confirmed by Veryl Speak [phone]) on 09/01/2016 11:55:58 PM       Radiology No results found.  Procedures Procedures (including critical care time)  Medications Ordered in ED Medications  sodium chloride 0.9 % bolus 1,000 mL (0 mLs Intravenous Stopped 09/02/16  0114)     Initial Impression / Assessment and Plan / ED Course  I have reviewed the triage vital signs and the nursing notes.  Pertinent labs & imaging results that were available during my care of the patient were reviewed by me and considered in my medical decision making (see chart for details).  Clinical Course as of Sep 03 206  Wed Sep 02, 2016  0207 Improved sugars with hydration. Pt will likely stop steroids now. I dont think the benefit outweighs the risk at this time. Strict ER return precautions have been discussed, and patient is agreeing with the plan and is comfortable with the workup done and the recommendations from the ER.   Glucose-Capillary: (!) 285 [AN]    Clinical Course User Index [AN] Varney Biles, MD   Pt comes in with cc of elevated blood sugar. The hyperglycemia is likely due to prednisone. History is not suggestive of infection or any other stressors. Pt's shoulder exam reveals no signs of septic arthritis. Pt is not in DKA. We will hydrate, as she had some dizziness when getting up and her BUN is high and pt has polydipsia.    Final Clinical Impressions(s) / ED Diagnoses   Final diagnoses:  Hyperglycemia  Steroid side effects, initial encounter    New Prescriptions New Prescriptions   No medications on file     Varney Biles, MD 09/02/16 [phone]

## 2016-09-03 DIAGNOSIS — R531 Weakness: Secondary | ICD-10-CM | POA: Diagnosis not present

## 2016-09-03 DIAGNOSIS — M25512 Pain in left shoulder: Secondary | ICD-10-CM | POA: Diagnosis not present

## 2016-09-03 DIAGNOSIS — M25612 Stiffness of left shoulder, not elsewhere classified: Secondary | ICD-10-CM | POA: Diagnosis not present

## 2016-09-03 DIAGNOSIS — M7502 Adhesive capsulitis of left shoulder: Secondary | ICD-10-CM | POA: Diagnosis not present

## 2016-09-04 ENCOUNTER — Ambulatory Visit: Payer: Medicare Other | Admitting: Nurse Practitioner

## 2016-09-07 DIAGNOSIS — M7502 Adhesive capsulitis of left shoulder: Secondary | ICD-10-CM | POA: Diagnosis not present

## 2016-09-07 DIAGNOSIS — M25512 Pain in left shoulder: Secondary | ICD-10-CM | POA: Diagnosis not present

## 2016-09-07 DIAGNOSIS — M25612 Stiffness of left shoulder, not elsewhere classified: Secondary | ICD-10-CM | POA: Diagnosis not present

## 2016-09-07 DIAGNOSIS — R531 Weakness: Secondary | ICD-10-CM | POA: Diagnosis not present

## 2016-09-08 ENCOUNTER — Other Ambulatory Visit: Payer: Self-pay | Admitting: Nurse Practitioner

## 2016-09-09 DIAGNOSIS — M7502 Adhesive capsulitis of left shoulder: Secondary | ICD-10-CM | POA: Diagnosis not present

## 2016-09-09 DIAGNOSIS — R531 Weakness: Secondary | ICD-10-CM | POA: Diagnosis not present

## 2016-09-09 DIAGNOSIS — M25612 Stiffness of left shoulder, not elsewhere classified: Secondary | ICD-10-CM | POA: Diagnosis not present

## 2016-09-09 DIAGNOSIS — M25512 Pain in left shoulder: Secondary | ICD-10-CM | POA: Diagnosis not present

## 2016-09-11 DIAGNOSIS — L812 Freckles: Secondary | ICD-10-CM | POA: Diagnosis not present

## 2016-09-11 DIAGNOSIS — D2271 Melanocytic nevi of right lower limb, including hip: Secondary | ICD-10-CM | POA: Diagnosis not present

## 2016-09-11 DIAGNOSIS — Z85820 Personal history of malignant melanoma of skin: Secondary | ICD-10-CM | POA: Diagnosis not present

## 2016-09-11 DIAGNOSIS — D485 Neoplasm of uncertain behavior of skin: Secondary | ICD-10-CM | POA: Diagnosis not present

## 2016-09-11 DIAGNOSIS — D1801 Hemangioma of skin and subcutaneous tissue: Secondary | ICD-10-CM | POA: Diagnosis not present

## 2016-09-13 ENCOUNTER — Other Ambulatory Visit: Payer: Self-pay | Admitting: Neurology

## 2016-09-15 DIAGNOSIS — M7502 Adhesive capsulitis of left shoulder: Secondary | ICD-10-CM | POA: Diagnosis not present

## 2016-09-15 DIAGNOSIS — M25612 Stiffness of left shoulder, not elsewhere classified: Secondary | ICD-10-CM | POA: Diagnosis not present

## 2016-09-15 DIAGNOSIS — S46012D Strain of muscle(s) and tendon(s) of the rotator cuff of left shoulder, subsequent encounter: Secondary | ICD-10-CM | POA: Diagnosis not present

## 2016-09-15 DIAGNOSIS — M25512 Pain in left shoulder: Secondary | ICD-10-CM | POA: Diagnosis not present

## 2016-09-17 DIAGNOSIS — R531 Weakness: Secondary | ICD-10-CM | POA: Diagnosis not present

## 2016-09-17 DIAGNOSIS — M25512 Pain in left shoulder: Secondary | ICD-10-CM | POA: Diagnosis not present

## 2016-09-17 DIAGNOSIS — M25612 Stiffness of left shoulder, not elsewhere classified: Secondary | ICD-10-CM | POA: Diagnosis not present

## 2016-09-17 DIAGNOSIS — M7502 Adhesive capsulitis of left shoulder: Secondary | ICD-10-CM | POA: Diagnosis not present

## 2016-09-18 ENCOUNTER — Ambulatory Visit (INDEPENDENT_AMBULATORY_CARE_PROVIDER_SITE_OTHER): Payer: Medicare Other | Admitting: Family Medicine

## 2016-09-18 ENCOUNTER — Encounter: Payer: Self-pay | Admitting: Family Medicine

## 2016-09-18 VITALS — BP 130/83 | HR 86 | Temp 98.3°F | Resp 16 | Ht 64.0 in | Wt 141.2 lb

## 2016-09-18 DIAGNOSIS — R42 Dizziness and giddiness: Secondary | ICD-10-CM | POA: Diagnosis not present

## 2016-09-18 DIAGNOSIS — H9191 Unspecified hearing loss, right ear: Secondary | ICD-10-CM

## 2016-09-18 DIAGNOSIS — H6983 Other specified disorders of Eustachian tube, bilateral: Secondary | ICD-10-CM | POA: Diagnosis not present

## 2016-09-18 MED ORDER — FLUTICASONE PROPIONATE 50 MCG/ACT NA SUSP: 2.0000 | Freq: Every day | NASAL | 6 refills | Status: DC

## 2016-09-18 MED ORDER — MECLIZINE HCL 25 MG PO TABS: 25.0000 mg | ORAL_TABLET | Freq: Three times a day (TID) | ORAL | 0 refills | Status: DC | PRN

## 2016-09-18 NOTE — Progress Notes (Signed)
   Subjective:    Patient ID: [name] [name], female    DOB: July 26, 1945, 71 y.o.   MRN: [id number]  HPI Ear pressure- pt reports she was unable to hear out of R ear Wednesday night when she removed hearing aides.  When she woke yesterday, she had vertigo w/ nausea.  Hearing has improved somewhat in R ear today.  No longer having dizziness.   Review of Systems For ROS see HPI     Objective:   Physical Exam  Constitutional: She is oriented to person, place, and time. She appears well-developed and well-nourished. No distress.  HENT:  Head: Normocephalic and atraumatic.  Right Ear: Tympanic membrane is retracted.  Left Ear: Tympanic membrane is retracted.  Nose: Mucosal edema and rhinorrhea present. Right sinus exhibits no maxillary sinus tenderness and no frontal sinus tenderness. Left sinus exhibits no maxillary sinus tenderness and no frontal sinus tenderness.  Mouth/Throat: Mucous membranes are normal. Posterior oropharyngeal erythema (w/ PND) present.  Eyes: Pupils are equal, round, and reactive to light. Conjunctivae and EOM are normal.  Neck: Normal range of motion. Neck supple.  Cardiovascular: Normal rate, regular rhythm and normal heart sounds.   Pulmonary/Chest: Effort normal and breath sounds normal. No respiratory distress. She has no wheezes. She has no rales.  Lymphadenopathy:    She has no cervical adenopathy.  Neurological: She is alert and oriented to person, place, and time. No cranial nerve deficit. Coordination normal.  Psychiatric:  Very anxious  Vitals reviewed.         Assessment & Plan:  Vertigo- new.  Suspect this is due to pt's nasal congestion.  sxs are resolving w/o intervention.  Will start Meclizine as needed for dizziness.  Reviewed dx, red flags, and supportive care.  Pt expressed understanding and is in agreement w/ plan.   Hearing loss- R sided.  Suspect it is due to her eustachian tube dysfunction.  Pt already wearing hearing  aides.  Eustachian tube dysfunction- new.  Pt has evidence of allergy congestion and retracted TMs bilaterally.  Start daily nasal steroid and OTC antihistamine.  Reviewed supportive care and red flags that should prompt return.  Pt expressed understanding and is in agreement w/ plan.

## 2016-09-18 NOTE — Progress Notes (Signed)
Pre visit review using our clinic review tool, if applicable. No additional management support is needed unless otherwise documented below in the visit note. 

## 2016-09-18 NOTE — Patient Instructions (Signed)
Follow up as needed or as scheduled Start the Flonase- 2 sprays each nostril daily Add daily Claritin or Zyrtec as needed to decrease allergy congestion Drink plenty of fluids Use the Meclizine as needed for dizziness Call with any questions or concerns Enjoy your vacation!!!

## 2016-09-27 NOTE — Progress Notes (Signed)
[facility]  PATIENT: [name] [name] DOB: 1945-08-17   REASON FOR VISIT: Follow-up for trigeminal neuralgia, cervical spondylosis without myelopathy HISTORY FROM: Patient    HISTORY OF PRESENT ILLNESS: [name] [name] is a 71 year old right-handed white female with a history of cervical spondylosis and cervicogenic headache and  trigeminal neuralgia. The patient  does fairly well with a combination of propranolol and amitriptyline and gabapentin for her headaches and facial pain. The patient takes only 10 mg twice daily of the propranolol, she takes 25 mg at night of the amitriptyline. The patient has tried to cut back on amitriptyline because she has dry eyes , but her symptoms severely worsened, and she cannot tolerate the dose decrease. She is also on gabapentin 300 mg 1 in the morning and 2 every evening The patient returns to this office for further evaluation. She has noted some gait instability at times.No recent falls.Has  Bouts of vertigo that come and go. She returns for reevaluation  REVIEW OF SYSTEMS: Full 14 system review of systems performed and notable only for those listed, all others are neg:  Constitutional: neg  Cardiovascular: neg Ear/Nose/Throat: Hearing loss, ringing in the ears Skin: neg Eyes: neg Respiratory: neg Gastroitestinal: neg  Hematology/Lymphatic: neg  Endocrine: neg Musculoskeletal: Gait difficulty Allergy/Immunology: neg Neurological: Facial pain Psychiatric: Anxiety Sleep : neg   ALLERGIES: Allergies  Allergen Reactions  . Rocephin [Ceftriaxone] Anaphylaxis  . Benadryl [Diphenhydramine Hcl] Anxiety    Hyperactivity    HOME MEDICATIONS: Outpatient Medications Prior to Visit  Medication Sig Dispense Refill  . ALPRAZolam (XANAX) 0.5 MG tablet Take 1 tablet (0.5 mg total) by mouth at bedtime as needed for sleep. 90 tablet 1  . amitriptyline (ELAVIL) 25 MG tablet TAKE 1 TABLET BY MOUTH AT BEDTIME 30 tablet 0  .  Cholecalciferol (VITAMIN D3) 2000 UNITS TABS Take 1 tablet by mouth daily.     [name] gabapentin (NEURONTIN) 300 MG capsule 1 capsule in The Morning, 2 in the Evening 270 capsule 3  . hyoscyamine (LEVSIN/SL) 0.125 MG SL tablet Place 1 tablet (0.125 mg total) under the tongue every 6 (six) hours as needed for cramping. 100 tablet 1  . levothyroxine (SYNTHROID, LEVOTHROID) 50 MCG tablet TK 1 T PO QD  12  . Lifitegrast (XIIDRA) 5 % SOLN Apply to eye.    . Multiple Vitamin (MULTIVITAMIN) tablet Take 1 tablet by mouth daily.      [name] omeprazole (PRILOSEC) 20 MG capsule Take 1 capsule (20 mg total) by mouth daily. 90 capsule 1  . Probiotic Product (PROBIOTIC PO) Take 1 tablet by mouth daily.    . propranolol (INDERAL) 10 MG tablet TAKE 1 TABLET BY MOUTH EVERY MORNING AND 1 TABLET BY MOUTH AT NIGHT 180 tablet 3  . rosuvastatin (CRESTOR) 10 MG tablet Take 1 tablet (10 mg total) by mouth daily. 90 tablet 1  . fluticasone (FLONASE) 50 MCG/ACT nasal spray Place 2 sprays into both nostrils daily. (Patient not taking: Reported on 09/28/2016) 16 g 6  . meclizine (ANTIVERT) 25 MG tablet Take 1 tablet (25 mg total) by mouth 3 (three) times daily as needed for dizziness. (Patient not taking: Reported on 09/28/2016) 30 tablet 0   No facility-administered medications prior to visit.     PAST MEDICAL HISTORY: Past Medical History:  Diagnosis Date  . Abdominal pain    EGD and CTs neg, rx Amitriptiline (non ulcer dyspepsia) EGD 12/11.. s/p ballon dilitation  . Anxiety   . Cervical spondylosis without myelopathy  11/30/2012  . Chronic conjunctivitis   . Diabetes mellitus    no meds, diet controlled  . Diverticulosis of colon   . GERD with stricture    s/p dilatations  . History of Lyme disease   . HOH (hard of hearing)    bilateral hearing aids  . Hyperlipidemia   . Hypothyroidism     Dx 10-9 TSH 5.2 , then TSH normalized, eventually went up again 2012  . Melanoma ([place])    h/o  . Memory difficulties 06/05/2014    . Osteopenia   . Solar lentigo    face; s/p excision 3/09. See Derm q 6 months  . Trigeminal neuralgia    h/o     PAST SURGICAL HISTORY: Past Surgical History:  Procedure Laterality Date  . ABDOMINAL HYSTERECTOMY    . ANKLE SURGERY  07/01/2015   R andkle tendon surgery  . CERVICAL FUSION    . CHOLECYSTECTOMY    . COLON RESECTION  1999   d/t diverticulitis  . COLON SURGERY    . melanoma resection Right     Right Arm Surgery, R facial surgery (05-2012)   . PTX s/t chest tube    . SHOULDER SURGERY     right  . WRIST SURGERY     removed melaoma, right    FAMILY HISTORY: Family History  Problem Relation Age of Onset  . Prostate cancer Father   . Endometrial cancer Sister        clear cell carcinoma  . Stroke Sister   . Heart attack Paternal Grandfather        GF in his 71s  . Melanoma Maternal Uncle   . Colon cancer Neg Hx   . Breast cancer Neg Hx   . Diabetes Neg Hx   . Stomach cancer Neg Hx   . Rectal cancer Neg Hx     SOCIAL HISTORY: Social History   Social History  . Marital status: Married    Spouse name: Percell Miller  . Number of children: 2  . Years of education: college   Occupational History  . retired Retired   Social History Main Topics  . Smoking status: Former Smoker    Start date: 01/20/1976  . Smokeless tobacco: Never Used  . Alcohol use 1.2 - 1.8 oz/week    2 - 3 Glasses of wine per week  . Drug use: No  . Sexual activity: Not on file   Other Topics Concern  . Not on file   Social History Narrative   Patient is married with 2 children.   Patient is right handed.   Patient has college education.   Patient does not drink caffeine.            PHYSICAL EXAM  Vitals:   09/28/16 1323  BP: 123/79  Pulse: 81  Weight: 143 lb 9.6 oz (65.1 kg)   Body mass index is 24.65 kg/m.  Generalized: Well developed, in no acute distress  Head: normocephalic and atraumatic,. Oropharynx benign  Musculoskeletal: She lacks about 20 of lateral  rotation of the cervical spine bilaterally   Neurological examination   Mentation: Alert oriented to time, place, history taking. Attention span and concentration appropriate. Recent and remote memory intact.  Follows all commands speech and language fluent.   Cranial nerve II-XII: Pupils were equal round reactive to light extraocular movements were full, visual field were full on confrontational test. Facial sensation and strength were normal. hearing was intact to finger rubbing bilaterally. Uvula tongue midline.  head turning and shoulder shrug were normal and symmetric.Tongue protrusion into cheek strength was normal. Motor: normal bulk and tone, full strength in the BUE, BLE, fine finger movements normal, Sensory: normal and symmetric to light touch,   Coordination: finger-nose-finger, heel-to-shin bilaterally, no dysmetria Reflexes: Symmetric upper and lower plantar responses were flexor bilaterally. Gait and Station: Rising up from seated position without assistance, normal stance,  moderate stride, good arm swing, smooth turning, , unsteady with tandem   DIAGNOSTIC DATA (LABS, IMAGING, TESTING) - I reviewed patient records, labs, notes, testing and imaging myself where available.  Lab Results  Component Value Date   WBC 10.3 09/01/2016   HGB 13.6 09/01/2016   HCT 39.4 09/01/2016   MCV 89.3 09/01/2016   PLT 232 09/01/2016      Component Value Date/Time   NA 136 09/01/2016 2300   NA 143 03/12/2014   NA 143 03/12/2014   K 4.3 09/01/2016 2300   CL 102 09/01/2016 2300   CO2 26 09/01/2016 2300   GLUCOSE 329 (H) 09/01/2016 2300   GLUCOSE 126 (H) 11/11/2005 0831   BUN 32 (H) 09/01/2016 2300   BUN 27 (A) 03/12/2014   CREATININE 0.86 09/01/2016 2300   CALCIUM 8.6 (L) 09/01/2016 2300   PROT 6.8 04/07/2016 0916   ALBUMIN 4.4 04/07/2016 0916   AST 17 04/07/2016 0916   ALT 14 04/07/2016 0916   ALKPHOS 88 04/07/2016 0916   BILITOT 0.4 04/07/2016 0916   GFRNONAA >60 09/01/2016 2300    GFRAA >60 09/01/2016 2300   Lab Results  Component Value Date   CHOL 199 04/07/2016   HDL 72.90 04/07/2016   LDLCALC 106 (H) 04/07/2016   LDLDIRECT 148.1 06/11/2006   TRIG 99.0 04/07/2016   CHOLHDL 3 04/07/2016   Lab Results  Component Value Date   HGBA1C 7.1 (H) 04/07/2016    Lab Results  Component Value Date   TSH 3.81 10/07/2015      ASSESSMENT AND PLAN  71 y.o. year old female  has a past medical history of ; Cervical spondylosis without myelopathy (11/30/2012); Trigeminal neuralgia. Mild gait instability.  PLAN: Continue gabapentin at current dose does not need refills Continue Inderal at current dose does not need refills Continue Elavil at current dose will refill Follow-up yearly and when necessary Dennie Bible, [facility], [facility], APRN  [facility] [address], [place], [place] [address] [phone]

## 2016-09-28 ENCOUNTER — Ambulatory Visit (INDEPENDENT_AMBULATORY_CARE_PROVIDER_SITE_OTHER): Payer: Medicare Other | Admitting: Nurse Practitioner

## 2016-09-28 ENCOUNTER — Encounter: Payer: Self-pay | Admitting: Nurse Practitioner

## 2016-09-28 ENCOUNTER — Other Ambulatory Visit: Payer: Self-pay | Admitting: Obstetrics and Gynecology

## 2016-09-28 VITALS — BP 123/79 | HR 81 | Wt 143.6 lb

## 2016-09-28 DIAGNOSIS — M47812 Spondylosis without myelopathy or radiculopathy, cervical region: Secondary | ICD-10-CM | POA: Diagnosis not present

## 2016-09-28 DIAGNOSIS — G500 Trigeminal neuralgia: Secondary | ICD-10-CM | POA: Diagnosis not present

## 2016-09-28 DIAGNOSIS — Z1231 Encounter for screening mammogram for malignant neoplasm of breast: Secondary | ICD-10-CM

## 2016-09-28 DIAGNOSIS — M25612 Stiffness of left shoulder, not elsewhere classified: Secondary | ICD-10-CM | POA: Diagnosis not present

## 2016-09-28 DIAGNOSIS — R531 Weakness: Secondary | ICD-10-CM | POA: Diagnosis not present

## 2016-09-28 DIAGNOSIS — M25512 Pain in left shoulder: Secondary | ICD-10-CM | POA: Diagnosis not present

## 2016-09-28 DIAGNOSIS — M7502 Adhesive capsulitis of left shoulder: Secondary | ICD-10-CM | POA: Diagnosis not present

## 2016-09-28 MED ORDER — AMITRIPTYLINE HCL 25 MG PO TABS: 25.0000 mg | ORAL_TABLET | Freq: Every day | ORAL | 3 refills | Status: DC

## 2016-09-28 NOTE — Patient Instructions (Signed)
Continue gabapentin at current dose does not need refills Continue Inderal at current dose does not need refills Continue Elavil at current dose will refill Follow-up yearly and when necessary

## 2016-09-28 NOTE — Progress Notes (Signed)
I have read the note, and I agree with the clinical assessment and plan.  WILLIS,CHARLES KEITH   

## 2016-10-04 DIAGNOSIS — M25521 Pain in right elbow: Secondary | ICD-10-CM | POA: Diagnosis not present

## 2016-10-09 DIAGNOSIS — D485 Neoplasm of uncertain behavior of skin: Secondary | ICD-10-CM | POA: Diagnosis not present

## 2016-10-09 DIAGNOSIS — E1142 Type 2 diabetes mellitus with diabetic polyneuropathy: Secondary | ICD-10-CM | POA: Diagnosis not present

## 2016-10-09 DIAGNOSIS — D2261 Melanocytic nevi of right upper limb, including shoulder: Secondary | ICD-10-CM | POA: Diagnosis not present

## 2016-10-09 DIAGNOSIS — E039 Hypothyroidism, unspecified: Secondary | ICD-10-CM | POA: Diagnosis not present

## 2016-10-09 LAB — MICROALBUMIN, URINE: Microalb, Ur: 5.4

## 2016-10-12 DIAGNOSIS — M25512 Pain in left shoulder: Secondary | ICD-10-CM | POA: Diagnosis not present

## 2016-10-12 DIAGNOSIS — R531 Weakness: Secondary | ICD-10-CM | POA: Diagnosis not present

## 2016-10-12 DIAGNOSIS — M7502 Adhesive capsulitis of left shoulder: Secondary | ICD-10-CM | POA: Diagnosis not present

## 2016-10-12 DIAGNOSIS — M25612 Stiffness of left shoulder, not elsewhere classified: Secondary | ICD-10-CM | POA: Diagnosis not present

## 2016-10-13 ENCOUNTER — Ambulatory Visit: Payer: Medicare Other

## 2016-10-13 NOTE — Progress Notes (Signed)
Subjective:   [name] [name] is a 71 y.o. female who presents for Medicare Annual (Subsequent) preventive examination.  Review of Systems:  No ROS.  Medicare Wellness Visit. Additional risk factors are reflected in the social history.  Cardiac Risk Factors include: advanced age (>23men, >35 women);diabetes mellitus;dyslipidemia;family history of premature cardiovascular disease   Sleep patterns: Sleeps 8 hours.  Home Safety/Smoke Alarms: Feels safe in home. Smoke alarms in place.  Living environment; residence and Firearm Safety: Lives with husband in 1 story home.  Seat Belt Safety/Bike Helmet: Wears seat belt.   Female:   Pap-N/A      Mammo-10/15/15, negative. Scheduled for 10/16/16.      Dexa scan-06/20/2011, Osteopenia. Scheduled for 12/22/2016. Dr. Helane Rima     CCS-Colonoscopy 03/01/2015, polyp. Recall 10 years.      Objective:     Vitals: BP 130/78 (BP Location: Left Arm, Patient Position: Sitting, Cuff Size: Normal)   Pulse 82   Temp 98.3 F (36.8 C) (Temporal)   Resp 18   Ht 5\' 4"  (1.626 m)   Wt 145 lb 3.2 oz (65.9 kg)   SpO2 97%   BMI 24.92 kg/m   Body mass index is 24.92 kg/m.   Tobacco History  Smoking Status  . Former Smoker  . Start date: 01/20/1976  Smokeless Tobacco  . Never Used     Counseling given: Not Answered   Past Medical History:  Diagnosis Date  . Abdominal pain    EGD and CTs neg, rx Amitriptiline (non ulcer dyspepsia) EGD 12/11.. s/p ballon dilitation  . Anxiety   . Cervical spondylosis without myelopathy 11/30/2012  . Chronic conjunctivitis   . Diabetes mellitus    no meds, diet controlled  . Diverticulosis of colon   . GERD with stricture    s/p dilatations  . History of Lyme disease   . HOH (hard of hearing)    bilateral hearing aids  . Hyperlipidemia   . Hypothyroidism     Dx 10-9 TSH 5.2 , then TSH normalized, eventually went up again 2012  . Melanoma ([place])    h/o  . Memory difficulties 06/05/2014  . Osteopenia   .  Solar lentigo    face; s/p excision 3/09. See Derm q 6 months  . Trigeminal neuralgia    h/o    Past Surgical History:  Procedure Laterality Date  . ABDOMINAL HYSTERECTOMY    . ANKLE SURGERY  07/01/2015   R andkle tendon surgery  . CERVICAL FUSION    . CHOLECYSTECTOMY    . COLON RESECTION  1999   d/t diverticulitis  . COLON SURGERY    . melanoma resection Right     Right Arm Surgery, R facial surgery (05-2012)   . PTX s/t chest tube    . SHOULDER SURGERY     right  . WRIST SURGERY     removed melaoma, right   Family History  Problem Relation Age of Onset  . Prostate cancer Father   . Endometrial cancer Sister        clear cell carcinoma  . Stroke Sister   . Heart attack Paternal Grandfather        GF in his 52s  . Melanoma Maternal Uncle   . Colon cancer Neg Hx   . Breast cancer Neg Hx   . Diabetes Neg Hx   . Stomach cancer Neg Hx   . Rectal cancer Neg Hx    History  Sexual Activity  . Sexual activity: Not  on file    Outpatient Encounter Prescriptions as of 10/14/2016  Medication Sig  . ALPRAZolam (XANAX) 0.5 MG tablet Take 1 tablet (0.5 mg total) by mouth at bedtime as needed for sleep.  [name] amitriptyline (ELAVIL) 25 MG tablet Take 1 tablet (25 mg total) by mouth at bedtime.  . Cholecalciferol (VITAMIN D3) 2000 UNITS TABS Take 1 tablet by mouth daily.   [name] gabapentin (NEURONTIN) 300 MG capsule 1 capsule in The Morning, 2 in the Evening  . hyoscyamine (LEVSIN/SL) 0.125 MG SL tablet Place 1 tablet (0.125 mg total) under the tongue every 6 (six) hours as needed for cramping.  [name] levothyroxine (SYNTHROID, LEVOTHROID) 50 MCG tablet TK 1 T PO QD  . Lifitegrast (XIIDRA) 5 % SOLN Apply to eye.  . meloxicam (MOBIC) 15 MG tablet Take 15 mg by mouth daily.  . Multiple Vitamin (MULTIVITAMIN) tablet Take 1 tablet by mouth daily.    [name] omeprazole (PRILOSEC) 20 MG capsule Take 1 capsule (20 mg total) by mouth daily.  . Probiotic Product (PROBIOTIC PO) Take 1 tablet by mouth daily.   . propranolol (INDERAL) 10 MG tablet TAKE 1 TABLET BY MOUTH EVERY MORNING AND 1 TABLET BY MOUTH AT NIGHT  . rosuvastatin (CRESTOR) 10 MG tablet Take 1 tablet (10 mg total) by mouth daily.  . fluticasone (FLONASE) 50 MCG/ACT nasal spray Place 2 sprays into both nostrils daily. (Patient not taking: Reported on 09/28/2016)  . meclizine (ANTIVERT) 25 MG tablet Take 1 tablet (25 mg total) by mouth 3 (three) times daily as needed for dizziness. (Patient not taking: Reported on 09/28/2016)  . Zoster Vac Recomb Adjuvanted [facility]) injection Inject 0.5 mLs into the muscle once.   No facility-administered encounter medications on file as of 10/14/2016.     Activities of Daily Living In your present state of health, do you have any difficulty performing the following activities: 10/14/2016  Hearing? N  Vision? N  Difficulty concentrating or making decisions? N  Walking or climbing stairs? N  Dressing or bathing? N  Doing errands, shopping? N  Preparing Food and eating ? N  Using the Toilet? N  In the past six months, have you accidently leaked urine? N  Do you have problems with loss of bowel control? N  Managing your Medications? N  Managing your Finances? N  Housekeeping or managing your Housekeeping? N  Some recent data might be hidden    Patient Care Team: Midge Minium, MD as PCP - General (Family Medicine) Zehr, Laban Emperor, PA-C as Physician Assistant (Gastroenterology) Jacelyn Pi, MD as Consulting Physician (Endocrinology) Kathrynn Ducking, MD as Consulting Physician (Neurology) Georgeann Oppenheim, MD as Consulting Physician (Ophthalmology) Rosemary Holms, DPM as Consulting Physician (Podiatry) Dian Queen, MD as Consulting Physician (Obstetrics and Gynecology) Jarome Matin, MD as Consulting Physician (Dermatology) Armbruster, Carlota Raspberry, MD as Consulting Physician (Gastroenterology)    Assessment:    Physical assessment deferred to PCP.  Exercise Activities and Dietary  recommendations Current Exercise Habits: Structured exercise class, Type of exercise: Other - see comments (aqua; silver sneaker; bike), Time (Minutes): 60, Frequency (Times/Week): 4, Weekly Exercise (Minutes/Week): 240, Exercise limited by: None identified   Diet (meal preparation, eat out, water intake, caffeinated beverages, dairy products, fruits and vegetables): Drinks water.   Breakfast: cereal, almond milk, yogurt Lunch: turkey/cheese sandwich, fruit Dinner: lean protein and vegetables, fruit  Goals      Patient Stated   . patient states (pt-stated)          Maintain  blood sugars.       Fall Risk Fall Risk  10/14/2016 09/28/2016 10/03/2015 05/23/2015 04/11/2015  Falls in the past year? Yes No No No No  Number falls in past yr: 2 or more - - - -  Risk for fall due to : Impaired balance/gait;Medication side effect - - - -   Depression Screen PHQ 2/9 Scores 10/14/2016 10/03/2015 05/23/2015 04/11/2015  PHQ - 2 Score 0 0 0 0  Exception Documentation - - - -     Cognitive Function       Ad8 score reviewed for issues:  Issues making decisions: no  Less interest in hobbies / activities: no  Repeats questions, stories (family complaining): no  Trouble using ordinary gadgets (microwave, computer, phone): no  Forgets the month or year: no  Mismanaging finances: no  Remembering appts: no  Daily problems with thinking and/or memory: no Ad8 score is=0     Immunization History  Administered Date(s) Administered  . Influenza Whole 10/26/2011, 09/19/2012  . Influenza, High Dose Seasonal PF 01/23/2015, 10/14/2016  . Influenza,inj,Quad PF,6+ Mos 01/15/2014, 10/03/2015  . Pneumococcal Conjugate-13 04/06/2014  . Pneumococcal Polysaccharide-23 03/18/2011  . Td 12/21/2003  . Tdap 04/06/2014  . Zoster 11/03/2011   Screening Tests Health Maintenance  Topic Date Due  . OPHTHALMOLOGY EXAM  04/18/2016  . HEMOGLOBIN A1C  10/08/2016  . DEXA SCAN  10/19/2016 (Originally 06/19/2013)   . Hepatitis C Screening  10/19/2016 (Originally 07/24/1945)  . INFLUENZA VACCINE  04/19/2017 (Originally 08/19/2016)  . FOOT EXAM  04/07/2017  . URINE MICROALBUMIN  04/07/2017  . MAMMOGRAM  10/14/2017  . TETANUS/TDAP  04/05/2024  . COLONOSCOPY  02/28/2025  . PNA vac Low Risk Adult  Completed      Plan:     Shingles vaccine at pharmacy  Bring a copy of your living will and/or healthcare power of attorney to your next office visit.  Continue doing brain stimulating activities (puzzles, reading, adult coloring books, staying active) to keep memory sharp.   I have personally reviewed and noted the following in the patient's chart:   . Medical and social history . Use of alcohol, tobacco or illicit drugs  . Current medications and supplements . Functional ability and status . Nutritional status . Physical activity . Advanced directives . List of other physicians . Hospitalizations, surgeries, and ER visits in previous 12 months . Vitals . Screenings to include cognitive, depression, and falls . Referrals and appointments  In addition, I have reviewed and discussed with patient certain preventive protocols, quality metrics, and best practice recommendations. A written personalized care plan for preventive services as well as general preventive health recommendations were provided to patient.     Gerilyn Nestle, RN  10/14/2016  Reviewed documentation provided by RN and agree w/ above.  Annye Asa, MD

## 2016-10-14 ENCOUNTER — Encounter: Payer: Self-pay | Admitting: Family Medicine

## 2016-10-14 ENCOUNTER — Ambulatory Visit (INDEPENDENT_AMBULATORY_CARE_PROVIDER_SITE_OTHER): Payer: Medicare Other | Admitting: Family Medicine

## 2016-10-14 ENCOUNTER — Ambulatory Visit: Payer: Medicare Other

## 2016-10-14 ENCOUNTER — Encounter: Payer: Self-pay | Admitting: General Practice

## 2016-10-14 VITALS — BP 130/78 | HR 82 | Temp 98.3°F | Resp 18 | Ht 64.0 in | Wt 145.2 lb

## 2016-10-14 DIAGNOSIS — E785 Hyperlipidemia, unspecified: Secondary | ICD-10-CM | POA: Diagnosis not present

## 2016-10-14 DIAGNOSIS — Z0000 Encounter for general adult medical examination without abnormal findings: Secondary | ICD-10-CM | POA: Diagnosis not present

## 2016-10-14 DIAGNOSIS — Z23 Encounter for immunization: Secondary | ICD-10-CM | POA: Diagnosis not present

## 2016-10-14 LAB — HEPATIC FUNCTION PANEL
ALT: 11 U/L (ref 0–35)
AST: 14 U/L (ref 0–37)
Albumin: 4.1 g/dL (ref 3.5–5.2)
Alkaline Phosphatase: 80 U/L (ref 39–117)
Bilirubin, Direct: 0 mg/dL (ref 0.0–0.3)
Total Bilirubin: 0.3 mg/dL (ref 0.2–1.2)
Total Protein: 6.4 g/dL (ref 6.0–8.3)

## 2016-10-14 LAB — LIPID PANEL
Cholesterol: 172 mg/dL (ref 0–200)
HDL: 62.1 mg/dL (ref >39.00)
LDL Cholesterol: 83 mg/dL (ref 0–99)
NonHDL: 109.75
Total CHOL/HDL Ratio: 3
Triglycerides: 134 mg/dL (ref 0.0–149.0)
VLDL: 26.8 mg/dL (ref 0.0–40.0)

## 2016-10-14 LAB — CBC WITH DIFFERENTIAL/PLATELET
Basophils Absolute: 0.1 10*3/uL (ref 0.0–0.1)
Basophils Relative: 1 % (ref 0.0–3.0)
Eosinophils Absolute: 0.2 10*3/uL (ref 0.0–0.7)
Eosinophils Relative: 2.2 % (ref 0.0–5.0)
HCT: 39.9 % (ref 36.0–46.0)
Hemoglobin: 13.1 g/dL (ref 12.0–15.0)
Lymphocytes Relative: 16.3 % (ref 12.0–46.0)
Lymphs Abs: 1.3 10*3/uL (ref 0.7–4.0)
MCHC: 32.8 g/dL (ref 30.0–36.0)
MCV: 94.5 fl (ref 78.0–100.0)
Monocytes Absolute: 0.8 10*3/uL (ref 0.1–1.0)
Monocytes Relative: 10.4 % (ref 3.0–12.0)
Neutro Abs: 5.6 10*3/uL (ref 1.4–7.7)
Neutrophils Relative %: 70.1 % (ref 43.0–77.0)
Platelets: 231 10*3/uL (ref 150.0–400.0)
RBC: 4.23 Mil/uL (ref 3.87–5.11)
RDW: 13 % (ref 11.5–15.5)
WBC: 8 10*3/uL (ref 4.0–10.5)

## 2016-10-14 MED ORDER — ZOSTER VAC RECOMB ADJUVANTED 50 MCG/0.5ML IM SUSR: 0.5000 mL | Freq: Once | INTRAMUSCULAR | 1 refills | Status: AC

## 2016-10-14 NOTE — Patient Instructions (Addendum)
Follow up in 6 months to recheck cholesterol We'll notify you of your lab results and make any changes if needed Continue to work on healthy diet and regular exercise- you look great! Call with any questions or concerns Happy Fall!!!  Shingles vaccine at pharmacy  Bring a copy of your living will and/or healthcare power of attorney to your next office visit.  Continue doing brain stimulating activities (puzzles, reading, adult coloring books, staying active) to keep memory sharp.   Health Maintenance, Female Adopting a healthy lifestyle and getting preventive care can go a long way to promote health and wellness. Talk with your health care provider about what schedule of regular examinations is right for you. This is a good chance for you to check in with your provider about disease prevention and staying healthy. In between checkups, there are plenty of things you can do on your own. Experts have done a lot of research about which lifestyle changes and preventive measures are most likely to keep you healthy. Ask your health care provider for more information. Weight and diet Eat a healthy diet  Be sure to include plenty of vegetables, fruits, low-fat dairy products, and lean protein.  Do not eat a lot of foods high in solid fats, added sugars, or salt.  Get regular exercise. This is one of the most important things you can do for your health. ? Most adults should exercise for at least 150 minutes each week. The exercise should increase your heart rate and make you sweat (moderate-intensity exercise). ? Most adults should also do strengthening exercises at least twice a week. This is in addition to the moderate-intensity exercise.  Maintain a healthy weight  Body mass index (BMI) is a measurement that can be used to identify possible weight problems. It estimates body fat based on height and weight. Your health care provider can help determine your BMI and help you achieve or maintain a  healthy weight.  For females 47 years of age and older: ? A BMI below 18.5 is considered underweight. ? A BMI of 18.5 to 24.9 is normal. ? A BMI of 25 to 29.9 is considered overweight. ? A BMI of 30 and above is considered obese.  Watch levels of cholesterol and blood lipids  You should start having your blood tested for lipids and cholesterol at 71 years of age, then have this test every 5 years.  You may need to have your cholesterol levels checked more often if: ? Your lipid or cholesterol levels are high. ? You are older than 71 years of age. ? You are at high risk for heart disease.  Cancer screening Lung Cancer  Lung cancer screening is recommended for adults 83-7 years old who are at high risk for lung cancer because of a history of smoking.  A yearly low-dose CT scan of the lungs is recommended for people who: ? Currently smoke. ? Have quit within the past 15 years. ? Have at least a 30-pack-year history of smoking. A pack year is smoking an average of one pack of cigarettes a day for 1 year.  Yearly screening should continue until it has been 15 years since you quit.  Yearly screening should stop if you develop a health problem that would prevent you from having lung cancer treatment.  Breast Cancer  Practice breast self-awareness. This means understanding how your breasts normally appear and feel.  It also means doing regular breast self-exams. Let your health care provider know about any changes,  changes, no matter how small.  If you are in your 20s or 30s, you should have a clinical breast exam (CBE) by a health care provider every 1-3 years as part of a regular health exam.  If you are 40 or older, have a CBE every year. Also consider having a breast X-ray (mammogram) every year.  If you have a family history of breast cancer, talk to your health care provider about genetic screening.  If you are at high risk for breast cancer, talk to your health care provider about  having an MRI and a mammogram every year.  Breast cancer gene (BRCA) assessment is recommended for women who have family members with BRCA-related cancers. BRCA-related cancers include: ? Breast. ? Ovarian. ? Tubal. ? Peritoneal cancers.  Results of the assessment will determine the need for genetic counseling and BRCA1 and BRCA2 testing.  Cervical Cancer Your health care provider may recommend that you be screened regularly for cancer of the pelvic organs (ovaries, uterus, and vagina). This screening involves a pelvic examination, including checking for microscopic changes to the surface of your cervix (Pap test). You may be encouraged to have this screening done every 3 years, beginning at age 21.  For women ages 30-65, health care providers may recommend pelvic exams and Pap testing every 3 years, or they may recommend the Pap and pelvic exam, combined with testing for human papilloma virus (HPV), every 5 years. Some types of HPV increase your risk of cervical cancer. Testing for HPV may also be done on women of any age with unclear Pap test results.  Other health care providers may not recommend any screening for nonpregnant women who are considered low risk for pelvic cancer and who do not have symptoms. Ask your health care provider if a screening pelvic exam is right for you.  If you have had past treatment for cervical cancer or a condition that could lead to cancer, you need Pap tests and screening for cancer for at least 20 years after your treatment. If Pap tests have been discontinued, your risk factors (such as having a new sexual partner) need to be reassessed to determine if screening should resume. Some women have medical problems that increase the chance of getting cervical cancer. In these cases, your health care provider may recommend more frequent screening and Pap tests.  Colorectal Cancer  This type of cancer can be detected and often prevented.  Routine colorectal cancer  screening usually begins at 71 years of age and continues through 71 years of age.  Your health care provider may recommend screening at an earlier age if you have risk factors for colon cancer.  Your health care provider may also recommend using home test kits to check for hidden blood in the stool.  A small camera at the end of a tube can be used to examine your colon directly (sigmoidoscopy or colonoscopy). This is done to check for the earliest forms of colorectal cancer.  Routine screening usually begins at age 50.  Direct examination of the colon should be repeated every 5-10 years through 71 years of age. However, you may need to be screened more often if early forms of precancerous polyps or small growths are found.  Skin Cancer  Check your skin from head to toe regularly.  Tell your health care provider about any new moles or changes in moles, especially if there is a change in a mole's shape or color.  Also tell your health care provider if   you have a mole that is larger than the size of a pencil eraser.  Always use sunscreen. Apply sunscreen liberally and repeatedly throughout the day.  Protect yourself by wearing long sleeves, pants, a wide-brimmed hat, and sunglasses whenever you are outside.  Heart disease, diabetes, and high blood pressure  High blood pressure causes heart disease and increases the risk of stroke. High blood pressure is more likely to develop in: ? People who have blood pressure in the high end of the normal range (130-139/85-89 mm Hg). ? People who are overweight or obese. ? People who are African American.  If you are 18-39 years of age, have your blood pressure checked every 3-5 years. If you are 40 years of age or older, have your blood pressure checked every year. You should have your blood pressure measured twice-once when you are at a hospital or clinic, and once when you are not at a hospital or clinic. Record the average of the two measurements.  To check your blood pressure when you are not at a hospital or clinic, you can use: ? An automated blood pressure machine at a pharmacy. ? A home blood pressure monitor.  If you are between 55 years and 79 years old, ask your health care provider if you should take aspirin to prevent strokes.  Have regular diabetes screenings. This involves taking a blood sample to check your fasting blood sugar level. ? If you are at a normal weight and have a low risk for diabetes, have this test once every three years after 71 years of age. ? If you are overweight and have a high risk for diabetes, consider being tested at a younger age or more often. Preventing infection Hepatitis B  If you have a higher risk for hepatitis B, you should be screened for this virus. You are considered at high risk for hepatitis B if: ? You were born in a country where hepatitis B is common. Ask your health care provider which countries are considered high risk. ? Your parents were born in a high-risk country, and you have not been immunized against hepatitis B (hepatitis B vaccine). ? You have HIV or AIDS. ? You use needles to inject street drugs. ? You live with someone who has hepatitis B. ? You have had sex with someone who has hepatitis B. ? You get hemodialysis treatment. ? You take certain medicines for conditions, including cancer, organ transplantation, and autoimmune conditions.  Hepatitis C  Blood testing is recommended for: ? Everyone born from 1945 through 1965. ? Anyone with known risk factors for hepatitis C.  Sexually transmitted infections (STIs)  You should be screened for sexually transmitted infections (STIs) including gonorrhea and chlamydia if: ? You are sexually active and are younger than 71 years of age. ? You are older than 71 years of age and your health care provider tells you that you are at risk for this type of infection. ? Your sexual activity has changed since you were last screened  and you are at an increased risk for chlamydia or gonorrhea. Ask your health care provider if you are at risk.  If you do not have HIV, but are at risk, it may be recommended that you take a prescription medicine daily to prevent HIV infection. This is called pre-exposure prophylaxis (PrEP). You are considered at risk if: ? You are sexually active and do not regularly use condoms or know the HIV status of your partner(s). ? You take drugs by   You are sexually active with a partner who has HIV.  Talk with your health care provider about whether you are at high risk of being infected with HIV. If you choose to begin PrEP, you should first be tested for HIV. You should then be tested every 3 months for as long as you are taking PrEP. Pregnancy  If you are premenopausal and you may become pregnant, ask your health care provider about preconception counseling.  If you may become pregnant, take 400 to 800 micrograms (mcg) of folic acid every day.  If you want to prevent pregnancy, talk to your health care provider about birth control (contraception). Osteoporosis and menopause  Osteoporosis is a disease in which the bones lose minerals and strength with aging. This can result in serious bone fractures. Your risk for osteoporosis can be identified using a bone density scan.  If you are 18 years of age or older, or if you are at risk for osteoporosis and fractures, ask your health care provider if you should be screened.  Ask your health care provider whether you should take a calcium or vitamin D supplement to lower your risk for osteoporosis.  Menopause may have certain physical symptoms and risks.  Hormone replacement therapy may reduce some of these symptoms and risks. Talk to your health care provider about whether hormone replacement therapy is right for you. Follow these instructions at home:  Schedule regular health, dental, and eye exams.  Stay current with your  immunizations.  Do not use any tobacco products including cigarettes, chewing tobacco, or electronic cigarettes.  If you are pregnant, do not drink alcohol.  If you are breastfeeding, limit how much and how often you drink alcohol.  Limit alcohol intake to no more than 1 drink per day for nonpregnant women. One drink equals 12 ounces of beer, 5 ounces of wine, or 1 ounces of hard liquor.  Do not use street drugs.  Do not share needles.  Ask your health care provider for help if you need support or information about quitting drugs.  Tell your health care provider if you often feel depressed.  Tell your health care provider if you have ever been abused or do not feel safe at home. This information is not intended to replace advice given to you by your health care provider. Make sure you discuss any questions you have with your health care provider. Document Released: 07/21/2010 Document Revised: 06/13/2015 Document Reviewed: 10/09/2014 Elsevier Interactive Patient Education  [employer].

## 2016-10-14 NOTE — Assessment & Plan Note (Signed)
Chronic problem.  On Crestor 10mg  daily w/o difficulty.  Due for labs.  Stressed need for healthy diet and regular exercise.  Check labs.  Adjust meds prn

## 2016-10-14 NOTE — Progress Notes (Signed)
   Subjective:    Patient ID: [name] [name], female    DOB: 10-04-1945, 71 y.o.   MRN: [id number]  HPI Hyperlipidemia- chronic problem, on Crestor 10mg  daily.  Denies CP, SOB, HAs, abd pain, N/V, myalgias.  Had BMP, A1C, and TSH done on 9/21.   Review of Systems For ROS see HPI     Objective:   Physical Exam  Constitutional: She is oriented to person, place, and time. She appears well-developed and well-nourished. No distress.  HENT:  Head: Normocephalic and atraumatic.  Eyes: Pupils are equal, round, and reactive to light. Conjunctivae and EOM are normal.  Neck: Normal range of motion. Neck supple. No thyromegaly present.  Cardiovascular: Normal rate, regular rhythm, normal heart sounds and intact distal pulses.   No murmur heard. Pulmonary/Chest: Effort normal and breath sounds normal. No respiratory distress.  Abdominal: Soft. She exhibits no distension. There is no tenderness.  Musculoskeletal: She exhibits no edema.  Lymphadenopathy:    She has no cervical adenopathy.  Neurological: She is alert and oriented to person, place, and time.  Skin: Skin is warm and dry.  Psychiatric: She has a normal mood and affect. Her behavior is normal.  Vitals reviewed.         Assessment & Plan:

## 2016-10-16 ENCOUNTER — Ambulatory Visit: Payer: Medicare Other

## 2016-10-16 DIAGNOSIS — E039 Hypothyroidism, unspecified: Secondary | ICD-10-CM | POA: Diagnosis not present

## 2016-10-16 DIAGNOSIS — M25612 Stiffness of left shoulder, not elsewhere classified: Secondary | ICD-10-CM | POA: Diagnosis not present

## 2016-10-16 DIAGNOSIS — M7502 Adhesive capsulitis of left shoulder: Secondary | ICD-10-CM | POA: Diagnosis not present

## 2016-10-16 DIAGNOSIS — R531 Weakness: Secondary | ICD-10-CM | POA: Diagnosis not present

## 2016-10-16 DIAGNOSIS — G609 Hereditary and idiopathic neuropathy, unspecified: Secondary | ICD-10-CM | POA: Diagnosis not present

## 2016-10-16 DIAGNOSIS — M25512 Pain in left shoulder: Secondary | ICD-10-CM | POA: Diagnosis not present

## 2016-10-16 DIAGNOSIS — E1142 Type 2 diabetes mellitus with diabetic polyneuropathy: Secondary | ICD-10-CM | POA: Diagnosis not present

## 2016-10-16 DIAGNOSIS — E7800 Pure hypercholesterolemia, unspecified: Secondary | ICD-10-CM | POA: Diagnosis not present

## 2016-10-19 DIAGNOSIS — R531 Weakness: Secondary | ICD-10-CM | POA: Diagnosis not present

## 2016-10-19 DIAGNOSIS — M25612 Stiffness of left shoulder, not elsewhere classified: Secondary | ICD-10-CM | POA: Diagnosis not present

## 2016-10-19 DIAGNOSIS — M25512 Pain in left shoulder: Secondary | ICD-10-CM | POA: Diagnosis not present

## 2016-10-19 DIAGNOSIS — M7502 Adhesive capsulitis of left shoulder: Secondary | ICD-10-CM | POA: Diagnosis not present

## 2016-10-21 DIAGNOSIS — M7502 Adhesive capsulitis of left shoulder: Secondary | ICD-10-CM | POA: Diagnosis not present

## 2016-10-21 DIAGNOSIS — R531 Weakness: Secondary | ICD-10-CM | POA: Diagnosis not present

## 2016-10-21 DIAGNOSIS — M25612 Stiffness of left shoulder, not elsewhere classified: Secondary | ICD-10-CM | POA: Diagnosis not present

## 2016-10-21 DIAGNOSIS — M25512 Pain in left shoulder: Secondary | ICD-10-CM | POA: Diagnosis not present

## 2016-10-23 ENCOUNTER — Ambulatory Visit
Admission: RE | Admit: 2016-10-23 | Discharge: 2016-10-23 | Disposition: A | Discharge status: Home / Self Care | Payer: Medicare Other | Source: Ambulatory Visit | Attending: Obstetrics and Gynecology | Admitting: Obstetrics and Gynecology

## 2016-10-23 DIAGNOSIS — Z1231 Encounter for screening mammogram for malignant neoplasm of breast: Secondary | ICD-10-CM

## 2016-10-26 ENCOUNTER — Other Ambulatory Visit: Payer: Self-pay | Admitting: Family Medicine

## 2016-10-26 DIAGNOSIS — M25612 Stiffness of left shoulder, not elsewhere classified: Secondary | ICD-10-CM | POA: Diagnosis not present

## 2016-10-26 DIAGNOSIS — M7502 Adhesive capsulitis of left shoulder: Secondary | ICD-10-CM | POA: Diagnosis not present

## 2016-10-26 DIAGNOSIS — R531 Weakness: Secondary | ICD-10-CM | POA: Diagnosis not present

## 2016-10-26 DIAGNOSIS — M25512 Pain in left shoulder: Secondary | ICD-10-CM | POA: Diagnosis not present

## 2016-10-27 DIAGNOSIS — M25612 Stiffness of left shoulder, not elsewhere classified: Secondary | ICD-10-CM | POA: Diagnosis not present

## 2016-10-27 DIAGNOSIS — M7502 Adhesive capsulitis of left shoulder: Secondary | ICD-10-CM | POA: Diagnosis not present

## 2016-10-27 DIAGNOSIS — R531 Weakness: Secondary | ICD-10-CM | POA: Diagnosis not present

## 2016-10-27 DIAGNOSIS — M25512 Pain in left shoulder: Secondary | ICD-10-CM | POA: Diagnosis not present

## 2016-11-03 DIAGNOSIS — M25512 Pain in left shoulder: Secondary | ICD-10-CM | POA: Diagnosis not present

## 2016-11-04 DIAGNOSIS — M25512 Pain in left shoulder: Secondary | ICD-10-CM | POA: Diagnosis not present

## 2016-11-04 DIAGNOSIS — M7502 Adhesive capsulitis of left shoulder: Secondary | ICD-10-CM | POA: Diagnosis not present

## 2016-11-04 DIAGNOSIS — M25612 Stiffness of left shoulder, not elsewhere classified: Secondary | ICD-10-CM | POA: Diagnosis not present

## 2016-11-04 DIAGNOSIS — R531 Weakness: Secondary | ICD-10-CM | POA: Diagnosis not present

## 2016-11-11 ENCOUNTER — Other Ambulatory Visit: Payer: Self-pay | Admitting: Family Medicine

## 2016-11-11 NOTE — Telephone Encounter (Signed)
Last OV 10/14/16 Alprazolam last filled 04/07/16 #90 with 1

## 2016-11-12 NOTE — Telephone Encounter (Signed)
Medication filled to pharmacy as requested.   

## 2016-11-13 DIAGNOSIS — M7502 Adhesive capsulitis of left shoulder: Secondary | ICD-10-CM | POA: Diagnosis not present

## 2016-11-13 DIAGNOSIS — M25612 Stiffness of left shoulder, not elsewhere classified: Secondary | ICD-10-CM | POA: Diagnosis not present

## 2016-11-13 DIAGNOSIS — M25512 Pain in left shoulder: Secondary | ICD-10-CM | POA: Diagnosis not present

## 2016-11-13 DIAGNOSIS — R531 Weakness: Secondary | ICD-10-CM | POA: Diagnosis not present

## 2016-11-20 DIAGNOSIS — M7502 Adhesive capsulitis of left shoulder: Secondary | ICD-10-CM | POA: Diagnosis not present

## 2016-11-20 DIAGNOSIS — M25612 Stiffness of left shoulder, not elsewhere classified: Secondary | ICD-10-CM | POA: Diagnosis not present

## 2016-11-20 DIAGNOSIS — M25512 Pain in left shoulder: Secondary | ICD-10-CM | POA: Diagnosis not present

## 2016-11-20 DIAGNOSIS — R531 Weakness: Secondary | ICD-10-CM | POA: Diagnosis not present

## 2016-11-22 ENCOUNTER — Other Ambulatory Visit: Payer: Self-pay | Admitting: Family Medicine

## 2016-11-25 DIAGNOSIS — R531 Weakness: Secondary | ICD-10-CM | POA: Diagnosis not present

## 2016-11-25 DIAGNOSIS — M7502 Adhesive capsulitis of left shoulder: Secondary | ICD-10-CM | POA: Diagnosis not present

## 2016-11-25 DIAGNOSIS — M25512 Pain in left shoulder: Secondary | ICD-10-CM | POA: Diagnosis not present

## 2016-11-25 DIAGNOSIS — M25612 Stiffness of left shoulder, not elsewhere classified: Secondary | ICD-10-CM | POA: Diagnosis not present

## 2016-11-30 DIAGNOSIS — M25512 Pain in left shoulder: Secondary | ICD-10-CM | POA: Diagnosis not present

## 2016-11-30 DIAGNOSIS — M7502 Adhesive capsulitis of left shoulder: Secondary | ICD-10-CM | POA: Diagnosis not present

## 2016-11-30 DIAGNOSIS — R531 Weakness: Secondary | ICD-10-CM | POA: Diagnosis not present

## 2016-11-30 DIAGNOSIS — M25612 Stiffness of left shoulder, not elsewhere classified: Secondary | ICD-10-CM | POA: Diagnosis not present

## 2016-12-07 DIAGNOSIS — M25512 Pain in left shoulder: Secondary | ICD-10-CM | POA: Diagnosis not present

## 2016-12-08 DIAGNOSIS — M25512 Pain in left shoulder: Secondary | ICD-10-CM | POA: Diagnosis not present

## 2016-12-08 DIAGNOSIS — M7502 Adhesive capsulitis of left shoulder: Secondary | ICD-10-CM | POA: Diagnosis not present

## 2016-12-08 DIAGNOSIS — R531 Weakness: Secondary | ICD-10-CM | POA: Diagnosis not present

## 2016-12-08 DIAGNOSIS — M25612 Stiffness of left shoulder, not elsewhere classified: Secondary | ICD-10-CM | POA: Diagnosis not present

## 2016-12-10 IMAGING — CT CT ABD-PELV W/ CM
2 of 5 series · 15 of 46 positions shown, 17 images · IV contrast (APPLIED)
Comparison: CT the abdomen and pelvis 02/13/2015.

CLINICAL DATA: 69-year-old female with history of lower abdominal
pain for the past 3 days.

EXAM:
CT ABDOMEN AND PELVIS WITH CONTRAST
TECHNIQUE: Multidetector CT imaging of the abdomen and pelvis was performed
using the standard protocol following bolus administration of
intravenous contrast.
CONTRAST:  100mL [id number] IOPAMIDOL ([id number]) INJECTION 61%

[Series 2: axial st · axial · 0.84mm/px · z∈[-425,-10]mm · 12 of 95 slices shown, 14 images]
[im 6/95  soft-tissue]
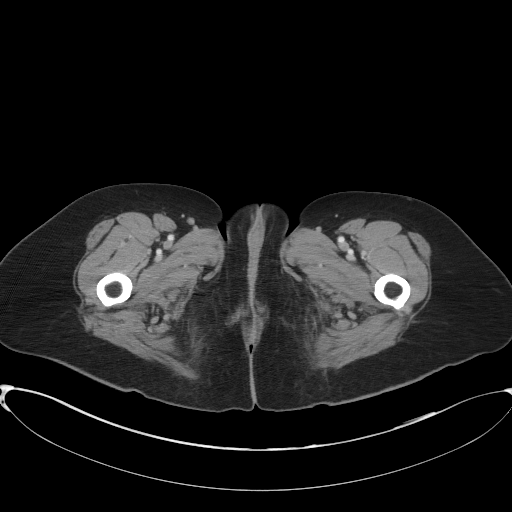
[im 6/95  bone]
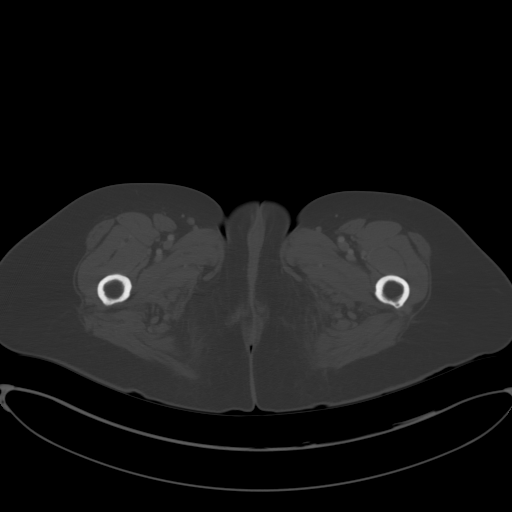
[im 16/95  soft-tissue]
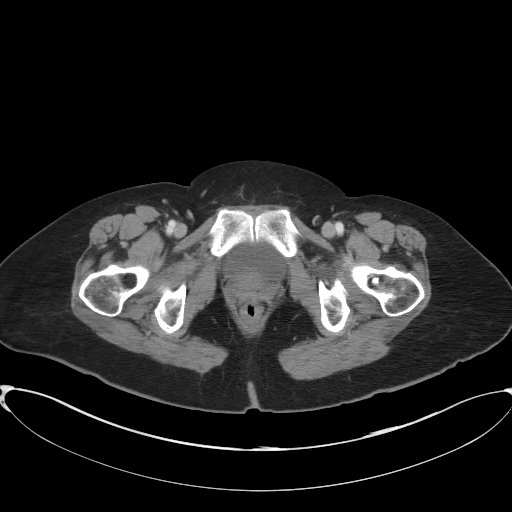
[im 21/95  soft-tissue]
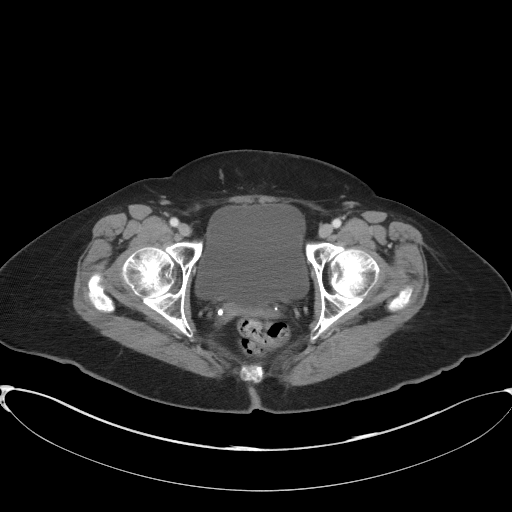
[im 27/95  soft-tissue]
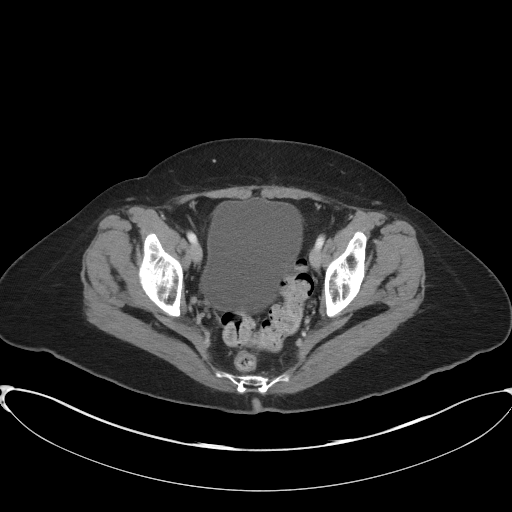
[im 37/95  soft-tissue]
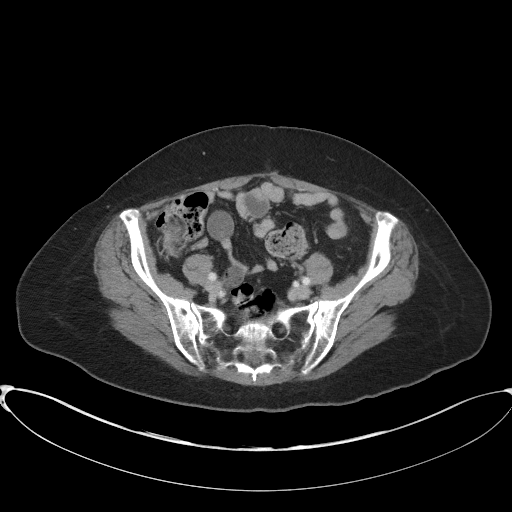
[im 42/95  soft-tissue]
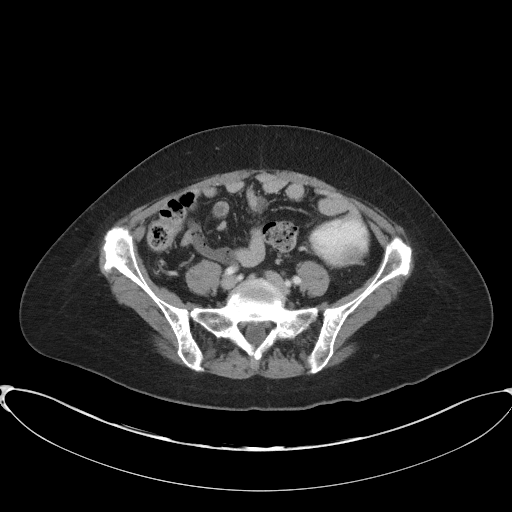
[im 53/95  soft-tissue]
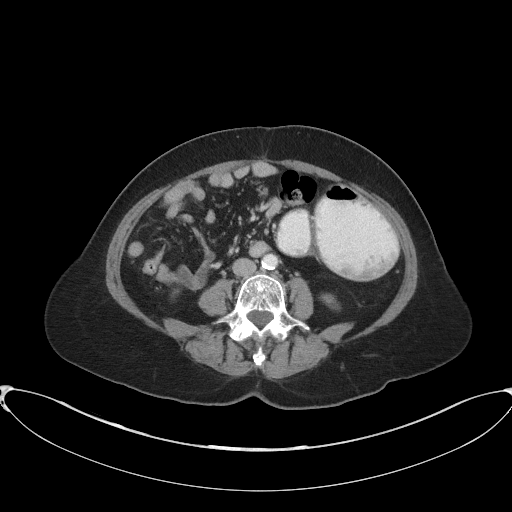
[im 58/95  soft-tissue]
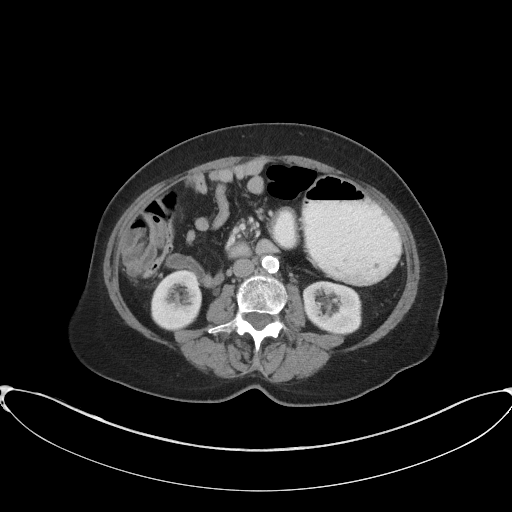
[im 68/95  soft-tissue]
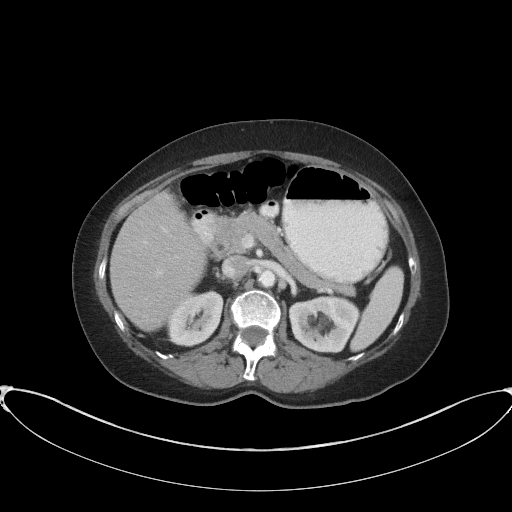
[im 68/95  bone]
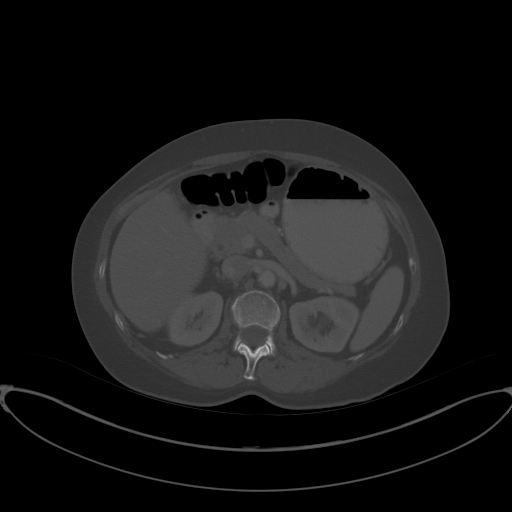
[im 74/95  soft-tissue]
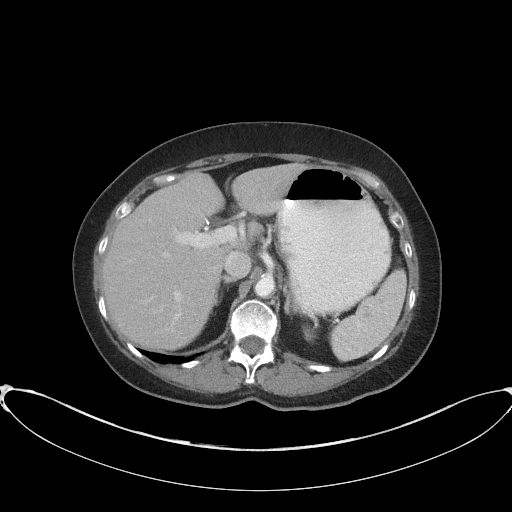
[im 79/95  soft-tissue]
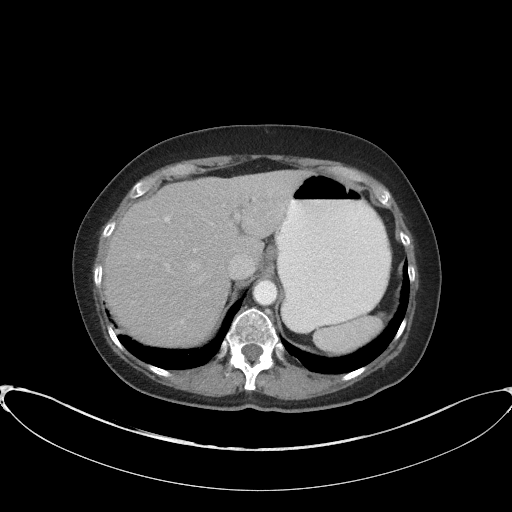
[im 89/95  soft-tissue]
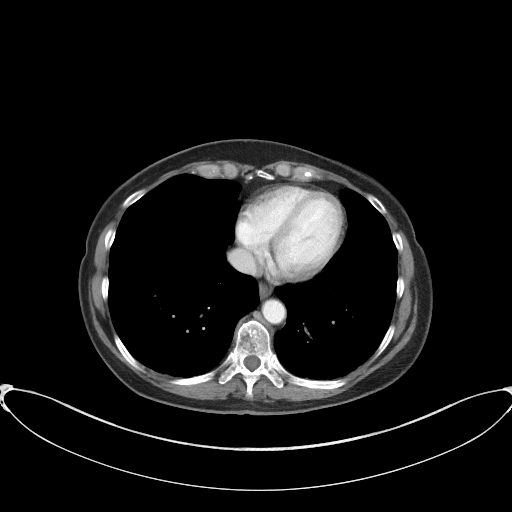

[Series 5: coronal st · coronal · 0.74mm/px · 3 of 77 slices shown]
[im 26/77  soft-tissue]
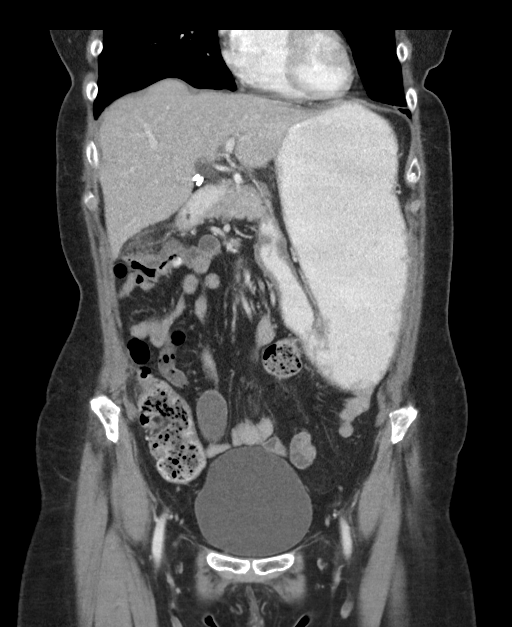
[im 34/77  soft-tissue]
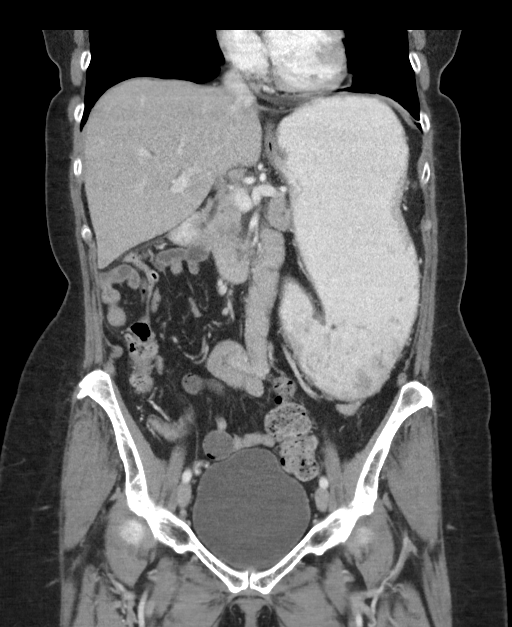
[im 43/77  soft-tissue]
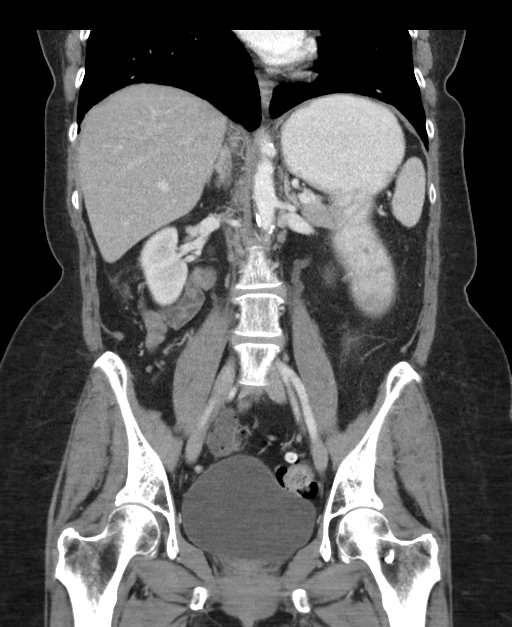

[15 of 46 positions shown; findings below may reference images not displayed]

FINDINGS: Lower chest: Mild scarring in the lower lungs bilaterally.

Hepatobiliary: No cystic or solid hepatic lesions. No intra or
extrahepatic biliary ductal dilatation. Status post cholecystectomy.

Pancreas: No pancreatic mass. No pancreatic ductal dilatation. No
pancreatic or peripancreatic fluid or inflammatory changes.

Spleen: Unremarkable.

Adrenals/Urinary Tract: Several tiny sub cm low-attenuation lesions
in the kidneys bilaterally are too small to definitively
characterize, but are statistically likely to represent cysts and
parapelvic cysts. No definite suspicious renal lesions are noted. No
hydroureteronephrosis. Urinary bladder is normal in appearance.
Bilateral adrenal glands are normal in appearance.

Stomach/Bowel: Unusual appearance of the stomach which is
predominantly in the left side of the abdomen rather than the
epigastric region (unchanged compared to prior studies, and
presumably a normal anatomical variant, or potentially a
postoperative change related to prior mobilization of the colon for
partial colectomy). No pathologic dilatation of small bowel or
colon. Suture line near the rectosigmoid junction, indicative of
prior partial colectomy. In the region of the hepatic flexure there
is some eccentric mural thickening of the colon, predominantly
centered around what appears to be an inflamed diverticulum, best
appreciated on coronal image 23 of series 5. Surrounding
inflammatory changes in the associated transverse mesocolon. Normal
appendix.

Vascular/Lymphatic: Aortic atherosclerosis, without evidence of
aneurysm or dissection in the abdominal or pelvic vasculature. No
lymphadenopathy noted in the abdomen or pelvis.

Reproductive: Status post hysterectomy. Ovaries are not confidently
identified may be surgically absent or atrophic.

Other: No significant volume of ascites.  No pneumoperitoneum.

Musculoskeletal: There are no aggressive appearing lytic or blastic
lesions noted in the visualized portions of the skeleton.
IMPRESSION: 1. Inflammatory changes in the region of the hepatic flexure of the
colon which appear to be centered around an inflamed diverticulum.
This is favored to represent an early acute diverticulitis. Strictly
speaking, underlying neoplasm is not entirely excluded. Correlation
with nonemergent colonoscopy should be considered after resolution
of the patient's acute symptoms to exclude underlying neoplasm.
2. Normal appendix.
3. Aortic atherosclerosis.
4. Additional incidental findings, as above.

## 2016-12-16 ENCOUNTER — Other Ambulatory Visit: Payer: Self-pay

## 2016-12-16 ENCOUNTER — Encounter: Payer: Self-pay | Admitting: Family Medicine

## 2016-12-16 ENCOUNTER — Ambulatory Visit (INDEPENDENT_AMBULATORY_CARE_PROVIDER_SITE_OTHER): Payer: Medicare Other | Admitting: Family Medicine

## 2016-12-16 VITALS — BP 110/73 | HR 68 | Temp 98.3°F | Resp 17 | Ht 64.0 in | Wt 143.2 lb

## 2016-12-16 DIAGNOSIS — J0110 Acute frontal sinusitis, unspecified: Secondary | ICD-10-CM

## 2016-12-16 MED ORDER — DOXYCYCLINE HYCLATE 100 MG PO TABS: 100.0000 mg | ORAL_TABLET | Freq: Two times a day (BID) | ORAL | 0 refills | Status: DC

## 2016-12-16 NOTE — Patient Instructions (Signed)
Follow up as needed or as scheduled Start the Doxycycline twice daily- take w/ food Drink plenty of fluids Continue the Claritin and Flonase REST! Call with any questions or concerns Hang in there! Happy Holidays!!!

## 2016-12-16 NOTE — Progress Notes (Signed)
   Subjective:    Patient ID: [name] [name], female    DOB: 01-27-1945, 71 y.o.   MRN: [id number]  HPI URI- sxs started 34 days ago w/ a headache.  Started Claritin 'right away'.  Add Flonase.  + nasal congestion.  + hoarseness.  Cough is starting and she now has chest tightness.  + sinus pain/pressure- worse when leaning forward.  L ear pain.  + sick contacts.  No N/V.   Review of Systems For ROS see HPI     Objective:   Physical Exam  Constitutional: She is oriented to person, place, and time. She appears well-developed and well-nourished. No distress.  HENT:  Head: Normocephalic and atraumatic.  Right Ear: Tympanic membrane normal.  Left Ear: Tympanic membrane normal.  Nose: Mucosal edema and rhinorrhea present. Right sinus exhibits maxillary sinus tenderness and frontal sinus tenderness. Left sinus exhibits maxillary sinus tenderness and frontal sinus tenderness.  Mouth/Throat: Uvula is midline and mucous membranes are normal. Posterior oropharyngeal erythema present. No oropharyngeal exudate.  Eyes: Conjunctivae and EOM are normal. Pupils are equal, round, and reactive to light.  Neck: Normal range of motion. Neck supple.  Cardiovascular: Normal rate, regular rhythm and normal heart sounds.  Pulmonary/Chest: Effort normal and breath sounds normal. No respiratory distress. She has no wheezes.  Lymphadenopathy:    She has no cervical adenopathy.  Neurological: She is alert and oriented to person, place, and time.  Psychiatric: She has a normal mood and affect. Her behavior is normal.  Vitals reviewed.         Assessment & Plan:

## 2016-12-16 NOTE — Assessment & Plan Note (Signed)
Pt's sxs and PE are consistent w/ infxn.  Start abx.  Reviewed supportive care and red flags that should prompt return.  Pt expressed understanding and is in agreement w/ plan.

## 2016-12-22 ENCOUNTER — Encounter: Payer: Self-pay | Admitting: Family Medicine

## 2016-12-22 DIAGNOSIS — Z01419 Encounter for gynecological examination (general) (routine) without abnormal findings: Secondary | ICD-10-CM | POA: Diagnosis not present

## 2016-12-22 DIAGNOSIS — Z6824 Body mass index (BMI) 24.0-24.9, adult: Secondary | ICD-10-CM | POA: Diagnosis not present

## 2016-12-22 DIAGNOSIS — N958 Other specified menopausal and perimenopausal disorders: Secondary | ICD-10-CM | POA: Diagnosis not present

## 2016-12-22 DIAGNOSIS — M8588 Other specified disorders of bone density and structure, other site: Secondary | ICD-10-CM | POA: Diagnosis not present

## 2016-12-29 DIAGNOSIS — R531 Weakness: Secondary | ICD-10-CM | POA: Diagnosis not present

## 2016-12-29 DIAGNOSIS — M25612 Stiffness of left shoulder, not elsewhere classified: Secondary | ICD-10-CM | POA: Diagnosis not present

## 2016-12-29 DIAGNOSIS — M25512 Pain in left shoulder: Secondary | ICD-10-CM | POA: Diagnosis not present

## 2016-12-29 DIAGNOSIS — M7502 Adhesive capsulitis of left shoulder: Secondary | ICD-10-CM | POA: Diagnosis not present

## 2016-12-31 DIAGNOSIS — Z4689 Encounter for fitting and adjustment of other specified devices: Secondary | ICD-10-CM | POA: Diagnosis not present

## 2016-12-31 DIAGNOSIS — M4322 Fusion of spine, cervical region: Secondary | ICD-10-CM | POA: Diagnosis not present

## 2016-12-31 DIAGNOSIS — M542 Cervicalgia: Secondary | ICD-10-CM | POA: Diagnosis not present

## 2016-12-31 DIAGNOSIS — M47892 Other spondylosis, cervical region: Secondary | ICD-10-CM | POA: Diagnosis not present

## 2016-12-31 DIAGNOSIS — M5412 Radiculopathy, cervical region: Secondary | ICD-10-CM | POA: Diagnosis not present

## 2017-01-01 ENCOUNTER — Other Ambulatory Visit: Payer: Self-pay | Admitting: Orthopaedic Surgery

## 2017-01-01 DIAGNOSIS — M542 Cervicalgia: Secondary | ICD-10-CM

## 2017-01-10 ENCOUNTER — Ambulatory Visit
Admission: RE | Admit: 2017-01-10 | Discharge: 2017-01-10 | Disposition: A | Discharge status: Home / Self Care | Payer: Medicare Other | Source: Ambulatory Visit | Attending: Orthopaedic Surgery | Admitting: Orthopaedic Surgery

## 2017-01-10 DIAGNOSIS — M542 Cervicalgia: Secondary | ICD-10-CM

## 2017-01-10 DIAGNOSIS — M4802 Spinal stenosis, cervical region: Secondary | ICD-10-CM | POA: Diagnosis not present

## 2017-01-20 DIAGNOSIS — H16223 Keratoconjunctivitis sicca, not specified as Sjogren's, bilateral: Secondary | ICD-10-CM | POA: Diagnosis not present

## 2017-01-20 DIAGNOSIS — H01021 Squamous blepharitis right upper eyelid: Secondary | ICD-10-CM | POA: Diagnosis not present

## 2017-01-20 DIAGNOSIS — H01024 Squamous blepharitis left upper eyelid: Secondary | ICD-10-CM | POA: Diagnosis not present

## 2017-01-20 DIAGNOSIS — E119 Type 2 diabetes mellitus without complications: Secondary | ICD-10-CM | POA: Diagnosis not present

## 2017-01-20 DIAGNOSIS — H01022 Squamous blepharitis right lower eyelid: Secondary | ICD-10-CM | POA: Diagnosis not present

## 2017-01-20 DIAGNOSIS — H2513 Age-related nuclear cataract, bilateral: Secondary | ICD-10-CM | POA: Diagnosis not present

## 2017-01-20 DIAGNOSIS — H01025 Squamous blepharitis left lower eyelid: Secondary | ICD-10-CM | POA: Diagnosis not present

## 2017-01-20 LAB — HM DIABETES EYE EXAM

## 2017-01-28 ENCOUNTER — Other Ambulatory Visit: Payer: Self-pay | Admitting: Family Medicine

## 2017-01-28 DIAGNOSIS — M4322 Fusion of spine, cervical region: Secondary | ICD-10-CM | POA: Diagnosis not present

## 2017-01-28 DIAGNOSIS — M47892 Other spondylosis, cervical region: Secondary | ICD-10-CM | POA: Diagnosis not present

## 2017-01-28 DIAGNOSIS — M5412 Radiculopathy, cervical region: Secondary | ICD-10-CM | POA: Diagnosis not present

## 2017-01-28 DIAGNOSIS — M542 Cervicalgia: Secondary | ICD-10-CM | POA: Diagnosis not present

## 2017-01-29 ENCOUNTER — Telehealth: Payer: Self-pay | Admitting: Family Medicine

## 2017-01-29 NOTE — Telephone Encounter (Signed)
Received request for Surgical Clearance via fax from [facility]. Placed in bin up front with charge sheet.

## 2017-01-29 NOTE — Telephone Encounter (Signed)
Pt seen in September for medicare wellness. Last EKG 08/2016.

## 2017-02-02 ENCOUNTER — Encounter: Payer: Self-pay | Admitting: General Practice

## 2017-02-04 ENCOUNTER — Other Ambulatory Visit: Payer: Self-pay | Admitting: Family Medicine

## 2017-02-04 NOTE — Telephone Encounter (Signed)
Last OV 10/14/16 Alprazolam last filled 11/12/16 #90 with 0

## 2017-02-04 NOTE — Telephone Encounter (Signed)
Medication filled to pharmacy as requested.   

## 2017-02-19 DIAGNOSIS — M50123 Cervical disc disorder at C6-C7 level with radiculopathy: Secondary | ICD-10-CM | POA: Diagnosis not present

## 2017-02-19 DIAGNOSIS — M542 Cervicalgia: Secondary | ICD-10-CM | POA: Diagnosis not present

## 2017-02-22 ENCOUNTER — Other Ambulatory Visit: Payer: Self-pay | Admitting: Family Medicine

## 2017-02-23 DIAGNOSIS — G609 Hereditary and idiopathic neuropathy, unspecified: Secondary | ICD-10-CM | POA: Diagnosis not present

## 2017-02-23 DIAGNOSIS — E039 Hypothyroidism, unspecified: Secondary | ICD-10-CM | POA: Diagnosis not present

## 2017-02-23 DIAGNOSIS — E7800 Pure hypercholesterolemia, unspecified: Secondary | ICD-10-CM | POA: Diagnosis not present

## 2017-02-23 DIAGNOSIS — N189 Chronic kidney disease, unspecified: Secondary | ICD-10-CM | POA: Diagnosis not present

## 2017-02-23 DIAGNOSIS — E1142 Type 2 diabetes mellitus with diabetic polyneuropathy: Secondary | ICD-10-CM | POA: Diagnosis not present

## 2017-02-23 LAB — HEMOGLOBIN A1C: Hemoglobin A1C: 7.4

## 2017-02-25 ENCOUNTER — Encounter: Payer: Self-pay | Admitting: General Practice

## 2017-03-02 DIAGNOSIS — Z01818 Encounter for other preprocedural examination: Secondary | ICD-10-CM | POA: Diagnosis not present

## 2017-03-02 DIAGNOSIS — M50123 Cervical disc disorder at C6-C7 level with radiculopathy: Secondary | ICD-10-CM | POA: Diagnosis not present

## 2017-03-02 DIAGNOSIS — H01009 Unspecified blepharitis unspecified eye, unspecified eyelid: Secondary | ICD-10-CM | POA: Diagnosis not present

## 2017-03-02 DIAGNOSIS — Z01810 Encounter for preprocedural cardiovascular examination: Secondary | ICD-10-CM | POA: Diagnosis not present

## 2017-03-09 DIAGNOSIS — Z981 Arthrodesis status: Secondary | ICD-10-CM | POA: Diagnosis not present

## 2017-03-09 DIAGNOSIS — M4722 Other spondylosis with radiculopathy, cervical region: Secondary | ICD-10-CM | POA: Diagnosis not present

## 2017-03-09 DIAGNOSIS — M50123 Cervical disc disorder at C6-C7 level with radiculopathy: Secondary | ICD-10-CM | POA: Diagnosis not present

## 2017-03-09 DIAGNOSIS — M47892 Other spondylosis, cervical region: Secondary | ICD-10-CM | POA: Diagnosis not present

## 2017-03-09 DIAGNOSIS — E119 Type 2 diabetes mellitus without complications: Secondary | ICD-10-CM | POA: Diagnosis not present

## 2017-03-09 DIAGNOSIS — M4322 Fusion of spine, cervical region: Secondary | ICD-10-CM | POA: Diagnosis not present

## 2017-03-10 DIAGNOSIS — M4322 Fusion of spine, cervical region: Secondary | ICD-10-CM | POA: Diagnosis not present

## 2017-03-10 DIAGNOSIS — M4722 Other spondylosis with radiculopathy, cervical region: Secondary | ICD-10-CM | POA: Diagnosis not present

## 2017-03-10 DIAGNOSIS — E119 Type 2 diabetes mellitus without complications: Secondary | ICD-10-CM | POA: Diagnosis not present

## 2017-03-10 DIAGNOSIS — Z981 Arthrodesis status: Secondary | ICD-10-CM | POA: Diagnosis not present

## 2017-04-09 DIAGNOSIS — M4322 Fusion of spine, cervical region: Secondary | ICD-10-CM | POA: Diagnosis not present

## 2017-04-09 DIAGNOSIS — M542 Cervicalgia: Secondary | ICD-10-CM | POA: Diagnosis not present

## 2017-04-13 ENCOUNTER — Encounter: Payer: Self-pay | Admitting: Family Medicine

## 2017-04-13 ENCOUNTER — Other Ambulatory Visit: Payer: Self-pay

## 2017-04-13 ENCOUNTER — Ambulatory Visit (INDEPENDENT_AMBULATORY_CARE_PROVIDER_SITE_OTHER): Payer: Medicare Other | Admitting: Family Medicine

## 2017-04-13 VITALS — BP 111/65 | HR 78 | Temp 98.0°F | Resp 16 | Ht 64.0 in | Wt 141.2 lb

## 2017-04-13 DIAGNOSIS — F411 Generalized anxiety disorder: Secondary | ICD-10-CM

## 2017-04-13 DIAGNOSIS — K219 Gastro-esophageal reflux disease without esophagitis: Secondary | ICD-10-CM

## 2017-04-13 DIAGNOSIS — E785 Hyperlipidemia, unspecified: Secondary | ICD-10-CM

## 2017-04-13 DIAGNOSIS — E1142 Type 2 diabetes mellitus with diabetic polyneuropathy: Secondary | ICD-10-CM

## 2017-04-13 LAB — LIPID PANEL
Cholesterol: 163 mg/dL (ref 0–200)
HDL: 59 mg/dL (ref >39.00)
LDL Cholesterol: 74 mg/dL (ref 0–99)
NonHDL: 104.16
Total CHOL/HDL Ratio: 3
Triglycerides: 153 mg/dL — ABNORMAL HIGH (ref 0.0–149.0)
VLDL: 30.6 mg/dL (ref 0.0–40.0)

## 2017-04-13 LAB — BASIC METABOLIC PANEL
BUN: 25 mg/dL — ABNORMAL HIGH (ref 6–23)
CO2: 28 mEq/L (ref 19–32)
Calcium: 9.4 mg/dL (ref 8.4–10.5)
Chloride: 102 mEq/L (ref 96–112)
Creatinine, Ser: 0.82 mg/dL (ref 0.40–1.20)
GFR: 72.99 mL/min (ref >60.00)
Glucose, Bld: 305 mg/dL — ABNORMAL HIGH (ref 70–99)
Potassium: 4.1 mEq/L (ref 3.5–5.1)
Sodium: 137 mEq/L (ref 135–145)

## 2017-04-13 LAB — HEPATIC FUNCTION PANEL
ALT: 14 U/L (ref 0–35)
AST: 15 U/L (ref 0–37)
Albumin: 4.1 g/dL (ref 3.5–5.2)
Alkaline Phosphatase: 86 U/L (ref 39–117)
Bilirubin, Direct: 0.1 mg/dL (ref 0.0–0.3)
Total Bilirubin: 0.4 mg/dL (ref 0.2–1.2)
Total Protein: 6.7 g/dL (ref 6.0–8.3)

## 2017-04-13 MED ORDER — PANTOPRAZOLE SODIUM 40 MG PO TBEC: 40.0000 mg | DELAYED_RELEASE_TABLET | Freq: Every day | ORAL | 1 refills | Status: DC

## 2017-04-13 MED ORDER — ROSUVASTATIN CALCIUM 10 MG PO TABS: ORAL_TABLET | ORAL | 1 refills | Status: DC

## 2017-04-13 MED ORDER — ALPRAZOLAM 0.5 MG PO TABS: 0.5000 mg | ORAL_TABLET | Freq: Every evening | ORAL | 0 refills | Status: DC | PRN

## 2017-04-13 NOTE — Assessment & Plan Note (Signed)
Chronic problem.  Controlled substance agreement updated today and UDS collected.  Prescription refilled.

## 2017-04-13 NOTE — Progress Notes (Signed)
   Subjective:    Patient ID: FABIANNA KEATS, female    DOB: Jul 16, 1945, 72 y.o.   MRN: [id number]  HPI Hyperlipidemia- chronic problem, on Crestor.  Hx of good control.  No CP, SOB, abd pain, N/V.  GERD- pt has hx of GERD w/ strictures that have required dilation.  Pt sees Dr Havery Moros.  She just had cervical spine revision 5 weeks ago and Dr Patrice Paradise is asking her to hold on another dilation.  Pt is having hoarseness and difficulty swallowing.  Currently on Omeprazole 20mg  daily  Anxiety/Insomnia- pt is due for refill on Alprazolam.  New controlled substance agreement printed.  Pt to give UDS   Review of Systems For ROS see HPI     Objective:   Physical Exam  Constitutional: She is oriented to person, place, and time. She appears well-developed and well-nourished. No distress.  HENT:  Head: Normocephalic and atraumatic.  Eyes: Pupils are equal, round, and reactive to light. Conjunctivae and EOM are normal.  Neck:  Soft neck brace in place  Cardiovascular: Normal rate, regular rhythm, normal heart sounds and intact distal pulses.  No murmur heard. Pulmonary/Chest: Effort normal and breath sounds normal. No respiratory distress.  Abdominal: Soft. She exhibits no distension. There is no tenderness.  Musculoskeletal: She exhibits no edema.  Neurological: She is alert and oriented to person, place, and time.  Skin: Skin is warm and dry.  Psychiatric: She has a normal mood and affect. Her behavior is normal.  Vitals reviewed.         Assessment & Plan:

## 2017-04-13 NOTE — Assessment & Plan Note (Signed)
Deteriorated.  Pt sees Dr Havery Moros but is not able to have a repeat dilation due to recent cervical spine surgery.  Switch Omeprazole to Pantoprazole and monitor for improvement.

## 2017-04-13 NOTE — Assessment & Plan Note (Signed)
Following w/ Dr Chalmers Cater.  UTD on foot exam, eye exam.

## 2017-04-13 NOTE — Patient Instructions (Signed)
Schedule your Medicare Wellness Visit in 6 months and a follow up with me to recheck cholesterol We'll notify you of your lab results and make any changes if needed STOP the Omeprazole START the Pantoprazole daily to improve reflux Call with any questions or concerns Happy Spring!!!

## 2017-04-13 NOTE — Assessment & Plan Note (Signed)
Chronic problem.  Tolerating statin w/o difficulty.  Check labs.  Adjust meds prn  

## 2017-04-14 ENCOUNTER — Encounter: Payer: Self-pay | Admitting: General Practice

## 2017-04-15 LAB — PAIN MGMT, PROFILE 8 W/CONF, U
6 Acetylmorphine: NEGATIVE ng/mL (ref <10)
Alcohol Metabolites: POSITIVE ng/mL — AB (ref <500)
Alphahydroxyalprazolam: 100 ng/mL — ABNORMAL HIGH (ref <25)
Alphahydroxymidazolam: NEGATIVE ng/mL (ref <50)
Alphahydroxytriazolam: NEGATIVE ng/mL (ref <50)
Aminoclonazepam: NEGATIVE ng/mL (ref <25)
Amphetamines: NEGATIVE ng/mL (ref <500)
Benzodiazepines: POSITIVE ng/mL — AB (ref <100)
Buprenorphine, Urine: NEGATIVE ng/mL (ref <5)
Cocaine Metabolite: NEGATIVE ng/mL (ref <150)
Creatinine: 98.3 mg/dL
Ethyl Glucuronide (ETG): 8881 ng/mL — ABNORMAL HIGH (ref <500)
Ethyl Sulfate (ETS): 1755 ng/mL — ABNORMAL HIGH (ref <100)
Hydroxyethylflurazepam: NEGATIVE ng/mL (ref <50)
Lorazepam: NEGATIVE ng/mL (ref <50)
MDMA: NEGATIVE ng/mL (ref <500)
Marijuana Metabolite: NEGATIVE ng/mL (ref <20)
Nordiazepam: NEGATIVE ng/mL (ref <50)
Opiates: NEGATIVE ng/mL (ref <100)
Oxazepam: NEGATIVE ng/mL (ref <50)
Oxidant: NEGATIVE ug/mL (ref <200)
Oxycodone: NEGATIVE ng/mL (ref <100)
Temazepam: NEGATIVE ng/mL (ref <50)
pH: 6.26 (ref 4.5–9.0)

## 2017-05-03 DIAGNOSIS — D225 Melanocytic nevi of trunk: Secondary | ICD-10-CM | POA: Diagnosis not present

## 2017-05-03 DIAGNOSIS — D1801 Hemangioma of skin and subcutaneous tissue: Secondary | ICD-10-CM | POA: Diagnosis not present

## 2017-05-03 DIAGNOSIS — Z85820 Personal history of malignant melanoma of skin: Secondary | ICD-10-CM | POA: Diagnosis not present

## 2017-05-11 ENCOUNTER — Other Ambulatory Visit: Payer: Self-pay | Admitting: Neurology

## 2017-05-17 ENCOUNTER — Encounter: Payer: Self-pay | Admitting: Family Medicine

## 2017-05-17 ENCOUNTER — Other Ambulatory Visit: Payer: Self-pay

## 2017-05-17 ENCOUNTER — Ambulatory Visit (INDEPENDENT_AMBULATORY_CARE_PROVIDER_SITE_OTHER): Payer: Medicare Other | Admitting: Family Medicine

## 2017-05-17 VITALS — BP 140/82 | HR 79 | Temp 97.9°F | Resp 16 | Ht 64.0 in | Wt 141.0 lb

## 2017-05-17 DIAGNOSIS — K5732 Diverticulitis of large intestine without perforation or abscess without bleeding: Secondary | ICD-10-CM | POA: Diagnosis not present

## 2017-05-17 LAB — BASIC METABOLIC PANEL
BUN: 23 mg/dL (ref 6–23)
CO2: 28 mEq/L (ref 19–32)
Calcium: 9.9 mg/dL (ref 8.4–10.5)
Chloride: 103 mEq/L (ref 96–112)
Creatinine, Ser: 0.73 mg/dL (ref 0.40–1.20)
GFR: 83.45 mL/min (ref >60.00)
Glucose, Bld: 106 mg/dL — ABNORMAL HIGH (ref 70–99)
Potassium: 4.4 mEq/L (ref 3.5–5.1)
Sodium: 140 mEq/L (ref 135–145)

## 2017-05-17 LAB — CBC WITH DIFFERENTIAL/PLATELET
Basophils Absolute: 0 10*3/uL (ref 0.0–0.1)
Basophils Relative: 0.4 % (ref 0.0–3.0)
Eosinophils Absolute: 0 10*3/uL (ref 0.0–0.7)
Eosinophils Relative: 0.7 % (ref 0.0–5.0)
HCT: 40 % (ref 36.0–46.0)
Hemoglobin: 13.5 g/dL (ref 12.0–15.0)
Lymphocytes Relative: 25.4 % (ref 12.0–46.0)
Lymphs Abs: 1.6 10*3/uL (ref 0.7–4.0)
MCHC: 33.7 g/dL (ref 30.0–36.0)
MCV: 91.5 fl (ref 78.0–100.0)
Monocytes Absolute: 0.7 10*3/uL (ref 0.1–1.0)
Monocytes Relative: 10.8 % (ref 3.0–12.0)
Neutro Abs: 4 10*3/uL (ref 1.4–7.7)
Neutrophils Relative %: 62.7 % (ref 43.0–77.0)
Platelets: 224 10*3/uL (ref 150.0–400.0)
RBC: 4.37 Mil/uL (ref 3.87–5.11)
RDW: 12.6 % (ref 11.5–15.5)
WBC: 6.4 10*3/uL (ref 4.0–10.5)

## 2017-05-17 LAB — HEPATIC FUNCTION PANEL
ALT: 13 U/L (ref 0–35)
AST: 17 U/L (ref 0–37)
Albumin: 4.3 g/dL (ref 3.5–5.2)
Alkaline Phosphatase: 82 U/L (ref 39–117)
Bilirubin, Direct: 0.1 mg/dL (ref 0.0–0.3)
Total Bilirubin: 0.4 mg/dL (ref 0.2–1.2)
Total Protein: 6.8 g/dL (ref 6.0–8.3)

## 2017-05-17 MED ORDER — CIPROFLOXACIN HCL 500 MG PO TABS: 500.0000 mg | ORAL_TABLET | Freq: Two times a day (BID) | ORAL | 0 refills | Status: DC

## 2017-05-17 NOTE — Progress Notes (Signed)
   Subjective:    Patient ID: [name] [name], female    DOB: [birth year], 72 y.o.   MRN: [id number]  HPI abd pain- pt reports she developed 'severe pains' 6 days ago under ribs bilaterally.  When she was out shopping on Wednesday, the pain radiated up into her chest.  Pt has hx of diverticulitis.  Pt also has hx of GERD.  No nausea.  + decreased appetite.  Denies urinary frequency, urgency, dysuria.  Stools have been loose and diarrhea like since last week.  No known sick contacts.  No fevers.  Pt reports pain worsened after taking her hyoscyamine Tuesday night but improved sxs on Wednesday.  Pain is not constant, occurs ~30 minutes after eating.  Pt has not eaten since 9am.  Hx of partial colectomy.  Pt says she is intolerant to Flagyl and was told by GI that she will be tx'd w/ cipro alone going forward.   Review of Systems For ROS see HPI     Objective:   Physical Exam  Constitutional: She is oriented to person, place, and time. She appears well-developed and well-nourished.  Obviously uncomfortable  HENT:  Head: Normocephalic and atraumatic.  Abdominal: Soft. Bowel sounds are normal. She exhibits no distension, no fluid wave and no ascites. There is no hepatosplenomegaly. There is tenderness in the right lower quadrant and left upper quadrant. There is no rigidity, no rebound and no guarding.  Neurological: She is alert and oriented to person, place, and time.  Skin: Skin is warm and dry.  Psychiatric: She has a normal mood and affect. Her behavior is normal.  Vitals reviewed.         Assessment & Plan:  Suspected diverticulitis- new.  Pt has hx of diverticulitis and reports this feels similar.  At that time, she was unable to tolerate Flagyl and was told by GI that going forward, the treatment would be Cipro alone.  Pt has RLQ TTP which raises some concern for appendicitis but given that pain is intermittent and she also has LUQ pain, this is unlikely.  Pt also has hx of partial  colectomy which is concerning for possible adhesions.  Also w/ hx of IBS and she did have some improvement w/ hyoscyamine.  Given the clinical picture, will start Cipro and check labs.  But if no improvement w/in 48 hrs or if at all worsening, will proceed w/ stat imaging or ER evaluation.  Pt expressed understanding and is in agreement w/ plan.

## 2017-05-17 NOTE — Patient Instructions (Signed)
Follow up as needed or as scheduled We'll notify you of your lab results and make any changes if needed START the Cipro twice daily x10 days Liquid diet until your pain improves- jello, broth, apple sauce, etc If no improvement after 48 hrs or if your symptoms are worsening at any time, please go to the ER or let me know so we can order a CT scan Call with any questions or concerns Hang in there!!!

## 2017-05-20 ENCOUNTER — Encounter: Payer: Self-pay | Admitting: Family Medicine

## 2017-05-20 DIAGNOSIS — K5732 Diverticulitis of large intestine without perforation or abscess without bleeding: Secondary | ICD-10-CM

## 2017-05-20 DIAGNOSIS — R109 Unspecified abdominal pain: Secondary | ICD-10-CM

## 2017-05-23 ENCOUNTER — Other Ambulatory Visit: Payer: Self-pay | Admitting: Family Medicine

## 2017-05-27 ENCOUNTER — Ambulatory Visit
Admission: RE | Admit: 2017-05-27 | Discharge: 2017-05-27 | Disposition: A | Discharge status: Home / Self Care | Payer: Medicare Other | Source: Ambulatory Visit | Attending: Family Medicine | Admitting: Family Medicine

## 2017-05-27 DIAGNOSIS — K5700 Diverticulitis of small intestine with perforation and abscess without bleeding: Secondary | ICD-10-CM | POA: Diagnosis not present

## 2017-05-27 DIAGNOSIS — R109 Unspecified abdominal pain: Secondary | ICD-10-CM

## 2017-05-27 DIAGNOSIS — K5732 Diverticulitis of large intestine without perforation or abscess without bleeding: Secondary | ICD-10-CM

## 2017-05-27 MED ORDER — IOPAMIDOL (ISOVUE-300) INJECTION 61%
100.0000 mL | Freq: Once | INTRAVENOUS | Status: AC | PRN
  Administered 2017-05-27: 100 mL via INTRAVENOUS

## 2017-06-01 ENCOUNTER — Ambulatory Visit: Payer: Medicare Other | Admitting: Gastroenterology

## 2017-06-10 DIAGNOSIS — M542 Cervicalgia: Secondary | ICD-10-CM | POA: Diagnosis not present

## 2017-06-10 DIAGNOSIS — M4322 Fusion of spine, cervical region: Secondary | ICD-10-CM | POA: Diagnosis not present

## 2017-06-13 ENCOUNTER — Encounter: Payer: Self-pay | Admitting: Family Medicine

## 2017-06-15 ENCOUNTER — Other Ambulatory Visit: Payer: Self-pay | Admitting: Physician Assistant

## 2017-06-15 ENCOUNTER — Encounter: Payer: Self-pay | Admitting: Physician Assistant

## 2017-06-15 ENCOUNTER — Ambulatory Visit: Payer: Medicare Other | Admitting: Physician Assistant

## 2017-06-15 ENCOUNTER — Other Ambulatory Visit: Payer: Self-pay

## 2017-06-15 ENCOUNTER — Ambulatory Visit (INDEPENDENT_AMBULATORY_CARE_PROVIDER_SITE_OTHER): Payer: Medicare Other | Admitting: Physician Assistant

## 2017-06-15 VITALS — BP 124/80 | HR 79 | Temp 97.8°F | Resp 16 | Ht 64.0 in | Wt 143.0 lb

## 2017-06-15 DIAGNOSIS — K2990 Gastroduodenitis, unspecified, without bleeding: Secondary | ICD-10-CM

## 2017-06-15 DIAGNOSIS — R1012 Left upper quadrant pain: Secondary | ICD-10-CM

## 2017-06-15 DIAGNOSIS — R1310 Dysphagia, unspecified: Secondary | ICD-10-CM | POA: Diagnosis not present

## 2017-06-15 DIAGNOSIS — K5900 Constipation, unspecified: Secondary | ICD-10-CM | POA: Diagnosis not present

## 2017-06-15 LAB — HEPATIC FUNCTION PANEL
ALT: 15 U/L (ref 0–35)
AST: 16 U/L (ref 0–37)
Albumin: 4.2 g/dL (ref 3.5–5.2)
Alkaline Phosphatase: 72 U/L (ref 39–117)
Bilirubin, Direct: 0.1 mg/dL (ref 0.0–0.3)
Total Bilirubin: 0.4 mg/dL (ref 0.2–1.2)
Total Protein: 6.7 g/dL (ref 6.0–8.3)

## 2017-06-15 LAB — LIPASE: Lipase: 28 U/L (ref 11.0–59.0)

## 2017-06-15 MED ORDER — SUCRALFATE 1 G PO TABS: 1.0000 g | ORAL_TABLET | Freq: Three times a day (TID) | ORAL | 3 refills | Status: DC

## 2017-06-15 MED ORDER — HYOSCYAMINE SULFATE 0.125 MG SL SUBL: 0.1250 mg | SUBLINGUAL_TABLET | Freq: Four times a day (QID) | SUBLINGUAL | 1 refills | Status: DC | PRN

## 2017-06-15 NOTE — Patient Instructions (Addendum)
Please go to the lab today for blood work.  I will call you with your results. We will alter treatment regimen(s) if indicated by your results.   Please stop by the front desk to speak with Levada Dy or Romelle Starcher who will help you schedule a follow-up with Gastroenterology.   Please continue the Omeprazole but take it twice daily for the next 2 weeks.  Use the Carafate as directed. Increase fluids. No alcohol or anti-inflammatories as this can exacerbate symptoms.   Follow-up with me in 1 week if you have not seen your GI specialist.   I encourage you to increase hydration and the amount of fiber in your diet.  Start a daily probiotic (Align, Culturelle, Digestive Advantage, etc.). If no bowel movement within 24 hours, take 2 Tbs of Milk of Magnesia in a 4 oz glass of warmed prune juice every 2-3 days to help promote bowel movement. If no results within 24 hours, then repeat above regimen, adding a Dulcolax stool softener to regimen. If this does not promote a bowel movement, please call the office.

## 2017-06-15 NOTE — Progress Notes (Signed)
Patient presents to clinic today c/o continued left upper abdominal and side pain present for > 1 month. Patient had prior assessment with PCP on 05/17/17. At that time there was some concern for diverticulitis. CT abdomen obtained revealing large stool burden but no evidence of acute intraabdominal pathology. Patient was instructed to start a bowel regimen and follow-up if symptoms were not resolving. Notes that she is following bowel regimen as directed. Is having a BM every other day now, sometimes still having to strain. Denies hematochezia, melena or tenesmus. Notes continued GERD despite PPI -- was on Protonix but unable to tolerate due to side effect so she switched back to her 30 mg Omeprazole. Denues use of NSAID or alcohol consumption.  Patient with history of stricture, noting recurrent of dysphagia or solid foods on occasion. Denies change to voice or chronic cough. Pain is worse after meals.    Past Medical History:  Diagnosis Date  . Abdominal pain    EGD and CTs neg, rx Amitriptiline (non ulcer dyspepsia) EGD 12/11.. s/p ballon dilitation  . Anxiety   . Cervical spondylosis without myelopathy 11/30/2012  . Chronic conjunctivitis   . Diabetes mellitus    no meds, diet controlled  . Diverticulosis of colon   . GERD with stricture    s/p dilatations  . History of Lyme disease   . HOH (hard of hearing)    bilateral hearing aids  . Hyperlipidemia   . Hypothyroidism     Dx 10-9 TSH 5.2 , then TSH normalized, eventually went up again 2012  . Melanoma ([place])    h/o  . Memory difficulties 06/05/2014  . Osteopenia   . Solar lentigo    face; s/p excision 3/09. See Derm q 6 months  . Trigeminal neuralgia    h/o     Current Outpatient Medications on File Prior to Visit  Medication Sig Dispense Refill  . ALPRAZolam (XANAX) 0.5 MG tablet Take 1 tablet (0.5 mg total) by mouth at bedtime as needed. for sleep 90 tablet 0  . amitriptyline (ELAVIL) 25 MG tablet Take 1 tablet (25 mg  total) by mouth at bedtime. 90 tablet 3  . Cholecalciferol (VITAMIN D3) 2000 UNITS TABS Take 1 tablet by mouth daily.     . diclofenac sodium (VOLTAREN) 1 % GEL Apply topically 4 (four) times daily.    [name] gabapentin (NEURONTIN) 300 MG capsule TAKE 1 CAPSULE BY MOUTH EVERY MORNING AND 2 CAPSULES BY MOUTH EVERY EVENING 270 capsule 1  . levothyroxine (SYNTHROID, LEVOTHROID) 50 MCG tablet TK 1 T PO QD  12  . Lifitegrast (XIIDRA) 5 % SOLN Apply to eye.    . Multiple Vitamin (MULTIVITAMIN) tablet Take 1 tablet by mouth daily.      [name] omeprazole (PRILOSEC) 20 MG capsule TK 1 C PO QD 30 MIN TO 1 HOUR AC  0  . Probiotic Product (PROBIOTIC PO) Take 1 tablet by mouth daily.    . propranolol (INDERAL) 10 MG tablet TAKE 1 TABLET BY MOUTH EVERY MORNING AND 1 TABLET BY MOUTH AT NIGHT 180 tablet 3  . rosuvastatin (CRESTOR) 10 MG tablet TAKE 1 TABLET(10 MG) BY MOUTH DAILY 90 tablet 1   No current facility-administered medications on file prior to visit.     Allergies  Allergen Reactions  . Rocephin [Ceftriaxone] Anaphylaxis  . Prednisone Other (See Comments)    High blood sugars (pt reports)  . Benadryl [Diphenhydramine Hcl] Anxiety    Hyperactivity    Family History  Problem Relation Age of Onset  . Prostate cancer Father   . Endometrial cancer Sister        clear cell carcinoma  . Stroke Sister   . Heart attack Paternal Grandfather        GF in his 69s  . Melanoma Maternal Uncle   . Colon cancer Neg Hx   . Breast cancer Neg Hx   . Diabetes Neg Hx   . Stomach cancer Neg Hx   . Rectal cancer Neg Hx     Social History   Socioeconomic History  . Marital status: Married    Spouse name: Percell Miller  . Number of children: 2  . Years of education: college  . Highest education level: Not on file  Occupational History  . Occupation: retired    Fish farm manager: RETIRED  Social Needs  . Financial resource strain: Not on file  . Food insecurity:    Worry: Not on file    Inability: Not on file  .  Transportation needs:    Medical: Not on file    Non-medical: Not on file  Tobacco Use  . Smoking status: Former Smoker    Start date: 01/20/1976  . Smokeless tobacco: Never Used  Substance and Sexual Activity  . Alcohol use: Yes    Alcohol/week: 1.2 - 1.8 oz    Types: 2 - 3 Glasses of wine per week  . Drug use: No  . Sexual activity: Not on file  Lifestyle  . Physical activity:    Days per week: Not on file    Minutes per session: Not on file  . Stress: Not on file  Relationships  . Social connections:    Talks on phone: Not on file    Gets together: Not on file    Attends religious service: Not on file    Active member of club or organization: Not on file    Attends meetings of clubs or organizations: Not on file    Relationship status: Not on file  Other Topics Concern  . Not on file  Social History Narrative   Patient is married with 2 children.   Patient is right handed.   Patient has college education.   Patient does not drink caffeine.          Review of Systems - See HPI.  All other ROS are negative.  BP 124/80   Pulse 79   Temp 97.8 F (36.6 C) (Oral)   Resp 16   Ht 5\' 4"  (1.626 m)   Wt 143 lb (64.9 kg)   SpO2 95%   BMI 24.55 kg/m   Physical Exam  Constitutional: She appears well-developed and well-nourished.  HENT:  Head: Normocephalic and atraumatic.  Mouth/Throat: Oropharynx is clear and moist.  Abdominal: Normal appearance and bowel sounds are normal. She exhibits no distension, no ascites and no pulsatile midline mass. There is no hepatosplenomegaly. There is tenderness in the epigastric area and left upper quadrant. There is no rebound and no CVA tenderness. No hernia.   Recent Results (from the past 2160 hour(s))  Pain Mgmt, Profile 8 w/Conf, U     Status: Abnormal   Collection Time: 04/13/17 10:31 AM  Result Value Ref Range   Creatinine 98.3 > or = 20. mg/dL   pH 6.26 4.5 - 9.0   Oxidant NEGATIVE <200 mcg/mL   Amphetamines NEGATIVE <500  ng/mL   medMATCH Amphetamines CONSISTENT    Benzodiazepines POSITIVE (A) <100 ng/mL   Alphahydroxyalprazolam 100 (H) <25  ng/mL    Comment: See Note 1   medMATCH aOH alprazolam INCONSISTENT    Alphahydroxymidazolam NEGATIVE <50 ng/mL    Comment: See Note 1   medMATCH aOH midazolam CONSISTENT    Alphahydroxytriazolam NEGATIVE <50 ng/mL    Comment: See Note 1   medMATCH aOH triazolam CONSISTENT    Aminoclonazepam NEGATIVE <25 ng/mL    Comment: See Note 1   medMATCH Aminoclonazepam CONSISTENT    Hydroxyethylflurazepam NEGATIVE <50 ng/mL    Comment: See Note 1   medMATCH OH,Et flurazepam CONSISTENT    Lorazepam NEGATIVE <50 ng/mL    Comment: See Note 1   medMATCH Lorazepam CONSISTENT    Nordiazepam NEGATIVE <50 ng/mL    Comment: See Note 1   medMATCH Nordiazepam CONSISTENT    Oxazepam NEGATIVE <50 ng/mL    Comment: See Note 1   medMATCH Oxazepam CONSISTENT    Temazepam NEGATIVE <50 ng/mL    Comment: See Note 1   medMATCH Temazepam CONSISTENT    Marijuana Metabolite NEGATIVE <20 ng/mL   medMATCH Marijuana Metab CONSISTENT    Cocaine Metabolite NEGATIVE <150 ng/mL   medMATCH Cocaine Metab CONSISTENT    Opiates NEGATIVE <100 ng/mL   medMATCH Opiates CONSISTENT    Oxycodone NEGATIVE <100 ng/mL   medMATCH Oxycodone CONSISTENT    Buprenorphine, Urine NEGATIVE <5 ng/mL   medMATCH Buprenorphine CONSISTENT    MDMA NEGATIVE <500 ng/mL   [facility] MDMA CONSISTENT    Alcohol Metabolites POSITIVE (A) <500 ng/mL   Ethyl Glucuronide (ETG) 8,881 (H) <500 ng/mL    Comment: See Note 1   medMATCH ETG INCONSISTENT    Ethyl Sulfate (ETS) 1,755 (H) <100 ng/mL    Comment: See Note 1   medMATCH ETS INCONSISTENT     Comment: Note 1 . This test was developed and its analytical performance  characteristics have been determined by [employer]. It has not been cleared or approved by the FDA. This assay has been validated pursuant to the CLIA  regulations and is used for clinical  purposes.    6 Acetylmorphine NEGATIVE <10 ng/mL   medMATCH 6 Acetylmorphine CONSISTENT     Comment: This drug testing is for medical treatment only.   Analysis was performed as non-forensic testing and  these results should be used only by healthcare  providers to render diagnosis or treatment, or to  monitor progress of medical conditions. Hazel Sams comments are:  - present when drug test results may be the result of     metabolism of one or more drugs or when results are     inconsistent with prescribed medication(s) listed.  - may be blank when drug results are consistent with     prescribed medication(s) listed. . For assistance with interpreting these drug results,  please contact a [employer] Toxicology  Specialist: [phone] [place] [phone]), M-F,  8am-6pm EST. This drug testing is for medical treatment only.   Analysis was performed as non-forensic testing and  these results should be used only by healthcare  providers to render diagnosis or treatment, or to  monitor progress of medical conditions. Hazel Sams comments are:  - present when  drug test results may be the result of     metabolism of one or more drugs or when results are     inconsistent with prescribed medication(s) listed.  - may be blank when drug results are consistent with     prescribed medication(s) listed. . For assistance with interpreting these drug results,  please contact a [employer] Toxicology  Specialist: [phone] [place] [phone]), M-F,  8am-6pm EST. This drug testing is for medical treatment only.   Analysis was performed as non-forensic testing and  these results should be used only by healthcare  providers to render diagnosis or treatment, or to  monitor progress of medical conditions. Hazel Sams comments are:  - present when drug test results may be the result of     metabolism of one or more drugs or when results are     inconsistent with prescribed  medication(s) listed.  - may be blank when drug results are consistent with     prescribed medication(s) listed. . For assistance with interpreting these d rug results,  please contact a [employer] Toxicology  Specialist: [phone] [place] [phone]), M-F,  8am-6pm EST. This drug testing is for medical treatment only.   Analysis was performed as non-forensic testing and  these results should be used only by healthcare  providers to render diagnosis or treatment, or to  monitor progress of medical conditions. Hazel Sams comments are:  - present when drug test results may be the result of     metabolism of one or more drugs or when results are     inconsistent with prescribed medication(s) listed.  - may be blank when drug results are consistent with     prescribed medication(s) listed. . For assistance with interpreting these drug results,  please contact a [employer] Toxicology  Specialist: [phone] [place] [phone]), M-F,  8am-6pm EST. This drug testing is for medical treatment only.   Analysis was performed as non-forensic testing and  these results should be used only by healthcare  provi ders to render diagnosis or treatment, or to  monitor progress of medical conditions. Hazel Sams comments are:  - present when drug test results may be the result of     metabolism of one or more drugs or when results are     inconsistent with prescribed medication(s) listed.  - may be blank when drug results are consistent with     prescribed medication(s) listed. . For assistance with interpreting these drug results,  please contact a [employer] Toxicology  Specialist: [phone] [place] [phone]), M-F,  8am-6pm EST.   Lipid panel     Status: Abnormal   Collection Time: 04/13/17 10:31 AM  Result Value Ref Range   Cholesterol 163 0 - 200 mg/dL    Comment: ATP III Classification       Desirable:  < 200 mg/dL               Borderline High:  200 -  239 mg/dL          High:  > = 240 mg/dL   Triglycerides 153.0 (H) 0.0 - 149.0 mg/dL    Comment: Normal:  <150 mg/dLBorderline High:  150 - 199 mg/dL   HDL 59.00 >39.00 mg/dL   VLDL 30.6 0.0 - 40.0 mg/dL   LDL Cholesterol 74 0 - 99 mg/dL   Total CHOL/HDL Ratio 3     Comment:                Men          Women1/2 Average Risk     3.4          3.3Average Risk          5.0          4.42X Average Risk  9.6          7.13X Average Risk          15.0          11.0                       NonHDL 104.16     Comment: NOTE:  Non-HDL goal should be 30 mg/dL higher than patient's LDL goal (i.e. LDL goal of < 70 mg/dL, would have non-HDL goal of < 100 mg/dL)  Basic metabolic panel     Status: Abnormal   Collection Time: 04/13/17 10:31 AM  Result Value Ref Range   Sodium 137 135 - 145 mEq/L   Potassium 4.1 3.5 - 5.1 mEq/L   Chloride 102 96 - 112 mEq/L   CO2 28 19 - 32 mEq/L   Glucose, Bld 305 (H) 70 - 99 mg/dL   BUN 25 (H) 6 - 23 mg/dL   Creatinine, Ser 0.82 0.40 - 1.20 mg/dL   Calcium 9.4 8.4 - 10.5 mg/dL   GFR 72.99 >60.00 mL/min  Hepatic function panel     Status: None   Collection Time: 04/13/17 10:31 AM  Result Value Ref Range   Total Bilirubin 0.4 0.2 - 1.2 mg/dL   Bilirubin, Direct 0.1 0.0 - 0.3 mg/dL   Alkaline Phosphatase 86 39 - 117 U/L   AST 15 0 - 37 U/L   ALT 14 0 - 35 U/L   Total Protein 6.7 6.0 - 8.3 g/dL   Albumin 4.1 3.5 - 5.2 g/dL  CBC with Differential/Platelet     Status: None   Collection Time: 05/17/17  2:13 PM  Result Value Ref Range   WBC 6.4 4.0 - 10.5 K/uL   RBC 4.37 3.87 - 5.11 Mil/uL   Hemoglobin 13.5 12.0 - 15.0 g/dL   HCT 40.0 36.0 - 46.0 %   MCV 91.5 78.0 - 100.0 fl   MCHC 33.7 30.0 - 36.0 g/dL   RDW 12.6 11.5 - 15.5 %   Platelets 224.0 150.0 - 400.0 K/uL   Neutrophils Relative % 62.7 43.0 - 77.0 %   Lymphocytes Relative 25.4 12.0 - 46.0 %   Monocytes Relative 10.8 3.0 - 12.0 %   Eosinophils Relative 0.7 0.0 - 5.0 %   Basophils Relative 0.4 0.0 - 3.0  %   Neutro Abs 4.0 1.4 - 7.7 K/uL   Lymphs Abs 1.6 0.7 - 4.0 K/uL   Monocytes Absolute 0.7 0.1 - 1.0 K/uL   Eosinophils Absolute 0.0 0.0 - 0.7 K/uL   Basophils Absolute 0.0 0.0 - 0.1 K/uL  Basic metabolic panel     Status: Abnormal   Collection Time: 05/17/17  2:13 PM  Result Value Ref Range   Sodium 140 135 - 145 mEq/L   Potassium 4.4 3.5 - 5.1 mEq/L   Chloride 103 96 - 112 mEq/L   CO2 28 19 - 32 mEq/L   Glucose, Bld 106 (H) 70 - 99 mg/dL   BUN 23 6 - 23 mg/dL   Creatinine, Ser 0.73 0.40 - 1.20 mg/dL   Calcium 9.9 8.4 - 10.5 mg/dL   GFR 83.45 >60.00 mL/min  Hepatic function panel     Status: None   Collection Time: 05/17/17  2:13 PM  Result Value Ref Range   Total Bilirubin 0.4 0.2 - 1.2 mg/dL   Bilirubin, Direct 0.1 0.0 - 0.3 mg/dL   Alkaline Phosphatase 82 39 - 117 U/L   AST 17 0 - 37  U/L   ALT 13 0 - 35 U/L   Total Protein 6.8 6.0 - 8.3 g/dL   Albumin 4.3 3.5 - 5.2 g/dL    Assessment/Plan: 1. Gastritis and duodenitis Seems most likely cause of symptoms. Some concern for ulcer. No evidence of rectal bleeding or melena per patient. Exam with significant LUQ pain and mild epigastric pain. No other pain on exam. BS within normal limits. Recent CT negative for acute abdomen. Will check repeat LFTS, Lipase and H. Pylori IgG. Increase Omeprazole to 20 mg BID. Start short course of Carafate with meals. Will get her back in with her GI for further management of multiple chronic/recurrent gastrointestinal issues.  - Hepatic function panel - Lipase  - H. pylori antibody, IgG - sucralfate (CARAFATE) 1 g tablet; Take 1 tablet (1 g total) by mouth 4 (four) times daily -  with meals and at bedtime.  Dispense: 30 tablet; Refill: 3  2. Constipation, unspecified constipation type Bowel regimen reviewed with patient. Patient to schedule follow-up with GI.  3. Dysphagia, unspecified type In patient with history of recurrent esophageal strictures s/p 3 dilations. [facility] helped to schedule  follow-up with GI provider for assessment and EGD.   Leeanne Rio, PA-C

## 2017-06-16 ENCOUNTER — Other Ambulatory Visit: Payer: Self-pay | Admitting: Physician Assistant

## 2017-06-16 LAB — H. PYLORI ANTIBODY, IGG: H Pylori IgG: NEGATIVE

## 2017-06-16 MED ORDER — OMEPRAZOLE 20 MG PO CPDR: DELAYED_RELEASE_CAPSULE | ORAL | 3 refills | Status: DC

## 2017-06-19 HISTORY — PX: CERVICAL FUSION: SHX112

## 2017-06-22 ENCOUNTER — Encounter: Payer: Self-pay | Admitting: Gastroenterology

## 2017-06-22 ENCOUNTER — Telehealth: Payer: Self-pay | Admitting: Gastroenterology

## 2017-06-22 NOTE — Telephone Encounter (Signed)
Please call and schedule patient with APP at sooner appointment. [name] [name] has a new patient appointment available tomorrow morning. Thank you.

## 2017-07-07 ENCOUNTER — Encounter: Payer: Self-pay | Admitting: Gastroenterology

## 2017-07-07 ENCOUNTER — Ambulatory Visit (INDEPENDENT_AMBULATORY_CARE_PROVIDER_SITE_OTHER): Payer: Medicare Other | Admitting: Gastroenterology

## 2017-07-07 ENCOUNTER — Encounter

## 2017-07-07 VITALS — BP 110/60 | HR 88 | Ht 64.0 in | Wt 142.0 lb

## 2017-07-07 DIAGNOSIS — K5900 Constipation, unspecified: Secondary | ICD-10-CM

## 2017-07-07 DIAGNOSIS — Z8719 Personal history of other diseases of the digestive system: Secondary | ICD-10-CM

## 2017-07-07 DIAGNOSIS — R490 Dysphonia: Secondary | ICD-10-CM | POA: Diagnosis not present

## 2017-07-07 DIAGNOSIS — R1310 Dysphagia, unspecified: Secondary | ICD-10-CM | POA: Diagnosis not present

## 2017-07-07 DIAGNOSIS — R1012 Left upper quadrant pain: Secondary | ICD-10-CM | POA: Diagnosis not present

## 2017-07-07 MED ORDER — OMEPRAZOLE 20 MG PO CPDR: 20.0000 mg | DELAYED_RELEASE_CAPSULE | Freq: Two times a day (BID) | ORAL | 2 refills | Status: DC

## 2017-07-07 NOTE — Patient Instructions (Addendum)
We have sent the following medications to your pharmacy for you to pick up at your convenience:  Omeprazole 20 mg twice a day  Use Miralax daily.   You have been scheduled for an endoscopy. Please follow written instructions given to you at your visit today. If you use inhalers (even only as needed), please bring them with you on the day of your procedure. Your physician has requested that you go to www.[email or web address] and enter the access code given to you at your visit today. This web site gives a general overview about your procedure. However, you should still follow specific instructions given to you by our office regarding your preparation for the procedure.

## 2017-07-07 NOTE — Progress Notes (Signed)
07/07/2017 [name] [name] [id number] 1945/07/25   HISTORY OF PRESENT ILLNESS:  This is a pleasant 72 year old female who is known to Dr. Havery Moros.  She is here today with complaints of dysphagia and hoarseness.  She does have a history of esophageal stricture requiring dilation in the past (last was 01/2014).  She did have cervical spine surgery with anterior approach 4 months ago and she says that they really had to move her esophagus out of the way because she had so much scar tissue from a previous cervical spine surgery.  She just increased her omeprazole to 20 mg BID, but now she has run out.  Also complaining of LUQ abdominal pain that she says feels like her stomach lining is very irritated.  Was given carafate tabs 4 times daily, but she is having trouble swallowing them.  She was also complaining of LLQ abdominal pain.  She had a CT scan that was negative for diverticulitis, but that was performed after completion of a course of Cipro that was given to her by her PCP.  CT scan did show large stool burden so ? If pain was from that.  Feels like the daily fiber does not help her move her bowels well.  No LLQ abdominal pain at this point.   Past Medical History:  Diagnosis Date  . Abdominal pain    EGD and CTs neg, rx Amitriptiline (non ulcer dyspepsia) EGD 12/11.. s/p ballon dilitation  . Anxiety   . Cervical spondylosis without myelopathy 11/30/2012  . Chronic conjunctivitis   . Diabetes mellitus    no meds, diet controlled  . Diverticulosis of colon   . GERD with stricture    s/p dilatations  . History of Lyme disease   . HOH (hard of hearing)    bilateral hearing aids  . Hyperlipidemia   . Hypothyroidism     Dx 10-9 TSH 5.2 , then TSH normalized, eventually went up again 2012  . Melanoma ([place])    h/o  . Memory difficulties 06/05/2014  . Osteopenia   . Solar lentigo    face; s/p excision 3/09. See Derm q 6 months  . Trigeminal neuralgia    h/o    Past Surgical  History:  Procedure Laterality Date  . ABDOMINAL HYSTERECTOMY    . ANKLE SURGERY  07/01/2015   R andkle tendon surgery  . BREAST BIOPSY Left 1988  . BREAST EXCISIONAL BIOPSY Left 1988  . CERVICAL FUSION    . CHOLECYSTECTOMY    . COLON RESECTION  1999   d/t diverticulitis  . COLON SURGERY    . melanoma resection Right     Right Arm Surgery, R facial surgery (05-2012)   . PTX s/t chest tube    . SHOULDER SURGERY     right  . WRIST SURGERY     removed melaoma, right    reports that she has quit smoking. She started smoking about 41 years ago. She has never used smokeless tobacco. She reports that she drinks about 1.2 - 1.8 oz of alcohol per week. She reports that she does not use drugs. family history includes Endometrial cancer in her sister; Heart attack in her paternal grandfather; Melanoma in her maternal uncle; Prostate cancer in her father; Stroke in her sister. Allergies  Allergen Reactions  . Rocephin [Ceftriaxone] Anaphylaxis  . Prednisone Other (See Comments)    High blood sugars (pt reports)  . Benadryl [Diphenhydramine Hcl] Anxiety    Hyperactivity  Outpatient Encounter Medications as of 07/07/2017  Medication Sig  . ALPRAZolam (XANAX) 0.5 MG tablet Take 1 tablet (0.5 mg total) by mouth at bedtime as needed. for sleep  . amitriptyline (ELAVIL) 25 MG tablet Take 1 tablet (25 mg total) by mouth at bedtime.  . Cholecalciferol (VITAMIN D3) 2000 UNITS TABS Take 1 tablet by mouth daily.   . diclofenac sodium (VOLTAREN) 1 % GEL Apply topically 4 (four) times daily.  [name] gabapentin (NEURONTIN) 300 MG capsule TAKE 1 CAPSULE BY MOUTH EVERY MORNING AND 2 CAPSULES BY MOUTH EVERY EVENING  . hyoscyamine (LEVSIN SL) 0.125 MG SL tablet Place 1 tablet (0.125 mg total) under the tongue every 6 (six) hours as needed for cramping.  [name] levothyroxine (SYNTHROID, LEVOTHROID) 50 MCG tablet TK 1 T PO QD  . Lifitegrast (XIIDRA) 5 % SOLN Apply to eye.  . Multiple Vitamin (MULTIVITAMIN)  tablet Take 1 tablet by mouth daily.    [name] omeprazole (PRILOSEC) 20 MG capsule TK 1 C PO QD 30 MIN TO 1 HOUR AC  . Probiotic Product (PROBIOTIC PO) Take 1 tablet by mouth daily.  . propranolol (INDERAL) 10 MG tablet TAKE 1 TABLET BY MOUTH EVERY MORNING AND 1 TABLET BY MOUTH AT NIGHT  . rosuvastatin (CRESTOR) 10 MG tablet TAKE 1 TABLET(10 MG) BY MOUTH DAILY  . sucralfate (CARAFATE) 1 g tablet Take 1 tablet (1 g total) by mouth 4 (four) times daily -  with meals and at bedtime.   No facility-administered encounter medications on file as of 07/07/2017.      REVIEW OF SYSTEMS  : All other systems reviewed and negative except where noted in the History of Present Illness.   PHYSICAL EXAM: BP 110/60   Pulse 88   Ht 5\' 4"  (1.626 m)   Wt 142 lb (64.4 kg)   BMI 24.37 kg/m  General: Well developed white female in no acute distress Head: Normocephalic and atraumatic Eyes:  Sclerae anicteric, conjunctiva pink. Ears: Normal auditory acuity Lungs: Clear throughout to auscultation; no increased WOB. Heart: Regular rate and rhythm; no W/R/R. Abdomen: Soft, non-distended.  BS present.  LUQ TTP. Musculoskeletal: Symmetrical with no gross deformities  Skin: No lesions on visible extremities Extremities: No edema  Neurological: Alert oriented x 4, grossly non-focal Psychological:  Alert and cooperative. Normal mood and affect  ASSESSMENT AND PLAN: *Dysphagia and hoarseness:  She does have a history of esophageal stricture requiring dilation in the past and I think that she may have a recurrence of that, however, she did have cervical spine surgery with anterior approach (4 months ago) that certainly could have caused some symptoms as well.  The hoarseness could be from reflux but ? If nerve/vocal cords were affected.  Will plan for EGD with dilation with Dr. Havery Moros.  I am going to have her continue her omeprazole 20 mg BID for now as she just increased it from once daily a short time ago.  Also has  LUQ abdominal pain, which can be evaluated with EGD as well *LLQ abdominal pain:  That has resolved at this point.  She had a CT scan that was negative for diverticulitis, but that was performed after completion of a course of Cipro.  It did show large stool burden so ? If pain was from that.  Will start Miralax daily.  **The risks, benefits, and alternatives to EGD with dilation were discussed with the patient and she consents to proceed.   CC:  Midge Minium, MD

## 2017-07-09 NOTE — Progress Notes (Signed)
Agree with assessment and plan as outlined.  

## 2017-07-19 ENCOUNTER — Other Ambulatory Visit: Payer: Self-pay

## 2017-07-19 ENCOUNTER — Ambulatory Visit (AMBULATORY_SURGERY_CENTER): Payer: Medicare Other | Admitting: Gastroenterology

## 2017-07-19 ENCOUNTER — Encounter: Payer: Self-pay | Admitting: Gastroenterology

## 2017-07-19 VITALS — BP 118/70 | HR 62 | Temp 98.0°F | Resp 22 | Ht 64.0 in | Wt 142.0 lb

## 2017-07-19 DIAGNOSIS — R1013 Epigastric pain: Secondary | ICD-10-CM | POA: Diagnosis not present

## 2017-07-19 DIAGNOSIS — E119 Type 2 diabetes mellitus without complications: Secondary | ICD-10-CM | POA: Diagnosis not present

## 2017-07-19 DIAGNOSIS — K317 Polyp of stomach and duodenum: Secondary | ICD-10-CM | POA: Diagnosis not present

## 2017-07-19 DIAGNOSIS — R1310 Dysphagia, unspecified: Secondary | ICD-10-CM | POA: Diagnosis present

## 2017-07-19 DIAGNOSIS — K3189 Other diseases of stomach and duodenum: Secondary | ICD-10-CM | POA: Diagnosis not present

## 2017-07-19 MED ORDER — SODIUM CHLORIDE 0.9 % IV SOLN: 500.0000 mL | Freq: Once | INTRAVENOUS | Status: DC

## 2017-07-19 NOTE — Op Note (Signed)
[facility] Patient Name: [name] [name] Procedure Date: 07/19/2017 2:18 PM MRN: [id number] Endoscopist: [name] [name] , MD Age: 72 Referring MD:  Date of Birth: 25-Jul-1945 Gender: Female Account #: [id number] Procedure:                Upper GI endoscopy Indications:              Abdominal pain in the left upper quadrant,                            Dysphagia to pills Medicines:                Monitored Anesthesia Care Procedure:                Pre-Anesthesia Assessment:                           - Prior to the procedure, a History and Physical                            was performed, and patient medications and                            allergies were reviewed. The patient's tolerance of                            previous anesthesia was also reviewed. The risks                            and benefits of the procedure and the sedation                            options and risks were discussed with the patient.                            All questions were answered, and informed consent                            was obtained. Prior Anticoagulants: The patient has                            taken no previous anticoagulant or antiplatelet                            agents. ASA Grade Assessment: II - A patient with                            mild systemic disease. After reviewing the risks                            and benefits, the patient was deemed in                            satisfactory condition to undergo the procedure.  After obtaining informed consent, the endoscope was                            passed under direct vision. Throughout the                            procedure, the patient's blood pressure, pulse, and                            oxygen saturations were monitored continuously. The                            Model GIF-HQ190 [id number]) scope was introduced                            through the mouth, and advanced to the  second part                            of duodenum. The upper GI endoscopy was                            accomplished without difficulty. The patient                            tolerated the procedure well. Scope In: Scope Out: Findings:                 Esophagogastric landmarks were identified: the                            Z-line was found at 37 cm, the gastroesophageal                            junction was found at 37 cm and the upper extent of                            the gastric folds was found at 37 cm from the                            incisors.                           The exam of the esophagus was otherwise normal. No                            focal stenosis / stricture appreciated.                           A guidewire was placed and the scope was withdrawn.                            Dilation was performed in the entire esophagus with  a Savary dilator with mild resistance at 16 mm and                            17 mm. Relook endoscopy showed an appropriate                            musocal wrent in the proximal esophagus / UES.                           A single 7 mm sessile polyp was found in the                            gastric body which was inflamed / erythematous. The                            polyp was removed with a hot snare. Resection and                            retrieval were complete. Other smaller (<65mm)                            benign appearing gastric polyps noted but not                            removed.                           The exam of the stomach was otherwise normal.                           Biopsies were taken with a cold forceps in the                            gastric body, at the incisura and in the gastric                            antrum for Helicobacter pylori testing.                           The examined duodenum was normal. Complications:            No immediate complications. Estimated blood  loss:                            Minimal. Estimated Blood Loss:     Estimated blood loss was minimal. Impression:               - Esophagogastric landmarks identified.                           - Normal esophagus otherwise. Empiric dilation                            performed using 16 and 42mm Savory - appropriate  mucosal wrent noted in the proximal esophagus /                            UES, I suspect patient had subtle stricture in this                            area to cause symptoms.                           - A single gastric polyp. Resected and retrieved.                           - Other small / benign appearing gastric polyps                           - Normal stomach - biopsies taken to rule out H                            pylori                           - Normal examined duodenum. Recommendation:           - Patient has a contact number available for                            emergencies. The signs and symptoms of potential                            delayed complications were discussed with the                            patient. Return to normal activities tomorrow.                            Written discharge instructions were provided to the                            patient.                           - post-dilation diet.                           - Continue present medications.                           - Await pathology results and course post-dilation. [name] Lipps P. Sufyan Meidinger, MD 07/19/2017 2:43:16 PM This report has been signed electronically.

## 2017-07-19 NOTE — Progress Notes (Signed)
Patient complains of mid epigastric pain, 8/10 on pain scale. Will notify Dr.Armbruster

## 2017-07-19 NOTE — Patient Instructions (Signed)
*  handout on stricture given*   YOU HAD AN ENDOSCOPIC PROCEDURE TODAY AT [facility]:   Refer to the procedure report that was given to you for any specific questions about what was found during the examination.  If the procedure report does not answer your questions, please call your gastroenterologist to clarify.  If you requested that your care partner not be given the details of your procedure findings, then the procedure report has been included in a sealed envelope for you to review at your convenience later.  YOU SHOULD EXPECT: Some feelings of bloating in the abdomen. Passage of more gas than usual.  Walking can help get rid of the air that was put into your GI tract during the procedure and reduce the bloating. If you had a lower endoscopy (such as a colonoscopy or flexible sigmoidoscopy) you may notice spotting of blood in your stool or on the toilet paper. If you underwent a bowel prep for your procedure, you may not have a normal bowel movement for a few days.  Please Note:  You might notice some irritation and congestion in your nose or some drainage.  This is from the oxygen used during your procedure.  There is no need for concern and it should clear up in a day or so.  SYMPTOMS TO REPORT IMMEDIATELY:   Following upper endoscopy (EGD)  Vomiting of blood or coffee ground material  New chest pain or pain under the shoulder blades  Painful or persistently difficult swallowing  New shortness of breath  Fever of 100F or higher  Black, tarry-looking stools  For urgent or emergent issues, a gastroenterologist can be reached at any hour by calling [phone].   DIET:  Nothing by mouth until 3:45, then clear liquids for one hour, soft food for the rest of the day. Tommorrow  you may proceed to your regular diet.  Drink plenty of fluids but you should avoid alcoholic beverages for 24 hours.  ACTIVITY:  You should plan to take it easy for the rest of today and you  should NOT DRIVE or use heavy machinery until tomorrow (because of the sedation medicines used during the test).    FOLLOW UP: Our staff will call the number listed on your records the next business day following your procedure to check on you and address any questions or concerns that you may have regarding the information given to you following your procedure. If we do not reach you, we will leave a message.  However, if you are feeling well and you are not experiencing any problems, there is no need to return our call.  We will assume that you have returned to your regular daily activities without incident.  If any biopsies were taken you will be contacted by phone or by letter within the next 1-3 weeks.  Please call us at [phone] if you have not heard about the biopsies in 3 weeks.    SIGNATURES/CONFIDENTIALITY: You and/or your care partner have signed paperwork which will be entered into your electronic medical record.  These signatures attest to the fact that that the information above on your After Visit Summary has been reviewed and is understood.  Full responsibility of the confidentiality of this discharge information lies with you and/or your care-partner.

## 2017-07-19 NOTE — Progress Notes (Signed)
Called to room to assist during endoscopic procedure.  Patient ID and intended procedure confirmed with present staff. Received instructions for my participation in the procedure from the performing physician.  

## 2017-07-19 NOTE — Progress Notes (Signed)
History reviewed today 

## 2017-07-19 NOTE — Progress Notes (Signed)
Report given to PACU, vss 

## 2017-07-19 NOTE — Progress Notes (Signed)
Per Dr. Havery Moros Levsin 0.25 given to patient. Patient is now a3/10 on pain scale and has belched a few times. States she is feeling much better. Ok to discharge her per Dr. Havery Moros.

## 2017-07-20 ENCOUNTER — Telehealth: Payer: Self-pay

## 2017-07-20 NOTE — Telephone Encounter (Signed)
  Follow up Call-  Call [name] [name] number 07/19/2017  Post procedure Call [name] [name] phone  # [phone]  Permission to leave phone message Yes  Some recent data might be hidden     Patient questions:  Do you have a fever, pain , or abdominal swelling? No. Pain Score  0 *  Have you tolerated food without any problems? Yes.    Have you been able to return to your normal activities? Yes.    Do you have any questions about your discharge instructions: Diet   No. Medications  No. Follow up visit  No.  Do you have questions or concerns about your Care? No.  Actions: * If pain score is 4 or above: No action needed, pain <4.

## 2017-08-12 ENCOUNTER — Ambulatory Visit: Payer: Medicare Other | Admitting: Gastroenterology

## 2017-08-13 ENCOUNTER — Other Ambulatory Visit: Payer: Self-pay | Admitting: Family Medicine

## 2017-08-13 NOTE — Telephone Encounter (Signed)
Last OV 06/15/17 Alprazolam last filled 04/13/17 #90 with 0

## 2017-08-23 DIAGNOSIS — E1142 Type 2 diabetes mellitus with diabetic polyneuropathy: Secondary | ICD-10-CM | POA: Diagnosis not present

## 2017-08-23 DIAGNOSIS — E049 Nontoxic goiter, unspecified: Secondary | ICD-10-CM | POA: Diagnosis not present

## 2017-08-23 DIAGNOSIS — E7800 Pure hypercholesterolemia, unspecified: Secondary | ICD-10-CM | POA: Diagnosis not present

## 2017-08-23 DIAGNOSIS — E039 Hypothyroidism, unspecified: Secondary | ICD-10-CM | POA: Diagnosis not present

## 2017-08-23 DIAGNOSIS — N189 Chronic kidney disease, unspecified: Secondary | ICD-10-CM | POA: Diagnosis not present

## 2017-08-23 DIAGNOSIS — G609 Hereditary and idiopathic neuropathy, unspecified: Secondary | ICD-10-CM | POA: Diagnosis not present

## 2017-08-23 LAB — HEMOGLOBIN A1C: Hemoglobin A1C: 7.1

## 2017-08-23 LAB — HM DIABETES FOOT EXAM

## 2017-08-24 ENCOUNTER — Encounter: Payer: Self-pay | Admitting: General Practice

## 2017-08-27 ENCOUNTER — Encounter: Payer: Medicare Other | Admitting: Gastroenterology

## 2017-09-13 ENCOUNTER — Other Ambulatory Visit: Payer: Self-pay | Admitting: Physician Assistant

## 2017-09-13 ENCOUNTER — Other Ambulatory Visit: Payer: Self-pay | Admitting: Neurology

## 2017-09-13 NOTE — Telephone Encounter (Signed)
Last OV 06/15/17, Next OV 11/03/17  Last filled 08/13/17, # 30 with 0 refills

## 2017-09-27 NOTE — Progress Notes (Signed)
[facility]  PATIENT: TRINISHA PAGET DOB: 1946/01/08   REASON FOR VISIT: Follow-up for trigeminal neuralgia, headache HISTORY FROM: Patient    HISTORY OF PRESENT ILLNESS:UPDATE 9/10/2019CM Ms.Alsop, 72 year old female returns for follow-up with a history of cervical spondylosis and cervicogenic headache and trigeminal neuralgia.  She had surgery in March spinal  fusion C5 and C6.  Her cervical headaches are much improved and she wants to come off of the propanolol. She also has trigeminal neuralgia which is well controlled on a combination of amitriptyline and gabapentin.  She has tried to taper amitriptyline in the past unsuccessfully.  She denies any recent falls.  She denies any bouts of vertigo.  She continues to be quite active.  She returns for reevaluation  09/28/16 CMMs. Mustin is a 72 year old right-handed white female with a history of cervical spondylosis and cervicogenic headache and  trigeminal neuralgia. The patient  does fairly well with a combination of propranolol and amitriptyline and gabapentin for her headaches and facial pain. The patient takes only 10 mg twice daily of the propranolol, she takes 25 mg at night of the amitriptyline. The patient has tried to cut back on amitriptyline because she has dry eyes , but her symptoms severely worsened, and she cannot tolerate the dose decrease. She is also on gabapentin 300 mg 1 in the morning and 2 every evening The patient returns to this office for further evaluation. She has noted some gait instability at times.No recent falls.Has  Bouts of vertigo that come and go. She returns for reevaluation  REVIEW OF SYSTEMS: Full 14 system review of systems performed and notable only for those listed, all others are neg:  Constitutional: neg  Cardiovascular: neg Ear/Nose/Throat: neg Skin: neg Eyes: neg Respiratory: neg Gastroitestinal: neg  Hematology/Lymphatic: neg  Endocrine: neg Musculoskeletal:  neg Allergy/Immunology: neg Neurological: Facial pain Psychiatric:neg Sleep : neg   ALLERGIES: Allergies  Allergen Reactions  . Rocephin [Ceftriaxone] Anaphylaxis  . Prednisone Other (See Comments)    High blood sugars (pt reports)  . Other     VERY DELICATE SKIN FROM MELANOMA   . Benadryl [Diphenhydramine Hcl] Anxiety    Hyperactivity    HOME MEDICATIONS: Outpatient Medications Prior to Visit  Medication Sig Dispense Refill  . ALPRAZolam (XANAX) 0.5 MG tablet TAKE 1 TABLET(0.5 MG) BY MOUTH AT BEDTIME AS NEEDED FOR SLEEP 30 tablet 3  . amitriptyline (ELAVIL) 25 MG tablet Take 1 tablet (25 mg total) by mouth at bedtime. 90 tablet 3  . Cholecalciferol (VITAMIN D3) 2000 UNITS TABS Take 1 tablet by mouth daily.     . diclofenac sodium (VOLTAREN) 1 % GEL Apply topically 4 (four) times daily.    [name] gabapentin (NEURONTIN) 300 MG capsule TAKE 1 CAPSULE BY MOUTH EVERY MORNING AND 2 CAPSULES BY MOUTH EVERY EVENING 270 capsule 1  . hyoscyamine (LEVSIN SL) 0.125 MG SL tablet Place 1 tablet (0.125 mg total) under the tongue every 6 (six) hours as needed for cramping. 30 tablet 1  . levothyroxine (SYNTHROID, LEVOTHROID) 50 MCG tablet TK 1 T PO QD  12  . Multiple Vitamin (MULTIVITAMIN) tablet Take 1 tablet by mouth daily.      [name] omeprazole (PRILOSEC) 20 MG capsule Take 1 capsule (20 mg total) by mouth 2 (two) times daily before a meal. 60 capsule 2  . Probiotic Product (PROBIOTIC PO) Take 1 tablet by mouth daily.    . propranolol (INDERAL) 10 MG tablet TAKE 1 TABLET BY MOUTH EVERY MORNING AND 1  TABLET BY MOUTH AT NIGHT 180 tablet 0  . rosuvastatin (CRESTOR) 10 MG tablet TAKE 1 TABLET(10 MG) BY MOUTH DAILY 90 tablet 1  . sucralfate (CARAFATE) 1 g tablet Take 1 tablet (1 g total) by mouth 4 (four) times daily -  with meals and at bedtime. 30 tablet 3  . Lifitegrast (XIIDRA) 5 % SOLN Apply to eye.     Facility-Administered Medications Prior to Visit  Medication Dose Route Frequency Provider Last  Rate Last Dose  . 0.9 %  sodium chloride infusion  500 mL Intravenous Once Armbruster, Carlota Raspberry, MD        PAST MEDICAL HISTORY: Past Medical History:  Diagnosis Date  . Abdominal pain    EGD and CTs neg, rx Amitriptiline (non ulcer dyspepsia) EGD 12/11.. s/p ballon dilitation  . Anxiety   . Cervical spondylosis without myelopathy 11/30/2012  . Chronic conjunctivitis   . Diabetes mellitus    no meds, diet controlled  . Diverticulosis of colon   . GERD with stricture    s/p dilatations  . History of Lyme disease   . HOH (hard of hearing)    bilateral hearing aids  . Hyperlipidemia   . Hypothyroidism     Dx 10-9 TSH 5.2 , then TSH normalized, eventually went up again 2012  . Melanoma ([facility])    h/o  . Memory difficulties 06/05/2014  . Osteopenia   . Solar lentigo    face; s/p excision 3/09. See Derm q 6 months  . Trigeminal neuralgia    h/o     PAST SURGICAL HISTORY: Past Surgical History:  Procedure Laterality Date  . ABDOMINAL HYSTERECTOMY    . ANKLE SURGERY  07/01/2015   R andkle tendon surgery  . BREAST BIOPSY Left 1988  . BREAST EXCISIONAL BIOPSY Left 1988  . CERVICAL FUSION  06/2017  . CHOLECYSTECTOMY    . COLON RESECTION  1999   d/t diverticulitis  . COLON SURGERY    . melanoma resection Right     Right Arm Surgery, R facial surgery (05-2012)   . PTX s/t chest tube    . SHOULDER SURGERY     right  . WRIST SURGERY     removed melaoma, right    FAMILY HISTORY: Family History  Problem Relation Age of Onset  . Prostate cancer Father   . Endometrial cancer Sister        clear cell carcinoma  . Stroke Sister   . Heart attack Paternal Grandfather        GF in his 69s  . Melanoma Maternal Uncle   . Colon cancer Neg Hx   . Breast cancer Neg Hx   . Diabetes Neg Hx   . Stomach cancer Neg Hx   . Rectal cancer Neg Hx     SOCIAL HISTORY: Social History   Socioeconomic History  . Marital status: Married    Spouse name: Percell Miller  . Number of children: 2   . Years of education: college  . Highest education level: Not on file  Occupational History  . Occupation: retired    Fish farm manager: RETIRED  Social Needs  . Financial resource strain: Not on file  . Food insecurity:    Worry: Not on file    Inability: Not on file  . Transportation needs:    Medical: Not on file    Non-medical: Not on file  Tobacco Use  . Smoking status: Former Smoker    Start date: 01/20/1976    Last attempt  to quit: 1981    Years since quitting: 38.7  . Smokeless tobacco: Never Used  Substance and Sexual Activity  . Alcohol use: Yes    Alcohol/week: 2.0 - 3.0 standard drinks    Types: 2 - 3 Glasses of wine per week  . Drug use: No  . Sexual activity: Not on file  Lifestyle  . Physical activity:    Days per week: Not on file    Minutes per session: Not on file  . Stress: Not on file  Relationships  . Social connections:    Talks on phone: Not on file    Gets together: Not on file    Attends religious service: Not on file    Active member of club or organization: Not on file    Attends meetings of clubs or organizations: Not on file    Relationship status: Not on file  . Intimate partner violence:    Fear of current or ex partner: Not on file    Emotionally abused: Not on file    Physically abused: Not on file    Forced sexual activity: Not on file  Other Topics Concern  . Not on file  Social History Narrative   Patient is married with 2 children.   Patient is right handed.   Patient has college education.   Patient does not drink caffeine.            PHYSICAL EXAM  Vitals:   09/28/17 1258  BP: 108/64  Pulse: 64  Weight: 145 lb 3.2 oz (65.9 kg)  Height: 5\' 4"  (1.626 m)   Body mass index is 24.92 kg/m.  Generalized: Well developed, in no acute distress  Head: normocephalic and atraumatic,. Oropharynx benign  Musculoskeletal: No deformity  Neurological examination   Mentation: Alert oriented to time, place, history taking. Attention  span and concentration appropriate. Recent and remote memory intact.  Follows all commands speech and language fluent.   Cranial nerve II-XII: Pupils were equal round reactive to light extraocular movements were full, visual field were full on confrontational test. Facial sensation and strength were normal. hearing was intact to finger rubbing bilaterally. Uvula tongue midline. head turning and shoulder shrug were normal and symmetric.Tongue protrusion into cheek strength was normal. Motor: normal bulk and tone, full strength in the BUE, BLE, fine finger movements normal, Sensory: normal and symmetric to light touch,   Coordination: finger-nose-finger, heel-to-shin bilaterally, no dysmetria Reflexes: Symmetric upper and lower plantar responses were flexor bilaterally. Gait and Station: Rising up from seated position without assistance, normal stance,  moderate stride, good arm swing, smooth turning, , unsteady with tandem   DIAGNOSTIC DATA (LABS, IMAGING, TESTING) - I reviewed patient records, labs, notes, testing and imaging myself where available.  Lab Results  Component Value Date   WBC 6.4 05/17/2017   HGB 13.5 05/17/2017   HCT 40.0 05/17/2017   MCV 91.5 05/17/2017   PLT 224.0 05/17/2017      Component Value Date/Time   NA 140 05/17/2017 1413   NA 143 03/12/2014   NA 143 03/12/2014   K 4.4 05/17/2017 1413   CL 103 05/17/2017 1413   CO2 28 05/17/2017 1413   GLUCOSE 106 (H) 05/17/2017 1413   GLUCOSE 126 (H) 11/11/2005 0831   BUN 23 05/17/2017 1413   BUN 27 (A) 03/12/2014   CREATININE 0.73 05/17/2017 1413   CALCIUM 9.9 05/17/2017 1413   PROT 6.7 06/15/2017 1452   ALBUMIN 4.2 06/15/2017 1452   AST 16 06/15/2017  1452   ALT 15 06/15/2017 1452   ALKPHOS 72 06/15/2017 1452   BILITOT 0.4 06/15/2017 1452   GFRNONAA >60 09/01/2016 2300   GFRAA >60 09/01/2016 2300   Lab Results  Component Value Date   CHOL 163 04/13/2017   HDL 59.00 04/13/2017   LDLCALC 74 04/13/2017    LDLDIRECT 148.1 06/11/2006   TRIG 153.0 (H) 04/13/2017   CHOLHDL 3 04/13/2017   Lab Results  Component Value Date   HGBA1C 7.4 02/23/2017    Lab Results  Component Value Date   TSH 3.81 10/07/2015      ASSESSMENT AND PLAN  72 y.o. year old female  has a past medical history of ; Cervical spondylosis without myelopathy (11/30/2012); Trigeminal neuralgia. Mild gait instability.  She had cervical fusion C5-C6 in March with much improvement in her cervical headaches.  PLAN: Continue gabapentin at current dose will refill May taper and discontinue inderal , stop night time dose  For 1 week then completely discontinue. Continue Elavil at current dose will refill Follow-up yearly and when necessary Dennie Bible, [facility], [facility], APRN  [facility] [address] [place], [place] [address] [phone]

## 2017-09-28 ENCOUNTER — Ambulatory Visit (INDEPENDENT_AMBULATORY_CARE_PROVIDER_SITE_OTHER): Payer: Medicare Other | Admitting: Nurse Practitioner

## 2017-09-28 ENCOUNTER — Encounter: Payer: Self-pay | Admitting: Nurse Practitioner

## 2017-09-28 VITALS — BP 108/64 | HR 64 | Ht 64.0 in | Wt 145.2 lb

## 2017-09-28 DIAGNOSIS — G44099 Other trigeminal autonomic cephalgias (TAC), not intractable: Secondary | ICD-10-CM | POA: Diagnosis not present

## 2017-09-28 DIAGNOSIS — G500 Trigeminal neuralgia: Secondary | ICD-10-CM | POA: Diagnosis not present

## 2017-09-28 MED ORDER — GABAPENTIN 300 MG PO CAPS: ORAL_CAPSULE | ORAL | 3 refills | Status: DC

## 2017-09-28 MED ORDER — AMITRIPTYLINE HCL 25 MG PO TABS: 25.0000 mg | ORAL_TABLET | Freq: Every day | ORAL | 3 refills | Status: DC

## 2017-09-28 NOTE — Progress Notes (Signed)
I have read the note, and I agree with the clinical assessment and plan.  [name] [name]   

## 2017-09-28 NOTE — Patient Instructions (Signed)
Continue gabapentin at current dose will refill May taper and discontinue inderal , stop night time dose  For 1 week then completely discontinue. Continue Elavil at current dose will refill Follow-up yearly and when necessary

## 2017-09-29 DIAGNOSIS — M542 Cervicalgia: Secondary | ICD-10-CM | POA: Diagnosis not present

## 2017-09-29 DIAGNOSIS — M4322 Fusion of spine, cervical region: Secondary | ICD-10-CM | POA: Diagnosis not present

## 2017-10-05 ENCOUNTER — Other Ambulatory Visit: Payer: Self-pay | Admitting: Obstetrics and Gynecology

## 2017-10-05 DIAGNOSIS — Z1231 Encounter for screening mammogram for malignant neoplasm of breast: Secondary | ICD-10-CM

## 2017-10-18 NOTE — Progress Notes (Signed)
Subjective:   [name] [name] is a 72 y.o. female who presents for Medicare Annual (Subsequent) preventive examination.  Review of Systems:  No ROS.  Medicare Wellness Visit. Additional risk factors are reflected in the social history.  Cardiac Risk Factors include: advanced age (>44men, >69 women);diabetes mellitus;dyslipidemia;family history of premature cardiovascular disease   Sleep patterns: Home Safety/Smoke Alarms: Feels safe in home. Smoke alarms in place.  Living environment; residence and Firearm Safety: Lives with husband in 1 story home.  Seat Belt Safety/Bike Helmet: Wears seat belt.   Female:   Pap-N/A    Mammo-10/23/2016, negative. Scheduled 11/09/17       Dexa scan-12/22/2016, Osteopenia. Dr. Helane Rima   CCS-Colonoscopy 03/01/2015, polyp. Recall 10 years.       Objective:     Vitals: BP 132/70 (BP Location: Left Arm, Patient Position: Sitting, Cuff Size: Normal)   Pulse 88   Ht 5\' 4"  (1.626 m)   Wt 144 lb 6 oz (65.5 kg)   SpO2 98%   BMI 24.78 kg/m   Body mass index is 24.78 kg/m.  Advanced Directives 10/19/2017 07/19/2017 10/14/2016 09/01/2016 10/16/2015 12/06/2014 06/05/2014  Does Patient Have a Medical Advance Directive? Yes Yes Yes No Yes Yes Yes  Type of Paramedic of [place];Living will - Living will;Healthcare Power of [place];Living will [place];Living will [place];Living will  Copy of [place] in Chart? Yes - No - copy requested - - - -  Would patient like information on creating a medical advance directive? - - - No - Patient declined - - -    Tobacco Social History   Tobacco Use  Smoking Status Former Smoker  . Start date: 01/20/1976  . Last attempt to quit: 1981  . Years since quitting: 38.7  Smokeless Tobacco Never Used     Counseling given: Not Answered    Past Medical History:  Diagnosis Date  . Abdominal pain    EGD and  CTs neg, rx Amitriptiline (non ulcer dyspepsia) EGD 12/11.. s/p ballon dilitation  . Anxiety   . Cervical spondylosis without myelopathy 11/30/2012  . Chronic conjunctivitis   . Diabetes mellitus    no meds, diet controlled  . Diverticulosis of colon   . GERD with stricture    s/p dilatations  . History of Lyme disease   . HOH (hard of hearing)    bilateral hearing aids  . Hyperlipidemia   . Hypothyroidism     Dx 10-9 TSH 5.2 , then TSH normalized, eventually went up again 2012  . Melanoma ([place])    h/o  . Memory difficulties 06/05/2014  . Osteopenia   . Solar lentigo    face; s/p excision 3/09. See Derm q 6 months  . Trigeminal neuralgia    h/o    Past Surgical History:  Procedure Laterality Date  . ABDOMINAL HYSTERECTOMY    . ANKLE SURGERY  07/01/2015   R andkle tendon surgery  . BREAST BIOPSY Left 1988  . BREAST EXCISIONAL BIOPSY Left 1988  . CERVICAL FUSION  06/2017  . CHOLECYSTECTOMY    . COLON RESECTION  1999   d/t diverticulitis  . COLON SURGERY    . melanoma resection Right     Right Arm Surgery, R facial surgery (05-2012)   . PTX s/t chest tube    . SHOULDER SURGERY     right  . WRIST SURGERY     removed melaoma, right  Family History  Problem Relation Age of Onset  . Prostate cancer Father   . Endometrial cancer Sister        clear cell carcinoma  . Stroke Sister   . Heart attack Paternal Grandfather        GF in his 55s  . Melanoma Maternal Uncle   . Colon cancer Neg Hx   . Breast cancer Neg Hx   . Diabetes Neg Hx   . Stomach cancer Neg Hx   . Rectal cancer Neg Hx    Social History   Socioeconomic History  . Marital status: Married    Spouse name: Percell Miller  . Number of children: 2  . Years of education: college  . Highest education level: Not on file  Occupational History  . Occupation: retired    Fish farm manager: RETIRED  Social Needs  . Financial resource strain: Not on file  . Food insecurity:    Worry: Not on file    Inability: Not on  file  . Transportation needs:    Medical: Not on file    Non-medical: Not on file  Tobacco Use  . Smoking status: Former Smoker    Start date: 01/20/1976    Last attempt to quit: 1981    Years since quitting: 38.7  . Smokeless tobacco: Never Used  Substance and Sexual Activity  . Alcohol use: Yes    Alcohol/week: 2.0 - 3.0 standard drinks    Types: 2 - 3 Glasses of wine per week  . Drug use: No  . Sexual activity: Not on file  Lifestyle  . Physical activity:    Days per week: Not on file    Minutes per session: Not on file  . Stress: Not on file  Relationships  . Social connections:    Talks on phone: Not on file    Gets together: Not on file    Attends religious service: Not on file    Active member of club or organization: Not on file    Attends meetings of clubs or organizations: Not on file    Relationship status: Not on file  Other Topics Concern  . Not on file  Social History Narrative   Patient is married with 2 children.   Patient is right handed.   Patient has college education.   Patient does not drink caffeine.           Outpatient Encounter Medications as of 10/19/2017  Medication Sig  . ALPRAZolam (XANAX) 0.5 MG tablet TAKE 1 TABLET(0.5 MG) BY MOUTH AT BEDTIME AS NEEDED FOR SLEEP  . amitriptyline (ELAVIL) 25 MG tablet Take 1 tablet (25 mg total) by mouth at bedtime.  . Cholecalciferol (VITAMIN D3) 2000 UNITS TABS Take 1 tablet by mouth daily.   . diclofenac sodium (VOLTAREN) 1 % GEL Apply topically 4 (four) times daily.  [name] gabapentin (NEURONTIN) 300 MG capsule TAKE 1 CAPSULE BY MOUTH EVERY MORNING AND 2 CAPSULES BY MOUTH EVERY EVENING  . hyoscyamine (LEVSIN SL) 0.125 MG SL tablet Place 1 tablet (0.125 mg total) under the tongue every 6 (six) hours as needed for cramping.  [name] levothyroxine (SYNTHROID, LEVOTHROID) 50 MCG tablet TK 1 T PO QD  . Lifitegrast (XIIDRA) 5 % SOLN Apply to eye.  . Multiple Vitamin (MULTIVITAMIN) tablet Take 1 tablet by mouth daily.      [name] omeprazole (PRILOSEC) 20 MG capsule Take 1 capsule (20 mg total) by mouth 2 (two) times daily before a meal.  . ONETOUCH VERIO test  strip 2 (two) times daily. for testing  . Probiotic Product (PROBIOTIC PO) Take 1 tablet by mouth daily.  . repaglinide (PRANDIN) 0.5 MG tablet Take 0.5 mg by mouth daily.  . rosuvastatin (CRESTOR) 10 MG tablet TAKE 1 TABLET(10 MG) BY MOUTH DAILY  . sucralfate (CARAFATE) 1 g tablet Take 1 tablet (1 g total) by mouth 4 (four) times daily -  with meals and at bedtime.   Facility-Administered Encounter Medications as of 10/19/2017  Medication  . 0.9 %  sodium chloride infusion    Activities of Daily Living In your present state of health, do you have any difficulty performing the following activities: 10/19/2017 04/13/2017  Hearing? N N  Vision? N N  Difficulty concentrating or making decisions? N N  Walking or climbing stairs? N N  Dressing or bathing? N N  Doing errands, shopping? N N  Preparing Food and eating ? N -  Using the Toilet? N -  In the past six months, have you accidently leaked urine? N -  Do you have problems with loss of bowel control? N -  Managing your Medications? N -  Managing your Finances? N -  Housekeeping or managing your Housekeeping? N -  Some recent data might be hidden    Patient Care Team: Midge Minium, MD as PCP - General (Family Medicine) Zehr, Laban Emperor, PA-C as Physician Assistant (Gastroenterology) Jacelyn Pi, MD as Consulting Physician (Endocrinology) Kathrynn Ducking, MD as Consulting Physician (Neurology) Georgeann Oppenheim, MD as Consulting Physician (Ophthalmology) Rosemary Holms, DPM as Consulting Physician (Podiatry) Dian Queen, MD as Consulting Physician (Obstetrics and Gynecology) Jarome Matin, MD as Consulting Physician (Dermatology) Armbruster, Carlota Raspberry, MD as Consulting Physician (Gastroenterology)    Assessment:   This is a routine wellness examination for [name] [name].  Exercise Activities  and Dietary recommendations Current Exercise Habits: Structured exercise class, Type of exercise: Other - see comments(water aerobics; high intensity weights (water); water zumba), Frequency (Times/Week): 4, Exercise limited by: None identified   Diet (meal preparation, eat out, water intake, caffeinated beverages, dairy products, fruits and vegetables): Drinks water (5 bottles).   Breakfast: ziggy's yogurt, fruit Lunch: turkey/chz sandwich, fruit Snack: Almonds Dinner: Vegetable, lean protein  Goals      Patient Stated   . patient states (pt-stated)     Maintain blood sugars.       Other   . Patient Stated     Rich Brave off of several medications d/t recent neck surgery.        Fall Risk Fall Risk  10/19/2017 04/13/2017 12/16/2016 10/14/2016 09/28/2016  Falls in the past year? No No No Yes No  Number falls in past yr: - - - 2 or more -  Risk for fall due to : - - - Impaired balance/gait;Medication side effect -     Depression Screen PHQ 2/9 Scores 10/19/2017 04/13/2017 12/16/2016 10/14/2016  PHQ - 2 Score 0 0 0 0  PHQ- 9 Score - 0 0 -  Exception Documentation - - - -     Cognitive Function MMSE - Mini Mental State Exam 10/19/2017  Orientation to time 5  Orientation to Place 5  Registration 3  Attention/ Calculation 5  Recall 3  Language- name 2 objects 2  Language- repeat 1  Language- follow 3 step command 3  Language- read & follow direction 1  Write a sentence 1  Copy design 1  Total score 30        Immunization History  Administered Date(s) Administered  .  Influenza Whole 10/26/2011, 09/19/2012  . Influenza, High Dose Seasonal PF 01/23/2015, 10/14/2016, 10/19/2017  . Influenza,inj,Quad PF,6+ Mos 01/15/2014, 10/03/2015  . Pneumococcal Conjugate-13 04/06/2014  . Pneumococcal Polysaccharide-23 03/18/2011  . Td 12/21/2003  . Tdap 04/06/2014  . Zoster 11/03/2011  . Zoster Recombinat (Shingrix) 11/19/2016, 01/20/2017    Screening Tests Health Maintenance    Topic Date Due  . INFLUENZA VACCINE  08/19/2017  . HEMOGLOBIN A1C  08/23/2017  . URINE MICROALBUMIN  10/09/2017  . DEXA SCAN  12/16/2017 (Originally 06/19/2013)  . Hepatitis C Screening  10/20/2018 (Originally 09/25/1945)  . OPHTHALMOLOGY EXAM  01/20/2018  . FOOT EXAM  08/24/2018  . MAMMOGRAM  10/24/2018  . TETANUS/TDAP  04/05/2024  . COLONOSCOPY  02/28/2025  . PNA vac Low Risk Adult  Completed        Plan:    Continue doing brain stimulating activities (puzzles, reading, adult coloring books, staying active) to keep memory sharp.   I have personally reviewed and noted the following in the patient's chart:   . Medical and social history . Use of alcohol, tobacco or illicit drugs  . Current medications and supplements . Functional ability and status . Nutritional status . Physical activity . Advanced directives . List of other physicians . Hospitalizations, surgeries, and ER visits in previous 12 months . Vitals . Screenings to include cognitive, depression, and falls . Referrals and appointments  In addition, I have reviewed and discussed with patient certain preventive protocols, quality metrics, and best practice recommendations. A written personalized care plan for preventive services as well as general preventive health recommendations were provided to patient.     Gerilyn Nestle, RN  10/19/2017   PCP Notes: -F/U with PCP 11/03/17, no issues.

## 2017-10-19 ENCOUNTER — Other Ambulatory Visit: Payer: Self-pay

## 2017-10-19 ENCOUNTER — Ambulatory Visit (INDEPENDENT_AMBULATORY_CARE_PROVIDER_SITE_OTHER): Payer: Medicare Other

## 2017-10-19 DIAGNOSIS — Z23 Encounter for immunization: Secondary | ICD-10-CM | POA: Diagnosis not present

## 2017-10-19 DIAGNOSIS — Z0000 Encounter for general adult medical examination without abnormal findings: Secondary | ICD-10-CM | POA: Diagnosis not present

## 2017-10-19 NOTE — Patient Instructions (Addendum)
Continue doing brain stimulating activities (puzzles, reading, adult coloring books, staying active) to keep memory sharp.   Health Maintenance, Female Adopting a healthy lifestyle and getting preventive care can go a long way to promote health and wellness. Talk with your health care provider about what schedule of regular examinations is right for you. This is a good chance for you to check in with your provider about disease prevention and staying healthy. In between checkups, there are plenty of things you can do on your own. Experts have done a lot of research about which lifestyle changes and preventive measures are most likely to keep you healthy. Ask your health care provider for more information. Weight and diet Eat a healthy diet  Be sure to include plenty of vegetables, fruits, low-fat dairy products, and lean protein.  Do not eat a lot of foods high in solid fats, added sugars, or salt.  Get regular exercise. This is one of the most important things you can do for your health. ? Most adults should exercise for at least 150 minutes each week. The exercise should increase your heart rate and make you sweat (moderate-intensity exercise). ? Most adults should also do strengthening exercises at least twice a week. This is in addition to the moderate-intensity exercise.  Maintain a healthy weight  Body mass index (BMI) is a measurement that can be used to identify possible weight problems. It estimates body fat based on height and weight. Your health care provider can help determine your BMI and help you achieve or maintain a healthy weight.  For females 67 years of age and older: ? A BMI below 18.5 is considered underweight. ? A BMI of 18.5 to 24.9 is normal. ? A BMI of 25 to 29.9 is considered overweight. ? A BMI of 30 and above is considered obese.  Watch levels of cholesterol and blood lipids  You should start having your blood tested for lipids and cholesterol at 72 years of  age, then have this test every 5 years.  You may need to have your cholesterol levels checked more often if: ? Your lipid or cholesterol levels are high. ? You are older than 72 years of age. ? You are at high risk for heart disease.  Cancer screening Lung Cancer  Lung cancer screening is recommended for adults 6-47 years old who are at high risk for lung cancer because of a history of smoking.  A yearly low-dose CT scan of the lungs is recommended for people who: ? Currently smoke. ? Have quit within the past 15 years. ? Have at least a 30-pack-year history of smoking. A pack year is smoking an average of one pack of cigarettes a day for 1 year.  Yearly screening should continue until it has been 15 years since you quit.  Yearly screening should stop if you develop a health problem that would prevent you from having lung cancer treatment.  Breast Cancer  Practice breast self-awareness. This means understanding how your breasts normally appear and feel.  It also means doing regular breast self-exams. Let your health care provider know about any changes, no matter how small.  If you are in your 20s or 30s, you should have a clinical breast exam (CBE) by a health care provider every 1-3 years as part of a regular health exam.  If you are 21 or older, have a CBE every year. Also consider having a breast X-ray (mammogram) every year.  If you have a family history of  of breast cancer, talk to your health care provider about genetic screening.  If you are at high risk for breast cancer, talk to your health care provider about having an MRI and a mammogram every year.  Breast cancer gene (BRCA) assessment is recommended for women who have family members with BRCA-related cancers. BRCA-related cancers include: ? Breast. ? Ovarian. ? Tubal. ? Peritoneal cancers.  Results of the assessment will determine the need for genetic counseling and BRCA1 and BRCA2 testing.  Cervical  Cancer Your health care provider may recommend that you be screened regularly for cancer of the pelvic organs (ovaries, uterus, and vagina). This screening involves a pelvic examination, including checking for microscopic changes to the surface of your cervix (Pap test). You may be encouraged to have this screening done every 3 years, beginning at age 21.  For women ages 30-65, health care providers may recommend pelvic exams and Pap testing every 3 years, or they may recommend the Pap and pelvic exam, combined with testing for human papilloma virus (HPV), every 5 years. Some types of HPV increase your risk of cervical cancer. Testing for HPV may also be done on women of any age with unclear Pap test results.  Other health care providers may not recommend any screening for nonpregnant women who are considered low risk for pelvic cancer and who do not have symptoms. Ask your health care provider if a screening pelvic exam is right for you.  If you have had past treatment for cervical cancer or a condition that could lead to cancer, you need Pap tests and screening for cancer for at least 20 years after your treatment. If Pap tests have been discontinued, your risk factors (such as having a new sexual partner) need to be reassessed to determine if screening should resume. Some women have medical problems that increase the chance of getting cervical cancer. In these cases, your health care provider may recommend more frequent screening and Pap tests.  Colorectal Cancer  This type of cancer can be detected and often prevented.  Routine colorectal cancer screening usually begins at 72 years of age and continues through 72 years of age.  Your health care provider may recommend screening at an earlier age if you have risk factors for colon cancer.  Your health care provider may also recommend using home test kits to check for hidden blood in the stool.  A small camera at the end of a tube can be used to  examine your colon directly (sigmoidoscopy or colonoscopy). This is done to check for the earliest forms of colorectal cancer.  Routine screening usually begins at age 50.  Direct examination of the colon should be repeated every 5-10 years through 72 years of age. However, you may need to be screened more often if early forms of precancerous polyps or small growths are found.  Skin Cancer  Check your skin from head to toe regularly.  Tell your health care provider about any new moles or changes in moles, especially if there is a change in a mole's shape or color.  Also tell your health care provider if you have a mole that is larger than the size of a pencil eraser.  Always use sunscreen. Apply sunscreen liberally and repeatedly throughout the day.  Protect yourself by wearing long sleeves, pants, a wide-brimmed hat, and sunglasses whenever you are outside.  Heart disease, diabetes, and high blood pressure  High blood pressure causes heart disease and increases the risk of stroke. High   blood pressure is more likely to develop in: ? People who have blood pressure in the high end of the normal range (130-139/85-89 mm Hg). ? People who are overweight or obese. ? People who are African American.  If you are 18-39 years of age, have your blood pressure checked every 3-5 years. If you are 40 years of age or older, have your blood pressure checked every year. You should have your blood pressure measured twice-once when you are at a hospital or clinic, and once when you are not at a hospital or clinic. Record the average of the two measurements. To check your blood pressure when you are not at a hospital or clinic, you can use: ? An automated blood pressure machine at a pharmacy. ? A home blood pressure monitor.  If you are between 55 years and 79 years old, ask your health care provider if you should take aspirin to prevent strokes.  Have regular diabetes screenings. This involves taking a  blood sample to check your fasting blood sugar level. ? If you are at a normal weight and have a low risk for diabetes, have this test once every three years after 72 years of age. ? If you are overweight and have a high risk for diabetes, consider being tested at a younger age or more often. Preventing infection Hepatitis B  If you have a higher risk for hepatitis B, you should be screened for this virus. You are considered at high risk for hepatitis B if: ? You were born in a country where hepatitis B is common. Ask your health care provider which countries are considered high risk. ? Your parents were born in a high-risk country, and you have not been immunized against hepatitis B (hepatitis B vaccine). ? You have HIV or AIDS. ? You use needles to inject street drugs. ? You live with someone who has hepatitis B. ? You have had sex with someone who has hepatitis B. ? You get hemodialysis treatment. ? You take certain medicines for conditions, including cancer, organ transplantation, and autoimmune conditions.  Hepatitis C  Blood testing is recommended for: ? Everyone born from 1945 through 1965. ? Anyone with known risk factors for hepatitis C.  Sexually transmitted infections (STIs)  You should be screened for sexually transmitted infections (STIs) including gonorrhea and chlamydia if: ? You are sexually active and are younger than 72 years of age. ? You are older than 72 years of age and your health care provider tells you that you are at risk for this type of infection. ? Your sexual activity has changed since you were last screened and you are at an increased risk for chlamydia or gonorrhea. Ask your health care provider if you are at risk.  If you do not have HIV, but are at risk, it may be recommended that you take a prescription medicine daily to prevent HIV infection. This is called pre-exposure prophylaxis (PrEP). You are considered at risk if: ? You are sexually active and  do not regularly use condoms or know the HIV status of your partner(s). ? You take drugs by injection. ? You are sexually active with a partner who has HIV.  Talk with your health care provider about whether you are at high risk of being infected with HIV. If you choose to begin PrEP, you should first be tested for HIV. You should then be tested every 3 months for as long as you are taking PrEP. Pregnancy  If you are   and you may become pregnant, ask your health care provider about preconception counseling.  If you may become pregnant, take 400 to 800 micrograms (mcg) of folic acid every day.  If you want to prevent pregnancy, talk to your health care provider about birth control (contraception). Osteoporosis and menopause  Osteoporosis is a disease in which the bones lose minerals and strength with aging. This can result in serious bone fractures. Your risk for osteoporosis can be identified using a bone density scan.  If you are 76 years of age or older, or if you are at risk for osteoporosis and fractures, ask your health care provider if you should be screened.  Ask your health care provider whether you should take a calcium or vitamin D supplement to lower your risk for osteoporosis.  Menopause may have certain physical symptoms and risks.  Hormone replacement therapy may reduce some of these symptoms and risks. Talk to your health care provider about whether hormone replacement therapy is right for you. Follow these instructions at home:  Schedule regular health, dental, and eye exams.  Stay current with your immunizations.  Do not use any tobacco products including cigarettes, chewing tobacco, or electronic cigarettes.  If you are pregnant, do not drink alcohol.  If you are breastfeeding, limit how much and how often you drink alcohol.  Limit alcohol intake to no more than 1 drink per day for nonpregnant women. One drink equals 12 ounces of beer, 5 ounces of  wine, or 1 ounces of hard liquor.  Do not use street drugs.  Do not share needles.  Ask your health care provider for help if you need support or information about quitting drugs.  Tell your health care provider if you often feel depressed.  Tell your health care provider if you have ever been abused or do not feel safe at home. This information is not intended to replace advice given to you by your health care provider. Make sure you discuss any questions you have with your health care provider. Document Released: 07/21/2010 Document Revised: 06/13/2015 Document Reviewed: 10/09/2014 Elsevier Interactive Patient Education  [employer].

## 2017-10-19 NOTE — Progress Notes (Signed)
RN MWV note reviewed in PCP absence.  Nhan Qualley Cody Katalina Magri, PA-C  

## 2017-10-20 ENCOUNTER — Ambulatory Visit: Payer: Medicare Other

## 2017-11-01 DIAGNOSIS — L812 Freckles: Secondary | ICD-10-CM | POA: Diagnosis not present

## 2017-11-01 DIAGNOSIS — I788 Other diseases of capillaries: Secondary | ICD-10-CM | POA: Diagnosis not present

## 2017-11-01 DIAGNOSIS — D1801 Hemangioma of skin and subcutaneous tissue: Secondary | ICD-10-CM | POA: Diagnosis not present

## 2017-11-01 DIAGNOSIS — Z85820 Personal history of malignant melanoma of skin: Secondary | ICD-10-CM | POA: Diagnosis not present

## 2017-11-01 DIAGNOSIS — L821 Other seborrheic keratosis: Secondary | ICD-10-CM | POA: Diagnosis not present

## 2017-11-01 DIAGNOSIS — D2271 Melanocytic nevi of right lower limb, including hip: Secondary | ICD-10-CM | POA: Diagnosis not present

## 2017-11-01 DIAGNOSIS — D225 Melanocytic nevi of trunk: Secondary | ICD-10-CM | POA: Diagnosis not present

## 2017-11-03 ENCOUNTER — Other Ambulatory Visit: Payer: Self-pay

## 2017-11-03 ENCOUNTER — Encounter: Payer: Self-pay | Admitting: Family Medicine

## 2017-11-03 ENCOUNTER — Encounter: Payer: Self-pay | Admitting: General Practice

## 2017-11-03 ENCOUNTER — Ambulatory Visit (INDEPENDENT_AMBULATORY_CARE_PROVIDER_SITE_OTHER): Payer: Medicare Other | Admitting: Family Medicine

## 2017-11-03 VITALS — BP 122/80 | HR 81 | Temp 98.2°F | Resp 17 | Ht 64.0 in | Wt 145.2 lb

## 2017-11-03 DIAGNOSIS — E1142 Type 2 diabetes mellitus with diabetic polyneuropathy: Secondary | ICD-10-CM

## 2017-11-03 DIAGNOSIS — E785 Hyperlipidemia, unspecified: Secondary | ICD-10-CM | POA: Diagnosis not present

## 2017-11-03 LAB — HEPATIC FUNCTION PANEL
ALT: 18 U/L (ref 0–35)
AST: 19 U/L (ref 0–37)
Albumin: 4.5 g/dL (ref 3.5–5.2)
Alkaline Phosphatase: 83 U/L (ref 39–117)
Bilirubin, Direct: 0.1 mg/dL (ref 0.0–0.3)
Total Bilirubin: 0.4 mg/dL (ref 0.2–1.2)
Total Protein: 6.9 g/dL (ref 6.0–8.3)

## 2017-11-03 LAB — BASIC METABOLIC PANEL
BUN: 29 mg/dL — ABNORMAL HIGH (ref 6–23)
CO2: 29 mEq/L (ref 19–32)
Calcium: 9.8 mg/dL (ref 8.4–10.5)
Chloride: 100 mEq/L (ref 96–112)
Creatinine, Ser: 0.84 mg/dL (ref 0.40–1.20)
GFR: 70.88 mL/min (ref >60.00)
Glucose, Bld: 154 mg/dL — ABNORMAL HIGH (ref 70–99)
Potassium: 4.1 mEq/L (ref 3.5–5.1)
Sodium: 138 mEq/L (ref 135–145)

## 2017-11-03 LAB — LIPID PANEL
Cholesterol: 186 mg/dL (ref 0–200)
HDL: 73.8 mg/dL (ref >39.00)
LDL Cholesterol: 91 mg/dL (ref 0–99)
NonHDL: 112.02
Total CHOL/HDL Ratio: 3
Triglycerides: 106 mg/dL (ref 0.0–149.0)
VLDL: 21.2 mg/dL (ref 0.0–40.0)

## 2017-11-03 LAB — MICROALBUMIN / CREATININE URINE RATIO
Creatinine,U: 51.4 mg/dL
Microalb Creat Ratio: 1.4 mg/g (ref 0.0–30.0)
Microalb, Ur: <0.7 mg/dL (ref 0.0–1.9)

## 2017-11-03 MED ORDER — ROSUVASTATIN CALCIUM 10 MG PO TABS: ORAL_TABLET | ORAL | 1 refills | Status: DC

## 2017-11-03 MED ORDER — OMEPRAZOLE 20 MG PO CPDR: 20.0000 mg | DELAYED_RELEASE_CAPSULE | Freq: Two times a day (BID) | ORAL | 1 refills | Status: DC

## 2017-11-03 NOTE — Assessment & Plan Note (Signed)
Chronic problem.  Tolerating statin w/o difficulty.  Applauded her efforts at diet and exercise.  Check labs.  Adjust meds prn  

## 2017-11-03 NOTE — Progress Notes (Signed)
   Subjective:    Patient ID: [name] [name], female    DOB: Aug 06, 1945, 72 y.o.   MRN: [id number]  HPI Hyperlipidemia- chronic problem, on Crestor 10mg  daily.  Denies CP, SOB, abd pain, N/V, myalgias.  Exercising regularly- goes to gym (Zumba, water aerobics)  DM- chronic problem, follows w/ Dr Chalmers Cater.  Due for microalbumin.   Review of Systems For ROS see HPI     Objective:   Physical Exam  Constitutional: She is oriented to person, place, and time. She appears well-developed and well-nourished. No distress.  HENT:  Head: Normocephalic and atraumatic.  Eyes: Pupils are equal, round, and reactive to light. Conjunctivae and EOM are normal.  Neck: Normal range of motion. Neck supple. No thyromegaly present.  Cardiovascular: Normal rate, regular rhythm, normal heart sounds and intact distal pulses.  No murmur heard. Pulmonary/Chest: Effort normal and breath sounds normal. No respiratory distress.  Abdominal: Soft. She exhibits no distension. There is no tenderness.  Musculoskeletal: She exhibits no edema.  Lymphadenopathy:    She has no cervical adenopathy.  Neurological: She is alert and oriented to person, place, and time.  Skin: Skin is warm and dry.  Psychiatric: She has a normal mood and affect. Her behavior is normal.  Vitals reviewed.         Assessment & Plan:

## 2017-11-03 NOTE — Patient Instructions (Signed)
Follow up in 6 months to recheck cholesterol We'll notify you of your lab results and make any changes if needed Continue to work on healthy diet and regular exercise- you look great! Call with any questions or concerns Happy Fall!!!

## 2017-11-03 NOTE — Assessment & Plan Note (Signed)
UTD on A1C, eye exam, foot exam w/ Dr Chalmers Cater.  Due for Microalbumin

## 2017-11-09 ENCOUNTER — Ambulatory Visit
Admission: RE | Admit: 2017-11-09 | Discharge: 2017-11-09 | Disposition: A | Discharge status: Home / Self Care | Payer: Medicare Other | Source: Ambulatory Visit | Attending: Obstetrics and Gynecology | Admitting: Obstetrics and Gynecology

## 2017-11-09 DIAGNOSIS — Z1231 Encounter for screening mammogram for malignant neoplasm of breast: Secondary | ICD-10-CM

## 2017-12-24 DIAGNOSIS — Z6824 Body mass index (BMI) 24.0-24.9, adult: Secondary | ICD-10-CM | POA: Diagnosis not present

## 2017-12-24 DIAGNOSIS — Z01419 Encounter for gynecological examination (general) (routine) without abnormal findings: Secondary | ICD-10-CM | POA: Diagnosis not present

## 2017-12-31 ENCOUNTER — Other Ambulatory Visit: Payer: Self-pay

## 2017-12-31 ENCOUNTER — Ambulatory Visit (INDEPENDENT_AMBULATORY_CARE_PROVIDER_SITE_OTHER): Payer: Medicare Other | Admitting: Physician Assistant

## 2017-12-31 ENCOUNTER — Encounter: Payer: Self-pay | Admitting: Physician Assistant

## 2017-12-31 VITALS — BP 130/80 | HR 88 | Temp 98.0°F | Resp 14 | Ht 64.0 in | Wt 143.0 lb

## 2017-12-31 DIAGNOSIS — H6121 Impacted cerumen, right ear: Secondary | ICD-10-CM | POA: Diagnosis not present

## 2017-12-31 NOTE — Patient Instructions (Signed)
Everything is cleaned out!  I have attaches some instructions about ear wax and home removal for you to review. You can also consider putting some peroxide in the ears once weekly and let drain to help remove loose wax.    Earwax Buildup, Adult The ears produce a substance called earwax that helps keep bacteria out of the ear and protects the skin in the ear canal. Occasionally, earwax can build up in the ear and cause discomfort or hearing loss. What increases the risk? This condition is more likely to develop in people who:  Are female.  Are elderly.  Naturally produce more earwax.  Clean their ears often with cotton swabs.  Use earplugs often.  Use in-ear headphones often.  Wear hearing aids.  Have narrow ear canals.  Have earwax that is overly thick or sticky.  Have eczema.  Are dehydrated.  Have excess hair in the ear canal.  What are the signs or symptoms? Symptoms of this condition include:  Reduced or muffled hearing.  A feeling of fullness in the ear or feeling that the ear is plugged.  Fluid coming from the ear.  Ear pain.  Ear itch.  Ringing in the ear.  Coughing.  An obvious piece of earwax that can be seen inside the ear canal.  How is this diagnosed? This condition may be diagnosed based on:  Your symptoms.  Your medical history.  An ear exam. During the exam, your health care provider will look into your ear with an instrument called an otoscope.  You may have tests, including a hearing test. How is this treated? This condition may be treated by:  Using ear drops to soften the earwax - recommend Debrox over-the-counter.  Having the earwax removed by a health care provider. The health care provider may: ? Flush the ear with water. ? Use an instrument that has a loop on the end (curette). ? Use a suction device.  Surgery to remove the wax buildup. This may be done in severe cases.  Follow these instructions at home:  Take  over-the-counter and prescription medicines only as told by your health care provider.  Do not put any objects, including cotton swabs, into your ear. You can clean the opening of your ear canal with a washcloth or facial tissue.  Follow instructions from your health care provider about cleaning your ears. Do not over-clean your ears.  Drink enough fluid to keep your urine clear or pale yellow. This will help to thin the earwax.  Keep all follow-up visits as told by your health care provider. If earwax builds up in your ears often or if you use hearing aids, consider seeing your health care provider for routine, preventive ear cleanings. Ask your health care provider how often you should schedule your cleanings.  If you have hearing aids, clean them according to instructions from the manufacturer and your health care provider. Contact a health care provider if:  You have ear pain.  You develop a fever.  You have blood, pus, or other fluid coming from your ear.  You have hearing loss.  You have ringing in your ears that does not go away.  Your symptoms do not improve with treatment.  You feel like the room is spinning (vertigo). Summary  Earwax can build up in the ear and cause discomfort or hearing loss.  The most common symptoms of this condition include reduced or muffled hearing and a feeling of fullness in the ear or feeling that the  ear is plugged.  This condition may be diagnosed based on your symptoms, your medical history, and an ear exam.  This condition may be treated by using ear drops to soften the earwax or by having the earwax removed by a health care provider.  Do not put any objects, including cotton swabs, into your ear. You can clean the opening of your ear canal with a washcloth or facial tissue. This information is not intended to replace advice given to you by your health care provider. Make sure you discuss any questions you have with your health care  provider. Document Released: 02/13/2004 Document Revised: 03/18/2016 Document Reviewed: 03/18/2016 Elsevier Interactive Patient Education  [employer].

## 2017-12-31 NOTE — Progress Notes (Signed)
Patient presents to clinic today requesting ear wax removal. Patient notes she was seen by her Audiologist who stated she needed ears cleaned before they can proceed with testing. Denies ear pain, drainage. Denies ear pressure, popping or URI symptoms. Is otherwise doing very well.   Past Medical History:  Diagnosis Date  . Abdominal pain    EGD and CTs neg, rx Amitriptiline (non ulcer dyspepsia) EGD 12/11.. s/p ballon dilitation  . Anxiety   . Cervical spondylosis without myelopathy 11/30/2012  . Chronic conjunctivitis   . Diabetes mellitus    no meds, diet controlled  . Diverticulosis of colon   . GERD with stricture    s/p dilatations  . History of Lyme disease   . HOH (hard of hearing)    bilateral hearing aids  . Hyperlipidemia   . Hypothyroidism     Dx 10-9 TSH 5.2 , then TSH normalized, eventually went up again 2012  . Melanoma ([place])    h/o  . Memory difficulties 06/05/2014  . Osteopenia   . Solar lentigo    face; s/p excision 3/09. See Derm q 6 months  . Trigeminal neuralgia    h/o     Current Outpatient Medications on File Prior to Visit  Medication Sig Dispense Refill  . ALPRAZolam (XANAX) 0.5 MG tablet TAKE 1 TABLET(0.5 MG) BY MOUTH AT BEDTIME AS NEEDED FOR SLEEP 30 tablet 3  . amitriptyline (ELAVIL) 25 MG tablet Take 1 tablet (25 mg total) by mouth at bedtime. 90 tablet 3  . Cholecalciferol (VITAMIN D3) 2000 UNITS TABS Take 1 tablet by mouth daily.     . diclofenac sodium (VOLTAREN) 1 % GEL Apply topically 4 (four) times daily.    [name] gabapentin (NEURONTIN) 300 MG capsule TAKE 1 CAPSULE BY MOUTH EVERY MORNING AND 2 CAPSULES BY MOUTH EVERY EVENING (Patient taking differently: Take 300 mg by mouth 2 (two) times daily. ) 270 capsule 3  . hyoscyamine (LEVSIN SL) 0.125 MG SL tablet Place 1 tablet (0.125 mg total) under the tongue every 6 (six) hours as needed for cramping. 30 tablet 1  . levothyroxine (SYNTHROID, LEVOTHROID) 50 MCG tablet TK 1 T PO QD  12  . Lifitegrast  (XIIDRA) 5 % SOLN Apply to eye.    . Multiple Vitamin (MULTIVITAMIN) tablet Take 1 tablet by mouth daily.      [name] omeprazole (PRILOSEC) 20 MG capsule Take 1 capsule (20 mg total) by mouth 2 (two) times daily before a meal. 180 capsule 1  . ONETOUCH VERIO test strip 2 (two) times daily. for testing  5  . Probiotic Product (PROBIOTIC PO) Take 1 tablet by mouth daily.    . repaglinide (PRANDIN) 0.5 MG tablet Take 0.5 mg by mouth daily.    . rosuvastatin (CRESTOR) 10 MG tablet TAKE 1 TABLET(10 MG) BY MOUTH DAILY 90 tablet 1  . sucralfate (CARAFATE) 1 g tablet Take 1 tablet (1 g total) by mouth 4 (four) times daily -  with meals and at bedtime. 30 tablet 3   No current facility-administered medications on file prior to visit.     Allergies  Allergen Reactions  . Rocephin [Ceftriaxone] Anaphylaxis  . Prednisone Other (See Comments)    High blood sugars (pt reports)  . Other     VERY DELICATE SKIN FROM MELANOMA   . Benadryl [Diphenhydramine Hcl] Anxiety    Hyperactivity    Family History  Problem Relation Age of Onset  . Prostate cancer Father   . Endometrial cancer  Sister        clear cell carcinoma  . Stroke Sister   . Heart attack Paternal Grandfather        GF in his 73s  . Melanoma Maternal Uncle   . Colon cancer Neg Hx   . Breast cancer Neg Hx   . Diabetes Neg Hx   . Stomach cancer Neg Hx   . Rectal cancer Neg Hx     Social History   Socioeconomic History  . Marital status: Married    Spouse name: Percell Miller  . Number of children: 2  . Years of education: college  . Highest education level: Not on file  Occupational History  . Occupation: retired    Fish farm manager: RETIRED  Social Needs  . Financial resource strain: Not on file  . Food insecurity:    Worry: Not on file    Inability: Not on file  . Transportation needs:    Medical: Not on file    Non-medical: Not on file  Tobacco Use  . Smoking status: Former Smoker    Start date: 01/20/1976    Last attempt to quit: 1981     Years since quitting: 38.9  . Smokeless tobacco: Never Used  Substance and Sexual Activity  . Alcohol use: Yes    Alcohol/week: 2.0 - 3.0 standard drinks    Types: 2 - 3 Glasses of wine per week  . Drug use: No  . Sexual activity: Not on file  Lifestyle  . Physical activity:    Days per week: Not on file    Minutes per session: Not on file  . Stress: Not on file  Relationships  . Social connections:    Talks on phone: Not on file    Gets together: Not on file    Attends religious service: Not on file    Active member of club or organization: Not on file    Attends meetings of clubs or organizations: Not on file    Relationship status: Not on file  Other Topics Concern  . Not on file  Social History Narrative   Patient is married with 2 children.   Patient is right handed.   Patient has college education.   Patient does not drink caffeine.          Review of Systems - See HPI.  All other ROS are negative.  BP 130/80   Pulse 88   Temp 98 F (36.7 C) (Oral)   Resp 14   Ht 5\' 4"  (1.626 m)   Wt 143 lb (64.9 kg)   SpO2 96%   BMI 24.55 kg/m   Physical Exam Vitals signs reviewed.  Constitutional:      Appearance: Normal appearance.  HENT:     Head: Normocephalic and atraumatic.     Right Ear: There is impacted cerumen.     Left Ear: Tympanic membrane and ear canal normal.     Nose: Nose normal.     Mouth/Throat:     Pharynx: Oropharynx is clear.  Neck:     Musculoskeletal: Neck supple.  Pulmonary:     Effort: Pulmonary effort is normal.  Neurological:     General: No focal deficit present.     Mental Status: She is alert.    Recent Results (from the past 2160 hour(s))  Lipid panel     Status: None   Collection Time: 11/03/17  8:45 AM  Result Value Ref Range   Cholesterol 186 0 - 200 mg/dL  Comment: ATP III Classification       Desirable:  < 200 mg/dL               Borderline High:  200 - 239 mg/dL          High:  > = 240 mg/dL   Triglycerides 106.0  0.0 - 149.0 mg/dL    Comment: Normal:  <150 mg/dLBorderline High:  150 - 199 mg/dL   HDL 73.80 >39.00 mg/dL   VLDL 21.2 0.0 - 40.0 mg/dL   LDL Cholesterol 91 0 - 99 mg/dL   Total CHOL/HDL Ratio 3     Comment:                Men          Women1/2 Average Risk     3.4          3.3Average Risk          5.0          4.42X Average Risk          9.6          7.13X Average Risk          15.0          11.0                       NonHDL 112.02     Comment: NOTE:  Non-HDL goal should be 30 mg/dL higher than patient's LDL goal (i.e. LDL goal of < 70 mg/dL, would have non-HDL goal of < 100 mg/dL)  Basic metabolic panel     Status: Abnormal   Collection Time: 11/03/17  8:45 AM  Result Value Ref Range   Sodium 138 135 - 145 mEq/L   Potassium 4.1 3.5 - 5.1 mEq/L   Chloride 100 96 - 112 mEq/L   CO2 29 19 - 32 mEq/L   Glucose, Bld 154 (H) 70 - 99 mg/dL   BUN 29 (H) 6 - 23 mg/dL   Creatinine, Ser 0.84 0.40 - 1.20 mg/dL   Calcium 9.8 8.4 - 10.5 mg/dL   GFR 70.88 >60.00 mL/min  Hepatic function panel     Status: None   Collection Time: 11/03/17  8:45 AM  Result Value Ref Range   Total Bilirubin 0.4 0.2 - 1.2 mg/dL   Bilirubin, Direct 0.1 0.0 - 0.3 mg/dL   Alkaline Phosphatase 83 39 - 117 U/L   AST 19 0 - 37 U/L   ALT 18 0 - 35 U/L   Total Protein 6.9 6.0 - 8.3 g/dL   Albumin 4.5 3.5 - 5.2 g/dL  Urine Microalbumin w/creat. ratio     Status: None   Collection Time: 11/03/17  8:45 AM  Result Value Ref Range   Microalb, Ur <0.7 0.0 - 1.9 mg/dL   Creatinine,U 51.4 mg/dL   Microalb Creat Ratio 1.4 0.0 - 30.0 mg/g    Assessment/Plan: 1. Impacted cerumen of right ear Removed via combination of manual removal with ear curette and irrigation. Supportive measures reviewed with patient.    Leeanne Rio, PA-C

## 2018-01-07 ENCOUNTER — Other Ambulatory Visit: Payer: Self-pay | Admitting: Family Medicine

## 2018-01-07 NOTE — Telephone Encounter (Signed)
Last OV 12/31/17 Alprazolam last filled 09/13/17 #30 with 3

## 2018-01-11 ENCOUNTER — Encounter: Payer: Self-pay | Admitting: Physician Assistant

## 2018-01-11 ENCOUNTER — Telehealth: Payer: Medicare Other | Admitting: Physician Assistant

## 2018-01-11 ENCOUNTER — Ambulatory Visit (INDEPENDENT_AMBULATORY_CARE_PROVIDER_SITE_OTHER): Payer: Medicare Other | Admitting: Physician Assistant

## 2018-01-11 ENCOUNTER — Other Ambulatory Visit: Payer: Self-pay

## 2018-01-11 VITALS — BP 124/78 | HR 86 | Temp 98.1°F | Resp 16 | Ht 64.0 in | Wt 141.2 lb

## 2018-01-11 DIAGNOSIS — N3000 Acute cystitis without hematuria: Secondary | ICD-10-CM | POA: Diagnosis not present

## 2018-01-11 DIAGNOSIS — R399 Unspecified symptoms and signs involving the genitourinary system: Secondary | ICD-10-CM

## 2018-01-11 LAB — POCT URINALYSIS DIPSTICK
Bilirubin, UA: NEGATIVE
Glucose, UA: NEGATIVE
Ketones, UA: NEGATIVE
Protein, UA: POSITIVE — AB
Spec Grav, UA: 1.015 (ref 1.010–1.025)
Urobilinogen, UA: NEGATIVE E.U./dL — AB
pH, UA: 6 (ref 5.0–8.0)

## 2018-01-11 MED ORDER — CIPROFLOXACIN HCL 500 MG PO TABS: 500.0000 mg | ORAL_TABLET | Freq: Two times a day (BID) | ORAL | 0 refills | Status: DC

## 2018-01-11 NOTE — Progress Notes (Signed)
Patient presents to clinic today c/o 3.5 days of urinary urgency and frequency with some hesitancy. Notes foul-smelling urine and dysuria. Notes mild pain with urination. Denies fevers, chills, vomiting. Denies flank pain. Notes feeling sluggish.  Past Medical History:  Diagnosis Date  . Abdominal pain    EGD and CTs neg, rx Amitriptiline (non ulcer dyspepsia) EGD 12/11.. s/p ballon dilitation  . Anxiety   . Cervical spondylosis without myelopathy 11/30/2012  . Chronic conjunctivitis   . Diabetes mellitus    no meds, diet controlled  . Diverticulosis of colon   . GERD with stricture    s/p dilatations  . History of Lyme disease   . HOH (hard of hearing)    bilateral hearing aids  . Hyperlipidemia   . Hypothyroidism     Dx 10-9 TSH 5.2 , then TSH normalized, eventually went up again 2012  . Melanoma ([place])    h/o  . Memory difficulties 06/05/2014  . Osteopenia   . Solar lentigo    face; s/p excision 3/09. See Derm q 6 months  . Trigeminal neuralgia    h/o     Current Outpatient Medications on File Prior to Visit  Medication Sig Dispense Refill  . ALPRAZolam (XANAX) 0.5 MG tablet TAKE 1 TABLET(0.5 MG) BY MOUTH AT BEDTIME AS NEEDED FOR SLEEP 30 tablet 0  . amitriptyline (ELAVIL) 25 MG tablet Take 1 tablet (25 mg total) by mouth at bedtime. 90 tablet 3  . Cholecalciferol (VITAMIN D3) 2000 UNITS TABS Take 1 tablet by mouth daily.     . diclofenac sodium (VOLTAREN) 1 % GEL Apply topically 4 (four) times daily.    [name] gabapentin (NEURONTIN) 300 MG capsule TAKE 1 CAPSULE BY MOUTH EVERY MORNING AND 2 CAPSULES BY MOUTH EVERY EVENING (Patient taking differently: Take 300 mg by mouth 2 (two) times daily. ) 270 capsule 3  . hyoscyamine (LEVSIN SL) 0.125 MG SL tablet Place 1 tablet (0.125 mg total) under the tongue every 6 (six) hours as needed for cramping. 30 tablet 1  . levothyroxine (SYNTHROID, LEVOTHROID) 50 MCG tablet TK 1 T PO QD  12  . Lifitegrast (XIIDRA) 5 % SOLN Apply to eye.      . Multiple Vitamin (MULTIVITAMIN) tablet Take 1 tablet by mouth daily.      [name] omeprazole (PRILOSEC) 20 MG capsule Take 1 capsule (20 mg total) by mouth 2 (two) times daily before a meal. 180 capsule 1  . ONETOUCH VERIO test strip 2 (two) times daily. for testing  5  . Probiotic Product (PROBIOTIC PO) Take 1 tablet by mouth daily.    . repaglinide (PRANDIN) 0.5 MG tablet Take 0.5 mg by mouth daily.    . rosuvastatin (CRESTOR) 10 MG tablet TAKE 1 TABLET(10 MG) BY MOUTH DAILY 90 tablet 1  . sucralfate (CARAFATE) 1 g tablet Take 1 tablet (1 g total) by mouth 4 (four) times daily -  with meals and at bedtime. 30 tablet 3   No current facility-administered medications on file prior to visit.     Allergies  Allergen Reactions  . Rocephin [Ceftriaxone] Anaphylaxis  . Prednisone Other (See Comments)    High blood sugars (pt reports)  . Other     VERY DELICATE SKIN FROM MELANOMA   . Benadryl [Diphenhydramine Hcl] Anxiety    Hyperactivity    Family History  Problem Relation Age of Onset  . Prostate cancer Father   . Endometrial cancer Sister        clear  cell carcinoma  . Stroke Sister   . Heart attack Paternal Grandfather        GF in his 9s  . Melanoma Maternal Uncle   . Colon cancer Neg Hx   . Breast cancer Neg Hx   . Diabetes Neg Hx   . Stomach cancer Neg Hx   . Rectal cancer Neg Hx     Social History   Socioeconomic History  . Marital status: Married    Spouse name: Percell Miller  . Number of children: 2  . Years of education: college  . Highest education level: Not on file  Occupational History  . Occupation: retired    Fish farm manager: RETIRED  Social Needs  . Financial resource strain: Not on file  . Food insecurity:    Worry: Not on file    Inability: Not on file  . Transportation needs:    Medical: Not on file    Non-medical: Not on file  Tobacco Use  . Smoking status: Former Smoker    Start date: 01/20/1976    Last attempt to quit: 1981    Years since quitting: 39.0   . Smokeless tobacco: Never Used  Substance and Sexual Activity  . Alcohol use: Yes    Alcohol/week: 2.0 - 3.0 standard drinks    Types: 2 - 3 Glasses of wine per week  . Drug use: No  . Sexual activity: Not on file  Lifestyle  . Physical activity:    Days per week: Not on file    Minutes per session: Not on file  . Stress: Not on file  Relationships  . Social connections:    Talks on phone: Not on file    Gets together: Not on file    Attends religious service: Not on file    Active member of club or organization: Not on file    Attends meetings of clubs or organizations: Not on file    Relationship status: Not on file  Other Topics Concern  . Not on file  Social History Narrative   Patient is married with 2 children.   Patient is right handed.   Patient has college education.   Patient does not drink caffeine.          Review of Systems - See HPI.  All other ROS are negative.  BP 124/78   Pulse 86   Temp 98.1 F (36.7 C) (Oral)   Resp 16   Ht 5\' 4"  (1.626 m)   Wt 141 lb 3.2 oz (64 kg)   SpO2 98%   BMI 24.24 kg/m   Physical Exam Vitals signs reviewed.  Constitutional:      Appearance: Normal appearance.  HENT:     Head: Normocephalic and atraumatic.     Right Ear: Tympanic membrane normal.     Left Ear: Tympanic membrane normal.     Mouth/Throat:     Mouth: Mucous membranes are moist.  Neck:     Musculoskeletal: Neck supple.  Cardiovascular:     Rate and Rhythm: Normal rate and regular rhythm.  Pulmonary:     Effort: Pulmonary effort is normal.     Breath sounds: Normal breath sounds.  Abdominal:     General: Abdomen is flat. Bowel sounds are normal.     Tenderness: There is no abdominal tenderness. There is no right CVA tenderness or left CVA tenderness.  Neurological:     Mental Status: She is alert.  Psychiatric:        Mood and  Affect: Mood normal.     Recent Results (from the past 2160 hour(s))  Lipid panel     Status: None   Collection  Time: 11/03/17  8:45 AM  Result Value Ref Range   Cholesterol 186 0 - 200 mg/dL    Comment: ATP III Classification       Desirable:  < 200 mg/dL               Borderline High:  200 - 239 mg/dL          High:  > = 240 mg/dL   Triglycerides 106.0 0.0 - 149.0 mg/dL    Comment: Normal:  <150 mg/dLBorderline High:  150 - 199 mg/dL   HDL 73.80 >39.00 mg/dL   VLDL 21.2 0.0 - 40.0 mg/dL   LDL Cholesterol 91 0 - 99 mg/dL   Total CHOL/HDL Ratio 3     Comment:                Men          Women1/2 Average Risk     3.4          3.3Average Risk          5.0          4.42X Average Risk          9.6          7.13X Average Risk          15.0          11.0                       NonHDL 112.02     Comment: NOTE:  Non-HDL goal should be 30 mg/dL higher than patient's LDL goal (i.e. LDL goal of < 70 mg/dL, would have non-HDL goal of < 100 mg/dL)  Basic metabolic panel     Status: Abnormal   Collection Time: 11/03/17  8:45 AM  Result Value Ref Range   Sodium 138 135 - 145 mEq/L   Potassium 4.1 3.5 - 5.1 mEq/L   Chloride 100 96 - 112 mEq/L   CO2 29 19 - 32 mEq/L   Glucose, Bld 154 (H) 70 - 99 mg/dL   BUN 29 (H) 6 - 23 mg/dL   Creatinine, Ser 0.84 0.40 - 1.20 mg/dL   Calcium 9.8 8.4 - 10.5 mg/dL   GFR 70.88 >60.00 mL/min  Hepatic function panel     Status: None   Collection Time: 11/03/17  8:45 AM  Result Value Ref Range   Total Bilirubin 0.4 0.2 - 1.2 mg/dL   Bilirubin, Direct 0.1 0.0 - 0.3 mg/dL   Alkaline Phosphatase 83 39 - 117 U/L   AST 19 0 - 37 U/L   ALT 18 0 - 35 U/L   Total Protein 6.9 6.0 - 8.3 g/dL   Albumin 4.5 3.5 - 5.2 g/dL  Urine Microalbumin w/creat. ratio     Status: None   Collection Time: 11/03/17  8:45 AM  Result Value Ref Range   Microalb, Ur <0.7 0.0 - 1.9 mg/dL   Creatinine,U 51.4 mg/dL   Microalb Creat Ratio 1.4 0.0 - 30.0 mg/g  POCT Urinalysis Dipstick     Status: Abnormal   Collection Time: 01/11/18 10:40 AM  Result Value Ref Range   Color, UA yellow    Clarity, UA  cloudy    Glucose, UA Negative Negative   Bilirubin, UA negative    Ketones, UA negative  Spec Grav, UA 1.015 1.010 - 1.025   Blood, UA 1+    pH, UA 6.0 5.0 - 8.0   Protein, UA Positive (A) Negative   Urobilinogen, UA negative (A) 0.2 or 1.0 E.U./dL   Nitrite, UA +    Leukocytes, UA Large (3+) (A) Negative   Appearance     Odor      Assessment/Plan: 1. Acute cystitis without hematuria Urine dip + LE and nitrites. Culture sent. Will start empirically on Cipro 500 mg BID x 5 days. Will alter based on culture. Supportive measures and OTC medications reviewed. Strict return precautions discussed.   - POCT Urinalysis Dipstick - Urine Culture - ciprofloxacin (CIPRO) 500 MG tablet; Take 1 tablet (500 mg total) by mouth 2 (two) times daily.  Dispense: 10 tablet; Refill: 0

## 2018-01-11 NOTE — Patient Instructions (Signed)
Your symptoms are consistent with a bladder infection, also called acute cystitis. Please take your antibiotic (Cipro) as directed until all pills are gone.  Stay very well hydrated.  Consider a daily probiotic (Align, Culturelle, or Activia) to help prevent stomach upset caused by the antibiotic.  Taking a probiotic daily may also help prevent recurrent UTIs.  Also consider taking AZO (Phenazopyridine) tablets to help decrease pain with urination.  I will call you with your urine testing results.  We will change antibiotics if indicated.  Call or return to clinic if symptoms are not resolved by completion of antibiotic.   Urinary Tract Infection A urinary tract infection (UTI) can occur any place along the urinary tract. The tract includes the kidneys, ureters, bladder, and urethra. A type of germ called bacteria often causes a UTI. UTIs are often helped with antibiotic medicine.  HOME CARE   If given, take antibiotics as told by your doctor. Finish them even if you start to feel better.  Drink enough fluids to keep your pee (urine) clear or pale yellow.  Avoid tea, drinks with caffeine, and bubbly (carbonated) drinks.  Pee often. Avoid holding your pee in for a long time.  Pee before and after having sex (intercourse).  Wipe from front to back after you poop (bowel movement) if you are a woman. Use each tissue only once. GET HELP RIGHT AWAY IF:   You have back pain.  You have lower belly (abdominal) pain.  You have chills.  You feel sick to your stomach (nauseous).  You throw up (vomit).  Your burning or discomfort with peeing does not go away.  You have a fever.  Your symptoms are not better in 3 days. MAKE SURE YOU:   Understand these instructions.  Will watch your condition.  Will get help right away if you are not doing well or get worse. Document Released: 06/24/2007 Document Revised: 09/30/2011 Document Reviewed: 08/06/2011 [employer] Patient Information 2015  [employer]. This information is not intended to replace advice given to you by your health care provider. Make sure you discuss any questions you have with your health care provider.    

## 2018-01-11 NOTE — Progress Notes (Signed)
Based on what you shared with me it looks like you have a serious condition that should be evaluated in a face to face office visit.  NOTE: If you entered your credit card information for this eVisit, you will not be charged. You may see a "hold" on your card for the $30 but that hold will drop off and you will not have a charge processed.  If you are having a true medical emergency please call 911.  If you need an urgent face to face visit, [facility] has four urgent care centers for your convenience.  If you need care fast and have a high deductible or no insurance consider:   [email or web address].[email or web address] to reserve your spot online an avoid wait times  [facility] [address], [address] [place], [place] [address] 8 am to 8 pm Monday-Friday 10 am to 4 pm Saturday-Sunday *Across the street from [employer]  [facility]  [address] [place], [address] 8 am to 5 pm Monday-Friday * In the [facility]   The following sites will take your  insurance:  . [facility]  [phone] Get Driving Directions Find a Provider at this Location  [address] [place], [place] [address] . 10 am to 8 pm Monday-Friday . 12 pm to 8 pm Saturday-Sunday   . [facility]  [phone] Get Driving Directions Find a Provider at this Location  [address], [address] [place], [place] [address] . 8 am to 8 pm Monday-Friday . 9 am to 6 pm Saturday . 11 am to 6 pm Sunday   . [facility] [place]  [phone] Get Driving Directions  [address] [place], [place] [address] . 8 am to 8 pm Monday-Friday . 8 am to 4 pm Saturday-Sunday   Your e-visit answers were reviewed by a board certified advanced clinical practitioner to complete your personal care plan.  Thank you for using e-Visits.  

## 2018-01-13 ENCOUNTER — Ambulatory Visit: Payer: Medicare Other | Admitting: Physician Assistant

## 2018-01-13 LAB — URINE CULTURE
MICRO NUMBER:: 91538869
SPECIMEN QUALITY:: ADEQUATE

## 2018-01-17 DIAGNOSIS — M4322 Fusion of spine, cervical region: Secondary | ICD-10-CM | POA: Diagnosis not present

## 2018-01-17 DIAGNOSIS — M542 Cervicalgia: Secondary | ICD-10-CM | POA: Diagnosis not present

## 2018-01-17 DIAGNOSIS — Z6824 Body mass index (BMI) 24.0-24.9, adult: Secondary | ICD-10-CM | POA: Diagnosis not present

## 2018-01-24 DIAGNOSIS — H01022 Squamous blepharitis right lower eyelid: Secondary | ICD-10-CM | POA: Diagnosis not present

## 2018-01-24 DIAGNOSIS — H01025 Squamous blepharitis left lower eyelid: Secondary | ICD-10-CM | POA: Diagnosis not present

## 2018-01-24 DIAGNOSIS — H16223 Keratoconjunctivitis sicca, not specified as Sjogren's, bilateral: Secondary | ICD-10-CM | POA: Diagnosis not present

## 2018-01-24 DIAGNOSIS — H01021 Squamous blepharitis right upper eyelid: Secondary | ICD-10-CM | POA: Diagnosis not present

## 2018-01-24 DIAGNOSIS — H01024 Squamous blepharitis left upper eyelid: Secondary | ICD-10-CM | POA: Diagnosis not present

## 2018-01-24 DIAGNOSIS — H2513 Age-related nuclear cataract, bilateral: Secondary | ICD-10-CM | POA: Diagnosis not present

## 2018-01-24 DIAGNOSIS — E119 Type 2 diabetes mellitus without complications: Secondary | ICD-10-CM | POA: Diagnosis not present

## 2018-01-24 LAB — HM DIABETES EYE EXAM

## 2018-02-03 ENCOUNTER — Other Ambulatory Visit: Payer: Self-pay | Admitting: Family Medicine

## 2018-02-03 NOTE — Telephone Encounter (Signed)
Last OV 01/11/18 Alprazolam last filled 01/07/18 #30 with 0

## 2018-02-17 ENCOUNTER — Encounter: Payer: Self-pay | Admitting: General Practice

## 2018-02-21 LAB — HEMOGLOBIN A1C: Hemoglobin A1C: 7.2

## 2018-02-23 DIAGNOSIS — N189 Chronic kidney disease, unspecified: Secondary | ICD-10-CM | POA: Diagnosis not present

## 2018-02-23 DIAGNOSIS — E7800 Pure hypercholesterolemia, unspecified: Secondary | ICD-10-CM | POA: Diagnosis not present

## 2018-02-23 DIAGNOSIS — E1142 Type 2 diabetes mellitus with diabetic polyneuropathy: Secondary | ICD-10-CM | POA: Diagnosis not present

## 2018-02-23 DIAGNOSIS — E049 Nontoxic goiter, unspecified: Secondary | ICD-10-CM | POA: Diagnosis not present

## 2018-02-23 DIAGNOSIS — G609 Hereditary and idiopathic neuropathy, unspecified: Secondary | ICD-10-CM | POA: Diagnosis not present

## 2018-02-23 DIAGNOSIS — N390 Urinary tract infection, site not specified: Secondary | ICD-10-CM | POA: Diagnosis not present

## 2018-02-23 DIAGNOSIS — R300 Dysuria: Secondary | ICD-10-CM | POA: Diagnosis not present

## 2018-02-23 DIAGNOSIS — E039 Hypothyroidism, unspecified: Secondary | ICD-10-CM | POA: Diagnosis not present

## 2018-03-07 NOTE — Progress Notes (Signed)
[facility]  PATIENT: [name] [name] DOB: 12-28-1945   REASON FOR VISIT: Follow-up for trigeminal neuralgia, worsening headaches  HISTORY FROM: Patient    HISTORY OF PRESENT ILLNESS:UPDATE 2/18/2020CM [name] [name], 73 year old female returns for follow-up with a history of cervicogenic headaches and trigeminal neuralgia.  When last seen her headaches were doing well and she wanted to titrate off of propanolol which she did.  Her headaches have gotten worse since that time she is also on amitriptyline and gabapentin.  We have attempted to taper amitriptyline in the past without success.  She is new on insulin.  Most recent hemoglobin A1c 7.1 she denies any bouts of vertigo.  She goes to water aerobics 3 times a week and also works out at [employer] with balance exercises.  She denies any falls.  She remains quite active.  She returns for reevaluation   UPDATE 9/10/2019CM [name] [name], 73 year old female returns for follow-up with a history of cervical spondylosis and cervicogenic headache and trigeminal neuralgia.  She had surgery in March spinal  fusion C5 and C6.  Her cervical headaches are much improved and she wants to come off of the propanolol. She also has trigeminal neuralgia which is well controlled on a combination of amitriptyline and gabapentin.  She has tried to taper amitriptyline in the past unsuccessfully.  She denies any recent falls.  She denies any bouts of vertigo.  She continues to be quite active.  She returns for reevaluation  09/28/16 CMMs. [name] [name] is a 73 year old right-handed white female with a history of cervical spondylosis and cervicogenic headache and  trigeminal neuralgia. The patient  does fairly well with a combination of propranolol and amitriptyline and gabapentin for her headaches and facial pain. The patient takes only 10 mg twice daily of the propranolol, she takes 25 mg at night of the amitriptyline. The patient has tried to cut back on  amitriptyline because she has dry eyes , but her symptoms severely worsened, and she cannot tolerate the dose decrease. She is also on gabapentin 300 mg 1 in the morning and 2 every evening The patient returns to this office for further evaluation. She has noted some gait instability at times.No recent falls.Has  Bouts of vertigo that come and go. She returns for reevaluation  REVIEW OF SYSTEMS: Full 14 system review of systems performed and notable only for those listed, all others are neg:  Constitutional: neg  Cardiovascular: neg Ear/Nose/Throat: neg Skin: neg Eyes: neg Respiratory: neg Gastroitestinal: neg  Hematology/Lymphatic: neg  Endocrine: neg Musculoskeletal: neg Allergy/Immunology: neg Neurological: Facial pain, cervicogenic headaches Psychiatric:neg Sleep : neg   ALLERGIES: Allergies  Allergen Reactions  . Rocephin [Ceftriaxone] Anaphylaxis  . Prednisone Other (See Comments)    High blood sugars (pt reports)  . Other     VERY DELICATE SKIN FROM MELANOMA   . Benadryl [Diphenhydramine Hcl] Anxiety    Hyperactivity    HOME MEDICATIONS: Outpatient Medications Prior to Visit  Medication Sig Dispense Refill  . ALPRAZolam (XANAX) 0.5 MG tablet TAKE 1 TABLET(0.5 MG) BY MOUTH AT BEDTIME AS NEEDED FOR SLEEP 30 tablet 3  . amitriptyline (ELAVIL) 25 MG tablet Take 1 tablet (25 mg total) by mouth at bedtime. 90 tablet 3  . Cholecalciferol (VITAMIN D3) 2000 UNITS TABS Take 1 tablet by mouth daily.     . diclofenac sodium (VOLTAREN) 1 % GEL Apply topically 4 (four) times daily.    [name] gabapentin (NEURONTIN) 300 MG capsule TAKE 1 CAPSULE BY MOUTH EVERY MORNING  AND 2 CAPSULES BY MOUTH EVERY EVENING (Patient taking differently: Take 300 mg by mouth 2 (two) times daily. ) 270 capsule 3  . hyoscyamine (LEVSIN SL) 0.125 MG SL tablet Place 1 tablet (0.125 mg total) under the tongue every 6 (six) hours as needed for cramping. 30 tablet 1  . LEVEMIR FLEXTOUCH 100 UNIT/ML Pen Inject 7 Units  into the skin at bedtime.     [name] levothyroxine (SYNTHROID, LEVOTHROID) 50 MCG tablet TK 1 T PO QD  12  . Lifitegrast (XIIDRA) 5 % SOLN Apply to eye.    . Multiple Vitamin (MULTIVITAMIN) tablet Take 1 tablet by mouth daily.      [name] omeprazole (PRILOSEC) 20 MG capsule Take 1 capsule (20 mg total) by mouth 2 (two) times daily before a meal. 180 capsule 1  . ONETOUCH VERIO test strip 2 (two) times daily. for testing  5  . Probiotic Product (PROBIOTIC PO) Take 1 tablet by mouth daily.    . rosuvastatin (CRESTOR) 10 MG tablet TAKE 1 TABLET(10 MG) BY MOUTH DAILY 90 tablet 1  . sucralfate (CARAFATE) 1 g tablet Take 1 tablet (1 g total) by mouth 4 (four) times daily -  with meals and at bedtime. 30 tablet 3  . repaglinide (PRANDIN) 0.5 MG tablet Take 0.5 mg by mouth daily.    . ciprofloxacin (CIPRO) 500 MG tablet Take 1 tablet (500 mg total) by mouth 2 (two) times daily. 10 tablet 0   No facility-administered medications prior to visit.     PAST MEDICAL HISTORY: Past Medical History:  Diagnosis Date  . Abdominal pain    EGD and CTs neg, rx Amitriptiline (non ulcer dyspepsia) EGD 12/11.. s/p ballon dilitation  . Anxiety   . Cervical spondylosis without myelopathy 11/30/2012  . Chronic conjunctivitis   . Diabetes mellitus    no meds, diet controlled  . Diverticulosis of colon   . GERD with stricture    s/p dilatations  . History of Lyme disease   . HOH (hard of hearing)    bilateral hearing aids  . Hyperlipidemia   . Hypothyroidism     Dx 10-9 TSH 5.2 , then TSH normalized, eventually went up again 2012  . Melanoma ([place])    h/o  . Memory difficulties 06/05/2014  . Osteopenia   . Solar lentigo    face; s/p excision 3/09. See Derm q 6 months  . Trigeminal neuralgia    h/o     PAST SURGICAL HISTORY: Past Surgical History:  Procedure Laterality Date  . ABDOMINAL HYSTERECTOMY    . ANKLE SURGERY  07/01/2015   R andkle tendon surgery  . BREAST BIOPSY Left 1988  . BREAST EXCISIONAL  BIOPSY Left 1988  . CERVICAL FUSION  06/2017  . CHOLECYSTECTOMY    . COLON RESECTION  1999   d/t diverticulitis  . COLON SURGERY    . melanoma resection Right     Right Arm Surgery, R facial surgery (05-2012)   . PTX s/t chest tube    . SHOULDER SURGERY     right  . WRIST SURGERY     removed melaoma, right    FAMILY HISTORY: Family History  Problem Relation Age of Onset  . Prostate cancer Father   . Endometrial cancer Sister        clear cell carcinoma  . Stroke Sister   . Heart attack Paternal Grandfather        GF in his 14s  . Melanoma Maternal Uncle   .  Colon cancer Neg Hx   . Breast cancer Neg Hx   . Diabetes Neg Hx   . Stomach cancer Neg Hx   . Rectal cancer Neg Hx     SOCIAL HISTORY: Social History   Socioeconomic History  . Marital status: Married    Spouse name: Percell Miller  . Number of children: 2  . Years of education: college  . Highest education level: Not on file  Occupational History  . Occupation: retired    Fish farm manager: RETIRED  Social Needs  . Financial resource strain: Not on file  . Food insecurity:    Worry: Not on file    Inability: Not on file  . Transportation needs:    Medical: Not on file    Non-medical: Not on file  Tobacco Use  . Smoking status: Former Smoker    Start date: 01/20/1976    Last attempt to quit: 1981    Years since quitting: 39.1  . Smokeless tobacco: Never Used  Substance and Sexual Activity  . Alcohol use: Yes    Alcohol/week: 2.0 - 3.0 standard drinks    Types: 2 - 3 Glasses of wine per week  . Drug use: No  . Sexual activity: Not on file  Lifestyle  . Physical activity:    Days per week: Not on file    Minutes per session: Not on file  . Stress: Not on file  Relationships  . Social connections:    Talks on phone: Not on file    Gets together: Not on file    Attends religious service: Not on file    Active member of club or organization: Not on file    Attends meetings of clubs or organizations: Not on file     Relationship status: Not on file  . Intimate partner violence:    Fear of current or ex partner: Not on file    Emotionally abused: Not on file    Physically abused: Not on file    Forced sexual activity: Not on file  Other Topics Concern  . Not on file  Social History Narrative   Patient is married with 2 children.   Patient is right handed.   Patient has college education.   Patient does not drink caffeine.         Behind the right   PHYSICAL EXAM  Vitals:   03/08/18 1024  BP: 129/79  Pulse: 85  Weight: 144 lb 6.4 oz (65.5 kg)  Height: 5\' 4"  (1.626 m)   Body mass index is 24.79 kg/m.  Generalized: Well developed, in no acute distress  Head: normocephalic and atraumatic,. Oropharynx benign  Musculoskeletal: No deformity  Neurological examination   Mentation: Alert oriented to time, place, history taking. Attention span and concentration appropriate. Recent and remote memory intact.  Follows all commands speech and language fluent.   Cranial nerve II-XII: Pupils were equal round reactive to light extraocular movements were full, visual field were full on confrontational test. Facial sensation and strength were normal. hearing was intact to finger rubbing bilaterally. Uvula tongue midline. head turning and shoulder shrug were normal and symmetric.Tongue protrusion into cheek strength was normal. Motor: normal bulk and tone, full strength in the BUE, BLE, fine finger movements normal, Sensory: normal and symmetric to light touch,   Coordination: finger-nose-finger, heel-to-shin bilaterally, no dysmetria Reflexes: Symmetric upper and lower plantar responses were flexor bilaterally. Gait and Station: Rising up from seated position without assistance, normal stance,  moderate stride, good arm swing,  smooth turning, , unsteady with tandem.  No assistive device   DIAGNOSTIC DATA (LABS, IMAGING, TESTING) - I reviewed patient records, labs, notes, testing and imaging myself  where available.  Lab Results  Component Value Date   WBC 6.4 05/17/2017   HGB 13.5 05/17/2017   HCT 40.0 05/17/2017   MCV 91.5 05/17/2017   PLT 224.0 05/17/2017      Component Value Date/Time   NA 138 11/03/2017 0845   NA 143 03/12/2014   NA 143 03/12/2014   K 4.1 11/03/2017 0845   CL 100 11/03/2017 0845   CO2 29 11/03/2017 0845   GLUCOSE 154 (H) 11/03/2017 0845   GLUCOSE 126 (H) 11/11/2005 0831   BUN 29 (H) 11/03/2017 0845   BUN 27 (A) 03/12/2014   CREATININE 0.84 11/03/2017 0845   CALCIUM 9.8 11/03/2017 0845   PROT 6.9 11/03/2017 0845   ALBUMIN 4.5 11/03/2017 0845   AST 19 11/03/2017 0845   ALT 18 11/03/2017 0845   ALKPHOS 83 11/03/2017 0845   BILITOT 0.4 11/03/2017 0845   GFRNONAA >60 09/01/2016 2300   GFRAA >60 09/01/2016 2300   Lab Results  Component Value Date   CHOL 186 11/03/2017   HDL 73.80 11/03/2017   LDLCALC 91 11/03/2017   LDLDIRECT 148.1 06/11/2006   TRIG 106.0 11/03/2017   CHOLHDL 3 11/03/2017   Lab Results  Component Value Date   HGBA1C 7.1 08/23/2017    Lab Results  Component Value Date   TSH 3.81 10/07/2015      ASSESSMENT AND PLAN  73 y.o. year old female  has a past medical history of ; Cervical spondylosis without myelopathy (11/30/2012); Trigeminal neuralgia. Mild gait instability.  She had cervical fusion C5-C6 in March with much improvement in her cervical headaches.  She discontinued her propanolol after last visit and headaches worsened.  PLAN: Increase  gabapentin  To 1 cap in the am 1 cap at 2pm and 2 at bedtime will refill. Try this dosage for 2 weeks then call for further titration.  Continue Elavil at current dose will refill Continue exercise this will help DM and overall health and well being Follow-up 6 months Dennie Bible, [facility], [facility], APRN  [facility] [address], [place], [place] [address] [phone]

## 2018-03-08 ENCOUNTER — Ambulatory Visit (INDEPENDENT_AMBULATORY_CARE_PROVIDER_SITE_OTHER): Payer: Medicare Other | Admitting: Nurse Practitioner

## 2018-03-08 ENCOUNTER — Encounter: Payer: Self-pay | Admitting: Nurse Practitioner

## 2018-03-08 VITALS — BP 129/79 | HR 85 | Ht 64.0 in | Wt 144.4 lb

## 2018-03-08 DIAGNOSIS — M47812 Spondylosis without myelopathy or radiculopathy, cervical region: Secondary | ICD-10-CM | POA: Diagnosis not present

## 2018-03-08 DIAGNOSIS — G44099 Other trigeminal autonomic cephalgias (TAC), not intractable: Secondary | ICD-10-CM

## 2018-03-08 DIAGNOSIS — G500 Trigeminal neuralgia: Secondary | ICD-10-CM

## 2018-03-08 MED ORDER — GABAPENTIN 300 MG PO CAPS: ORAL_CAPSULE | ORAL | 6 refills | Status: DC

## 2018-03-08 MED ORDER — AMITRIPTYLINE HCL 25 MG PO TABS: 25.0000 mg | ORAL_TABLET | Freq: Every day | ORAL | 3 refills | Status: DC

## 2018-03-08 NOTE — Patient Instructions (Signed)
Increase  gabapentin  To 1 cap in the am 1 cap at 2pm and 2 at bedtime will refill Continue Elavil at current dose will refill Continue exercise this will help DM and overall health and well being Follow-up 6 months

## 2018-03-08 NOTE — Progress Notes (Signed)
I have read the note, and I agree with the clinical assessment and plan.  Joffre Lucks K Chania Kochanski   

## 2018-03-14 ENCOUNTER — Encounter: Payer: Self-pay | Admitting: Family Medicine

## 2018-03-14 ENCOUNTER — Ambulatory Visit (INDEPENDENT_AMBULATORY_CARE_PROVIDER_SITE_OTHER): Payer: Medicare Other | Admitting: Family Medicine

## 2018-03-14 ENCOUNTER — Other Ambulatory Visit: Payer: Self-pay

## 2018-03-14 VITALS — BP 122/80 | HR 92 | Temp 98.1°F | Resp 16 | Ht 64.0 in | Wt 144.0 lb

## 2018-03-14 DIAGNOSIS — K1379 Other lesions of oral mucosa: Secondary | ICD-10-CM | POA: Diagnosis not present

## 2018-03-14 DIAGNOSIS — K121 Other forms of stomatitis: Secondary | ICD-10-CM

## 2018-03-14 MED ORDER — TRIAMCINOLONE ACETONIDE 0.1 % MT PSTE: 1.0000 "application " | PASTE | Freq: Two times a day (BID) | OROMUCOSAL | 0 refills | Status: DC

## 2018-03-14 MED ORDER — VALACYCLOVIR HCL 1 G PO TABS: 1000.0000 mg | ORAL_TABLET | Freq: Three times a day (TID) | ORAL | 0 refills | Status: DC

## 2018-03-14 NOTE — Progress Notes (Signed)
   Subjective:    Patient ID: [name] [name], female    DOB: 1945/03/21, 73 y.o.   MRN: [id number]  HPI Mouth pain- pt started taking Valtrex 1 week ago for 'the start of something on the roof of my mouth'.  No relief of pain.  Saw dentist this AM and no cause of pain was found.  Pt has hx of trigeminal neuralgia, gabapentin was increased at last neurology visit and no relief.  Pain is the roof of mouth- both sides.  No recollection of injury or burn.  Pain is not improving or worsening in the last week.  Pt was given a prescription for oral rinse.  Pain described 'as a nerve pain.  It just radiates' to L side of face.  Pain on roof of mouth is a 'burning'- 'it's awful when I have to eat'.   Review of Systems For ROS see HPI     Objective:   Physical Exam Vitals signs reviewed.  Constitutional:      General: [name].    Appearance: Normal appearance. She is not ill-appearing.  HENT:     Head: Normocephalic and atraumatic.     Mouth/Throat:     Mouth: Mucous membranes are moist.     Comments: Pt has ulcer on roof mouth but not consistent w/ zoster lesions Lymphadenopathy:     Cervical: No cervical adenopathy.  Skin:    General: Skin is warm and dry.     Findings: No rash.  Neurological:     General: No focal deficit present.     Mental Status: She is alert and oriented to person, place, and time.           Assessment & Plan:  Mouth ulcer- new.  No evidence of Zoster.  No rash consistent w/ HFM.  Ulcer has appearance of aphthous ulcer.  Dentist prescribed oral rinse that pt has not picked up.  Start Kenalog paste for sx relief.  Reviewed supportive care and red flags that should prompt return.  Pt expressed understanding and is in agreement w/ plan.

## 2018-03-14 NOTE — Patient Instructions (Addendum)
Follow up as needed or as scheduled CHANGE the Valtrex to 3x/day x7 days USE the Triamcinolone paste twice daily on the roof of the mouth USE the mouth rinse as directed by dentist Drink plenty of fluids Call with any questions or concerns Hang in there!

## 2018-03-30 ENCOUNTER — Other Ambulatory Visit: Payer: Self-pay | Admitting: Physician Assistant

## 2018-03-30 DIAGNOSIS — M542 Cervicalgia: Secondary | ICD-10-CM | POA: Diagnosis not present

## 2018-03-30 DIAGNOSIS — R1012 Left upper quadrant pain: Secondary | ICD-10-CM

## 2018-03-30 DIAGNOSIS — M4322 Fusion of spine, cervical region: Secondary | ICD-10-CM | POA: Diagnosis not present

## 2018-03-31 ENCOUNTER — Other Ambulatory Visit: Payer: Self-pay | Admitting: Physician Assistant

## 2018-03-31 DIAGNOSIS — R1012 Left upper quadrant pain: Secondary | ICD-10-CM

## 2018-04-05 DIAGNOSIS — E1142 Type 2 diabetes mellitus with diabetic polyneuropathy: Secondary | ICD-10-CM | POA: Diagnosis not present

## 2018-04-05 DIAGNOSIS — E7800 Pure hypercholesterolemia, unspecified: Secondary | ICD-10-CM | POA: Diagnosis not present

## 2018-04-05 DIAGNOSIS — G609 Hereditary and idiopathic neuropathy, unspecified: Secondary | ICD-10-CM | POA: Diagnosis not present

## 2018-04-05 DIAGNOSIS — E049 Nontoxic goiter, unspecified: Secondary | ICD-10-CM | POA: Diagnosis not present

## 2018-04-05 DIAGNOSIS — E039 Hypothyroidism, unspecified: Secondary | ICD-10-CM | POA: Diagnosis not present

## 2018-04-05 DIAGNOSIS — N189 Chronic kidney disease, unspecified: Secondary | ICD-10-CM | POA: Diagnosis not present

## 2018-04-20 ENCOUNTER — Other Ambulatory Visit: Payer: Self-pay

## 2018-04-20 ENCOUNTER — Ambulatory Visit (INDEPENDENT_AMBULATORY_CARE_PROVIDER_SITE_OTHER): Payer: Medicare Other | Admitting: Gastroenterology

## 2018-04-20 DIAGNOSIS — K5900 Constipation, unspecified: Secondary | ICD-10-CM

## 2018-04-20 DIAGNOSIS — K219 Gastro-esophageal reflux disease without esophagitis: Secondary | ICD-10-CM | POA: Diagnosis not present

## 2018-04-20 DIAGNOSIS — K5732 Diverticulitis of large intestine without perforation or abscess without bleeding: Secondary | ICD-10-CM

## 2018-04-20 MED ORDER — AMOXICILLIN-POT CLAVULANATE 875-125 MG PO TABS: 1.0000 | ORAL_TABLET | Freq: Three times a day (TID) | ORAL | 0 refills | Status: AC

## 2018-04-20 NOTE — Progress Notes (Signed)
THIS ENCOUNTER IS A VIRTUAL VISIT DUE TO COVID-19 - PATIENT WAS NOT SEEN IN THE OFFICE. PATIENT HAS CONSENTED TO VIRTUAL VISIT / TELEMEDICINE VISIT   Location of patient: home Location of provider: office Name of referring provider:  Persons participating: myself, patient Time spent on call: 15 minutes  HPI :  73 y/o female with a history of diverticulitis, here for a follow up visit. She reports she is starting to have some symptoms concerning for "diverticulitis". She is taking hyocyamine and helping somewhat but still having some symptoms. She has been using Citrucel more recently. Having some pain in the R side which can radiate to the left side, often worse with eating. She has some discomfort with her bowel movements. This has been ongoing for about a week. No fevers. This feels like diverticulitis she has had in the past. She has had diverticulitis in the past, CT evidence of diverticulitis in the hepatic flexure in the past in 2017, but she has also had CT scan that have not showed diverticulitis with these symptoms. Her last CT was in 05/2017, she states she had been on antibiotics almost 10 days by the time the CT was done and that she had no symptoms when it was performed. She has responded to antibiotics in the past. She is feeling slightly constipated. She has Miralax at home.   She had an EGD on 07/19/2017 - she had empiric dilation with a mucosal wrent at the UES. She is taking omeprazole 20mg  q day, having some breakthrough more recently which is bothering her. Dysphagia not bothering her any more.  Last CT scan 05/2017 - no diverticulitis, large amount of stool in the colon - patient reports she had been on antibiotics for almost 10 days at the time.  She does not tolerance to flagyl "makes her sick to her stomach". She has allergies to bactrim   EGD 07/19/2017 -  esophagus normal. Dilation was performed in the entire esophagus with a Savary dilator with mild resistance at 16 mm and  17 mm. [facility] endoscopy showed an appropriate musocal wrent in the proximal esophagus / UES. A single 7 mm sessile polyp was found in the gastric body - removed via hot snare, benign.  Other smaller (<12mm) benign appearing gastric polyps noted but not removed. The exam of the stomach was otherwise normal. Biopsies negative for HP, duodenum was normal.   Colonoscopy 03/01/2015 - There were two sessile polyps in the sigmoid colon roughly 66mm in size each, and both were removed with cold forceps. There was mild pancolonic diverticulosis noted. The colonic mucosa was otherwise normal without inflammatory changes. Biopsies were taken from the right and left colon to rule out microscopic colitis. The terminal ileum was normal. The surgical anastosmosis in the sigmoid colon was widely patent and appeared normal. Retroflexed views revealed internal hemorrhoids.    Past Medical History:  Diagnosis Date  . Abdominal pain    EGD and CTs neg, rx Amitriptiline (non ulcer dyspepsia) EGD 12/11.. s/p ballon dilitation  . Anxiety   . Cervical spondylosis without myelopathy 11/30/2012  . Chronic conjunctivitis   . Diabetes mellitus    no meds, diet controlled  . Diverticulosis of colon   . GERD with stricture    s/p dilatations  . History of Lyme disease   . HOH (hard of hearing)    bilateral hearing aids  . Hyperlipidemia   . Hypothyroidism     Dx 10-9 TSH 5.2 , then TSH normalized, eventually  went up again 2012  . Melanoma ([place])    h/o  . Memory difficulties 06/05/2014  . Osteopenia   . Solar lentigo    face; s/p excision 3/09. See Derm q 6 months  . Trigeminal neuralgia    h/o      Past Surgical History:  Procedure Laterality Date  . ABDOMINAL HYSTERECTOMY    . ANKLE SURGERY  07/01/2015   R andkle tendon surgery  . BREAST BIOPSY Left 1988  . BREAST EXCISIONAL BIOPSY Left 1988  . CERVICAL FUSION  06/2017  . CHOLECYSTECTOMY    . COLON RESECTION  1999   d/t diverticulitis  . COLON SURGERY     . melanoma resection Right     Right Arm Surgery, R facial surgery (05-2012)   . PTX s/t chest tube    . SHOULDER SURGERY     right  . WRIST SURGERY     removed melaoma, right   Family History  Problem Relation Age of Onset  . Prostate cancer Father   . Endometrial cancer Sister        clear cell carcinoma  . Stroke Sister   . Heart attack Paternal Grandfather        GF in his 74s  . Melanoma Maternal Uncle   . Colon cancer Neg Hx   . Breast cancer Neg Hx   . Diabetes Neg Hx   . Stomach cancer Neg Hx   . Rectal cancer Neg Hx    Social History   Tobacco Use  . Smoking status: Former Smoker    Start date: 01/20/1976    Last attempt to quit: 1981    Years since quitting: 39.2  . Smokeless tobacco: Never Used  Substance Use Topics  . Alcohol use: Yes    Alcohol/week: 2.0 - 3.0 standard drinks    Types: 2 - 3 Glasses of wine per week  . Drug use: No   Current Outpatient Medications  Medication Sig Dispense Refill  . ALPRAZolam (XANAX) 0.5 MG tablet TAKE 1 TABLET(0.5 MG) BY MOUTH AT BEDTIME AS NEEDED FOR SLEEP 30 tablet 3  . amitriptyline (ELAVIL) 25 MG tablet Take 1 tablet (25 mg total) by mouth at bedtime. 90 tablet 3  . Cholecalciferol (VITAMIN D3) 2000 UNITS TABS Take 1 tablet by mouth daily.     . diclofenac sodium (VOLTAREN) 1 % GEL Apply topically 4 (four) times daily.    [name] gabapentin (NEURONTIN) 300 MG capsule Take 1 in the am 1 around 2pm and 2 at hs 120 capsule 6  . hyoscyamine (LEVSIN SL) 0.125 MG SL tablet DISSOLVE 1 TABLET(0.125 MG) UNDER THE TONGUE EVERY 6 HOURS AS NEEDED FOR CRAMPING 90 tablet 1  . LEVEMIR FLEXTOUCH 100 UNIT/ML Pen Inject 7 Units into the skin at bedtime.     [name] levothyroxine (SYNTHROID, LEVOTHROID) 50 MCG tablet TK 1 T PO QD  12  . Lifitegrast (XIIDRA) 5 % SOLN Apply to eye.    . Multiple Vitamin (MULTIVITAMIN) tablet Take 1 tablet by mouth daily.      [name] omeprazole (PRILOSEC) 20 MG capsule Take 1 capsule (20 mg total) by mouth 2 (two) times  daily before a meal. 180 capsule 1  . ONETOUCH VERIO test strip 2 (two) times daily. for testing  5  . Probiotic Product (PROBIOTIC PO) Take 1 tablet by mouth daily.    . repaglinide (PRANDIN) 0.5 MG tablet Take 0.5 mg by mouth daily.    . rosuvastatin (CRESTOR) 10 MG tablet  TAKE 1 TABLET(10 MG) BY MOUTH DAILY 90 tablet 1  . sucralfate (CARAFATE) 1 g tablet Take 1 tablet (1 g total) by mouth 4 (four) times daily -  with meals and at bedtime. 30 tablet 3  . triamcinolone (KENALOG) 0.1 % paste Use as directed 1 application in the mouth or throat 2 (two) times daily. 5 g 0  . valACYclovir (VALTREX) 1000 MG tablet TK 1 T PO Q 12 H FOR 1 WEEK    . valACYclovir (VALTREX) 1000 MG tablet Take 1 tablet (1,000 mg total) by mouth 3 (three) times daily. 21 tablet 0   No current facility-administered medications for this visit.    Allergies  Allergen Reactions  . Rocephin [Ceftriaxone] Anaphylaxis  . Prednisone Other (See Comments)    High blood sugars (pt reports)  . Other     VERY DELICATE SKIN FROM MELANOMA   . Benadryl [Diphenhydramine Hcl] Anxiety    Hyperactivity     Review of Systems: All systems reviewed and negative except where noted in HPI.   Lab Results  Component Value Date   WBC 6.4 05/17/2017   HGB 13.5 05/17/2017   HCT 40.0 05/17/2017   MCV 91.5 05/17/2017   PLT 224.0 05/17/2017    Lab Results  Component Value Date   CREATININE 0.84 11/03/2017   BUN 29 (H) 11/03/2017   NA 138 11/03/2017   K 4.1 11/03/2017   CL 100 11/03/2017   CO2 29 11/03/2017    Lab Results  Component Value Date   ALT 18 11/03/2017   AST 19 11/03/2017   ALKPHOS 83 11/03/2017   BILITOT 0.4 11/03/2017     Physical Exam: NA  ASSESSMENT AND PLAN:  73 y/o female here for reassessment of the following issues:  Possible diverticulitis / constipation - she has had diverticulitis on CT in the past, and also CT scan with symptoms without diverticulitis although her last exam was done after she  completed antibiotics and was asymptomatic. She feels symptoms are c/w prior diverticulitis symptoms and she reliably responds to antibiotics. Will give her a week course of Augmentin to treat empirically for this. I recommend she use Miralax for constipation and hyocyamine for her abdominal cramps. Once done with Augmentin she should take some florastor or a probiotics. She agreed.  GERD / dysphagia - her dysphagia resolved post dilation, she had a UES stricture. Otherwise reflux bothering her more so recently, she can try increasing omeprazole to 20mg  BID for a few weeks to gain control of symptoms and then titrate back down as tolerated.  Northbrook Cellar, MD [facility]

## 2018-04-28 ENCOUNTER — Other Ambulatory Visit: Payer: Self-pay | Admitting: Family Medicine

## 2018-05-06 ENCOUNTER — Encounter: Payer: Self-pay | Admitting: Family Medicine

## 2018-05-06 ENCOUNTER — Other Ambulatory Visit: Payer: Self-pay

## 2018-05-06 ENCOUNTER — Ambulatory Visit: Payer: Medicare Other | Admitting: Family Medicine

## 2018-05-06 ENCOUNTER — Ambulatory Visit (INDEPENDENT_AMBULATORY_CARE_PROVIDER_SITE_OTHER): Payer: Medicare Other | Admitting: Family Medicine

## 2018-05-06 VITALS — HR 99 | Ht 64.0 in | Wt 141.0 lb

## 2018-05-06 DIAGNOSIS — E1142 Type 2 diabetes mellitus with diabetic polyneuropathy: Secondary | ICD-10-CM

## 2018-05-06 DIAGNOSIS — E785 Hyperlipidemia, unspecified: Secondary | ICD-10-CM

## 2018-05-06 NOTE — Progress Notes (Signed)
I have discussed the procedure for the virtual visit with the patient who has given consent to proceed with assessment and treatment.   BETHANY DILLARD, CMA     

## 2018-05-06 NOTE — Progress Notes (Signed)
Virtual Visit via Video   I connected with patient on 05/06/18 at  8:40 AM EDT by a video enabled telemedicine application and verified that I am speaking with the correct person using two identifiers.  Location patient: Home Location provider: [facility], Office Persons participating in the virtual visit: Patient, Provider, [place] ([place])  I discussed the limitations of evaluation and management by telemedicine and the availability of in person appointments. The patient expressed understanding and agreed to proceed.  Subjective:   HPI:   Hyperlipidemia- chronic problem, on Crestor 10mg  daily.  No CP, SOB, HAs, visual changes, abd pain above baseline, N/V.  Walking regularly.  DM- chronic problem, following w/ Dr Chalmers Cater.  On Prandin and Levemir.  ROS:   See pertinent positives and negatives per HPI.  Patient Active Problem List   Diagnosis Date Noted  . Ingrown toenail without infection 04/11/2015  . Memory difficulties 06/05/2014  . Abdominal pain, chronic, epigastric 01/29/2014  . Dysphagia 01/29/2014  . History of esophageal stricture 01/29/2014  . Chronic conjunctivitis 02/07/2013  . Cervical spondylosis without myelopathy 11/30/2012  . Annual physical exam 03/18/2011  . Headache 02/02/2011  . NEURALGIA, TRIGEMINAL-- on Neurontin, Elavil, propranolol 03/13/2009  . Hypothyroidism 11/07/2007  . Type 2 diabetes mellitus with diabetic polyneuropathy ([place]) 02/14/2007  . Anxiety-insomnia-UDS 02/14/2007  . GERD 10/23/2006  . Hx of melanoma of skin 05/10/2006  . Hyperlipidemia 05/10/2006  . Diverticulitis of colon 05/10/2006  . Osteopenia 05/10/2006  . ESOPHAGEAL STRICTURE 02/16/2005    Social History   Tobacco Use  . Smoking status: Former Smoker    Start date: 01/20/1976    Last attempt to quit: 1981    Years since quitting: 39.3  . Smokeless tobacco: Never Used  Substance Use Topics  . Alcohol use: Yes    Alcohol/week: 2.0 - 3.0 standard drinks     Types: 2 - 3 Glasses of wine per week    Current Outpatient Medications:  .  ALPRAZolam (XANAX) 0.5 MG tablet, TAKE 1 TABLET(0.5 MG) BY MOUTH AT BEDTIME AS NEEDED FOR SLEEP, Disp: 30 tablet, Rfl: 3 .  amitriptyline (ELAVIL) 25 MG tablet, Take 1 tablet (25 mg total) by mouth at bedtime., Disp: 90 tablet, Rfl: 3 .  Cholecalciferol (VITAMIN D3) 2000 UNITS TABS, Take 1 tablet by mouth daily. , Disp: , Rfl:  .  diclofenac sodium (VOLTAREN) 1 % GEL, Apply topically 4 (four) times daily., Disp: , Rfl:  .  gabapentin (NEURONTIN) 300 MG capsule, Take 1 in the am 1 around 2pm and 2 at hs, Disp: 120 capsule, Rfl: 6 .  hyoscyamine (LEVSIN SL) 0.125 MG SL tablet, DISSOLVE 1 TABLET(0.125 MG) UNDER THE TONGUE EVERY 6 HOURS AS NEEDED FOR CRAMPING, Disp: 90 tablet, Rfl: 1 .  LEVEMIR FLEXTOUCH 100 UNIT/ML Pen, Inject 7 Units into the skin at bedtime. , Disp: , Rfl:  .  levothyroxine (SYNTHROID, LEVOTHROID) 50 MCG tablet, TK 1 T PO QD, Disp: , Rfl: 12 .  Lifitegrast (XIIDRA) 5 % SOLN, Apply to eye., Disp: , Rfl:  .  Multiple Vitamin (MULTIVITAMIN) tablet, Take 1 tablet by mouth daily.  , Disp: , Rfl:  .  omeprazole (PRILOSEC) 20 MG capsule, TAKE 1 CAPSULE(20 MG) BY MOUTH TWICE DAILY BEFORE A MEAL, Disp: 180 capsule, Rfl: 1 .  ONETOUCH VERIO test strip, 2 (two) times daily. for testing, Disp: , Rfl: 5 .  Probiotic Product (PROBIOTIC PO), Take 1 tablet by mouth daily., Disp: , Rfl:  .  repaglinide (PRANDIN)  0.5 MG tablet, Take 0.5 mg by mouth daily., Disp: , Rfl:  .  rosuvastatin (CRESTOR) 10 MG tablet, TAKE 1 TABLET(10 MG) BY MOUTH DAILY, Disp: 90 tablet, Rfl: 1 .  valACYclovir (VALTREX) 1000 MG tablet, TK 1 T PO Q 12 H FOR 1 WEEK, Disp: , Rfl:   Allergies  Allergen Reactions  . Rocephin [Ceftriaxone] Anaphylaxis  . Prednisone Other (See Comments)    High blood sugars (pt reports)  . Other     VERY DELICATE SKIN FROM MELANOMA   . Benadryl [Diphenhydramine Hcl] Anxiety    Hyperactivity    Objective:    Pulse 99   Ht 5\' 4"  (1.626 m)   Wt 141 lb (64 kg)   BMI 24.20 kg/m   AAOx3, NAD NCAT, EOMI No obvious CN deficits Coloring WNL Pt is able to speak clearly, coherently without shortness of breath or increased work of breathing.  Thought process is linear.  Mood is appropriate.   Assessment and Plan:   Hyperlipidemia- chronic problem, tolerating statin w/o difficulty.  Exercising regularly.  Applauded her efforts.  Check labs.  Adjust meds prn   DM- continues to follow w/ Dr Chalmers Cater.  Last A1C 7.2   Annye Asa, MD 05/06/2018

## 2018-05-09 ENCOUNTER — Encounter: Payer: Self-pay | Admitting: Family Medicine

## 2018-05-09 ENCOUNTER — Other Ambulatory Visit (INDEPENDENT_AMBULATORY_CARE_PROVIDER_SITE_OTHER): Payer: Medicare Other

## 2018-05-09 DIAGNOSIS — E785 Hyperlipidemia, unspecified: Secondary | ICD-10-CM

## 2018-05-09 LAB — HEPATIC FUNCTION PANEL
ALT: 17 U/L (ref 0–35)
AST: 20 U/L (ref 0–37)
Albumin: 4.4 g/dL (ref 3.5–5.2)
Alkaline Phosphatase: 89 U/L (ref 39–117)
Bilirubin, Direct: 0.1 mg/dL (ref 0.0–0.3)
Total Bilirubin: 0.4 mg/dL (ref 0.2–1.2)
Total Protein: 7 g/dL (ref 6.0–8.3)

## 2018-05-09 LAB — LIPID PANEL
Cholesterol: 198 mg/dL (ref 0–200)
HDL: 68.8 mg/dL (ref >39.00)
LDL Cholesterol: 113 mg/dL — ABNORMAL HIGH (ref 0–99)
NonHDL: 129.28
Total CHOL/HDL Ratio: 3
Triglycerides: 79 mg/dL (ref 0.0–149.0)
VLDL: 15.8 mg/dL (ref 0.0–40.0)

## 2018-05-09 LAB — BASIC METABOLIC PANEL
BUN: 26 mg/dL — ABNORMAL HIGH (ref 6–23)
CO2: 27 mEq/L (ref 19–32)
Calcium: 9.9 mg/dL (ref 8.4–10.5)
Chloride: 101 mEq/L (ref 96–112)
Creatinine, Ser: 0.93 mg/dL (ref 0.40–1.20)
GFR: 59.21 mL/min — ABNORMAL LOW (ref >60.00)
Glucose, Bld: 94 mg/dL (ref 70–99)
Potassium: 4.3 mEq/L (ref 3.5–5.1)
Sodium: 141 mEq/L (ref 135–145)

## 2018-05-10 ENCOUNTER — Telehealth: Payer: Self-pay | Admitting: Gastroenterology

## 2018-05-10 ENCOUNTER — Other Ambulatory Visit: Payer: Self-pay

## 2018-05-10 MED ORDER — AMOXICILLIN-POT CLAVULANATE 875-125 MG PO TABS: 1.0000 | ORAL_TABLET | Freq: Three times a day (TID) | ORAL | 0 refills | Status: DC

## 2018-05-10 NOTE — Telephone Encounter (Signed)
Pt states that sxs from her diverticulitis have worsened, she has been having upper right quadrant pain that radiates to her back, pain was so bad last night that she could not breath very well. She would like some advise.

## 2018-05-10 NOTE — Telephone Encounter (Signed)
Patient stated the Augmentin did help and when she stopped it the symptoms started back. I sent in a refill for it.

## 2018-05-10 NOTE — Telephone Encounter (Signed)
Patient called and states her symptoms of diverticulitis are worse with pain in her RUQ and it radiates to her back. Last  Night she could hardly breath because it hurt so bad.Please advise

## 2018-05-10 NOTE — Telephone Encounter (Signed)
Thanks for the note, sorry to hear about this. I have her Augmentin for one week on April 1. Did she feel it helped and did her symptoms go away, and the come back and get worse? If that is the case, we can give her another 10 day course of Augmentin. If the pain never went away and is worse, may need to consider repeat labs and some imaging. Can you let me know? Thanks

## 2018-05-10 NOTE — Telephone Encounter (Signed)
Okay thanks much 

## 2018-05-11 ENCOUNTER — Other Ambulatory Visit: Payer: Self-pay | Admitting: Family Medicine

## 2018-06-08 ENCOUNTER — Other Ambulatory Visit: Payer: Self-pay | Admitting: Family Medicine

## 2018-06-08 NOTE — Telephone Encounter (Signed)
Last OV 05/06/18 Alprazolam last filled 02/04/18 #30 with 3

## 2018-07-01 DIAGNOSIS — M4322 Fusion of spine, cervical region: Secondary | ICD-10-CM | POA: Diagnosis not present

## 2018-07-01 DIAGNOSIS — M542 Cervicalgia: Secondary | ICD-10-CM | POA: Diagnosis not present

## 2018-07-07 ENCOUNTER — Other Ambulatory Visit: Payer: Self-pay

## 2018-07-07 ENCOUNTER — Ambulatory Visit (HOSPITAL_COMMUNITY)
Admission: RE | Admit: 2018-07-07 | Discharge: 2018-07-07 | Disposition: A | Discharge status: Home / Self Care | Payer: Medicare Other | Source: Ambulatory Visit | Attending: Internal Medicine | Admitting: Internal Medicine

## 2018-07-07 ENCOUNTER — Other Ambulatory Visit (HOSPITAL_COMMUNITY): Payer: Self-pay | Admitting: Orthopedic Surgery

## 2018-07-07 DIAGNOSIS — M79604 Pain in right leg: Secondary | ICD-10-CM

## 2018-07-07 DIAGNOSIS — M5416 Radiculopathy, lumbar region: Secondary | ICD-10-CM | POA: Diagnosis not present

## 2018-07-07 DIAGNOSIS — M7671 Peroneal tendinitis, right leg: Secondary | ICD-10-CM | POA: Diagnosis not present

## 2018-07-07 DIAGNOSIS — M25551 Pain in right hip: Secondary | ICD-10-CM | POA: Diagnosis not present

## 2018-07-14 DIAGNOSIS — M545 Low back pain: Secondary | ICD-10-CM | POA: Diagnosis not present

## 2018-07-18 DIAGNOSIS — M5416 Radiculopathy, lumbar region: Secondary | ICD-10-CM | POA: Diagnosis not present

## 2018-07-18 DIAGNOSIS — M542 Cervicalgia: Secondary | ICD-10-CM | POA: Diagnosis not present

## 2018-07-18 DIAGNOSIS — M5431 Sciatica, right side: Secondary | ICD-10-CM | POA: Diagnosis not present

## 2018-07-18 DIAGNOSIS — M5126 Other intervertebral disc displacement, lumbar region: Secondary | ICD-10-CM | POA: Diagnosis not present

## 2018-07-18 DIAGNOSIS — M5136 Other intervertebral disc degeneration, lumbar region: Secondary | ICD-10-CM | POA: Diagnosis not present

## 2018-07-20 DIAGNOSIS — Z4689 Encounter for fitting and adjustment of other specified devices: Secondary | ICD-10-CM | POA: Diagnosis not present

## 2018-07-20 DIAGNOSIS — M5431 Sciatica, right side: Secondary | ICD-10-CM | POA: Diagnosis not present

## 2018-07-20 DIAGNOSIS — M5106 Intervertebral disc disorders with myelopathy, lumbar region: Secondary | ICD-10-CM | POA: Diagnosis not present

## 2018-07-20 DIAGNOSIS — M5416 Radiculopathy, lumbar region: Secondary | ICD-10-CM | POA: Diagnosis not present

## 2018-07-20 DIAGNOSIS — Z6824 Body mass index (BMI) 24.0-24.9, adult: Secondary | ICD-10-CM | POA: Diagnosis not present

## 2018-07-21 DIAGNOSIS — Z01812 Encounter for preprocedural laboratory examination: Secondary | ICD-10-CM | POA: Diagnosis not present

## 2018-07-21 DIAGNOSIS — M519 Unspecified thoracic, thoracolumbar and lumbosacral intervertebral disc disorder: Secondary | ICD-10-CM | POA: Diagnosis not present

## 2018-07-21 DIAGNOSIS — G729 Myopathy, unspecified: Secondary | ICD-10-CM | POA: Diagnosis not present

## 2018-07-21 DIAGNOSIS — Z1159 Encounter for screening for other viral diseases: Secondary | ICD-10-CM | POA: Diagnosis not present

## 2018-07-25 DIAGNOSIS — M5431 Sciatica, right side: Secondary | ICD-10-CM | POA: Diagnosis not present

## 2018-07-25 DIAGNOSIS — Z794 Long term (current) use of insulin: Secondary | ICD-10-CM | POA: Diagnosis not present

## 2018-07-25 DIAGNOSIS — M5416 Radiculopathy, lumbar region: Secondary | ICD-10-CM | POA: Diagnosis not present

## 2018-07-25 DIAGNOSIS — M5106 Intervertebral disc disorders with myelopathy, lumbar region: Secondary | ICD-10-CM | POA: Diagnosis not present

## 2018-07-25 DIAGNOSIS — M48061 Spinal stenosis, lumbar region without neurogenic claudication: Secondary | ICD-10-CM | POA: Diagnosis not present

## 2018-07-25 DIAGNOSIS — Z981 Arthrodesis status: Secondary | ICD-10-CM | POA: Diagnosis not present

## 2018-07-25 DIAGNOSIS — I517 Cardiomegaly: Secondary | ICD-10-CM | POA: Diagnosis not present

## 2018-07-25 DIAGNOSIS — M5126 Other intervertebral disc displacement, lumbar region: Secondary | ICD-10-CM | POA: Diagnosis not present

## 2018-07-25 DIAGNOSIS — Z79899 Other long term (current) drug therapy: Secondary | ICD-10-CM | POA: Diagnosis not present

## 2018-07-25 DIAGNOSIS — M5116 Intervertebral disc disorders with radiculopathy, lumbar region: Secondary | ICD-10-CM | POA: Diagnosis not present

## 2018-07-25 DIAGNOSIS — E1165 Type 2 diabetes mellitus with hyperglycemia: Secondary | ICD-10-CM | POA: Diagnosis not present

## 2018-08-10 ENCOUNTER — Other Ambulatory Visit: Payer: Self-pay | Admitting: Family Medicine

## 2018-08-10 NOTE — Telephone Encounter (Signed)
Last OV 05/06/18 Alprazolam last filled 06/09/18 #30 with 3

## 2018-08-11 DIAGNOSIS — E039 Hypothyroidism, unspecified: Secondary | ICD-10-CM | POA: Diagnosis not present

## 2018-08-11 DIAGNOSIS — E1142 Type 2 diabetes mellitus with diabetic polyneuropathy: Secondary | ICD-10-CM | POA: Diagnosis not present

## 2018-08-18 DIAGNOSIS — E7800 Pure hypercholesterolemia, unspecified: Secondary | ICD-10-CM | POA: Diagnosis not present

## 2018-08-18 DIAGNOSIS — E1142 Type 2 diabetes mellitus with diabetic polyneuropathy: Secondary | ICD-10-CM | POA: Diagnosis not present

## 2018-08-18 DIAGNOSIS — G609 Hereditary and idiopathic neuropathy, unspecified: Secondary | ICD-10-CM | POA: Diagnosis not present

## 2018-08-18 DIAGNOSIS — E039 Hypothyroidism, unspecified: Secondary | ICD-10-CM | POA: Diagnosis not present

## 2018-08-22 DIAGNOSIS — M545 Low back pain: Secondary | ICD-10-CM | POA: Diagnosis not present

## 2018-08-22 DIAGNOSIS — M5416 Radiculopathy, lumbar region: Secondary | ICD-10-CM | POA: Diagnosis not present

## 2018-08-25 ENCOUNTER — Other Ambulatory Visit: Payer: Self-pay | Admitting: Orthopaedic Surgery

## 2018-08-25 DIAGNOSIS — M5431 Sciatica, right side: Secondary | ICD-10-CM

## 2018-08-27 DIAGNOSIS — M5136 Other intervertebral disc degeneration, lumbar region: Secondary | ICD-10-CM | POA: Diagnosis not present

## 2018-08-27 DIAGNOSIS — M545 Low back pain: Secondary | ICD-10-CM | POA: Diagnosis not present

## 2018-08-29 DIAGNOSIS — M5431 Sciatica, right side: Secondary | ICD-10-CM | POA: Diagnosis not present

## 2018-08-29 DIAGNOSIS — M5106 Intervertebral disc disorders with myelopathy, lumbar region: Secondary | ICD-10-CM | POA: Diagnosis not present

## 2018-08-29 DIAGNOSIS — M545 Low back pain: Secondary | ICD-10-CM | POA: Diagnosis not present

## 2018-08-29 DIAGNOSIS — M5416 Radiculopathy, lumbar region: Secondary | ICD-10-CM | POA: Diagnosis not present

## 2018-09-09 DIAGNOSIS — M5431 Sciatica, right side: Secondary | ICD-10-CM | POA: Diagnosis not present

## 2018-09-13 DIAGNOSIS — Z01810 Encounter for preprocedural cardiovascular examination: Secondary | ICD-10-CM | POA: Diagnosis not present

## 2018-09-13 DIAGNOSIS — Z01812 Encounter for preprocedural laboratory examination: Secondary | ICD-10-CM | POA: Diagnosis not present

## 2018-09-13 DIAGNOSIS — Z20828 Contact with and (suspected) exposure to other viral communicable diseases: Secondary | ICD-10-CM | POA: Diagnosis not present

## 2018-09-13 DIAGNOSIS — M47812 Spondylosis without myelopathy or radiculopathy, cervical region: Secondary | ICD-10-CM | POA: Diagnosis not present

## 2018-09-20 ENCOUNTER — Other Ambulatory Visit: Payer: Self-pay

## 2018-09-20 DIAGNOSIS — G9619 Other disorders of meninges, not elsewhere classified: Secondary | ICD-10-CM | POA: Diagnosis present

## 2018-09-20 DIAGNOSIS — M4716 Other spondylosis with myelopathy, lumbar region: Secondary | ICD-10-CM | POA: Diagnosis not present

## 2018-09-20 DIAGNOSIS — E139 Other specified diabetes mellitus without complications: Secondary | ICD-10-CM | POA: Diagnosis present

## 2018-09-20 DIAGNOSIS — M5116 Intervertebral disc disorders with radiculopathy, lumbar region: Secondary | ICD-10-CM | POA: Diagnosis not present

## 2018-09-20 DIAGNOSIS — M961 Postlaminectomy syndrome, not elsewhere classified: Secondary | ICD-10-CM | POA: Diagnosis not present

## 2018-09-20 DIAGNOSIS — I10 Essential (primary) hypertension: Secondary | ICD-10-CM | POA: Diagnosis present

## 2018-09-20 DIAGNOSIS — Z981 Arthrodesis status: Secondary | ICD-10-CM | POA: Diagnosis not present

## 2018-09-20 DIAGNOSIS — G43909 Migraine, unspecified, not intractable, without status migrainosus: Secondary | ICD-10-CM | POA: Diagnosis present

## 2018-09-20 DIAGNOSIS — M4327 Fusion of spine, lumbosacral region: Secondary | ICD-10-CM | POA: Diagnosis not present

## 2018-09-20 DIAGNOSIS — M5117 Intervertebral disc disorders with radiculopathy, lumbosacral region: Secondary | ICD-10-CM | POA: Diagnosis present

## 2018-09-20 DIAGNOSIS — M5106 Intervertebral disc disorders with myelopathy, lumbar region: Secondary | ICD-10-CM | POA: Diagnosis not present

## 2018-09-20 DIAGNOSIS — M4807 Spinal stenosis, lumbosacral region: Secondary | ICD-10-CM | POA: Diagnosis present

## 2018-09-20 DIAGNOSIS — E785 Hyperlipidemia, unspecified: Secondary | ICD-10-CM | POA: Diagnosis present

## 2018-09-20 DIAGNOSIS — Z79899 Other long term (current) drug therapy: Secondary | ICD-10-CM | POA: Diagnosis not present

## 2018-09-20 DIAGNOSIS — E039 Hypothyroidism, unspecified: Secondary | ICD-10-CM | POA: Diagnosis present

## 2018-09-20 DIAGNOSIS — M48062 Spinal stenosis, lumbar region with neurogenic claudication: Secondary | ICD-10-CM | POA: Diagnosis not present

## 2018-09-20 DIAGNOSIS — M48061 Spinal stenosis, lumbar region without neurogenic claudication: Secondary | ICD-10-CM | POA: Diagnosis not present

## 2018-09-20 DIAGNOSIS — R42 Dizziness and giddiness: Secondary | ICD-10-CM | POA: Diagnosis not present

## 2018-09-20 DIAGNOSIS — R531 Weakness: Secondary | ICD-10-CM | POA: Diagnosis not present

## 2018-09-20 DIAGNOSIS — M5137 Other intervertebral disc degeneration, lumbosacral region: Secondary | ICD-10-CM | POA: Diagnosis not present

## 2018-09-20 DIAGNOSIS — M7918 Myalgia, other site: Secondary | ICD-10-CM | POA: Diagnosis not present

## 2018-09-20 MED ORDER — GABAPENTIN 300 MG PO CAPS: ORAL_CAPSULE | ORAL | 6 refills | Status: DC

## 2018-09-23 ENCOUNTER — Other Ambulatory Visit: Payer: Self-pay | Admitting: Family Medicine

## 2018-09-23 DIAGNOSIS — E039 Hypothyroidism, unspecified: Secondary | ICD-10-CM | POA: Diagnosis not present

## 2018-09-23 DIAGNOSIS — Z6824 Body mass index (BMI) 24.0-24.9, adult: Secondary | ICD-10-CM | POA: Diagnosis not present

## 2018-09-23 DIAGNOSIS — G500 Trigeminal neuralgia: Secondary | ICD-10-CM | POA: Diagnosis not present

## 2018-09-23 DIAGNOSIS — H01009 Unspecified blepharitis unspecified eye, unspecified eyelid: Secondary | ICD-10-CM | POA: Diagnosis not present

## 2018-09-23 DIAGNOSIS — H04129 Dry eye syndrome of unspecified lacrimal gland: Secondary | ICD-10-CM | POA: Diagnosis not present

## 2018-09-23 DIAGNOSIS — Z87891 Personal history of nicotine dependence: Secondary | ICD-10-CM | POA: Diagnosis not present

## 2018-09-23 DIAGNOSIS — E785 Hyperlipidemia, unspecified: Secondary | ICD-10-CM | POA: Diagnosis not present

## 2018-09-23 DIAGNOSIS — Z794 Long term (current) use of insulin: Secondary | ICD-10-CM | POA: Diagnosis not present

## 2018-09-23 DIAGNOSIS — F419 Anxiety disorder, unspecified: Secondary | ICD-10-CM | POA: Diagnosis not present

## 2018-09-23 DIAGNOSIS — Z85828 Personal history of other malignant neoplasm of skin: Secondary | ICD-10-CM | POA: Diagnosis not present

## 2018-09-23 DIAGNOSIS — M48061 Spinal stenosis, lumbar region without neurogenic claudication: Secondary | ICD-10-CM | POA: Diagnosis not present

## 2018-09-23 DIAGNOSIS — Z4789 Encounter for other orthopedic aftercare: Secondary | ICD-10-CM | POA: Diagnosis not present

## 2018-09-23 DIAGNOSIS — K219 Gastro-esophageal reflux disease without esophagitis: Secondary | ICD-10-CM | POA: Diagnosis not present

## 2018-09-23 DIAGNOSIS — K5790 Diverticulosis of intestine, part unspecified, without perforation or abscess without bleeding: Secondary | ICD-10-CM | POA: Diagnosis not present

## 2018-09-23 DIAGNOSIS — Z981 Arthrodesis status: Secondary | ICD-10-CM | POA: Diagnosis not present

## 2018-09-23 DIAGNOSIS — H2510 Age-related nuclear cataract, unspecified eye: Secondary | ICD-10-CM | POA: Diagnosis not present

## 2018-09-23 DIAGNOSIS — M5431 Sciatica, right side: Secondary | ICD-10-CM | POA: Diagnosis not present

## 2018-09-23 DIAGNOSIS — E1142 Type 2 diabetes mellitus with diabetic polyneuropathy: Secondary | ICD-10-CM | POA: Diagnosis not present

## 2018-09-23 DIAGNOSIS — M50023 Cervical disc disorder at C6-C7 level with myelopathy: Secondary | ICD-10-CM | POA: Diagnosis not present

## 2018-09-27 ENCOUNTER — Telehealth: Payer: Self-pay

## 2018-09-27 NOTE — Telephone Encounter (Signed)
Patient recently had back surgery so she has been r/s for 02/23/2019 @ 9am but she stated that she would like to be seen at the end of October or in November. Please advise.

## 2018-09-28 DIAGNOSIS — Z4789 Encounter for other orthopedic aftercare: Secondary | ICD-10-CM | POA: Diagnosis not present

## 2018-09-28 DIAGNOSIS — M5431 Sciatica, right side: Secondary | ICD-10-CM | POA: Diagnosis not present

## 2018-09-28 DIAGNOSIS — E1142 Type 2 diabetes mellitus with diabetic polyneuropathy: Secondary | ICD-10-CM | POA: Diagnosis not present

## 2018-09-28 DIAGNOSIS — M48061 Spinal stenosis, lumbar region without neurogenic claudication: Secondary | ICD-10-CM | POA: Diagnosis not present

## 2018-09-28 DIAGNOSIS — G500 Trigeminal neuralgia: Secondary | ICD-10-CM | POA: Diagnosis not present

## 2018-09-28 DIAGNOSIS — M50023 Cervical disc disorder at C6-C7 level with myelopathy: Secondary | ICD-10-CM | POA: Diagnosis not present

## 2018-09-28 NOTE — Telephone Encounter (Signed)
I contacted the pt and we were able to schedule f/u for 11/15/2018 at 12 pm check in time of 11:30 am.

## 2018-09-30 DIAGNOSIS — M48061 Spinal stenosis, lumbar region without neurogenic claudication: Secondary | ICD-10-CM | POA: Diagnosis not present

## 2018-09-30 DIAGNOSIS — M5431 Sciatica, right side: Secondary | ICD-10-CM | POA: Diagnosis not present

## 2018-09-30 DIAGNOSIS — Z4789 Encounter for other orthopedic aftercare: Secondary | ICD-10-CM | POA: Diagnosis not present

## 2018-09-30 DIAGNOSIS — G500 Trigeminal neuralgia: Secondary | ICD-10-CM | POA: Diagnosis not present

## 2018-09-30 DIAGNOSIS — E1142 Type 2 diabetes mellitus with diabetic polyneuropathy: Secondary | ICD-10-CM | POA: Diagnosis not present

## 2018-09-30 DIAGNOSIS — M50023 Cervical disc disorder at C6-C7 level with myelopathy: Secondary | ICD-10-CM | POA: Diagnosis not present

## 2018-10-03 ENCOUNTER — Ambulatory Visit: Payer: Medicare Other | Admitting: Neurology

## 2018-10-03 DIAGNOSIS — M5431 Sciatica, right side: Secondary | ICD-10-CM | POA: Diagnosis not present

## 2018-10-03 DIAGNOSIS — M50023 Cervical disc disorder at C6-C7 level with myelopathy: Secondary | ICD-10-CM | POA: Diagnosis not present

## 2018-10-03 DIAGNOSIS — M48061 Spinal stenosis, lumbar region without neurogenic claudication: Secondary | ICD-10-CM | POA: Diagnosis not present

## 2018-10-03 DIAGNOSIS — E1142 Type 2 diabetes mellitus with diabetic polyneuropathy: Secondary | ICD-10-CM | POA: Diagnosis not present

## 2018-10-03 DIAGNOSIS — G500 Trigeminal neuralgia: Secondary | ICD-10-CM | POA: Diagnosis not present

## 2018-10-03 DIAGNOSIS — Z4789 Encounter for other orthopedic aftercare: Secondary | ICD-10-CM | POA: Diagnosis not present

## 2018-10-05 ENCOUNTER — Other Ambulatory Visit: Payer: Self-pay | Admitting: General Practice

## 2018-10-05 DIAGNOSIS — Z4789 Encounter for other orthopedic aftercare: Secondary | ICD-10-CM | POA: Diagnosis not present

## 2018-10-05 DIAGNOSIS — M50023 Cervical disc disorder at C6-C7 level with myelopathy: Secondary | ICD-10-CM | POA: Diagnosis not present

## 2018-10-05 DIAGNOSIS — M48061 Spinal stenosis, lumbar region without neurogenic claudication: Secondary | ICD-10-CM | POA: Diagnosis not present

## 2018-10-05 DIAGNOSIS — M5431 Sciatica, right side: Secondary | ICD-10-CM | POA: Diagnosis not present

## 2018-10-05 MED ORDER — ALPRAZOLAM 0.5 MG PO TABS: ORAL_TABLET | ORAL | 3 refills | Status: DC

## 2018-10-05 NOTE — Telephone Encounter (Signed)
Last OV 07/14/18 Alprazolam last filled 06/09/18 #30 with 3

## 2018-10-07 DIAGNOSIS — E1142 Type 2 diabetes mellitus with diabetic polyneuropathy: Secondary | ICD-10-CM | POA: Diagnosis not present

## 2018-10-07 DIAGNOSIS — M50023 Cervical disc disorder at C6-C7 level with myelopathy: Secondary | ICD-10-CM | POA: Diagnosis not present

## 2018-10-07 DIAGNOSIS — G500 Trigeminal neuralgia: Secondary | ICD-10-CM | POA: Diagnosis not present

## 2018-10-07 DIAGNOSIS — M5431 Sciatica, right side: Secondary | ICD-10-CM | POA: Diagnosis not present

## 2018-10-07 DIAGNOSIS — M48061 Spinal stenosis, lumbar region without neurogenic claudication: Secondary | ICD-10-CM | POA: Diagnosis not present

## 2018-10-07 DIAGNOSIS — Z4789 Encounter for other orthopedic aftercare: Secondary | ICD-10-CM | POA: Diagnosis not present

## 2018-10-10 DIAGNOSIS — G500 Trigeminal neuralgia: Secondary | ICD-10-CM | POA: Diagnosis not present

## 2018-10-10 DIAGNOSIS — Z4789 Encounter for other orthopedic aftercare: Secondary | ICD-10-CM | POA: Diagnosis not present

## 2018-10-10 DIAGNOSIS — E1142 Type 2 diabetes mellitus with diabetic polyneuropathy: Secondary | ICD-10-CM | POA: Diagnosis not present

## 2018-10-10 DIAGNOSIS — M50023 Cervical disc disorder at C6-C7 level with myelopathy: Secondary | ICD-10-CM | POA: Diagnosis not present

## 2018-10-10 DIAGNOSIS — M48061 Spinal stenosis, lumbar region without neurogenic claudication: Secondary | ICD-10-CM | POA: Diagnosis not present

## 2018-10-10 DIAGNOSIS — M5431 Sciatica, right side: Secondary | ICD-10-CM | POA: Diagnosis not present

## 2018-10-13 DIAGNOSIS — M48061 Spinal stenosis, lumbar region without neurogenic claudication: Secondary | ICD-10-CM | POA: Diagnosis not present

## 2018-10-13 DIAGNOSIS — M5431 Sciatica, right side: Secondary | ICD-10-CM | POA: Diagnosis not present

## 2018-10-13 DIAGNOSIS — E1142 Type 2 diabetes mellitus with diabetic polyneuropathy: Secondary | ICD-10-CM | POA: Diagnosis not present

## 2018-10-13 DIAGNOSIS — M50023 Cervical disc disorder at C6-C7 level with myelopathy: Secondary | ICD-10-CM | POA: Diagnosis not present

## 2018-10-13 DIAGNOSIS — Z4789 Encounter for other orthopedic aftercare: Secondary | ICD-10-CM | POA: Diagnosis not present

## 2018-10-13 DIAGNOSIS — G500 Trigeminal neuralgia: Secondary | ICD-10-CM | POA: Diagnosis not present

## 2018-10-18 DIAGNOSIS — G500 Trigeminal neuralgia: Secondary | ICD-10-CM | POA: Diagnosis not present

## 2018-10-18 DIAGNOSIS — M50023 Cervical disc disorder at C6-C7 level with myelopathy: Secondary | ICD-10-CM | POA: Diagnosis not present

## 2018-10-18 DIAGNOSIS — Z4789 Encounter for other orthopedic aftercare: Secondary | ICD-10-CM | POA: Diagnosis not present

## 2018-10-18 DIAGNOSIS — M5431 Sciatica, right side: Secondary | ICD-10-CM | POA: Diagnosis not present

## 2018-10-18 DIAGNOSIS — E1142 Type 2 diabetes mellitus with diabetic polyneuropathy: Secondary | ICD-10-CM | POA: Diagnosis not present

## 2018-10-18 DIAGNOSIS — M48061 Spinal stenosis, lumbar region without neurogenic claudication: Secondary | ICD-10-CM | POA: Diagnosis not present

## 2018-10-20 DIAGNOSIS — M5431 Sciatica, right side: Secondary | ICD-10-CM | POA: Diagnosis not present

## 2018-10-20 DIAGNOSIS — M50023 Cervical disc disorder at C6-C7 level with myelopathy: Secondary | ICD-10-CM | POA: Diagnosis not present

## 2018-10-20 DIAGNOSIS — G500 Trigeminal neuralgia: Secondary | ICD-10-CM | POA: Diagnosis not present

## 2018-10-20 DIAGNOSIS — E1142 Type 2 diabetes mellitus with diabetic polyneuropathy: Secondary | ICD-10-CM | POA: Diagnosis not present

## 2018-10-20 DIAGNOSIS — Z4789 Encounter for other orthopedic aftercare: Secondary | ICD-10-CM | POA: Diagnosis not present

## 2018-10-20 DIAGNOSIS — M48061 Spinal stenosis, lumbar region without neurogenic claudication: Secondary | ICD-10-CM | POA: Diagnosis not present

## 2018-10-21 DIAGNOSIS — M4327 Fusion of spine, lumbosacral region: Secondary | ICD-10-CM | POA: Diagnosis not present

## 2018-10-23 DIAGNOSIS — Z981 Arthrodesis status: Secondary | ICD-10-CM | POA: Diagnosis not present

## 2018-10-23 DIAGNOSIS — Z85828 Personal history of other malignant neoplasm of skin: Secondary | ICD-10-CM | POA: Diagnosis not present

## 2018-10-23 DIAGNOSIS — Z794 Long term (current) use of insulin: Secondary | ICD-10-CM | POA: Diagnosis not present

## 2018-10-23 DIAGNOSIS — H2510 Age-related nuclear cataract, unspecified eye: Secondary | ICD-10-CM | POA: Diagnosis not present

## 2018-10-23 DIAGNOSIS — K219 Gastro-esophageal reflux disease without esophagitis: Secondary | ICD-10-CM | POA: Diagnosis not present

## 2018-10-23 DIAGNOSIS — M5431 Sciatica, right side: Secondary | ICD-10-CM | POA: Diagnosis not present

## 2018-10-23 DIAGNOSIS — M48061 Spinal stenosis, lumbar region without neurogenic claudication: Secondary | ICD-10-CM | POA: Diagnosis not present

## 2018-10-23 DIAGNOSIS — G500 Trigeminal neuralgia: Secondary | ICD-10-CM | POA: Diagnosis not present

## 2018-10-23 DIAGNOSIS — Z4789 Encounter for other orthopedic aftercare: Secondary | ICD-10-CM | POA: Diagnosis not present

## 2018-10-23 DIAGNOSIS — Z87891 Personal history of nicotine dependence: Secondary | ICD-10-CM | POA: Diagnosis not present

## 2018-10-23 DIAGNOSIS — Z6824 Body mass index (BMI) 24.0-24.9, adult: Secondary | ICD-10-CM | POA: Diagnosis not present

## 2018-10-23 DIAGNOSIS — K5790 Diverticulosis of intestine, part unspecified, without perforation or abscess without bleeding: Secondary | ICD-10-CM | POA: Diagnosis not present

## 2018-10-23 DIAGNOSIS — E1142 Type 2 diabetes mellitus with diabetic polyneuropathy: Secondary | ICD-10-CM | POA: Diagnosis not present

## 2018-10-23 DIAGNOSIS — H01009 Unspecified blepharitis unspecified eye, unspecified eyelid: Secondary | ICD-10-CM | POA: Diagnosis not present

## 2018-10-23 DIAGNOSIS — E785 Hyperlipidemia, unspecified: Secondary | ICD-10-CM | POA: Diagnosis not present

## 2018-10-23 DIAGNOSIS — M50023 Cervical disc disorder at C6-C7 level with myelopathy: Secondary | ICD-10-CM | POA: Diagnosis not present

## 2018-10-23 DIAGNOSIS — E039 Hypothyroidism, unspecified: Secondary | ICD-10-CM | POA: Diagnosis not present

## 2018-10-23 DIAGNOSIS — F419 Anxiety disorder, unspecified: Secondary | ICD-10-CM | POA: Diagnosis not present

## 2018-10-23 DIAGNOSIS — H04129 Dry eye syndrome of unspecified lacrimal gland: Secondary | ICD-10-CM | POA: Diagnosis not present

## 2018-10-25 DIAGNOSIS — E1142 Type 2 diabetes mellitus with diabetic polyneuropathy: Secondary | ICD-10-CM | POA: Diagnosis not present

## 2018-10-25 DIAGNOSIS — G500 Trigeminal neuralgia: Secondary | ICD-10-CM | POA: Diagnosis not present

## 2018-10-25 DIAGNOSIS — M48061 Spinal stenosis, lumbar region without neurogenic claudication: Secondary | ICD-10-CM | POA: Diagnosis not present

## 2018-10-25 DIAGNOSIS — M5431 Sciatica, right side: Secondary | ICD-10-CM | POA: Diagnosis not present

## 2018-10-25 DIAGNOSIS — Z4789 Encounter for other orthopedic aftercare: Secondary | ICD-10-CM | POA: Diagnosis not present

## 2018-10-25 DIAGNOSIS — M50023 Cervical disc disorder at C6-C7 level with myelopathy: Secondary | ICD-10-CM | POA: Diagnosis not present

## 2018-10-28 DIAGNOSIS — M5431 Sciatica, right side: Secondary | ICD-10-CM | POA: Diagnosis not present

## 2018-10-28 DIAGNOSIS — Z4789 Encounter for other orthopedic aftercare: Secondary | ICD-10-CM | POA: Diagnosis not present

## 2018-10-28 DIAGNOSIS — M50023 Cervical disc disorder at C6-C7 level with myelopathy: Secondary | ICD-10-CM | POA: Diagnosis not present

## 2018-10-28 DIAGNOSIS — M48061 Spinal stenosis, lumbar region without neurogenic claudication: Secondary | ICD-10-CM | POA: Diagnosis not present

## 2018-10-28 DIAGNOSIS — E1142 Type 2 diabetes mellitus with diabetic polyneuropathy: Secondary | ICD-10-CM | POA: Diagnosis not present

## 2018-10-28 DIAGNOSIS — G500 Trigeminal neuralgia: Secondary | ICD-10-CM | POA: Diagnosis not present

## 2018-10-31 DIAGNOSIS — G500 Trigeminal neuralgia: Secondary | ICD-10-CM | POA: Diagnosis not present

## 2018-10-31 DIAGNOSIS — M5431 Sciatica, right side: Secondary | ICD-10-CM | POA: Diagnosis not present

## 2018-10-31 DIAGNOSIS — M50023 Cervical disc disorder at C6-C7 level with myelopathy: Secondary | ICD-10-CM | POA: Diagnosis not present

## 2018-10-31 DIAGNOSIS — Z4789 Encounter for other orthopedic aftercare: Secondary | ICD-10-CM | POA: Diagnosis not present

## 2018-10-31 DIAGNOSIS — M48061 Spinal stenosis, lumbar region without neurogenic claudication: Secondary | ICD-10-CM | POA: Diagnosis not present

## 2018-10-31 DIAGNOSIS — E1142 Type 2 diabetes mellitus with diabetic polyneuropathy: Secondary | ICD-10-CM | POA: Diagnosis not present

## 2018-11-02 DIAGNOSIS — Z4789 Encounter for other orthopedic aftercare: Secondary | ICD-10-CM | POA: Diagnosis not present

## 2018-11-02 DIAGNOSIS — M50023 Cervical disc disorder at C6-C7 level with myelopathy: Secondary | ICD-10-CM | POA: Diagnosis not present

## 2018-11-02 DIAGNOSIS — G500 Trigeminal neuralgia: Secondary | ICD-10-CM | POA: Diagnosis not present

## 2018-11-02 DIAGNOSIS — E1142 Type 2 diabetes mellitus with diabetic polyneuropathy: Secondary | ICD-10-CM | POA: Diagnosis not present

## 2018-11-02 DIAGNOSIS — M48061 Spinal stenosis, lumbar region without neurogenic claudication: Secondary | ICD-10-CM | POA: Diagnosis not present

## 2018-11-02 DIAGNOSIS — M5431 Sciatica, right side: Secondary | ICD-10-CM | POA: Diagnosis not present

## 2018-11-07 DIAGNOSIS — G500 Trigeminal neuralgia: Secondary | ICD-10-CM | POA: Diagnosis not present

## 2018-11-07 DIAGNOSIS — E1142 Type 2 diabetes mellitus with diabetic polyneuropathy: Secondary | ICD-10-CM | POA: Diagnosis not present

## 2018-11-07 DIAGNOSIS — M50023 Cervical disc disorder at C6-C7 level with myelopathy: Secondary | ICD-10-CM | POA: Diagnosis not present

## 2018-11-07 DIAGNOSIS — M48061 Spinal stenosis, lumbar region without neurogenic claudication: Secondary | ICD-10-CM | POA: Diagnosis not present

## 2018-11-07 DIAGNOSIS — M5431 Sciatica, right side: Secondary | ICD-10-CM | POA: Diagnosis not present

## 2018-11-07 DIAGNOSIS — Z4789 Encounter for other orthopedic aftercare: Secondary | ICD-10-CM | POA: Diagnosis not present

## 2018-11-08 ENCOUNTER — Other Ambulatory Visit: Payer: Self-pay | Admitting: General Practice

## 2018-11-08 MED ORDER — OMEPRAZOLE 20 MG PO CPDR: DELAYED_RELEASE_CAPSULE | ORAL | 1 refills | Status: DC

## 2018-11-15 ENCOUNTER — Other Ambulatory Visit: Payer: Self-pay | Admitting: General Practice

## 2018-11-15 ENCOUNTER — Ambulatory Visit (INDEPENDENT_AMBULATORY_CARE_PROVIDER_SITE_OTHER): Payer: Medicare Other | Admitting: Neurology

## 2018-11-15 ENCOUNTER — Encounter: Payer: Self-pay | Admitting: Neurology

## 2018-11-15 ENCOUNTER — Other Ambulatory Visit: Payer: Self-pay

## 2018-11-15 VITALS — BP 138/76 | HR 86 | Temp 97.8°F | Ht 64.5 in | Wt 138.0 lb

## 2018-11-15 DIAGNOSIS — G500 Trigeminal neuralgia: Secondary | ICD-10-CM | POA: Diagnosis not present

## 2018-11-15 DIAGNOSIS — M47812 Spondylosis without myelopathy or radiculopathy, cervical region: Secondary | ICD-10-CM | POA: Diagnosis not present

## 2018-11-15 MED ORDER — ROSUVASTATIN CALCIUM 10 MG PO TABS: 10.0000 mg | ORAL_TABLET | Freq: Every day | ORAL | 1 refills | Status: DC

## 2018-11-15 NOTE — Progress Notes (Signed)
Reason for visit: Headache, neck pain  [name] [name] is an 73 y.o. female  History of present illness:  [name] [name] is a 73 year old right-handed white female with a history of a headache syndrome that is associated with sharp jabbing pain coming up from the right side of the neck into the right frontotemporal area.  This may occur on average once or twice a week.  The patient is on amitriptyline and gabapentin, she was on propranolol but stopped this medication.  She has diabetes and is currently on insulin, she does have a peripheral neuropathy associated with this.  She recently fell in May 2020 and injured her low back which required 2 surgical procedures, one on 25 July 2018 and the second on 20 September 2018.  The patient had severe sciatica on involving the right leg with weakness of the right leg.  She is now getting over her second surgery, she is in physical therapy.  She is wearing a low back brace.  She at some point wishes to come off of the amitriptyline but she is still having a lot of back pain and muscle spasm.  She remains on gabapentin, she tolerates this fairly well.  Past Medical History:  Diagnosis Date  . Abdominal pain    EGD and CTs neg, rx Amitriptiline (non ulcer dyspepsia) EGD 12/11.. s/p ballon dilitation  . Anxiety   . Cervical spondylosis without myelopathy 11/30/2012  . Chronic conjunctivitis   . Diabetes mellitus    no meds, diet controlled  . Diverticulosis of colon   . GERD with stricture    s/p dilatations  . History of Lyme disease   . HOH (hard of hearing)    bilateral hearing aids  . Hyperlipidemia   . Hypothyroidism     Dx 10-9 TSH 5.2 , then TSH normalized, eventually went up again 2012  . Melanoma ([place])    h/o  . Memory difficulties 06/05/2014  . Osteopenia   . Solar lentigo    face; s/p excision 3/09. See Derm q 6 months  . Trigeminal neuralgia    h/o     Past Surgical History:  Procedure Laterality Date  . ABDOMINAL HYSTERECTOMY     . ANKLE SURGERY  07/01/2015   R andkle tendon surgery  . BREAST BIOPSY Left 1988  . BREAST EXCISIONAL BIOPSY Left 1988  . CERVICAL FUSION  06/2017  . CHOLECYSTECTOMY    . COLON RESECTION  1999   d/t diverticulitis  . COLON SURGERY    . melanoma resection Right     Right Arm Surgery, R facial surgery (05-2012)   . PTX s/t chest tube    . SHOULDER SURGERY     right  . WRIST SURGERY     removed melaoma, right    Family History  Problem Relation Age of Onset  . Prostate cancer Father   . Endometrial cancer Sister        clear cell carcinoma  . Stroke Sister   . Heart attack Paternal Grandfather        GF in his 36s  . Melanoma Maternal Uncle   . Colon cancer Neg Hx   . Breast cancer Neg Hx   . Diabetes Neg Hx   . Stomach cancer Neg Hx   . Rectal cancer Neg Hx     Social history:  reports that she quit smoking about 39 years ago. She started smoking about 42 years ago. She has never used smokeless tobacco. She  reports current alcohol use of about 2.0 - 3.0 standard drinks of alcohol per week. She reports that she does not use drugs.    Allergies  Allergen Reactions  . Rocephin [Ceftriaxone] Anaphylaxis  . Prednisone Other (See Comments)    High blood sugars (pt reports)  . Other     VERY DELICATE SKIN FROM MELANOMA   . Benadryl [Diphenhydramine Hcl] Anxiety    Hyperactivity    Medications:  Prior to Admission medications   Medication Sig Start Date End Date Taking? Authorizing Provider  ALPRAZolam (XANAX) 0.5 MG tablet TAKE 1 TABLET(0.5 MG) BY MOUTH AT BEDTIME AS NEEDED FOR SLEEP 10/05/18  Yes Midge Minium, MD  amitriptyline (ELAVIL) 25 MG tablet Take 1 tablet (25 mg total) by mouth at bedtime. 03/08/18  Yes Dennie Bible, NP  Cholecalciferol (VITAMIN D3) 2000 UNITS TABS Take 1 tablet by mouth daily.    Yes [provider]  diclofenac sodium (VOLTAREN) 1 % GEL Apply topically 4 (four) times daily.   Yes [provider]  gabapentin  (NEURONTIN) 300 MG capsule Take 1 in the am 1 around 2pm and 2 at hs 09/20/18  Yes Kathrynn Ducking, MD  hyoscyamine (LEVSIN SL) 0.125 MG SL tablet DISSOLVE 1 TABLET(0.125 MG) UNDER THE TONGUE EVERY 6 HOURS AS NEEDED FOR CRAMPING 04/01/18  Yes Midge Minium, MD  LEVEMIR FLEXTOUCH 100 UNIT/ML Pen Inject 7 Units into the skin at bedtime.  02/23/18  Yes [provider]  levothyroxine (SYNTHROID, LEVOTHROID) 50 MCG tablet TK 1 T PO QD 03/02/16  Yes [provider]  Lifitegrast Shirley Friar) 5 % SOLN Apply to eye.   Yes [provider]  Multiple Vitamin (MULTIVITAMIN) tablet Take 1 tablet by mouth daily.     Yes [provider]  omeprazole (PRILOSEC) 20 MG capsule TAKE 1 CAPSULE(20 MG) BY MOUTH TWICE DAILY BEFORE A MEAL 11/08/18  Yes Midge Minium, MD  [facility] VERIO test strip 2 (two) times daily. for testing 10/12/17  Yes [provider]  Probiotic Product (PROBIOTIC PO) Take 1 tablet by mouth daily.   Yes [provider]  rosuvastatin (CRESTOR) 10 MG tablet TAKE 1 TABLET(10 MG) BY MOUTH DAILY 05/12/18  Yes Midge Minium, MD  valACYclovir (VALTREX) 1000 MG tablet TK 1 T PO Q 12 H FOR 1 WEEK 03/10/18  Yes [provider]  repaglinide (PRANDIN) 0.5 MG tablet Take 0.5 mg by mouth daily.    [provider]    ROS:  Out of a complete 14 system review of symptoms, the patient complains only of the following symptoms, and all other reviewed systems are negative.  Low back pain Walking difficulty Neck pain Headache  Blood pressure 138/76, pulse 86, temperature 97.8 F (36.6 C), temperature source Temporal, height 5' 4.5" (1.638 m), weight 138 lb (62.6 kg).  Physical Exam  General: The patient is alert and cooperative at the time of the examination.  Neuromuscular: The patient has surprisingly full range of movement of the cervical spine.  Skin: No significant peripheral edema is noted.   Neurologic Exam  Mental  status: The patient is alert and oriented x 3 at the time of the examination. The patient has apparent normal recent and remote memory, with an apparently normal attention span and concentration ability.   Cranial nerves: Facial symmetry is present. Speech is normal, no aphasia or dysarthria is noted. Extraocular movements are full. Visual fields are full.  Motor: The patient has good strength in all  4 extremities.  Sensory examination: Soft touch sensation is symmetric on the face, arms, and legs.  Coordination: The patient has good finger-nose-finger and heel-to-shin bilaterally.  Gait and station: The patient has a slightly wide-based gait, the patient is using a cane for ambulation.  Tandem gait was not attempted.  Romberg is negative.  Reflexes: Deep tendon reflexes are symmetric.   Assessment/Plan:  1.  Right frontotemporal headache  2.  Low back pain, recent lumbosacral spine surgery  The patient is recovering from lumbar spine surgery.  She will continue gabapentin and amitriptyline for now.  At some point the future she would like to come off of the amitriptyline which we can do with a very slow gradual taper.  She will continue these medications now, she will follow-up in 1 year or sooner if needed.  Jill Alexanders MD 11/15/2018 11:58 AM  [facility] [address] [place], [place] [address]  Phone [phone] Fax [phone]

## 2018-11-17 DIAGNOSIS — D1801 Hemangioma of skin and subcutaneous tissue: Secondary | ICD-10-CM | POA: Diagnosis not present

## 2018-11-17 DIAGNOSIS — L821 Other seborrheic keratosis: Secondary | ICD-10-CM | POA: Diagnosis not present

## 2018-11-17 DIAGNOSIS — L570 Actinic keratosis: Secondary | ICD-10-CM | POA: Diagnosis not present

## 2018-11-17 DIAGNOSIS — D225 Melanocytic nevi of trunk: Secondary | ICD-10-CM | POA: Diagnosis not present

## 2018-11-18 DIAGNOSIS — Z4789 Encounter for other orthopedic aftercare: Secondary | ICD-10-CM | POA: Diagnosis not present

## 2018-11-18 DIAGNOSIS — M50023 Cervical disc disorder at C6-C7 level with myelopathy: Secondary | ICD-10-CM | POA: Diagnosis not present

## 2018-11-18 DIAGNOSIS — G500 Trigeminal neuralgia: Secondary | ICD-10-CM | POA: Diagnosis not present

## 2018-11-18 DIAGNOSIS — M48061 Spinal stenosis, lumbar region without neurogenic claudication: Secondary | ICD-10-CM | POA: Diagnosis not present

## 2018-11-18 DIAGNOSIS — E1142 Type 2 diabetes mellitus with diabetic polyneuropathy: Secondary | ICD-10-CM | POA: Diagnosis not present

## 2018-11-18 DIAGNOSIS — M5431 Sciatica, right side: Secondary | ICD-10-CM | POA: Diagnosis not present

## 2018-11-22 DIAGNOSIS — E785 Hyperlipidemia, unspecified: Secondary | ICD-10-CM | POA: Diagnosis not present

## 2018-11-22 DIAGNOSIS — M48061 Spinal stenosis, lumbar region without neurogenic claudication: Secondary | ICD-10-CM | POA: Diagnosis not present

## 2018-11-22 DIAGNOSIS — H04129 Dry eye syndrome of unspecified lacrimal gland: Secondary | ICD-10-CM | POA: Diagnosis not present

## 2018-11-22 DIAGNOSIS — F419 Anxiety disorder, unspecified: Secondary | ICD-10-CM | POA: Diagnosis not present

## 2018-11-22 DIAGNOSIS — G500 Trigeminal neuralgia: Secondary | ICD-10-CM | POA: Diagnosis not present

## 2018-11-22 DIAGNOSIS — H01009 Unspecified blepharitis unspecified eye, unspecified eyelid: Secondary | ICD-10-CM | POA: Diagnosis not present

## 2018-11-22 DIAGNOSIS — E039 Hypothyroidism, unspecified: Secondary | ICD-10-CM | POA: Diagnosis not present

## 2018-11-22 DIAGNOSIS — Z87891 Personal history of nicotine dependence: Secondary | ICD-10-CM | POA: Diagnosis not present

## 2018-11-22 DIAGNOSIS — K219 Gastro-esophageal reflux disease without esophagitis: Secondary | ICD-10-CM | POA: Diagnosis not present

## 2018-11-22 DIAGNOSIS — K5790 Diverticulosis of intestine, part unspecified, without perforation or abscess without bleeding: Secondary | ICD-10-CM | POA: Diagnosis not present

## 2018-11-22 DIAGNOSIS — Z4789 Encounter for other orthopedic aftercare: Secondary | ICD-10-CM | POA: Diagnosis not present

## 2018-11-22 DIAGNOSIS — E1142 Type 2 diabetes mellitus with diabetic polyneuropathy: Secondary | ICD-10-CM | POA: Diagnosis not present

## 2018-11-22 DIAGNOSIS — H2510 Age-related nuclear cataract, unspecified eye: Secondary | ICD-10-CM | POA: Diagnosis not present

## 2018-11-22 DIAGNOSIS — M50023 Cervical disc disorder at C6-C7 level with myelopathy: Secondary | ICD-10-CM | POA: Diagnosis not present

## 2018-11-22 DIAGNOSIS — M5431 Sciatica, right side: Secondary | ICD-10-CM | POA: Diagnosis not present

## 2018-11-22 DIAGNOSIS — Z85828 Personal history of other malignant neoplasm of skin: Secondary | ICD-10-CM | POA: Diagnosis not present

## 2018-11-22 DIAGNOSIS — Z794 Long term (current) use of insulin: Secondary | ICD-10-CM | POA: Diagnosis not present

## 2018-11-22 DIAGNOSIS — Z6824 Body mass index (BMI) 24.0-24.9, adult: Secondary | ICD-10-CM | POA: Diagnosis not present

## 2018-11-25 DIAGNOSIS — Z4789 Encounter for other orthopedic aftercare: Secondary | ICD-10-CM | POA: Diagnosis not present

## 2018-11-25 DIAGNOSIS — G500 Trigeminal neuralgia: Secondary | ICD-10-CM | POA: Diagnosis not present

## 2018-11-25 DIAGNOSIS — M5431 Sciatica, right side: Secondary | ICD-10-CM | POA: Diagnosis not present

## 2018-11-25 DIAGNOSIS — M48061 Spinal stenosis, lumbar region without neurogenic claudication: Secondary | ICD-10-CM | POA: Diagnosis not present

## 2018-11-25 DIAGNOSIS — M50023 Cervical disc disorder at C6-C7 level with myelopathy: Secondary | ICD-10-CM | POA: Diagnosis not present

## 2018-11-25 DIAGNOSIS — E1142 Type 2 diabetes mellitus with diabetic polyneuropathy: Secondary | ICD-10-CM | POA: Diagnosis not present

## 2018-11-30 DIAGNOSIS — M545 Low back pain: Secondary | ICD-10-CM | POA: Diagnosis not present

## 2018-11-30 DIAGNOSIS — M5416 Radiculopathy, lumbar region: Secondary | ICD-10-CM | POA: Diagnosis not present

## 2018-11-30 DIAGNOSIS — M4326 Fusion of spine, lumbar region: Secondary | ICD-10-CM | POA: Diagnosis not present

## 2018-11-30 DIAGNOSIS — M48061 Spinal stenosis, lumbar region without neurogenic claudication: Secondary | ICD-10-CM | POA: Diagnosis not present

## 2018-11-30 DIAGNOSIS — M50023 Cervical disc disorder at C6-C7 level with myelopathy: Secondary | ICD-10-CM | POA: Diagnosis not present

## 2018-11-30 DIAGNOSIS — Z4789 Encounter for other orthopedic aftercare: Secondary | ICD-10-CM | POA: Diagnosis not present

## 2018-11-30 DIAGNOSIS — M5431 Sciatica, right side: Secondary | ICD-10-CM | POA: Diagnosis not present

## 2018-12-02 DIAGNOSIS — M5431 Sciatica, right side: Secondary | ICD-10-CM | POA: Diagnosis not present

## 2018-12-02 DIAGNOSIS — E1142 Type 2 diabetes mellitus with diabetic polyneuropathy: Secondary | ICD-10-CM | POA: Diagnosis not present

## 2018-12-02 DIAGNOSIS — Z4789 Encounter for other orthopedic aftercare: Secondary | ICD-10-CM | POA: Diagnosis not present

## 2018-12-02 DIAGNOSIS — M48061 Spinal stenosis, lumbar region without neurogenic claudication: Secondary | ICD-10-CM | POA: Diagnosis not present

## 2018-12-02 DIAGNOSIS — M50023 Cervical disc disorder at C6-C7 level with myelopathy: Secondary | ICD-10-CM | POA: Diagnosis not present

## 2018-12-02 DIAGNOSIS — G500 Trigeminal neuralgia: Secondary | ICD-10-CM | POA: Diagnosis not present

## 2018-12-07 DIAGNOSIS — M48061 Spinal stenosis, lumbar region without neurogenic claudication: Secondary | ICD-10-CM | POA: Diagnosis not present

## 2018-12-07 DIAGNOSIS — Z4789 Encounter for other orthopedic aftercare: Secondary | ICD-10-CM | POA: Diagnosis not present

## 2018-12-07 DIAGNOSIS — M50023 Cervical disc disorder at C6-C7 level with myelopathy: Secondary | ICD-10-CM | POA: Diagnosis not present

## 2018-12-07 DIAGNOSIS — E1142 Type 2 diabetes mellitus with diabetic polyneuropathy: Secondary | ICD-10-CM | POA: Diagnosis not present

## 2018-12-07 DIAGNOSIS — M5431 Sciatica, right side: Secondary | ICD-10-CM | POA: Diagnosis not present

## 2018-12-07 DIAGNOSIS — G500 Trigeminal neuralgia: Secondary | ICD-10-CM | POA: Diagnosis not present

## 2018-12-12 DIAGNOSIS — M48061 Spinal stenosis, lumbar region without neurogenic claudication: Secondary | ICD-10-CM | POA: Diagnosis not present

## 2018-12-12 DIAGNOSIS — G500 Trigeminal neuralgia: Secondary | ICD-10-CM | POA: Diagnosis not present

## 2018-12-12 DIAGNOSIS — M5431 Sciatica, right side: Secondary | ICD-10-CM | POA: Diagnosis not present

## 2018-12-12 DIAGNOSIS — Z4789 Encounter for other orthopedic aftercare: Secondary | ICD-10-CM | POA: Diagnosis not present

## 2018-12-12 DIAGNOSIS — E1142 Type 2 diabetes mellitus with diabetic polyneuropathy: Secondary | ICD-10-CM | POA: Diagnosis not present

## 2018-12-12 DIAGNOSIS — M50023 Cervical disc disorder at C6-C7 level with myelopathy: Secondary | ICD-10-CM | POA: Diagnosis not present

## 2018-12-19 DIAGNOSIS — G609 Hereditary and idiopathic neuropathy, unspecified: Secondary | ICD-10-CM | POA: Diagnosis not present

## 2018-12-19 DIAGNOSIS — Z23 Encounter for immunization: Secondary | ICD-10-CM | POA: Diagnosis not present

## 2018-12-19 DIAGNOSIS — E049 Nontoxic goiter, unspecified: Secondary | ICD-10-CM | POA: Diagnosis not present

## 2018-12-19 DIAGNOSIS — E039 Hypothyroidism, unspecified: Secondary | ICD-10-CM | POA: Diagnosis not present

## 2018-12-19 DIAGNOSIS — E1142 Type 2 diabetes mellitus with diabetic polyneuropathy: Secondary | ICD-10-CM | POA: Diagnosis not present

## 2018-12-19 DIAGNOSIS — E7800 Pure hypercholesterolemia, unspecified: Secondary | ICD-10-CM | POA: Diagnosis not present

## 2018-12-19 DIAGNOSIS — N189 Chronic kidney disease, unspecified: Secondary | ICD-10-CM | POA: Diagnosis not present

## 2018-12-19 LAB — HEMOGLOBIN A1C: Hemoglobin A1C: 8.3

## 2018-12-21 ENCOUNTER — Encounter: Payer: Self-pay | Admitting: General Practice

## 2018-12-21 DIAGNOSIS — M4326 Fusion of spine, lumbar region: Secondary | ICD-10-CM | POA: Diagnosis not present

## 2018-12-21 DIAGNOSIS — Z6822 Body mass index (BMI) 22.0-22.9, adult: Secondary | ICD-10-CM | POA: Diagnosis not present

## 2018-12-22 DIAGNOSIS — Z6824 Body mass index (BMI) 24.0-24.9, adult: Secondary | ICD-10-CM | POA: Diagnosis not present

## 2018-12-22 DIAGNOSIS — K5790 Diverticulosis of intestine, part unspecified, without perforation or abscess without bleeding: Secondary | ICD-10-CM | POA: Diagnosis not present

## 2018-12-22 DIAGNOSIS — F419 Anxiety disorder, unspecified: Secondary | ICD-10-CM | POA: Diagnosis not present

## 2018-12-22 DIAGNOSIS — Z4789 Encounter for other orthopedic aftercare: Secondary | ICD-10-CM | POA: Diagnosis not present

## 2018-12-22 DIAGNOSIS — H2510 Age-related nuclear cataract, unspecified eye: Secondary | ICD-10-CM | POA: Diagnosis not present

## 2018-12-22 DIAGNOSIS — E1142 Type 2 diabetes mellitus with diabetic polyneuropathy: Secondary | ICD-10-CM | POA: Diagnosis not present

## 2018-12-22 DIAGNOSIS — G500 Trigeminal neuralgia: Secondary | ICD-10-CM | POA: Diagnosis not present

## 2018-12-22 DIAGNOSIS — E039 Hypothyroidism, unspecified: Secondary | ICD-10-CM | POA: Diagnosis not present

## 2018-12-22 DIAGNOSIS — Z794 Long term (current) use of insulin: Secondary | ICD-10-CM | POA: Diagnosis not present

## 2018-12-22 DIAGNOSIS — H04129 Dry eye syndrome of unspecified lacrimal gland: Secondary | ICD-10-CM | POA: Diagnosis not present

## 2018-12-22 DIAGNOSIS — M48061 Spinal stenosis, lumbar region without neurogenic claudication: Secondary | ICD-10-CM | POA: Diagnosis not present

## 2018-12-22 DIAGNOSIS — Z85828 Personal history of other malignant neoplasm of skin: Secondary | ICD-10-CM | POA: Diagnosis not present

## 2018-12-22 DIAGNOSIS — Z87891 Personal history of nicotine dependence: Secondary | ICD-10-CM | POA: Diagnosis not present

## 2018-12-22 DIAGNOSIS — K219 Gastro-esophageal reflux disease without esophagitis: Secondary | ICD-10-CM | POA: Diagnosis not present

## 2018-12-22 DIAGNOSIS — M5431 Sciatica, right side: Secondary | ICD-10-CM | POA: Diagnosis not present

## 2018-12-22 DIAGNOSIS — H01009 Unspecified blepharitis unspecified eye, unspecified eyelid: Secondary | ICD-10-CM | POA: Diagnosis not present

## 2018-12-22 DIAGNOSIS — M50023 Cervical disc disorder at C6-C7 level with myelopathy: Secondary | ICD-10-CM | POA: Diagnosis not present

## 2018-12-22 DIAGNOSIS — E785 Hyperlipidemia, unspecified: Secondary | ICD-10-CM | POA: Diagnosis not present

## 2018-12-25 DIAGNOSIS — Z4789 Encounter for other orthopedic aftercare: Secondary | ICD-10-CM | POA: Diagnosis not present

## 2018-12-25 DIAGNOSIS — E1142 Type 2 diabetes mellitus with diabetic polyneuropathy: Secondary | ICD-10-CM | POA: Diagnosis not present

## 2018-12-25 DIAGNOSIS — M48061 Spinal stenosis, lumbar region without neurogenic claudication: Secondary | ICD-10-CM | POA: Diagnosis not present

## 2018-12-25 DIAGNOSIS — G500 Trigeminal neuralgia: Secondary | ICD-10-CM | POA: Diagnosis not present

## 2018-12-25 DIAGNOSIS — M50023 Cervical disc disorder at C6-C7 level with myelopathy: Secondary | ICD-10-CM | POA: Diagnosis not present

## 2018-12-25 DIAGNOSIS — M5431 Sciatica, right side: Secondary | ICD-10-CM | POA: Diagnosis not present

## 2019-01-04 ENCOUNTER — Encounter: Payer: Self-pay | Admitting: Gastroenterology

## 2019-01-04 ENCOUNTER — Other Ambulatory Visit: Payer: Self-pay

## 2019-01-04 ENCOUNTER — Ambulatory Visit (INDEPENDENT_AMBULATORY_CARE_PROVIDER_SITE_OTHER): Payer: Medicare Other

## 2019-01-04 ENCOUNTER — Ambulatory Visit (INDEPENDENT_AMBULATORY_CARE_PROVIDER_SITE_OTHER): Payer: Medicare Other | Admitting: Gastroenterology

## 2019-01-04 VITALS — BP 118/64 | HR 84 | Temp 97.6°F | Ht 64.0 in | Wt 137.0 lb

## 2019-01-04 DIAGNOSIS — Z1159 Encounter for screening for other viral diseases: Secondary | ICD-10-CM | POA: Diagnosis not present

## 2019-01-04 DIAGNOSIS — K219 Gastro-esophageal reflux disease without esophagitis: Secondary | ICD-10-CM | POA: Diagnosis not present

## 2019-01-04 DIAGNOSIS — R1310 Dysphagia, unspecified: Secondary | ICD-10-CM | POA: Diagnosis not present

## 2019-01-04 MED ORDER — SUCRALFATE 1 GM/10ML PO SUSP: 1.0000 g | Freq: Four times a day (QID) | ORAL | 1 refills | Status: DC | PRN

## 2019-01-04 MED ORDER — PANTOPRAZOLE SODIUM 40 MG PO TBEC: 40.0000 mg | DELAYED_RELEASE_TABLET | Freq: Every day | ORAL | 1 refills | Status: DC

## 2019-01-04 NOTE — Progress Notes (Signed)
HPI :  73 year old female here for follow-up visit.  I know her for history of diverticulitis, constipation, history of reflux and dysphagia.  She has been dealing with reflux for years.  She has been on a variety of PPIs in the past, most recent years she has been on omeprazole dosed at 20 mg twice a day.  She states at higher doses the omeprazole makes her stomach hurt and she does not tolerate higher dose.  For the most part this is been working okay however in recent months her reflux symptoms has gotten much worse.  She has significant pyrosis and regurgitation that bother her as well as some epigastric discomfort.  Can be worsened after eating or when lying down.  Further she is noted dysphagia to solids which has recurred in her throat area.  She has been evaluated for this in the past.  She is had an EGD in July 2019 which showed no obvious stenosis but had empiric dilation with a mucosal rent at the UES which was thought to be the likely cause of her symptoms.  She had significant relief after the dilation which lasted for well over a year.  Unfortunately she had a fall in May, led to a surgery of her back in July, she had recurrent symptoms which led to another surgery in September.  She has been recovering from that but doing much better and denies much pain.  She is not taking any narcotics.  She is utilizing Tylenol only for pain.  Otherwise her bowel functions been much better.  No recurrence of diverticulitis since have seen her.  She is using MiraLAX as needed.  Last CT scan 05/2017 - no diverticulitis, large amount of stool in the colon - patient reports she had been on antibiotics for almost 10 days at the time.   EGD 07/19/2017 -  esophagus normal. Dilation was performed in the entire esophagus with a Savary dilator with mild resistance at 16 mm and 17 mm. Relook endoscopy showed an appropriate musocal wrent in the proximal esophagus / UES. A single 7 mm sessile polyp was found in the  gastric body - removed via hot snare, benign.  Other smaller (<24mm) benign appearing gastric polyps noted but not removed. The exam of the stomach was otherwise normal. Biopsies negative for HP, duodenum was normal.  Colonoscopy 03/01/2015 - There were two sessile polyps in the sigmoid colon roughly 74mm in size each, and both were removed with cold forceps. There was mild pancolonic diverticulosis noted. The colonic mucosa was otherwise normal without inflammatory changes. Biopsies were taken from the right and left colon to rule out microscopic colitis. The terminal ileum was normal. The surgical anastosmosis in the sigmoid colon was widely patent and appeared normal. Retroflexed views revealed internal hemorrhoids.     Past Medical History:  Diagnosis Date  . Abdominal pain    EGD and CTs neg, rx Amitriptiline (non ulcer dyspepsia) EGD 12/11.. s/p ballon dilitation  . Anxiety   . Cervical spondylosis without myelopathy 11/30/2012  . Chronic conjunctivitis   . Diabetes mellitus    no meds, diet controlled  . Diverticulosis of colon   . GERD with stricture    s/p dilatations  . History of Lyme disease   . HOH (hard of hearing)    bilateral hearing aids  . Hyperlipidemia   . Hypothyroidism     Dx 10-9 TSH 5.2 , then TSH normalized, eventually went up again 2012  . Melanoma ([place])  h/o  . Memory difficulties 06/05/2014  . Osteopenia   . Solar lentigo    face; s/p excision 3/09. See Derm q 6 months  . Trigeminal neuralgia    h/o      Past Surgical History:  Procedure Laterality Date  . ABDOMINAL HYSTERECTOMY    . ANKLE SURGERY  07/01/2015   R andkle tendon surgery  . BREAST BIOPSY Left 1988  . BREAST EXCISIONAL BIOPSY Left 1988  . CERVICAL FUSION  06/2017  . CHOLECYSTECTOMY    . COLON RESECTION  1999   d/t diverticulitis  . COLON SURGERY    . melanoma resection Right     Right Arm Surgery, R facial surgery (05-2012)   . PTX s/t chest tube    . SHOULDER SURGERY      right  . WRIST SURGERY     removed melaoma, right   Family History  Problem Relation Age of Onset  . Prostate cancer Father   . Endometrial cancer Sister        clear cell carcinoma  . Stroke Sister   . Heart attack Paternal Grandfather        GF in his 48s  . Melanoma Maternal Uncle   . Colon cancer Neg Hx   . Breast cancer Neg Hx   . Diabetes Neg Hx   . Stomach cancer Neg Hx   . Rectal cancer Neg Hx    Social History   Tobacco Use  . Smoking status: Former Smoker    Start date: 01/20/1976    Quit date: 1981    Years since quitting: 39.9  . Smokeless tobacco: Never Used  Substance Use Topics  . Alcohol use: Yes    Alcohol/week: 2.0 - 3.0 standard drinks    Types: 2 - 3 Glasses of wine per week  . Drug use: No   Current Outpatient Medications  Medication Sig Dispense Refill  . ALPRAZolam (XANAX) 0.5 MG tablet TAKE 1 TABLET(0.5 MG) BY MOUTH AT BEDTIME AS NEEDED FOR SLEEP 30 tablet 3  . amitriptyline (ELAVIL) 25 MG tablet Take 1 tablet (25 mg total) by mouth at bedtime. 90 tablet 3  . Cholecalciferol (VITAMIN D3) 2000 UNITS TABS Take 1 tablet by mouth daily.     . diclofenac sodium (VOLTAREN) 1 % GEL Apply topically 4 (four) times daily.    [name] gabapentin (NEURONTIN) 300 MG capsule Take 1 in the am 1 around 2pm and 2 at hs 120 capsule 6  . hyoscyamine (LEVSIN SL) 0.125 MG SL tablet DISSOLVE 1 TABLET(0.125 MG) UNDER THE TONGUE EVERY 6 HOURS AS NEEDED FOR CRAMPING 90 tablet 1  . LEVEMIR FLEXTOUCH 100 UNIT/ML Pen Inject 6 Units into the skin at bedtime.     [name] levothyroxine (SYNTHROID, LEVOTHROID) 50 MCG tablet TK 1 T PO QD  12  . Lifitegrast (XIIDRA) 5 % SOLN Apply to eye.    . Multiple Vitamin (MULTIVITAMIN) tablet Take 1 tablet by mouth daily.      [name] omeprazole (PRILOSEC) 20 MG capsule TAKE 1 CAPSULE(20 MG) BY MOUTH TWICE DAILY BEFORE A MEAL 180 capsule 1  . ONETOUCH VERIO test strip 2 (two) times daily. for testing  5  . Probiotic Product (PROBIOTIC PO) Take 1 tablet by mouth  daily.    . rosuvastatin (CRESTOR) 10 MG tablet Take 1 tablet (10 mg total) by mouth daily. 90 tablet 1   No current facility-administered medications for this visit.   Allergies  Allergen Reactions  . Rocephin [Ceftriaxone] Anaphylaxis  .  Prednisone Other (See Comments)    High blood sugars (pt reports)  . Other     VERY DELICATE SKIN FROM MELANOMA   . Benadryl [Diphenhydramine Hcl] Anxiety    Hyperactivity     Review of Systems: All systems reviewed and negative except where noted in HPI.   Lab Results  Component Value Date   WBC 6.4 05/17/2017   HGB 13.5 05/17/2017   HCT 40.0 05/17/2017   MCV 91.5 05/17/2017   PLT 224.0 05/17/2017    Lab Results  Component Value Date   CREATININE 0.93 05/09/2018   BUN 26 (H) 05/09/2018   NA 141 05/09/2018   K 4.3 05/09/2018   CL 101 05/09/2018   CO2 27 05/09/2018    Lab Results  Component Value Date   ALT 17 05/09/2018   AST 20 05/09/2018   ALKPHOS 89 05/09/2018   BILITOT 0.4 05/09/2018     Physical Exam: BP 118/64   Pulse 84   Temp 97.6 F (36.4 C)   Ht 5\' 4"  (1.626 m)   Wt 137 lb (62.1 kg)   BMI 23.52 kg/m  Constitutional: Pleasant,well-developed, female in no acute distress. HEENT: Normocephalic and atraumatic. Conjunctivae are normal. No scleral icterus. Neck supple.  Cardiovascular: Normal rate, regular rhythm.  Pulmonary/chest: Effort normal and breath sounds normal. No wheezing, rales or rhonchi. Abdominal: Soft, nondistended, nontender.  There are no masses palpable. No hepatomegaly. Extremities: no edema Lymphadenopathy: No cervical adenopathy noted. Neurological: Alert and oriented to person place and time. Skin: Skin is warm and dry. No rashes noted. Psychiatric: Normal mood and affect. Behavior is normal.   ASSESSMENT AND PLAN: 73 year old female here for reassessment of the following issues.  GERD / dysphagia - longstanding reflux which has led to longstanding use of PPIs to control symptoms.   Currently symptoms are poorly controlled with frequent breakthrough despite omeprazole 20 mg twice a day.  Unfortunately at higher doses of omeprazole she has had abdominal pain with this for unclear reasons.  Otherwise she is having recurrent dysphagia, suspect due to UES stenosis for which she has had dilated in the past with very good results.  Symptoms have progressed and now having much more difficulty with eating.  We discussed options.  Her reflux is poorly controlled and recommend escalation of therapy, I am going to switch her to Protonix 40 mg twice a day and see if she tolerates this better than omeprazole.  I will also add liquid Carafate, 10 cc p.o. every 6 hours to see if this will help reduce her symptoms.  We discussed long-term risks and benefits of chronic PPI use.  Ultimately I would like to get her on a lower dose in light of some of these risks however due to symptoms we need to escalate dose right now.  Long-term may consider an evaluation for TIF if that is something she is interested in and we discussed this for a bit, however will await her course on higher dose PPI for now.  Otherwise regarding her dysphagia, I offered her another EGD with dilation if this is helped her in the past.  She strongly wanted to proceed with this given her worsening dysphagia and difficulty eating.  I discussed risks and benefits of endoscopy and anesthesia with her and she wants to proceed.  Further recommendations pending the results.  She agreed  Fortuna Cellar, MD [facility]

## 2019-01-04 NOTE — Patient Instructions (Signed)
We have sent the following medications to your pharmacy for you to pick up at your convenience: protonix and carafate.  You have been scheduled for an endoscopy. Please follow written instructions given to you at your visit today. If you use inhalers (even only as needed), please bring them with you on the day of your procedure. Your physician has requested that you go to www.[email or web address].com and enter the access code given to you at your visit today. This web site gives a general overview about your procedure. However, you should still follow specific instructions given to you by our office regarding your preparation for the procedure.

## 2019-01-05 LAB — SARS CORONAVIRUS 2 (TAT 6-24 HRS): SARS Coronavirus 2: NEGATIVE

## 2019-01-06 ENCOUNTER — Ambulatory Visit (AMBULATORY_SURGERY_CENTER): Payer: Medicare Other | Admitting: Gastroenterology

## 2019-01-06 ENCOUNTER — Encounter: Payer: Self-pay | Admitting: Gastroenterology

## 2019-01-06 ENCOUNTER — Other Ambulatory Visit: Payer: Self-pay

## 2019-01-06 VITALS — BP 116/59 | HR 85 | Temp 98.4°F | Resp 22 | Ht 64.0 in | Wt 137.0 lb

## 2019-01-06 DIAGNOSIS — K449 Diaphragmatic hernia without obstruction or gangrene: Secondary | ICD-10-CM

## 2019-01-06 DIAGNOSIS — K219 Gastro-esophageal reflux disease without esophagitis: Secondary | ICD-10-CM | POA: Diagnosis not present

## 2019-01-06 DIAGNOSIS — R1310 Dysphagia, unspecified: Secondary | ICD-10-CM | POA: Diagnosis present

## 2019-01-06 DIAGNOSIS — K222 Esophageal obstruction: Secondary | ICD-10-CM | POA: Diagnosis not present

## 2019-01-06 MED ORDER — SODIUM CHLORIDE 0.9 % IV SOLN: 500.0000 mL | Freq: Once | INTRAVENOUS | Status: DC

## 2019-01-06 NOTE — Op Note (Signed)
[facility] Patient Name: [name] [name] Procedure Date: 01/06/2019 10:48 AM MRN: [id number] Endoscopist: Remo Lipps P. Vedder Brittian , MD Age: 73 Referring MD:  Date of Birth: [birth year] Gender: Female Account #: [id number] Procedure:                Upper GI endoscopy Indications:              Dysphagia - history of UES stenosis s/p dilation in                            the past with benefit, Follow-up of                            gastro-esophageal reflux disease - poorly                            controlled on omeprazole 20mg  BID, recently                            switched to protonix 40mg  twice daily and carafate                            as needed with benefit Medicines:                Monitored Anesthesia Care Procedure:                Pre-Anesthesia Assessment:                           - Prior to the procedure, a History and Physical                            was performed, and patient medications and                            allergies were reviewed. The patient's tolerance of                            previous anesthesia was also reviewed. The risks                            and benefits of the procedure and the sedation                            options and risks were discussed with the patient.                            All questions were answered, and informed consent                            was obtained. Prior Anticoagulants: The patient has                            taken no previous anticoagulant or antiplatelet  agents. ASA Grade Assessment: II - A patient with                            mild systemic disease. After reviewing the risks                            and benefits, the patient was deemed in                            satisfactory condition to undergo the procedure.                           After obtaining informed consent, the endoscope was                            passed under direct vision. Throughout the                             procedure, the patient's blood pressure, pulse, and                            oxygen saturations were monitored continuously. The                            Endoscope was introduced through the mouth, and                            advanced to the second part of duodenum. The upper                            GI endoscopy was accomplished without difficulty.                            The patient tolerated the procedure well. Scope In: Scope Out: Findings:                 Esophagogastric landmarks were identified: the                            Z-line was found at 36 cm, the gastroesophageal                            junction was found at 36 cm and the upper extent of                            the gastric folds was found at 37 cm from the                            incisors.                           A 1 cm hiatal hernia was present.  The examined esophagus was tortuous.                           There was a subtle stenosis at the UES. The exam of                            the esophagus was otherwise normal. No esophagitis                            or other strictures appreciated.                           A guidewire was placed and the scope was withdrawn.                            Dilation was performed in the entire esophagus with                            a Savary dilator with mild resistance at 16 mm and                            moderate resistance at 17 mm. Relook endoscopy                            showed small heme at the UES.                           A few small sessile polyps were found in the                            gastric fundus and in the gastric body. Not sampled                            on this exam, benign polyps previously sampled on                            last exam.                           The exam of the stomach was otherwise normal. Hill                            grade I views of the cardia  noted.                           The duodenal bulb and second portion of the                            duodenum were normal. Complications:            No immediate complications. Estimated blood loss:                            Minimal. Estimated Blood Loss:  Estimated blood loss was minimal. Impression:               - Esophagogastric landmarks identified.                           - 1 cm hiatal hernia.                           - Tortuous esophagus.                           - Subtle UES stenosis - dilated to 73mm                           - Normal esophagus otherwise                           - A few benign appearing gastric polyps - not                            sampled given this was done at the last exam and                            benign.                           - Normal stomach otherwise                           - Normal duodenal bulb and second portion of the                            duodenum. Recommendation:           - Patient has a contact number available for                            emergencies. The signs and symptoms of potential                            delayed complications were discussed with the                            patient. Return to normal activities tomorrow.                            Written discharge instructions were provided to the                            patient.                           - Post dilation diet.                           - Continue present medications - protonix 40mg   twice daily + carafate                           - Would consider barium swallow to assess motility                            to see component of this playing into symptoms.                            Patient has had intolerance of higher doses of PPIs                            in the past, will see how she does with protonix.                            She is a candidate for TIF for treatment of                             nonerosive reflux if symptoms persist Remo Lipps P. Ahlani Wickes, MD 01/06/2019 11:14:17 AM This report has been signed electronically.

## 2019-01-06 NOTE — Patient Instructions (Signed)
Continue to take [place]! You may need TIF TO CORRECT THIS PROBLEM. Please follow post dilation diet today.  That means nothing until 12:10pm, then clear liquids until 1:10pm, then soft foods for the rest of today.      YOU HAD AN ENDOSCOPIC PROCEDURE TODAY AT [facility]:   Refer to the procedure report that was given to you for any specific questions about what was found during the examination.  If the procedure report does not answer your questions, please call your gastroenterologist to clarify.  If you requested that your care partner not be given the details of your procedure findings, then the procedure report has been included in a sealed envelope for you to review at your convenience later.  YOU SHOULD EXPECT: Some feelings of bloating in the abdomen. Passage of more gas than usual.  Walking can help get rid of the air that was put into your GI tract during the procedure and reduce the bloating. If you had a lower endoscopy (such as a colonoscopy or flexible sigmoidoscopy) you may notice spotting of blood in your stool or on the toilet paper. If you underwent a bowel prep for your procedure, you may not have a normal bowel movement for a few days.  Please Note:  You might notice some irritation and congestion in your nose or some drainage.  This is from the oxygen used during your procedure.  There is no need for concern and it should clear up in a day or so.  SYMPTOMS TO REPORT IMMEDIATELY:      Following upper endoscopy (EGD)  Vomiting of blood or coffee ground material  New chest pain or pain under the shoulder blades  Painful or persistently difficult swallowing  New shortness of breath  Fever of 100F or higher  Black, tarry-looking stools  For urgent or emergent issues, a gastroenterologist can be reached at any hour by calling [phone].   DIET:  SEE POST DILATION DIET.  Drink plenty of fluids but you should avoid alcoholic beverages  for 24 hours.  ACTIVITY:  You should plan to take it easy for the rest of today and you should NOT DRIVE or use heavy machinery until tomorrow (because of the sedation medicines used during the test).    FOLLOW UP: Our staff will call the number listed on your records 48-72 hours following your procedure to check on you and address any questions or concerns that you may have regarding the information given to you following your procedure. If we do not reach you, we will leave a message.  We will attempt to reach you two times.  During this call, we will ask if you have developed any symptoms of COVID 19. If you develop any symptoms (ie: fever, flu-like symptoms, shortness of breath, cough etc.) before then, please call [phone].  If you test positive for Covid 19 in the 2 weeks post procedure, please call and report this information to [place].    If any biopsies were taken you will be contacted by phone or by letter within the next 1-3 weeks.  Please call us at [phone] if you have not heard about the biopsies in 3 weeks.    SIGNATURES/CONFIDENTIALITY: You and/or your care partner have signed paperwork which will be entered into your electronic medical record.  These signatures attest to the fact that that the information above on your After Visit Summary has been reviewed and is understood.  Full responsibility of the  confidentiality of this discharge information lies with you and/or your care-partner. 

## 2019-01-06 NOTE — Progress Notes (Signed)
To PACU, VSS. Report to [email or web address].tb 

## 2019-01-06 NOTE — Progress Notes (Signed)
VS-CW [id number]

## 2019-01-10 ENCOUNTER — Encounter: Payer: Self-pay | Admitting: *Deleted

## 2019-01-10 ENCOUNTER — Telehealth: Payer: Self-pay | Admitting: *Deleted

## 2019-01-10 ENCOUNTER — Telehealth: Payer: Self-pay

## 2019-01-10 ENCOUNTER — Other Ambulatory Visit: Payer: Self-pay

## 2019-01-10 DIAGNOSIS — R1310 Dysphagia, unspecified: Secondary | ICD-10-CM

## 2019-01-10 DIAGNOSIS — K219 Gastro-esophageal reflux disease without esophagitis: Secondary | ICD-10-CM

## 2019-01-10 NOTE — Telephone Encounter (Signed)
Called patient and she says the discomfort is not in her esophagus, it is a burning in her stomach/abdomin after eating. She is also having some rebound pain on her left side. She is trying to eat bland foods and is taking the Carafate before breakfast and before dinner and the Protonix before breakfast.

## 2019-01-10 NOTE — Telephone Encounter (Signed)
Scheduled Barium Swallow on 01/24/19 to arrive at 9:15am at [facility]. NPO 3 hours before

## 2019-01-10 NOTE — Telephone Encounter (Signed)
-----   Message from Yetta Flock, MD sent at 01/06/2019 12:00 PM EST ----- Regarding: barium swallow Sherlynn Stalls can you contact this patient sometime next week and help schedule a barium swallow. Dx GERD, dysphagia. Thanks

## 2019-01-10 NOTE — Telephone Encounter (Signed)
Okay thanks for clarifying. Please reassure her that her stomach was recently evaluated with EGD and appeared normal. I had asked her to take the protonix twice daily and see if that will help - she should take it 30-60 minutes prior to a meal, and continue carafate. I'd like to see how she does after a few weeks on higher dose protonix, and will await her barium swallow to assess esophageal motility. If she feels worse on this regimen in the interim she should call back.

## 2019-01-10 NOTE — Telephone Encounter (Signed)
Opened in error

## 2019-01-10 NOTE — Telephone Encounter (Signed)
[name] [name] would you mind calling this patient and seeing how she is doing? Hopefully the dilation has helped her swallowing. I believe she is scheduled for a barium swallow soon. Got a message from endo she was having some discomfort following EGD, wanted to make sure this is not new. Thanks

## 2019-01-10 NOTE — Telephone Encounter (Signed)
Got it, I will call her today

## 2019-01-11 NOTE — Telephone Encounter (Signed)
Called patient and let her know Dr. Havery Moros had not seen anything abnormal in her stomach on her EGD. She will start taking the Protonix BID 30-60 mins. before meals instead on just once daily. Let her know we will await the results of her scheduled Barium swallow study and to call us if symptoms worsen in the mean time

## 2019-01-16 ENCOUNTER — Other Ambulatory Visit: Payer: Self-pay | Admitting: Obstetrics and Gynecology

## 2019-01-16 DIAGNOSIS — Z1231 Encounter for screening mammogram for malignant neoplasm of breast: Secondary | ICD-10-CM

## 2019-01-24 ENCOUNTER — Ambulatory Visit (HOSPITAL_COMMUNITY)
Admission: RE | Admit: 2019-01-24 | Discharge: 2019-01-24 | Disposition: A | Discharge status: Home / Self Care | Payer: Medicare Other | Source: Ambulatory Visit | Attending: Gastroenterology | Admitting: Gastroenterology

## 2019-01-24 ENCOUNTER — Other Ambulatory Visit: Payer: Self-pay

## 2019-01-24 DIAGNOSIS — R1310 Dysphagia, unspecified: Secondary | ICD-10-CM | POA: Insufficient documentation

## 2019-01-24 DIAGNOSIS — K219 Gastro-esophageal reflux disease without esophagitis: Secondary | ICD-10-CM | POA: Diagnosis not present

## 2019-01-27 ENCOUNTER — Telehealth: Payer: Self-pay

## 2019-01-27 ENCOUNTER — Ambulatory Visit (INDEPENDENT_AMBULATORY_CARE_PROVIDER_SITE_OTHER): Payer: Medicare Other | Admitting: Gastroenterology

## 2019-01-27 ENCOUNTER — Other Ambulatory Visit: Payer: Self-pay

## 2019-01-27 DIAGNOSIS — Z8719 Personal history of other diseases of the digestive system: Secondary | ICD-10-CM | POA: Diagnosis not present

## 2019-01-27 DIAGNOSIS — R1310 Dysphagia, unspecified: Secondary | ICD-10-CM

## 2019-01-27 DIAGNOSIS — R109 Unspecified abdominal pain: Secondary | ICD-10-CM | POA: Diagnosis not present

## 2019-01-27 DIAGNOSIS — K219 Gastro-esophageal reflux disease without esophagitis: Secondary | ICD-10-CM | POA: Diagnosis not present

## 2019-01-27 DIAGNOSIS — R1032 Left lower quadrant pain: Secondary | ICD-10-CM

## 2019-01-27 MED ORDER — AMOXICILLIN-POT CLAVULANATE 875-125 MG PO TABS: 1.0000 | ORAL_TABLET | Freq: Two times a day (BID) | ORAL | 0 refills | Status: DC

## 2019-01-27 NOTE — Progress Notes (Signed)
THIS ENCOUNTER IS A VIRTUAL VISIT DUE TO COVID-19 - PATIENT WAS NOT SEEN IN THE OFFICE. PATIENT HAS CONSENTED TO VIRTUAL VISIT / TELEMEDICINE VISIT   Location of patient: home Location of provider: office Persons participating: myself, patient Time spent on call: 20 minutes   HPI :  74 year old female here for follow-up visit.  I know her for history of diverticulitis, constipation, history of reflux and dysphagia.  See prior clinic visit for full details of her long-term history.  She has had chronic reflux on PPIs for years.  She also has intermittent dysphagia.  She has had EGDs in the past with me with empiric dilation of the esophagus and noted to have subtle stricturing at the proximal esophagus/UES.  She had recurrent dysphagia since her last visit and she underwent an EGD with me on December 18.  Findings as below.  Empiric dilation to 18 mm was performed with some heme in the proximal esophagus, she has had resolution of her dysphagia for the most part since that time and is doing well.  I had increased her Protonix to 40 mg twice daily as she has had breakthrough on lower dosing of another regimen recently.  She states the regimen is working better for her and reflux is better controlled.  She had questions about a TIF procedure which we had discussed.  She had a 1 cm hiatal hernia on EGD along with Hill grade 1 views of the cardia.  On remote barium study in 2005 she had significant reflux, and has a peptic stricture historically documented on remote EGDs as well.  She had a barium swallow on January 5 to assess for interval changes given her consideration for TIF, she had nonspecific mild dysmotility but otherwise no significant pathology.  I had discussed her case with Dr. Bryan Lemma about possible TIF, he thinks she remains a candidate.  We discussed the possibility of TIF, she wishes to do this but wants to wait for the Covid pandemic to improve first.  She has had a history of  diverticulitis in the past.  She had surgery for left-sided diverticulitis in the 1990s, she has had some recurrence of symptoms in years past.  She had evidence of diverticulitis on a CT scan in 2017, she had another CT scan in 2019 in the presence of symptoms which did not show diverticulitis.  She has developed left sided abdominal pain in recent weeks.  Pain starts about 30 to 45 minutes after eating, but also occurs at other times.  She states this feels similar to her prior diverticulitis symptoms in the past.  She has been taking Levsin as needed which has helped somewhat but symptoms generally come back.  She is not have any nausea or vomiting, she is eating okay.  No fevers.  She has not tolerated Flagyl in the past, has used Cipro monotherapy Augmentin in the past for her symptoms.  She is concerned about recurrent diverticulitis right now.  No blood in the stools.  She is taking MiraLAX.  EGD 07/19/2017 -esophagus normal. Dilation was performed in the entire esophagus with a Savary dilator with mild resistance at 16 mm and 17 mm. Relook endoscopy showed an appropriate musocal wrent in the proximal esophagus / UES. A single 7 mm sessile polyp was found in the gastric body- removed via hot snare, benign.Other smaller (<71mm) benign appearing gastric polyps noted but not removed. The exam of the stomach was otherwise normal. Biopsies negative for HP,duodenum was normal.  Colonoscopy  03/01/2015 -There were two sessile polyps in the sigmoid colon roughly 29mm in size each, and both were removed with cold forceps. There was mild pancolonic diverticulosis noted. The colonic mucosa was otherwise normal without inflammatory changes. Biopsies were taken from the right and left colon to rule out microscopic colitis. The terminal ileum was normal. The surgical anastosmosis in the sigmoid colon was widely patent and appeared normal. Retroflexed views revealed internal hemorrhoids.   EGD 01/06/19 - - A 1  cm hiatal hernia was present. - The examined esophagus was tortuous. - There was a subtle stenosis at the UES. The exam of the esophagus was otherwise normal. No esophagitis or other strictures appreciated. - A guidewire was placed and the scope was withdrawn. Dilation was performed in the entire esophagus with a Savary dilator with mild resistance at 16 mm and moderate resistance at 17 mm. Relook endoscopy showed small heme at the UES. - A few small sessile polyps were found in the gastric fundus and in the gastric body. Not sampled on this exam, benign polyps previously sampled on last exam. - The exam of the stomach was otherwise normal. Hill grade I views of the cardia noted. - The duodenal bulb and second portion of the duodenum were normal.  Barium swallow 01/24/19 -  IMPRESSION: 1. Nonspecific esophageal motility disorder. 2. Tertiary contractions noted in the middle and distal thirds of the esophagus on nearly all swallows. No evidence for overt presbyesophagus. 3. No mass lesion or stricture evident. 13 mm barium tablet passes readily into the stomach.    Past Medical History:  Diagnosis Date  . Abdominal pain    EGD and CTs neg, rx Amitriptiline (non ulcer dyspepsia) EGD 12/11.. s/p ballon dilitation  . Anxiety   . Cataract   . Cervical spondylosis without myelopathy 11/30/2012  . Chronic conjunctivitis   . Diabetes mellitus    no meds, diet controlled  . Diverticulosis of colon   . GERD with stricture    s/p dilatations  . History of Lyme disease   . HOH (hard of hearing)    bilateral hearing aids  . Hyperlipidemia   . Hypothyroidism     Dx 10-9 TSH 5.2 , then TSH normalized, eventually went up again 2012  . Melanoma ([place])    h/o  . Memory difficulties 06/05/2014  . Osteopenia   . Solar lentigo    face; s/p excision 3/09. See Derm q 6 months  . Trigeminal neuralgia    h/o      Past Surgical History:  Procedure Laterality Date  . ABDOMINAL HYSTERECTOMY     . ANKLE SURGERY  07/01/2015   R andkle tendon surgery  . BREAST BIOPSY Left 1988  . BREAST EXCISIONAL BIOPSY Left 1988  . CERVICAL FUSION  06/2017  . CHOLECYSTECTOMY    . COLON RESECTION  1999   d/t diverticulitis  . COLON SURGERY    . COLONOSCOPY    . melanoma resection Right     Right Arm Surgery, R facial surgery (05-2012)   . PTX s/t chest tube    . SHOULDER SURGERY     right  . UPPER GASTROINTESTINAL ENDOSCOPY    . WRIST SURGERY     removed melaoma, right   Family History  Problem Relation Age of Onset  . Prostate cancer Father   . Endometrial cancer Sister        clear cell carcinoma  . Stroke Sister   . Heart attack Paternal Grandfather  GF in his 52s  . Melanoma Maternal Uncle   . Colon cancer Neg Hx   . Breast cancer Neg Hx   . Diabetes Neg Hx   . Stomach cancer Neg Hx   . Rectal cancer Neg Hx   . Esophageal cancer Neg Hx    Social History   Tobacco Use  . Smoking status: Former Smoker    Start date: 01/20/1976    Quit date: 1981    Years since quitting: 40.0  . Smokeless tobacco: Never Used  Substance Use Topics  . Alcohol use: Yes    Alcohol/week: 2.0 - 3.0 standard drinks    Types: 2 - 3 Glasses of wine per week  . Drug use: No   Current Outpatient Medications  Medication Sig Dispense Refill  . ALPRAZolam (XANAX) 0.5 MG tablet TAKE 1 TABLET(0.5 MG) BY MOUTH AT BEDTIME AS NEEDED FOR SLEEP 30 tablet 3  . amitriptyline (ELAVIL) 25 MG tablet Take 1 tablet (25 mg total) by mouth at bedtime. 90 tablet 3  . baclofen (LIORESAL) 10 MG tablet Take 1 tablet (10 mg total) by mouth 2 (two) times daily.  0  . Cholecalciferol (VITAMIN D3) 2000 UNITS TABS Take 1 tablet by mouth daily.     . diclofenac sodium (VOLTAREN) 1 % GEL Apply topically 4 (four) times daily.    [name] gabapentin (NEURONTIN) 300 MG capsule Take 1 in the am 1 around 2pm and 2 at hs 120 capsule 6  . hyoscyamine (LEVSIN SL) 0.125 MG SL tablet DISSOLVE 1 TABLET(0.125 MG) UNDER THE TONGUE EVERY  6 HOURS AS NEEDED FOR CRAMPING 90 tablet 1  . LEVEMIR FLEXTOUCH 100 UNIT/ML Pen Inject 6 Units into the skin at bedtime.     [name] levothyroxine (SYNTHROID, LEVOTHROID) 50 MCG tablet TK 1 T PO QD  12  . Lifitegrast (XIIDRA) 5 % SOLN Apply to eye.    . Multiple Vitamin (MULTIVITAMIN) tablet Take 1 tablet by mouth daily.      Glory Rosebush VERIO test strip 2 (two) times daily. for testing  5  . pantoprazole (PROTONIX) 40 MG tablet Take 1 tablet (40 mg total) by mouth 2 (two) times daily. 180 tablet 1  . Probiotic Product (PROBIOTIC PO) Take 1 tablet by mouth daily.    . rosuvastatin (CRESTOR) 10 MG tablet Take 1 tablet (10 mg total) by mouth daily. 90 tablet 1  . sucralfate (CARAFATE) 1 GM/10ML suspension Take 10 mLs (1 g total) by mouth every 6 (six) hours as needed. 420 mL 1   No current facility-administered medications for this visit.   Allergies  Allergen Reactions  . Rocephin [Ceftriaxone] Anaphylaxis  . Prednisone Other (See Comments)    High blood sugars (pt reports)  . Other     VERY DELICATE SKIN FROM MELANOMA   . Benadryl [Diphenhydramine Hcl] Anxiety    Hyperactivity     Review of Systems: All systems reviewed and negative except where noted in HPI.    DG ESOPHAGUS W DOUBLE CM (HD)  Result Date: 01/24/2019 CLINICAL DATA:  Dysphagia. EXAM: ESOPHOGRAM / BARIUM SWALLOW / BARIUM TABLET STUDY TECHNIQUE: Combined double contrast and single contrast examination performed using effervescent crystals, thick barium liquid, and thin barium liquid. The patient was observed with fluoroscopy swallowing a 13 mm barium sulphate tablet. FLUOROSCOPY TIME:  Fluoroscopy Time:  1 minutes and 24 seconds. Radiation Exposure Index (if provided by the fluoroscopic device): 28.3 mGy Number of Acquired Spot Images: 0 COMPARISON:  None. FINDINGS: Frontal and  lateral views of the hypopharynx while swallowing thick barium are unremarkable. Patient is status post anterior fusion from C4-C7. Double contrast imaging  of the esophagus shows no evidence for mass lesion, diverticulum, or gross mucosal ulceration. No hiatal hernia. Evaluation of esophageal motility reveals disruption of primary peristalsis in the middle third of the esophagus on nearly all swallows. Tertiary contractions are noted in the middle and distal thirds of the esophagus without overt presbyesophagus. 13 mm barium tablet passes readily into the stomach when taken with water. IMPRESSION: 1. Nonspecific esophageal motility disorder. 2. Tertiary contractions noted in the middle and distal thirds of the esophagus on nearly all swallows. No evidence for overt presbyesophagus. 3. No mass lesion or stricture evident. 13 mm barium tablet passes readily into the stomach. Electronically Signed   By: Misty Stanley M.D.   On: 01/24/2019 10:15    Physical Exam: NA - phone visit   ASSESSMENT AND PLAN: 74 year old female here for reassessment of the following:  Abdominal pain / history of diverticulitis - some persistent left-sided abdominal pain which is bothering her that is very reminiscent of her prior symptoms of diverticulitis in the past.  She has reliably responded to antibiotics in the past for the symptoms.  She has been using Levsin which provide some mild relief but has not resolved it.  I discussed options with her to include empiric antibiotics versus cross-sectional imaging.  I think it is reasonable to give her an empiric course of antibiotics for 7 to 10 days to see if we can help with her symptoms.  If it does not help and symptoms persist we may consider imaging at that point.  She was agreeable to this.  In discussion of options, she wants to try Augmentin. Of note, she has a ceftriaxone allergy in her chart. She states she has had and tolerated Augmentin in the past and wishes to proceed with it. She can also continue Levsin PRN.  If no improvement I asked her to call back for reassessment  GERD / dysphagia - longstanding reflux that have  required longstanding PPI use.  She is interested in TIF, and given her recent EGD and prior objective findings of reflux on a remote barium study and history of Schatzki ring at the [facility] remotely, she is a candidate for TIF in order to come off chronic PPIs.  I have discussed her case with Dr. Bryan Lemma who is agreeable to seeing her in clinic for consult.  She does want to proceed with this consultation.  Otherwise her dysphagia resolved with the last dilation.  She can follow-up with me as needed if this recurs.  I spent 30 minutes of time, including in depth chart review, independent review of results as outlined above, communicating results with the patient directly, coordinating care, ordering studies and medications as appropriate, and documenting this encounter.  Gonzales Cellar, MD [facility]

## 2019-01-27 NOTE — Telephone Encounter (Signed)
Patient called and asked if her appt today could be virtual.  Called back and spoke to patient. She would like to do a virtual call due to the weather.  Her cell phone is [phone] and she agreeable to do a doximity on her iPhone.  Pt's med list and allergies were reviewed and updated.

## 2019-01-27 NOTE — Telephone Encounter (Signed)
Called and spoke with patient-patient has been scheduled to see Dr. Bryan Lemma at the [facility] office on 03/06/2019 at 1:40 pm; Patient advised to call back to the office at [phone] should questions/concerns arise;  Patient verbalized understanding of information/instructions;

## 2019-01-27 NOTE — Telephone Encounter (Signed)
-----   Message from Yetta Flock, MD sent at 01/27/2019  1:11 PM EST ----- Regarding: cirigliano consult I spoke with this patient today - she is willing to see Dr. Bryan Lemma for an office visit to discuss TIF if you can help coordinate. Thanks

## 2019-01-27 NOTE — Telephone Encounter (Signed)
Okay that's fine, thanks

## 2019-01-30 LAB — HM DIABETES EYE EXAM

## 2019-02-01 ENCOUNTER — Encounter: Payer: Self-pay | Admitting: General Practice

## 2019-02-01 ENCOUNTER — Telehealth: Payer: Self-pay

## 2019-02-01 ENCOUNTER — Other Ambulatory Visit: Payer: Self-pay | Admitting: General Practice

## 2019-02-01 NOTE — Telephone Encounter (Signed)
Pt is overdue for OV.  Needs to schedule

## 2019-02-01 NOTE — Telephone Encounter (Signed)
Called patient and gave Dr. Doyne Keel recommendation to try to finish the Augmentin and take the carafate in the meantime to help with her stomach symptoms

## 2019-02-01 NOTE — Telephone Encounter (Signed)
Last OV 05/06/18 Alprazolam last filled 10/05/18 #30 with 3

## 2019-02-01 NOTE — Telephone Encounter (Signed)
I called patient and she states she is having  pain between her shoulders, a lot of belching, and upper abdominal pain when she eats. She describes it as a heaviness/pain that travels up into her chest. When I asked her if these are symptoms she has had before, she states yes. She thought the Augmentin might be causing this.

## 2019-02-01 NOTE — Telephone Encounter (Signed)
If the Augmentin has caused this in the past, it's possible it could be related, can cause upset stomach. If possible it would be best if she can complete the Augmentin course for her diverticulitis, I think she only has a few days left of it. Hopefully when she stops it, symptoms get better. She can try some of the carafate to see if that helps, if she has not been taking it recently. Thanks

## 2019-02-02 MED ORDER — ALPRAZOLAM 0.5 MG PO TABS: ORAL_TABLET | ORAL | 0 refills | Status: DC

## 2019-02-03 ENCOUNTER — Encounter: Payer: Self-pay | Admitting: Family Medicine

## 2019-02-03 ENCOUNTER — Other Ambulatory Visit: Payer: Self-pay

## 2019-02-03 ENCOUNTER — Ambulatory Visit (INDEPENDENT_AMBULATORY_CARE_PROVIDER_SITE_OTHER): Payer: Medicare Other | Admitting: Family Medicine

## 2019-02-03 VITALS — Temp 96.3°F

## 2019-02-03 DIAGNOSIS — G8929 Other chronic pain: Secondary | ICD-10-CM | POA: Diagnosis not present

## 2019-02-03 DIAGNOSIS — F411 Generalized anxiety disorder: Secondary | ICD-10-CM | POA: Diagnosis not present

## 2019-02-03 DIAGNOSIS — E785 Hyperlipidemia, unspecified: Secondary | ICD-10-CM | POA: Diagnosis not present

## 2019-02-03 DIAGNOSIS — R1013 Epigastric pain: Secondary | ICD-10-CM | POA: Diagnosis not present

## 2019-02-03 NOTE — Progress Notes (Signed)
I have discussed the procedure for the virtual visit with the patient who has given consent to proceed with assessment and treatment.   [name] [name], CMA      

## 2019-02-03 NOTE — Progress Notes (Signed)
Virtual Visit via Video   I connected with patient on 02/03/19 at  2:00 PM EST by a video enabled telemedicine application and verified that I am speaking with the correct person using two identifiers.  Location patient: Home Location provider: [facility], Office Persons participating in the virtual visit: Patient, Provider, [place] (Jess B)  I discussed the limitations of evaluation and management by telemedicine and the availability of in person appointments. The patient expressed understanding and agreed to proceed.  Subjective:   HPI:  Abd pain- pt had 2 back surgeries after a fall this summer.  Was in inpt rehab until December.  She was wearing back brace the entire time.  After leaving rehab, she developed abd pain.  Had GI appt.  Had EGD w/ dilation, barium swallow, and GERD medications adjusted.  Pt concerned that back brace caused 'tortuous esophagus' seen by GI.  Pain seems to occur at night, not painful to press on upper abdomen.  Hyperlipidemia- chronic problem, on Crestor 10mg  daily.  + chronic abd pain, denies N/V.  Denies CP, SOB.  Anxiety- chronic problem, on Alprazolam as needed for sleep.  This was refilled yesterday.  ROS:   See pertinent positives and negatives per HPI.  Patient Active Problem List   Diagnosis Date Noted  . Ingrown toenail without infection 04/11/2015  . Memory difficulties 06/05/2014  . Abdominal pain, chronic, epigastric 01/29/2014  . Dysphagia 01/29/2014  . History of esophageal stricture 01/29/2014  . Chronic conjunctivitis 02/07/2013  . Cervical spondylosis without myelopathy 11/30/2012  . Annual physical exam 03/18/2011  . Headache 02/02/2011  . NEURALGIA, TRIGEMINAL-- on Neurontin, Elavil, propranolol 03/13/2009  . Hypothyroidism 11/07/2007  . Type 2 diabetes mellitus with diabetic polyneuropathy ([place]) 02/14/2007  . Anxiety-insomnia-UDS 02/14/2007  . GERD 10/23/2006  . Hx of melanoma of skin 05/10/2006  . Hyperlipidemia  05/10/2006  . Diverticulitis of colon 05/10/2006  . Osteopenia 05/10/2006  . ESOPHAGEAL STRICTURE 02/16/2005    Social History   Tobacco Use  . Smoking status: Former Smoker    Start date: 01/20/1976    Quit date: 1981    Years since quitting: 40.0  . Smokeless tobacco: Never Used  Substance Use Topics  . Alcohol use: Yes    Alcohol/week: 2.0 - 3.0 standard drinks    Types: 2 - 3 Glasses of wine per week    Current Outpatient Medications:  .  ALPRAZolam (XANAX) 0.5 MG tablet, TAKE 1 TABLET(0.5 MG) BY MOUTH AT BEDTIME AS NEEDED FOR SLEEP, Disp: 30 tablet, Rfl: 0 .  amitriptyline (ELAVIL) 25 MG tablet, Take 1 tablet (25 mg total) by mouth at bedtime., Disp: 90 tablet, Rfl: 3 .  amoxicillin-clavulanate (AUGMENTIN) 875-125 MG tablet, Take 1 tablet by mouth 2 (two) times daily., Disp: 20 tablet, Rfl: 0 .  baclofen (LIORESAL) 10 MG tablet, Take 1 tablet (10 mg total) by mouth 2 (two) times daily., Disp: , Rfl: 0 .  BD PEN NEEDLE NANO 2ND GEN 32G X 4 MM MISC, daily. as directed, Disp: , Rfl:  .  Cholecalciferol (VITAMIN D3) 2000 UNITS TABS, Take 1 tablet by mouth daily. , Disp: , Rfl:  .  diclofenac Sodium (VOLTAREN) 1 % GEL, Apply topically., Disp: , Rfl:  .  gabapentin (NEURONTIN) 300 MG capsule, Take 1 in the am 1 around 2pm and 2 at hs, Disp: 120 capsule, Rfl: 6 .  hyoscyamine (LEVSIN SL) 0.125 MG SL tablet, DISSOLVE 1 TABLET(0.125 MG) UNDER THE TONGUE EVERY 6 HOURS AS NEEDED FOR CRAMPING,  Disp: 90 tablet, Rfl: 1 .  Lancets (ONETOUCH DELICA PLUS Q000111Q) MISC, 2 (two) times daily. as directed, Disp: , Rfl:  .  LEVEMIR FLEXTOUCH 100 UNIT/ML Pen, Inject 6 Units into the skin at bedtime. , Disp: , Rfl:  .  levothyroxine (SYNTHROID, LEVOTHROID) 50 MCG tablet, TK 1 T PO QD, Disp: , Rfl: 12 .  Lifitegrast (XIIDRA) 5 % SOLN, Apply to eye., Disp: , Rfl:  .  Multiple Vitamin (MULTIVITAMIN) tablet, Take 1 tablet by mouth daily.  , Disp: , Rfl:  .  ONETOUCH VERIO test strip, 2 (two) times daily.  for testing, Disp: , Rfl: 5 .  pantoprazole (PROTONIX) 40 MG tablet, Take 1 tablet (40 mg total) by mouth 2 (two) times daily., Disp: 180 tablet, Rfl: 1 .  Probiotic Product (PROBIOTIC PO), Take 1 tablet by mouth daily., Disp: , Rfl:  .  rosuvastatin (CRESTOR) 10 MG tablet, Take 1 tablet (10 mg total) by mouth daily., Disp: 90 tablet, Rfl: 1 .  sucralfate (CARAFATE) 1 GM/10ML suspension, Take 10 mLs (1 g total) by mouth every 6 (six) hours as needed., Disp: 420 mL, Rfl: 1  Allergies  Allergen Reactions  . Rocephin [Ceftriaxone] Anaphylaxis  . Prednisone Other (See Comments)    High blood sugars (pt reports)  . Other     VERY DELICATE SKIN FROM MELANOMA   . Benadryl [Diphenhydramine Hcl] Anxiety    Hyperactivity    Objective:   Temp (!) 96.3 F (35.7 C) (Oral)  AAOx3, NAD NCAT, EOMI No obvious CN deficits Coloring WNL Pt is able to speak clearly, coherently without shortness of breath or increased work of breathing.  Thought process is linear.  Mood is appropriate.   Assessment and Plan:   Chronic abd pain- ongoing issue for pt.  Reassured her that her tortuous esophagus and other issues are not stemming from her back brace.  Encouraged her to f/u w/ GI as previously discussed.  Pt expressed understanding and is in agreement w/ plan.   Hyperlipidemia- chronic problem.  Due for labs.  Adjust meds prn.  Anxiety- pt's alprazolam was filled yesterday.  She remains anxious regarding her multiple medical issues and this past year has been particularly difficult.  Will continue to follow along.   Annye Asa, MD 02/03/2019

## 2019-02-10 ENCOUNTER — Other Ambulatory Visit: Payer: Self-pay

## 2019-02-10 ENCOUNTER — Emergency Department (HOSPITAL_BASED_OUTPATIENT_CLINIC_OR_DEPARTMENT_OTHER)
Admission: EM | Admit: 2019-02-10 | Discharge: 2019-02-10 | Disposition: A | Discharge status: Home / Self Care | Payer: Medicare Other | Attending: Emergency Medicine | Admitting: Emergency Medicine

## 2019-02-10 ENCOUNTER — Emergency Department (HOSPITAL_BASED_OUTPATIENT_CLINIC_OR_DEPARTMENT_OTHER): Payer: Medicare Other

## 2019-02-10 ENCOUNTER — Encounter (HOSPITAL_BASED_OUTPATIENT_CLINIC_OR_DEPARTMENT_OTHER): Payer: Self-pay

## 2019-02-10 DIAGNOSIS — E119 Type 2 diabetes mellitus without complications: Secondary | ICD-10-CM | POA: Insufficient documentation

## 2019-02-10 DIAGNOSIS — Z79899 Other long term (current) drug therapy: Secondary | ICD-10-CM | POA: Insufficient documentation

## 2019-02-10 DIAGNOSIS — Z87891 Personal history of nicotine dependence: Secondary | ICD-10-CM | POA: Insufficient documentation

## 2019-02-10 DIAGNOSIS — E039 Hypothyroidism, unspecified: Secondary | ICD-10-CM | POA: Diagnosis not present

## 2019-02-10 DIAGNOSIS — R1013 Epigastric pain: Secondary | ICD-10-CM | POA: Diagnosis not present

## 2019-02-10 DIAGNOSIS — K219 Gastro-esophageal reflux disease without esophagitis: Secondary | ICD-10-CM | POA: Insufficient documentation

## 2019-02-10 DIAGNOSIS — R1032 Left lower quadrant pain: Secondary | ICD-10-CM | POA: Diagnosis present

## 2019-02-10 LAB — COMPREHENSIVE METABOLIC PANEL
ALT: 15 U/L (ref 0–44)
AST: 21 U/L (ref 15–41)
Albumin: 4.7 g/dL (ref 3.5–5.0)
Alkaline Phosphatase: 97 U/L (ref 38–126)
Anion gap: 9 (ref 5–15)
BUN: 19 mg/dL (ref 8–23)
CO2: 28 mmol/L (ref 22–32)
Calcium: 10 mg/dL (ref 8.9–10.3)
Chloride: 104 mmol/L (ref 98–111)
Creatinine, Ser: 0.69 mg/dL (ref 0.44–1.00)
GFR calc Af Amer: >60 mL/min (ref >60)
GFR calc non Af Amer: >60 mL/min (ref >60)
Glucose, Bld: 138 mg/dL — ABNORMAL HIGH (ref 70–99)
Potassium: 4.7 mmol/L (ref 3.5–5.1)
Sodium: 141 mmol/L (ref 135–145)
Total Bilirubin: 0.4 mg/dL (ref 0.3–1.2)
Total Protein: 8 g/dL (ref 6.5–8.1)

## 2019-02-10 LAB — CBC
HCT: 41.3 % (ref 36.0–46.0)
Hemoglobin: 13.5 g/dL (ref 12.0–15.0)
MCH: 29.8 pg (ref 26.0–34.0)
MCHC: 32.7 g/dL (ref 30.0–36.0)
MCV: 91.2 fL (ref 80.0–100.0)
Platelets: 246 10*3/uL (ref 150–400)
RBC: 4.53 MIL/uL (ref 3.87–5.11)
RDW: 13.1 % (ref 11.5–15.5)
WBC: 8.5 10*3/uL (ref 4.0–10.5)
nRBC: 0 % (ref 0.0–0.2)

## 2019-02-10 LAB — CBG MONITORING, ED: Glucose-Capillary: 96 mg/dL (ref 70–99)

## 2019-02-10 LAB — TROPONIN I (HIGH SENSITIVITY): Troponin I (High Sensitivity): <2 ng/L (ref <18)

## 2019-02-10 LAB — LIPASE, BLOOD: Lipase: 29 U/L (ref 11–51)

## 2019-02-10 MED ORDER — IOHEXOL 300 MG/ML  SOLN: 100.0000 mL | Freq: Once | INTRAMUSCULAR | Status: DC | PRN

## 2019-02-10 NOTE — ED Provider Notes (Signed)
[facility] EMERGENCY DEPARTMENT Provider Note   CSN: [id number] Arrival date & time: 02/10/19  1753     History Chief Complaint  Patient presents with  . Abdominal Pain    [name] [name] is a 74 y.o. female with past medical history significant for abdominal pain, anxiety, diabetes, GERD, hyperlipidemia, osteopenia, trigeminal neuralgia presenting to emergency room today with chief complaint of abdominal pain as 1 month.  Patient states the pain is intermittent.  She states the pain started in her left lower quadrant and she called her GI doctor who prescribed her Augmentin for probable diverticulitis.  She states she took the antibiotics as prescribed and the abdominal pain resolved.  She states today she had heartburn symptoms.  It felt like her usual GERD however caught her off guard and made her anxious. She denies any associated chest pain or shortness of breath.  Also denies fever, chills, difficulty breathing, nausea, vomiting, gross hematuria, urinary frequency, diarrhea, blood in stool.  Chart review shows patient is followed by [facility]. She had upper endoscopy on 01/06/2019 and had dilation of esophagus.     Past Medical History:  Diagnosis Date  . Abdominal pain    EGD and CTs neg, rx Amitriptiline (non ulcer dyspepsia) EGD 12/11.. s/p ballon dilitation  . Anxiety   . Cataract   . Cervical spondylosis without myelopathy 11/30/2012  . Chronic conjunctivitis   . Diabetes mellitus    no meds, diet controlled  . Diverticulosis of colon   . GERD with stricture    s/p dilatations  . History of Lyme disease   . HOH (hard of hearing)    bilateral hearing aids  . Hyperlipidemia   . Hypothyroidism     Dx 10-9 TSH 5.2 , then TSH normalized, eventually went up again 2012  . Melanoma ([place])    h/o  . Memory difficulties 06/05/2014  . Osteopenia   . Solar lentigo    face; s/p excision 3/09. See Derm q 6 months  . Trigeminal neuralgia    h/o     Patient  Active Problem List   Diagnosis Date Noted  . Ingrown toenail without infection 04/11/2015  . Memory difficulties 06/05/2014  . Abdominal pain, chronic, epigastric 01/29/2014  . Dysphagia 01/29/2014  . History of esophageal stricture 01/29/2014  . Chronic conjunctivitis 02/07/2013  . Cervical spondylosis without myelopathy 11/30/2012  . Annual physical exam 03/18/2011  . Headache 02/02/2011  . NEURALGIA, TRIGEMINAL-- on Neurontin, Elavil, propranolol 03/13/2009  . Hypothyroidism 11/07/2007  . Type 2 diabetes mellitus with diabetic polyneuropathy ([place]) 02/14/2007  . Anxiety-insomnia-UDS 02/14/2007  . GERD 10/23/2006  . Hx of melanoma of skin 05/10/2006  . Hyperlipidemia 05/10/2006  . Diverticulitis of colon 05/10/2006  . Osteopenia 05/10/2006  . ESOPHAGEAL STRICTURE 02/16/2005    Past Surgical History:  Procedure Laterality Date  . ABDOMINAL HYSTERECTOMY    . ANKLE SURGERY  07/01/2015   R andkle tendon surgery  . BACK SURGERY  07/25/18, 09/20  . BREAST BIOPSY Left 1988  . BREAST EXCISIONAL BIOPSY Left 1988  . CERVICAL FUSION  06/2017  . CHOLECYSTECTOMY    . COLON RESECTION  1999   d/t diverticulitis  . COLON SURGERY    . COLONOSCOPY    . melanoma resection Right     Right Arm Surgery, R facial surgery (05-2012)   . PTX s/t chest tube    . SHOULDER SURGERY     right  . UPPER GASTROINTESTINAL ENDOSCOPY    .  WRIST SURGERY     removed melaoma, right     OB History   No obstetric history on file.     Family History  Problem Relation Age of Onset  . Prostate cancer Father   . Endometrial cancer Sister        clear cell carcinoma  . Stroke Sister   . Heart attack Paternal Grandfather        GF in his 81s  . Melanoma Maternal Uncle   . Colon cancer Neg Hx   . Breast cancer Neg Hx   . Diabetes Neg Hx   . Stomach cancer Neg Hx   . Rectal cancer Neg Hx   . Esophageal cancer Neg Hx     Social History   Tobacco Use  . Smoking status: Former Smoker    Start  date: 01/20/1976    Quit date: 1981    Years since quitting: 40.0  . Smokeless tobacco: Never Used  Substance Use Topics  . Alcohol use: Yes    Alcohol/week: 2.0 - 3.0 standard drinks    Types: 2 - 3 Glasses of wine per week  . Drug use: No    Home Medications Prior to Admission medications   Medication Sig Start Date End Date Taking? Authorizing Provider  ALPRAZolam Duanne Moron) 0.5 MG tablet TAKE 1 TABLET(0.5 MG) BY MOUTH AT BEDTIME AS NEEDED FOR SLEEP 02/02/19   Midge Minium, MD  amitriptyline (ELAVIL) 25 MG tablet Take 1 tablet (25 mg total) by mouth at bedtime. 03/08/18   Dennie Bible, NP  amoxicillin-clavulanate (AUGMENTIN) 875-125 MG tablet Take 1 tablet by mouth 2 (two) times daily. 01/27/19   Armbruster, Carlota Raspberry, MD  baclofen (LIORESAL) 10 MG tablet Take 1 tablet (10 mg total) by mouth 2 (two) times daily. 01/27/19   Armbruster, Carlota Raspberry, MD  BD PEN NEEDLE NANO 2ND GEN 32G X 4 MM MISC daily. as directed 01/23/19   [provider]  Cholecalciferol (VITAMIN D3) 2000 UNITS TABS Take 1 tablet by mouth daily.     [provider]  diclofenac Sodium (VOLTAREN) 1 % GEL Apply topically.    [provider]  gabapentin (NEURONTIN) 300 MG capsule Take 1 in the am 1 around 2pm and 2 at hs 09/20/18   Kathrynn Ducking, MD  hyoscyamine (LEVSIN SL) 0.125 MG SL tablet DISSOLVE 1 TABLET(0.125 MG) UNDER THE TONGUE EVERY 6 HOURS AS NEEDED FOR CRAMPING 04/01/18   Midge Minium, MD  Lancets (ONETOUCH DELICA PLUS Q000111Q) Luana 2 (two) times daily. as directed 12/28/18   [provider]  LEVEMIR FLEXTOUCH 100 UNIT/ML Pen Inject 6 Units into the skin at bedtime.  02/23/18   [provider]  levothyroxine (SYNTHROID, LEVOTHROID) 50 MCG tablet TK 1 T PO QD 03/02/16   [provider]  Lifitegrast Shirley Friar) 5 % SOLN Apply to eye.    [provider]  Multiple Vitamin (MULTIVITAMIN) tablet Take 1 tablet by mouth daily.      [provider]  [facility] VERIO test strip 2 (two) times daily. for testing 10/12/17   [provider]  pantoprazole (PROTONIX) 40 MG tablet Take 1 tablet (40 mg total) by mouth 2 (two) times daily. 01/27/19   Armbruster, Carlota Raspberry, MD  Probiotic Product (PROBIOTIC PO) Take 1 tablet by mouth daily.    [provider]  rosuvastatin (CRESTOR) 10 MG tablet Take 1 tablet (10 mg total) by mouth daily. 11/15/18   Midge Minium, MD  sucralfate (CARAFATE) 1 GM/10ML suspension Take 10 mLs (1 g total) by mouth every 6 (six) hours as needed. 01/04/19   Armbruster, Carlota Raspberry, MD    Allergies    Rocephin [ceftriaxone], Prednisone, Other, and Benadryl [diphenhydramine hcl]  Review of Systems   Review of Systems  All other systems are reviewed and are negative for acute change except as noted in the HPI.   Physical Exam Updated Vital Signs BP (!) 154/86 (BP Location: Left Arm)   Pulse 94   Temp 98.1 F (36.7 C) (Oral)   Resp 16   Ht 5\' 4"  (1.626 m)   Wt 59.4 kg   SpO2 99%   BMI 22.49 kg/m   Physical Exam Vitals and nursing note reviewed.  Constitutional:      General: [name].    Appearance: She is not ill-appearing.  HENT:     Head: Normocephalic and atraumatic.     Right Ear: Tympanic membrane and external ear normal.     Left Ear: Tympanic membrane and external ear normal.     Nose: Nose normal.     Mouth/Throat:     Mouth: Mucous membranes are moist.     Pharynx: Oropharynx is clear.  Eyes:     General: No scleral icterus.       Right eye: No discharge.        Left eye: No discharge.     Extraocular Movements: Extraocular movements intact.     Conjunctiva/sclera: Conjunctivae normal.     Pupils: Pupils are equal, round, and reactive to light.  Neck:     Vascular: No JVD.  Cardiovascular:     Rate and Rhythm: Normal rate and regular rhythm.     Pulses: Normal pulses.          Radial pulses are 2+ on the right side and 2+ on the left side.      Heart sounds: Normal heart sounds.  Pulmonary:     Comments: Lungs clear to auscultation in all fields. Symmetric chest rise. No wheezing, rales, or rhonchi. Abdominal:     Comments: Abdominal surgical scar. Abdomen is soft, non-distended, and non-tender in all quadrants. No rigidity, no guarding. No peritoneal signs. Normoactive bowel sounds  Musculoskeletal:        General: Normal range of motion.     Cervical back: Normal range of motion.  Skin:    General: Skin is warm and dry.     Capillary Refill: Capillary refill takes less than 2 seconds.  Neurological:     Mental Status: She is oriented to person, place, and time.     GCS: GCS eye subscore is 4. GCS verbal subscore is 5. GCS motor subscore is 6.     Comments: Fluent speech, no facial droop.  Psychiatric:        Mood and Affect: Mood is anxious.        Behavior: Behavior normal.       ED Results / Procedures / Treatments   Labs (all labs ordered are listed, but only abnormal results are displayed) Labs Reviewed  COMPREHENSIVE METABOLIC PANEL - Abnormal; Notable for the following components:      Result Value   Glucose, Bld 138 (*)    All other components within normal limits  LIPASE, BLOOD  CBC  CBG MONITORING, ED  TROPONIN I (HIGH SENSITIVITY)    EKG EKG Interpretation  Date/Time:  Friday February 10 2019 18:02:59 EST Ventricular Rate:  85 PR  Interval:  132 QRS Duration: 90 QT Interval:  352 QTC Calculation: 418 R Axis:   58 Text Interpretation: Normal sinus rhythm No STEMI Confirmed by Octaviano Glow [phone]) on 02/10/2019 6:17:44 PM   Radiology No results found.  Procedures Procedures (including critical care time)  Medications Ordered in ED Medications - No data to display  ED Course  I have reviewed the triage vital signs and the nursing notes.  Pertinent labs & imaging results that were available during my care of the patient were reviewed by me and considered in my medical decision making (see  chart for details).    MDM Rules/Calculators/A&P                      Patient seen and examined. Patient presents awake, alert, hemodynamically stable, afebrile, non toxic. On exam she appears anxious.  Lungs are clear to auscultation all fields.  Abdomen is nontender, no peritoneal signs.  Specifically no left lower quadrant tenderness. normoactive bowel sounds.  No CVA tenderness.  Recently had Augmentin for suspected diverticulitis, she states she took them as prescribed. Her initial symptom was left lower quadrant pain which has resolved.  I viewed labs ordered in triage.  She has no leukocytosis, no anemia.  No severe electrolyte derangement, no renal insufficiency, normal liver enzymes, normal anion gap.  Lipase is within normal range.  Troponin is negative.  EKG without ischemic changes, normal sinus rhythm. She is tolerating PO intake while here in the ED. Patient's presentation is consistent with GERD.  She has reassuring vital signs, exam and labs today.  Doubt acute surgical abdomen.  Doubt cardiac etiology of symptoms.  Discussed at length lab results with patient, she agrees with plan to discharge home with symptomatic care.  The patient was discussed with and seen by Dr. Langston Masker who agrees with the treatment plan to discharge home with GI follow up.  The patient appears reasonably screened and/or stabilized for discharge and I doubt any other medical condition or other [facility] requiring further screening, evaluation, or treatment in the ED at this time prior to discharge. The patient is safe for discharge with strict return precautions discussed.     Portions of this note were generated with [employer]. Dictation errors may occur despite best attempts at proofreading.    Final Clinical Impression(s) / ED Diagnoses Final diagnoses:  Gastroesophageal reflux disease, unspecified whether esophagitis present    Rx / DC Orders ED Discharge Orders    None        Flint Melter 02/11/19 0047    Wyvonnia Dusky, MD 02/11/19 1251

## 2019-02-10 NOTE — ED Triage Notes (Signed)
Pt reports a burning abd pain x 1 month that has now started radiating up into her chest. PT has associated nausea. Denies vomiting, and ShOB. Pt had an endoscopy and barium swallow for same problems in December.

## 2019-02-10 NOTE — Discharge Instructions (Addendum)
You have been seen today for abdominal pain and acid reflux. Please read and follow all provided instructions. Return to the emergency room for worsening condition or new concerning symptoms.    1. Medications:  Continue usual home medications Take medications as prescribed. Please review all of the medicines and only take them if you do not have an allergy to them.   2. Treatment: rest, drink plenty of fluids. If you are not hungry it is important to stay well hydrated and try to at least drink protein shakes or broth  3. Follow Up:  Please follow up with primary care provider or GI doctor by scheduling an appointment as soon as possible for a visit     It is also a possibility that you have an allergic reaction to any of the medicines that you have been prescribed - Everybody reacts differently to medications and while MOST people have no trouble with most medicines, you may have a reaction such as nausea, vomiting, rash, swelling, shortness of breath. If this is the case, please stop taking the medicine immediately and contact your physician.  ?

## 2019-02-11 ENCOUNTER — Encounter: Payer: Self-pay | Admitting: Family Medicine

## 2019-02-14 ENCOUNTER — Encounter: Payer: Self-pay | Admitting: General Practice

## 2019-02-15 ENCOUNTER — Other Ambulatory Visit: Payer: Self-pay

## 2019-02-15 ENCOUNTER — Ambulatory Visit (INDEPENDENT_AMBULATORY_CARE_PROVIDER_SITE_OTHER): Payer: Medicare Other | Admitting: Nurse Practitioner

## 2019-02-15 ENCOUNTER — Ambulatory Visit: Payer: Medicare Other

## 2019-02-15 ENCOUNTER — Encounter: Payer: Self-pay | Admitting: Nurse Practitioner

## 2019-02-15 ENCOUNTER — Other Ambulatory Visit: Payer: Self-pay | Admitting: Nurse Practitioner

## 2019-02-15 VITALS — BP 110/66 | HR 96 | Temp 98.6°F | Ht 63.25 in | Wt 134.4 lb

## 2019-02-15 DIAGNOSIS — R1032 Left lower quadrant pain: Secondary | ICD-10-CM

## 2019-02-15 DIAGNOSIS — R1012 Left upper quadrant pain: Secondary | ICD-10-CM

## 2019-02-15 MED ORDER — FAMOTIDINE 20 MG PO TABS: 20.0000 mg | ORAL_TABLET | Freq: Every day | ORAL | 0 refills | Status: DC

## 2019-02-15 NOTE — Progress Notes (Signed)
Agree with assessment and plan as outlined.  

## 2019-02-15 NOTE — Progress Notes (Signed)
02/15/2019 LAILEE BUTTOLPH [id number] 10-Jan-1946  Chief complaint: abdominal pain   History of Present Illness: [name] [name] is a 74 year old female with a past medical history of anxiety, DM II on insulin, cervical spondylosis, prior Lyme disease,  Hypothyroidism, osteopenia, trigeminal neuralgia, melanoma, HOH and GERD and diverticulitis.  Past cholecystectomy and partial left sided colon resection secondary to diverticular disease in the 19990's.  She underwent a virtual tele-visit with Dr. Havery Moros on 01/27/2019 due to having LLQ abdominal pain x 2 weeks. She was prescribed Augmentin 875mg  po bid x 10 days and her LLQ pain resolved. Her most recent colonoscopy was 03/01/2015, 2 polyps were removed from the sigmoid colon. Biopsies benign colonic mucosa and not true colon polyps. She was advised to repeat a colonoscopy in 10 years. Dr. Havery Moros also discussed her chronic GERD hand dysphagia symptoms with chronic PPI use. Dr. Havery Moros assessed she was an appropriate candidate for trans incisionless fundoplication (TIF) surgery which would hopefully allow her to come of PPIs. She is scheduled to see Dr. Bryan Lemma 03/06/2019 to further discuss having a TIF procedure.   She presents today for further evaluation regarding LUQ pain. She developed severe LUQ pain which radiated up into her chest.  She presented to the [facility] ED on 02/10/2019 with epigastric pain. A 12 lead EKG was normal. Troponin was negative. CMP and CBC were normal. No further cardiac evaluation was recommended. She was discharged home with the recommendation to schedule a GI follow up.    CBC Latest Ref Rng & Units 02/10/2019 05/17/2017 10/14/2016  WBC 4.0 - 10.5 K/uL 8.5 6.4 8.0  Hemoglobin 12.0 - 15.0 g/dL 13.5 13.5 13.1  Hematocrit 36.0 - 46.0 % 41.3 40.0 39.9  Platelets 150 - 400 K/uL 246 224.0 231.0   CMP Latest Ref Rng & Units 02/10/2019 05/09/2018 11/03/2017  Glucose 70 - 99 mg/dL 138(H) 94 154(H)  BUN 8  - 23 mg/dL 19 26(H) 29(H)  Creatinine 0.44 - 1.00 mg/dL 0.69 0.93 0.84  Sodium 135 - 145 mmol/L 141 141 138  Potassium 3.5 - 5.1 mmol/L 4.7 4.3 4.1  Chloride 98 - 111 mmol/L 104 101 100  CO2 22 - 32 mmol/L 28 27 29   Calcium 8.9 - 10.3 mg/dL 10.0 9.9 9.8  Total Protein 6.5 - 8.1 g/dL 8.0 7.0 6.9  Total Bilirubin 0.3 - 1.2 mg/dL 0.4 0.4 0.4  Alkaline Phos 38 - 126 U/L 97 89 83  AST 15 - 41 U/L 21 20 19   ALT 0 - 44 U/L 15 17 18     Barium swallow 01/24/2019: 1. Nonspecific esophageal motility disorder. 2. Tertiary contractions noted in the middle and distal thirds of the esophagus on nearly all swallows. No evidence for overt presbyesophagus. 3. No mass lesion or stricture evident. 13 mm barium tablet passes readily into the stomach.  EGD 01/06/19: - A 1 cm hiatal hernia was present. - The examined esophagus was tortuous. - There was a subtle stenosis at the UES. The exam of the esophagus was otherwise normal. No esophagitis or other strictures appreciated. - A guidewire was placed and the scope was withdrawn. Dilation was performed in the entire esophagus with a Savary dilator with mild resistance at 16 mm and moderate resistance at 17 mm. Relook endoscopy showed small heme at the UES. - A few small sessile polyps were found in the gastric fundus and in the gastric body. Not sampled on this exam, benign polyps previously sampled on last exam. -  The exam of the stomach was otherwise normal. Hill grade I views of the cardia noted. - The duodenal bulb and second portion of the duodenum were normal  EGD 07/19/2017: esophagus normal. Dilation was performed in the entire esophagus with a Savary dilator with mild resistance at 16 mm and 17 mm. Relook endoscopy showed an appropriate musocal wrent in the proximal esophagus / UES. A single 7 mm sessile polyp was found in the gastric body- removed via hot snare, benign.Other smaller (<31mm) benign appearing gastric polyps noted but not removed.  The exam of the stomach was otherwise normal. Biopsies negative for HP,duodenum was normal   Colonoscopy 03/01/2015: There were two sessile polyps in the sigmoid colon roughly 83mm in size each, and both were removed with cold forceps. There was mild pancolonic diverticulosis noted. The colonic mucosa was otherwise normal without inflammatory changes. Biopsies were taken from the right and left colon to rule out microscopic colitis. The terminal ileum was normal. The surgical anastosmosis in the sigmoid colon was widely patent and appeared normal. Retroflexed views revealed internal hemorrhoids.Biopsies showed benign colonic mucosa.   Abdominal/pelvic CT with contrast 05/28/2017: No radiographic evidence of diverticulitis or other acute findings. Large stool burden noted; recommend clinical correlation for possible constipation  Current Outpatient Medications on File Prior to Visit  Medication Sig Dispense Refill  . ALPRAZolam (XANAX) 0.5 MG tablet TAKE 1 TABLET(0.5 MG) BY MOUTH AT BEDTIME AS NEEDED FOR SLEEP 30 tablet 0  . amitriptyline (ELAVIL) 25 MG tablet Take 1 tablet (25 mg total) by mouth at bedtime. 90 tablet 3  . baclofen (LIORESAL) 10 MG tablet Take 1 tablet (10 mg total) by mouth 2 (two) times daily.  0  . BD PEN NEEDLE NANO 2ND GEN 32G X 4 MM MISC daily. as directed    . Cholecalciferol (VITAMIN D3) 2000 UNITS TABS Take 1 tablet by mouth daily.     . diclofenac Sodium (VOLTAREN) 1 % GEL Apply topically.    . gabapentin (NEURONTIN) 300 MG capsule Take 1 in the am 1 around 2pm and 2 at hs 120 capsule 6  . hyoscyamine (LEVSIN SL) 0.125 MG SL tablet DISSOLVE 1 TABLET(0.125 MG) UNDER THE TONGUE EVERY 6 HOURS AS NEEDED FOR CRAMPING 90 tablet 1  . Lancets (ONETOUCH DELICA PLUS Q000111Q) MISC 2 (two) times daily. as directed    . LEVEMIR FLEXTOUCH 100 UNIT/ML Pen Inject 6 Units into the skin at bedtime.     [name] levothyroxine (SYNTHROID, LEVOTHROID) 50 MCG tablet TK 1 T PO QD  12  .  Lifitegrast (XIIDRA) 5 % SOLN Apply to eye.    . Melatonin 5 MG TABS Take 1 tablet by mouth at bedtime.    . Multiple Vitamin (MULTIVITAMIN) tablet Take 1 tablet by mouth daily.      Glory Rosebush VERIO test strip 2 (two) times daily. for testing  5  . pantoprazole (PROTONIX) 40 MG tablet Take 1 tablet (40 mg total) by mouth 2 (two) times daily. 180 tablet 1  . Probiotic Product (PROBIOTIC PO) Take 1 tablet by mouth daily.    . rosuvastatin (CRESTOR) 10 MG tablet Take 1 tablet (10 mg total) by mouth daily. 90 tablet 1  . sucralfate (CARAFATE) 1 GM/10ML suspension Take 10 mLs (1 g total) by mouth every 6 (six) hours as needed. 420 mL 1   No current facility-administered medications on file prior to visit.    Allergies  Allergen Reactions  . Rocephin [Ceftriaxone] Anaphylaxis  . Prednisone Other (  See Comments)    High blood sugars (pt reports)  . Other     VERY DELICATE SKIN FROM MELANOMA   . Benadryl [Diphenhydramine Hcl] Anxiety    Hyperactivity     Current Medications, Allergies, Past Medical History, Past Surgical History, Family History and Social History were reviewed in [employer] record.   Physical Exam: BP 110/66 (BP Location: Left Arm, Patient Position: Sitting, Cuff Size: Normal)   Pulse 96   Temp 98.6 F (37 C)   Ht 5' 3.25" (1.607 m) Comment: height measured without shoes  Wt 134 lb 6 oz (61 kg)   BMI 23.62 kg/m  General: Well developed  74 year old female in no acute distress Head: Normocephalic and atraumatic Eyes:  Sclerae anicteric, conjunctiva pink  Ears: Normal auditory acuity Mouth: No ulcers or lesions Lungs: Clear throughout to auscultation Heart: Regular rate and rhythm, no murmur Abdomen: Soft, nondistended, moderate LUQ to left mid abdomen without rebound or guarding. No masses, no hepatomegaly. Normal bowel sounds x 4 quads.  Rectal: Deferred Musculoskeletal: Symmetrical with no gross deformities  Extremities: No edema    Neurological: Alert oriented x 4, grossly nonfocal Psychological:  Alert and cooperative. Normal mood and affect  Assessment and Recommendations:  58. 74 year old female with a history of GERD, dysphagia, under consideration for a future TIF procedure presents with new LUQ to left mid abdominal pain.  -CBC, CMP, CRP and lipase level -Abdominal/pelvic CT with contrast -Continue Pantoprazole 40mg  po bid for now. Famotidine 20mg  at bed time.  -Keep appt with Dr. Bryan Lemma as scheduled   2. History of diverticulitis. S/P left colon resection in the 1990's secondary to diverticular disease. Last treated with Augmentin for LLQ pain/presumed diverticulitis 01/27/2019. LLQ pain resolved.  3. Colon cancer screening. Colonoscopy 02/2015 2 polyps removed, biopsies showed benign colonic mucosa -Next colonoscopy due 02/2025 or earlier if clinically indicated

## 2019-02-15 NOTE — Patient Instructions (Addendum)
If you are age 74 or older, your body mass index should be between 23-30. Your Body mass index is 23.62 kg/m. If this is out of the aforementioned range listed, please consider follow up with your Primary Care Provider.  If you are age 64 or younger, your body mass index should be between 19-25. Your Body mass index is 23.62 kg/m. If this is out of the aformentioned range listed, please consider follow up with your Primary Care Provider.   You have been scheduled for a CT scan of the abdomen and pelvis at [facility] ([address]---this is in the same building as [facility]).   You are scheduled on 02/21/2019 at 8:30am. You should arrive 15 minutes prior to your appointment time for registration. Please follow the written instructions below on the day of your exam:  WARNING: IF YOU ARE ALLERGIC TO IODINE/X-RAY DYE, PLEASE NOTIFY RADIOLOGY IMMEDIATELY AT [phone]! YOU WILL BE GIVEN A 13 HOUR PREMEDICATION PREP.  1) Do not eat or drink anything 4 hours prior to your test) 2) You have been given 2 bottles of oral contrast to drink. The solution may taste better if refrigerated, but do NOT add ice or any other liquid to this solution. Shake well before drinking.    Drink 1 bottle of contrast @ 6:30 am  (2 hours prior to your exam)  Drink 1 bottle of contrast @ 7:30 am (1 hour prior to your exam)  You may take any medications as prescribed with a small amount of water, if necessary. If you take any of the following medications: METFORMIN, GLUCOPHAGE, GLUCOVANCE, AVANDAMET, RIOMET, FORTAMET, ACTOPLUS MET, JANUMET, GLUMETZA or METAGLIP, you MAY be asked to HOLD this medication 48 hours AFTER the exam.  The purpose of you drinking the oral contrast is to aid in the visualization of your intestinal tract. The contrast solution may cause some diarrhea. Depending on your individual set of symptoms, you may also receive an intravenous injection of x-ray contrast/dye. Plan on being  at [facility] for 30 minutes or longer, depending on the type of exam you are having performed.  This test typically takes 30-45 minutes to complete.  If you have any questions regarding your exam or if you need to reschedule, you may call the CT department at [phone] between the hours of 8:00 am and 5:00 pm, Monday-Friday.  ________________________________________________________________________ We have sent the following medications to your pharmacy for you to pick up at your convenience: Famotadine 20 mg  Keep your appointment with Dr Cirigliano as previously scheduled  Push fluids and stay on a bland diet.   Due to recent changes in healthcare laws, you may see the results of your imaging and laboratory studies on MyChart before your provider has had a chance to review them.  We understand that in some cases there may be results that are confusing or concerning to you. Not all laboratory results come back in the same time frame and the provider may be waiting for multiple results in order to interpret others.  Please give us 48 hours in order for your provider to thoroughly review all the results before contacting the office for clarification of your results.   

## 2019-02-21 ENCOUNTER — Ambulatory Visit (HOSPITAL_COMMUNITY)
Admission: RE | Admit: 2019-02-21 | Discharge: 2019-02-21 | Disposition: A | Discharge status: Home / Self Care | Payer: Medicare Other | Source: Ambulatory Visit | Attending: Nurse Practitioner | Admitting: Nurse Practitioner

## 2019-02-21 ENCOUNTER — Encounter (HOSPITAL_COMMUNITY): Payer: Self-pay

## 2019-02-21 ENCOUNTER — Other Ambulatory Visit: Payer: Self-pay

## 2019-02-21 DIAGNOSIS — R1032 Left lower quadrant pain: Secondary | ICD-10-CM | POA: Diagnosis not present

## 2019-02-21 DIAGNOSIS — R1012 Left upper quadrant pain: Secondary | ICD-10-CM | POA: Diagnosis not present

## 2019-02-21 DIAGNOSIS — R109 Unspecified abdominal pain: Secondary | ICD-10-CM | POA: Diagnosis not present

## 2019-02-21 MED ORDER — IOHEXOL 300 MG/ML  SOLN
100.0000 mL | Freq: Once | INTRAMUSCULAR | Status: AC | PRN
  Administered 2019-02-21: 100 mL via INTRAVENOUS

## 2019-02-21 MED ORDER — SODIUM CHLORIDE (PF) 0.9 % IJ SOLN
INTRAMUSCULAR | Status: AC
  Filled 2019-02-21: qty 50

## 2019-02-23 ENCOUNTER — Ambulatory Visit: Payer: Self-pay | Admitting: Neurology

## 2019-03-02 ENCOUNTER — Other Ambulatory Visit: Payer: Self-pay | Admitting: General Practice

## 2019-03-02 NOTE — Telephone Encounter (Signed)
Last OV 02/03/19 Alprazolam last filled 02/02/19 #30 with 0

## 2019-03-03 ENCOUNTER — Ambulatory Visit (INDEPENDENT_AMBULATORY_CARE_PROVIDER_SITE_OTHER): Payer: Medicare Other | Admitting: *Deleted

## 2019-03-03 ENCOUNTER — Other Ambulatory Visit: Payer: Self-pay

## 2019-03-03 DIAGNOSIS — E785 Hyperlipidemia, unspecified: Secondary | ICD-10-CM

## 2019-03-03 LAB — CBC WITH DIFFERENTIAL/PLATELET
Basophils Absolute: 0 10*3/uL (ref 0.0–0.1)
Basophils Relative: 0.7 % (ref 0.0–3.0)
Eosinophils Absolute: 0.1 10*3/uL (ref 0.0–0.7)
Eosinophils Relative: 2.1 % (ref 0.0–5.0)
HCT: 37.8 % (ref 36.0–46.0)
Hemoglobin: 12.7 g/dL (ref 12.0–15.0)
Lymphocytes Relative: 26.3 % (ref 12.0–46.0)
Lymphs Abs: 1.3 10*3/uL (ref 0.7–4.0)
MCHC: 33.7 g/dL (ref 30.0–36.0)
MCV: 89.6 fl (ref 78.0–100.0)
Monocytes Absolute: 0.5 10*3/uL (ref 0.1–1.0)
Monocytes Relative: 10.4 % (ref 3.0–12.0)
Neutro Abs: 3.1 10*3/uL (ref 1.4–7.7)
Neutrophils Relative %: 60.5 % (ref 43.0–77.0)
Platelets: 246 10*3/uL (ref 150.0–400.0)
RBC: 4.22 Mil/uL (ref 3.87–5.11)
RDW: 13.6 % (ref 11.5–15.5)
WBC: 5.1 10*3/uL (ref 4.0–10.5)

## 2019-03-03 LAB — LIPID PANEL
Cholesterol: 171 mg/dL (ref 0–200)
HDL: 64.1 mg/dL (ref >39.00)
LDL Cholesterol: 89 mg/dL (ref 0–99)
NonHDL: 106.94
Total CHOL/HDL Ratio: 3
Triglycerides: 88 mg/dL (ref 0.0–149.0)
VLDL: 17.6 mg/dL (ref 0.0–40.0)

## 2019-03-03 LAB — BASIC METABOLIC PANEL
BUN: 30 mg/dL — ABNORMAL HIGH (ref 6–23)
CO2: 31 mEq/L (ref 19–32)
Calcium: 9.6 mg/dL (ref 8.4–10.5)
Chloride: 100 mEq/L (ref 96–112)
Creatinine, Ser: 0.9 mg/dL (ref 0.40–1.20)
GFR: 61.35 mL/min (ref >60.00)
Glucose, Bld: 134 mg/dL — ABNORMAL HIGH (ref 70–99)
Potassium: 5.1 mEq/L (ref 3.5–5.1)
Sodium: 139 mEq/L (ref 135–145)

## 2019-03-03 LAB — HEPATIC FUNCTION PANEL
ALT: 10 U/L (ref 0–35)
AST: 15 U/L (ref 0–37)
Albumin: 4 g/dL (ref 3.5–5.2)
Alkaline Phosphatase: 98 U/L (ref 39–117)
Bilirubin, Direct: 0.1 mg/dL (ref 0.0–0.3)
Total Bilirubin: 0.3 mg/dL (ref 0.2–1.2)
Total Protein: 6.8 g/dL (ref 6.0–8.3)

## 2019-03-03 LAB — TSH: TSH: 4.84 u[IU]/mL — ABNORMAL HIGH (ref 0.35–4.50)

## 2019-03-03 MED ORDER — ALPRAZOLAM 0.5 MG PO TABS: ORAL_TABLET | ORAL | 3 refills | Status: DC

## 2019-03-06 ENCOUNTER — Encounter: Payer: Self-pay | Admitting: Gastroenterology

## 2019-03-06 ENCOUNTER — Ambulatory Visit (INDEPENDENT_AMBULATORY_CARE_PROVIDER_SITE_OTHER): Payer: Medicare Other | Admitting: Gastroenterology

## 2019-03-06 VITALS — BP 102/58 | HR 84 | Temp 97.6°F | Ht 63.5 in | Wt 134.0 lb

## 2019-03-06 DIAGNOSIS — K449 Diaphragmatic hernia without obstruction or gangrene: Secondary | ICD-10-CM | POA: Diagnosis not present

## 2019-03-06 DIAGNOSIS — K219 Gastro-esophageal reflux disease without esophagitis: Secondary | ICD-10-CM | POA: Diagnosis not present

## 2019-03-06 DIAGNOSIS — R1310 Dysphagia, unspecified: Secondary | ICD-10-CM | POA: Diagnosis not present

## 2019-03-06 NOTE — Patient Instructions (Addendum)
If you are age 74 or older, your body mass index should be between 23-30. Your Body mass index is 23.36 kg/m. If this is out of the aforementioned range listed, please consider follow up with your Primary Care Provider.  If you are age 74 or younger, your body mass index should be between 19-25. Your Body mass index is 23.36 kg/m. If this is out of the aformentioned range listed, please consider follow up with your Primary Care Provider.   You have been scheduled for an esophageal manometry test at [facility] on 03/17/19 at  8:30. Please arrive 30 minutes prior to your procedure for registration. You will need to go to outpatient registration (1st floor of the hospital) first. Make certain to bring your insurance cards as well as a complete list of medications.  Please remember the following:  1) Do not take any muscle relaxants, xanax (alprazolam) or ativan for 1 day prior to your test as well as the day of the test.  2) Nothing to eat or drink after 12:00 midnight on the night before your test.  3) Hold all diabetic medications/insulin the morning of the test. You may eat and take your medications after the test.  It will take at least 2 weeks to receive the results of this test from your physician.  ------------------------------------------ ABOUT ESOPHAGEAL MANOMETRY Esophageal manometry (muh-NOM-uh-tree) is a test that gauges how well your esophagus works. Your esophagus is the long, muscular tube that connects your throat to your stomach. Esophageal manometry measures the rhythmic muscle contractions (peristalsis) that occur in your esophagus when you swallow. Esophageal manometry also measures the coordination and force exerted by the muscles of your esophagus.  During esophageal manometry, a thin, flexible tube (catheter) that contains sensors is passed through your nose, down your esophagus and into your stomach. Esophageal manometry can be helpful in diagnosing some mostly  uncommon disorders that affect your esophagus.  Why it's done Esophageal manometry is used to evaluate the movement (motility) of food through the esophagus and into the stomach. The test measures how well the circular bands of muscle (sphincters) at the top and bottom of your esophagus open and close, as well as the pressure, strength and pattern of the wave of esophageal muscle contractions that moves food along.  What you can expect Esophageal manometry is an outpatient procedure done without sedation. Most people tolerate it well. You may be asked to change into a hospital gown before the test starts.  During esophageal manometry  . While you are sitting up, a member of your health care team sprays your throat with a numbing medication or puts numbing gel in your nose or both.  . A catheter is guided through your nose into your esophagus. The catheter may be sheathed in a water-filled sleeve. It doesn't interfere with your breathing. However, your eyes may water, and you may gag. You may have a slight nosebleed from irritation.  . After the catheter is in place, you may be asked to lie on your back on an exam table, or you may be asked to remain seated.  . You then swallow small sips of water. As you do, a computer connected to the catheter records the pressure, strength and pattern of your esophageal muscle contractions.  . During the test, you'll be asked to breathe slowly and smoothly, remain as still as possible, and swallow only when you're asked to do so.  . A member of your health care team may move  the catheter down into your stomach while the catheter continues its measurements.  . The catheter then is slowly withdrawn. The test usually lasts 20 to 30 minutes.  After esophageal manometry  When your esophageal manometry is complete, you may return to your normal activities  This test typically takes 30-45 minutes to  complete. ________________________________________________________________________  Thank you for choosing me and [facility].   Vito Cirigliano, DO  Due to recent COVID-19 restrictions implemented by our local and state authorities and in an effort to keep both patients and staff as safe as possible, our hospital system now requires COVID-19 testing prior to any scheduled hospital procedure. Please go to our [facility] location drive thru testing site ([address] [place], [place] [address]) on 03/14/19 at  8:55 am. There will be multiple testing areas, the first checkpoint being for pre-procedure/surgery testing. Get into the right (yellow) lane that leads to the PAT testing team. You will not be billed at the time of testing but may receive a bill later depending on your insurance. The approximate cost of the test is $100. You must agree to quarantine from the time of your testing until the procedure date on 03/17/19 . This should include staying at home with ONLY the people you live with. Avoid take-out, grocery store shopping or leaving the house for any non-emergent reason. Failure to have your COVID-19 test done on the date and time you have been scheduled will result in cancellation of procedure. Please call our office at [phone] if you have any questions.

## 2019-03-06 NOTE — Progress Notes (Signed)
P  Chief Complaint:    GERD, discuss Transoral Incisionless Fundoplication (TIF)  Referring Physician: Dr. Havery Moros  GERD History: 74 year old female with a past medical history of anxiety, DM II on insulin, cervical spondylosis (C4-7 anterior fusion), prior Lyme disease,  Hypothyroidism, osteopenia, trigeminal neuralgia, melanoma, HOH and GERD and diverticulitis referred to me by Dr. Havery Moros for evaluation of possible antireflux intervention with Transoral Incisionless Fundoplication (TIF) with a goal to stop or significantly reduce acid suppression therapy.  She is a longstanding history of reflux, and has been on acid suppression therapy for well over 25 years.  Medications worked well initially, but all eventually lost efficacy.  Most recently was restarted (had taken years ago) on Protonix, then uptitrated to bid dosing.  Still has intermittent breakthrough symptoms.  Does have intermittent dysphagia to solids, which is typically relieved with EGD and empiric dilation, presumably from UES etiology (has had mucosal) the past).  Most recently EGD in 12/2018, again with Savary dilation with clinical improvement.  She has started develop some very mild, intermittent dysphagia since then.  No history of food impactions.  GERD history: -Index symptoms: Heartburn, regurgitation, dyspepsia -Medications trialed: Omeprazole, pantoprazole -Current medications: Protonix 40 mg bid, sucralfate -Complications: Peptic stricture  GERD evaluation: -EGD (01/06/2019, Dr. Havery Moros): 1 cm HH, tortuous esophagus, subtle stenosis at UES dilated with 16 and 17 mm Savary with small heme at UES.  Fundic gland polyps.  Hill grade 1 -EGD (07/19/2017, Dr. Havery Moros): esophagus normal. Dilation was performed in the entire esophagus with a Savary dilator with mild resistance at 16 mm and 17 mm. [facility] endoscopy showed an appropriate musocal wrent in the proximal esophagus / UES. A single 7 mm sessile polyp was  found in the gastric body- removed via hot snare, benign.Other smaller (<62mm) benign appearing gastric polyps noted but not removed. The exam of the stomach was otherwise normal. Biopsies negative for HP,duodenum was normal -Barium esophagram (01/2019): Disrupted primary peristalsis in midesophagus with tertiary contractions noted and no overt presbyesophagus.  13 mm only. -Barium esophagram (05/2005): Mild diffuse spasm without obstruction.  13 mm tablet passes readily.  No reflux noted. -Barium esophagram (04/2003): Significant reflux -Esophageal Manometry: -pH/Impedance: -GES (04/2006): Normal  Additional GI Hx: -Diverticulosis: Partial left-sided colon resection secondary to diverticular disease in the 1990s.  Recently seen in 01/2019 for LLQ pain x2 weeks, treated with Augmentin x10 days with resolution.  -Colonoscopy (03/01/2015): 2 polyps were removed from the sigmoid colon. Biopsies benign colonic mucosa and not true colon polyps. She was advised to repeat a colonoscopy in 10 years  -Seen by Carl Best on 02/15/2019 for LUQ pain.  Was seen in the [place] ER for the same issue on 02/10/2019 work-up unremarkable, to include EKG, troponin, CBC, CMP.  CT on 02/21/2019 with no acute GI/abdominal issues, moderate to large stool throughout colon, hemangiomas atherosclerosis    HPI:     Patient is a 74 y.o. female presenting to the Gastroenterology Clinic for evaluation of TIF as outlined above.  Her reflux history was reviewed in detail and outlined above.  She is very interested in antireflux surgical options as a means to better control her reflux along with stopping or significantly reducing the need for acid suppression therapy.  Reviewed most recent labs from 03/03/2019: Normal CBC, liver enzymes, BMP (BUN/creatinine 30/0.9).  TSH 4.84.  I reviewed recent imaging studies and previous endoscopic evaluations as outlined above.  All reviewed with the patient in detail  today.  Review of systems:     No chest pain, no SOB, no fevers, no urinary sx   Past Medical History:  Diagnosis Date  . Abdominal pain    EGD and CTs neg, rx Amitriptiline (non ulcer dyspepsia) EGD 12/11.. s/p ballon dilitation  . Anxiety   . Cataract   . Cervical spondylosis without myelopathy 11/30/2012  . Chronic conjunctivitis   . Diabetes mellitus    no meds, diet controlled  . Diverticulosis of colon   . GERD with stricture    s/p dilatations  . History of Lyme disease   . HOH (hard of hearing)    bilateral hearing aids  . Hyperlipidemia   . Hypothyroidism     Dx 10-9 TSH 5.2 , then TSH normalized, eventually went up again 2012  . Melanoma ([place])    h/o  . Memory difficulties 06/05/2014  . Osteopenia   . Solar lentigo    face; s/p excision 3/09. See Derm q 6 months  . Trigeminal neuralgia    h/o     Patient's surgical history, family medical history, social history, medications and allergies were all reviewed in Epic    Current Outpatient Medications  Medication Sig Dispense Refill  . ALPRAZolam (XANAX) 0.5 MG tablet TAKE 1 TABLET(0.5 MG) BY MOUTH AT BEDTIME AS NEEDED FOR SLEEP 30 tablet 3  . amitriptyline (ELAVIL) 25 MG tablet Take 1 tablet (25 mg total) by mouth at bedtime. 90 tablet 3  . baclofen (LIORESAL) 10 MG tablet Take 1 tablet (10 mg total) by mouth 2 (two) times daily.  0  . BD PEN NEEDLE NANO 2ND GEN 32G X 4 MM MISC daily. as directed    . Cholecalciferol (VITAMIN D3) 2000 UNITS TABS Take 1 tablet by mouth daily.     . diclofenac Sodium (VOLTAREN) 1 % GEL Apply topically.    . famotidine (PEPCID) 20 MG tablet TAKE 1 TABLET(20 MG) BY MOUTH AT BEDTIME 90 tablet 1  . gabapentin (NEURONTIN) 300 MG capsule Take 1 in the am 1 around 2pm and 2 at hs 120 capsule 6  . hyoscyamine (LEVSIN SL) 0.125 MG SL tablet DISSOLVE 1 TABLET(0.125 MG) UNDER THE TONGUE EVERY 6 HOURS AS NEEDED FOR CRAMPING 90 tablet 1  . Lancets (ONETOUCH DELICA PLUS Q000111Q) MISC 2 (two)  times daily. as directed    . LEVEMIR FLEXTOUCH 100 UNIT/ML Pen Inject 6 Units into the skin at bedtime.     [name] levothyroxine (SYNTHROID, LEVOTHROID) 50 MCG tablet TK 1 T PO QD  12  . Lifitegrast (XIIDRA) 5 % SOLN Apply to eye.    . Melatonin 5 MG TABS Take 1 tablet by mouth at bedtime.    . Multiple Vitamin (MULTIVITAMIN) tablet Take 1 tablet by mouth daily.      Glory Rosebush VERIO test strip 2 (two) times daily. for testing  5  . pantoprazole (PROTONIX) 40 MG tablet Take 1 tablet (40 mg total) by mouth 2 (two) times daily. 180 tablet 1  . Probiotic Product (PROBIOTIC PO) Take 1 tablet by mouth daily.    . rosuvastatin (CRESTOR) 10 MG tablet Take 1 tablet (10 mg total) by mouth daily. 90 tablet 1  . sucralfate (CARAFATE) 1 GM/10ML suspension Take 10 mLs (1 g total) by mouth every 6 (six) hours as needed. 420 mL 1   No current facility-administered medications for this visit.    Physical Exam:     BP (!) 102/58   Pulse 84   Temp 97.6 F (  36.4 C)   Ht 5' 3.5" (1.613 m)   Wt 134 lb (60.8 kg)   BMI 23.36 kg/m   GENERAL:  Pleasant female in NAD PSYCH: : Cooperative, normal affect EENT:  conjunctiva pink, mucous membranes moist, neck supple without masses SKIN:  turgor, no lesions seen Musculoskeletal:  Normal muscle tone, normal strength NEURO: Alert and oriented x 3, no focal neurologic deficits   IMPRESSION and PLAN:    1) GERD 2) Dysphagia 3) Hiatal hernia 74 year old female with a longstanding history of reflux.  Has clear objective evidence of reflux as manifested on previous esophagram with significant reflux noted.  Additionally, history of peptic stricture.  We had a long discussion today regarding the pathophysiology of reflux, to include complications of reflux such as Barrett's, EAC, peptic stricture, erosive esophagitis, etc.  Given decreasing medication efficacy, and strong desire to decrease polypharmacy, she would like to proceed with evaluation for TIF as outlined  below  -Resume pantoprazole and Carafate currently prescribed -Resume antireflux lifestyle/dietary modifications -Plan for Esophageal Manometry to ensure no motility disorder.  I reviewed her recent upper GI series.  Feel that the subtle motility delay is more likely reflux burnout, but want to make sure that she has enough contractile reserve to overcome high pressure zone with TIF -Despite history of cervical spine fusion, has very good mobility -Pending results of Esophageal Manometry, think she would be an otherwise good candidate for TIF -Plan for hiatal hernia repair with TIF -Discussed postoperative dietary and exercise/activity limitations at length today -Current protocol is for overnight admission for observation.  Discussed that at length today along with sedation plan -Follow-up with me after Esophageal Manometry  I spent 50 minutes of time, including in depth chart review, independent review of results as outlined above, communicating results with the patient directly, face-to-face time with the patient, coordinating care, ordering studies and medications as appropriate, and documentation.           Lavena Bullion ,DO, FACG 03/06/2019, 2:04 PM

## 2019-03-06 NOTE — H&P (View-Only) (Signed)
P  Chief Complaint:    GERD, discuss Transoral Incisionless Fundoplication (TIF)  Referring Physician: Dr. Havery Moros  GERD History: 74 year old female with a past medical history of anxiety, DM II on insulin, cervical spondylosis (C4-7 anterior fusion), prior Lyme disease,  Hypothyroidism, osteopenia, trigeminal neuralgia, melanoma, HOH and GERD and diverticulitis referred to me by Dr. Havery Moros for evaluation of possible antireflux intervention with Transoral Incisionless Fundoplication (TIF) with a goal to stop or significantly reduce acid suppression therapy.  She is a longstanding history of reflux, and has been on acid suppression therapy for well over 25 years.  Medications worked well initially, but all eventually lost efficacy.  Most recently was restarted (had taken years ago) on Protonix, then uptitrated to bid dosing.  Still has intermittent breakthrough symptoms.  Does have intermittent dysphagia to solids, which is typically relieved with EGD and empiric dilation, presumably from UES etiology (has had mucosal) the past).  Most recently EGD in 12/2018, again with Savary dilation with clinical improvement.  She has started develop some very mild, intermittent dysphagia since then.  No history of food impactions.  GERD history: -Index symptoms: Heartburn, regurgitation, dyspepsia -Medications trialed: Omeprazole, pantoprazole -Current medications: Protonix 40 mg bid, sucralfate -Complications: Peptic stricture  GERD evaluation: -EGD (01/06/2019, Dr. Havery Moros): 1 cm HH, tortuous esophagus, subtle stenosis at UES dilated with 16 and 17 mm Savary with small heme at UES.  Fundic gland polyps.  Hill grade 1 -EGD (07/19/2017, Dr. Havery Moros): esophagus normal. Dilation was performed in the entire esophagus with a Savary dilator with mild resistance at 16 mm and 17 mm. Relook endoscopy showed an appropriate musocal wrent in the proximal esophagus / UES. A single 7 mm sessile polyp was  found in the gastric body- removed via hot snare, benign.Other smaller (<34mm) benign appearing gastric polyps noted but not removed. The exam of the stomach was otherwise normal. Biopsies negative for HP,duodenum was normal -Barium esophagram (01/2019): Disrupted primary peristalsis in midesophagus with tertiary contractions noted and no overt presbyesophagus.  13 mm only. -Barium esophagram (05/2005): Mild diffuse spasm without obstruction.  13 mm tablet passes readily.  No reflux noted. -Barium esophagram (04/2003): Significant reflux -Esophageal Manometry: -pH/Impedance: -GES (04/2006): Normal  Additional GI Hx: -Diverticulosis: Partial left-sided colon resection secondary to diverticular disease in the 1990s.  Recently seen in 01/2019 for LLQ pain x2 weeks, treated with Augmentin x10 days with resolution.  -Colonoscopy (03/01/2015): 2 polyps were removed from the sigmoid colon. Biopsies benign colonic mucosa and not true colon polyps. She was advised to repeat a colonoscopy in 10 years  -Seen by Carl Best on 02/15/2019 for LUQ pain.  Was seen in the [facility] for the same issue on 02/10/2019 work-up unremarkable, to include EKG, troponin, CBC, CMP.  CT on 02/21/2019 with no acute GI/abdominal issues, moderate to large stool throughout colon, hemangiomas atherosclerosis    HPI:     Patient is a 74 y.o. female presenting to the Gastroenterology Clinic for evaluation of TIF as outlined above.  Her reflux history was reviewed in detail and outlined above.  She is very interested in antireflux surgical options as a means to better control her reflux along with stopping or significantly reducing the need for acid suppression therapy.  Reviewed most recent labs from 03/03/2019: Normal CBC, liver enzymes, BMP (BUN/creatinine 30/0.9).  TSH 4.84.  I reviewed recent imaging studies and previous endoscopic evaluations as outlined above.  All reviewed with the patient in detail  today.  Review of systems:     No chest pain, no SOB, no fevers, no urinary sx   Past Medical History:  Diagnosis Date  . Abdominal pain    EGD and CTs neg, rx Amitriptiline (non ulcer dyspepsia) EGD 12/11.. s/p ballon dilitation  . Anxiety   . Cataract   . Cervical spondylosis without myelopathy 11/30/2012  . Chronic conjunctivitis   . Diabetes mellitus    no meds, diet controlled  . Diverticulosis of colon   . GERD with stricture    s/p dilatations  . History of Lyme disease   . HOH (hard of hearing)    bilateral hearing aids  . Hyperlipidemia   . Hypothyroidism     Dx 10-9 TSH 5.2 , then TSH normalized, eventually went up again 2012  . Melanoma ([place])    h/o  . Memory difficulties 06/05/2014  . Osteopenia   . Solar lentigo    face; s/p excision 3/09. See Derm q 6 months  . Trigeminal neuralgia    h/o     Patient's surgical history, family medical history, social history, medications and allergies were all reviewed in Epic    Current Outpatient Medications  Medication Sig Dispense Refill  . ALPRAZolam (XANAX) 0.5 MG tablet TAKE 1 TABLET(0.5 MG) BY MOUTH AT BEDTIME AS NEEDED FOR SLEEP 30 tablet 3  . amitriptyline (ELAVIL) 25 MG tablet Take 1 tablet (25 mg total) by mouth at bedtime. 90 tablet 3  . baclofen (LIORESAL) 10 MG tablet Take 1 tablet (10 mg total) by mouth 2 (two) times daily.  0  . BD PEN NEEDLE NANO 2ND GEN 32G X 4 MM MISC daily. as directed    . Cholecalciferol (VITAMIN D3) 2000 UNITS TABS Take 1 tablet by mouth daily.     . diclofenac Sodium (VOLTAREN) 1 % GEL Apply topically.    . famotidine (PEPCID) 20 MG tablet TAKE 1 TABLET(20 MG) BY MOUTH AT BEDTIME 90 tablet 1  . gabapentin (NEURONTIN) 300 MG capsule Take 1 in the am 1 around 2pm and 2 at hs 120 capsule 6  . hyoscyamine (LEVSIN SL) 0.125 MG SL tablet DISSOLVE 1 TABLET(0.125 MG) UNDER THE TONGUE EVERY 6 HOURS AS NEEDED FOR CRAMPING 90 tablet 1  . Lancets (ONETOUCH DELICA PLUS Q000111Q) MISC 2 (two)  times daily. as directed    . LEVEMIR FLEXTOUCH 100 UNIT/ML Pen Inject 6 Units into the skin at bedtime.     [name] levothyroxine (SYNTHROID, LEVOTHROID) 50 MCG tablet TK 1 T PO QD  12  . Lifitegrast (XIIDRA) 5 % SOLN Apply to eye.    . Melatonin 5 MG TABS Take 1 tablet by mouth at bedtime.    . Multiple Vitamin (MULTIVITAMIN) tablet Take 1 tablet by mouth daily.      Glory Rosebush VERIO test strip 2 (two) times daily. for testing  5  . pantoprazole (PROTONIX) 40 MG tablet Take 1 tablet (40 mg total) by mouth 2 (two) times daily. 180 tablet 1  . Probiotic Product (PROBIOTIC PO) Take 1 tablet by mouth daily.    . rosuvastatin (CRESTOR) 10 MG tablet Take 1 tablet (10 mg total) by mouth daily. 90 tablet 1  . sucralfate (CARAFATE) 1 GM/10ML suspension Take 10 mLs (1 g total) by mouth every 6 (six) hours as needed. 420 mL 1   No current facility-administered medications for this visit.    Physical Exam:     BP (!) 102/58   Pulse 84   Temp 97.6 F (  36.4 C)   Ht 5' 3.5" (1.613 m)   Wt 134 lb (60.8 kg)   BMI 23.36 kg/m   GENERAL:  Pleasant female in NAD PSYCH: : Cooperative, normal affect EENT:  conjunctiva pink, mucous membranes moist, neck supple without masses SKIN:  turgor, no lesions seen Musculoskeletal:  Normal muscle tone, normal strength NEURO: Alert and oriented x 3, no focal neurologic deficits   IMPRESSION and PLAN:    1) GERD 2) Dysphagia 3) Hiatal hernia 74 year old female with a longstanding history of reflux.  Has clear objective evidence of reflux as manifested on previous esophagram with significant reflux noted.  Additionally, history of peptic stricture.  We had a long discussion today regarding the pathophysiology of reflux, to include complications of reflux such as Barrett's, EAC, peptic stricture, erosive esophagitis, etc.  Given decreasing medication efficacy, and strong desire to decrease polypharmacy, she would like to proceed with evaluation for TIF as outlined  below  -Resume pantoprazole and Carafate currently prescribed -Resume antireflux lifestyle/dietary modifications -Plan for Esophageal Manometry to ensure no motility disorder.  I reviewed her recent upper GI series.  Feel that the subtle motility delay is more likely reflux burnout, but want to make sure that she has enough contractile reserve to overcome high pressure zone with TIF -Despite history of cervical spine fusion, has very good mobility -Pending results of Esophageal Manometry, think she would be an otherwise good candidate for TIF -Plan for hiatal hernia repair with TIF -Discussed postoperative dietary and exercise/activity limitations at length today -Current protocol is for overnight admission for observation.  Discussed that at length today along with sedation plan -Follow-up with me after Esophageal Manometry  I spent 50 minutes of time, including in depth chart review, independent review of results as outlined above, communicating results with the patient directly, face-to-face time with the patient, coordinating care, ordering studies and medications as appropriate, and documentation.           Lavena Bullion ,DO, FACG 03/06/2019, 2:04 PM

## 2019-03-14 ENCOUNTER — Other Ambulatory Visit: Payer: Self-pay | Admitting: Gastroenterology

## 2019-03-14 ENCOUNTER — Inpatient Hospital Stay (HOSPITAL_COMMUNITY): Admission: RE | Admit: 2019-03-14 | Payer: Medicare Other | Source: Ambulatory Visit

## 2019-03-14 DIAGNOSIS — Z1159 Encounter for screening for other viral diseases: Secondary | ICD-10-CM | POA: Diagnosis not present

## 2019-03-15 LAB — SARS CORONAVIRUS 2 (TAT 6-24 HRS): SARS Coronavirus 2: NEGATIVE

## 2019-03-16 ENCOUNTER — Ambulatory Visit: Payer: Medicare Other

## 2019-03-17 ENCOUNTER — Ambulatory Visit (HOSPITAL_COMMUNITY)
Admission: RE | Admit: 2019-03-17 | Discharge: 2019-03-17 | Disposition: A | Discharge status: Home / Self Care | Payer: Medicare Other | Attending: Gastroenterology | Admitting: Gastroenterology

## 2019-03-17 ENCOUNTER — Encounter (HOSPITAL_COMMUNITY)
Admission: RE | Disposition: A | Discharge status: Home / Self Care | Payer: Self-pay | Source: Home / Self Care | Attending: Gastroenterology

## 2019-03-17 DIAGNOSIS — Z79899 Other long term (current) drug therapy: Secondary | ICD-10-CM | POA: Insufficient documentation

## 2019-03-17 DIAGNOSIS — G500 Trigeminal neuralgia: Secondary | ICD-10-CM | POA: Insufficient documentation

## 2019-03-17 DIAGNOSIS — Z86010 Personal history of colonic polyps: Secondary | ICD-10-CM | POA: Insufficient documentation

## 2019-03-17 DIAGNOSIS — Z859 Personal history of malignant neoplasm, unspecified: Secondary | ICD-10-CM | POA: Insufficient documentation

## 2019-03-17 DIAGNOSIS — F419 Anxiety disorder, unspecified: Secondary | ICD-10-CM | POA: Diagnosis not present

## 2019-03-17 DIAGNOSIS — K449 Diaphragmatic hernia without obstruction or gangrene: Secondary | ICD-10-CM | POA: Diagnosis not present

## 2019-03-17 DIAGNOSIS — R1310 Dysphagia, unspecified: Secondary | ICD-10-CM

## 2019-03-17 DIAGNOSIS — M8580 Other specified disorders of bone density and structure, unspecified site: Secondary | ICD-10-CM | POA: Insufficient documentation

## 2019-03-17 DIAGNOSIS — Z794 Long term (current) use of insulin: Secondary | ICD-10-CM | POA: Diagnosis not present

## 2019-03-17 DIAGNOSIS — Z9049 Acquired absence of other specified parts of digestive tract: Secondary | ICD-10-CM | POA: Diagnosis not present

## 2019-03-17 DIAGNOSIS — E785 Hyperlipidemia, unspecified: Secondary | ICD-10-CM | POA: Diagnosis not present

## 2019-03-17 DIAGNOSIS — Z981 Arthrodesis status: Secondary | ICD-10-CM | POA: Insufficient documentation

## 2019-03-17 DIAGNOSIS — Z85820 Personal history of malignant melanoma of skin: Secondary | ICD-10-CM | POA: Insufficient documentation

## 2019-03-17 DIAGNOSIS — Z791 Long term (current) use of non-steroidal anti-inflammatories (NSAID): Secondary | ICD-10-CM | POA: Diagnosis not present

## 2019-03-17 DIAGNOSIS — E039 Hypothyroidism, unspecified: Secondary | ICD-10-CM | POA: Diagnosis not present

## 2019-03-17 DIAGNOSIS — E118 Type 2 diabetes mellitus with unspecified complications: Secondary | ICD-10-CM | POA: Diagnosis not present

## 2019-03-17 DIAGNOSIS — K317 Polyp of stomach and duodenum: Secondary | ICD-10-CM | POA: Insufficient documentation

## 2019-03-17 DIAGNOSIS — K219 Gastro-esophageal reflux disease without esophagitis: Secondary | ICD-10-CM

## 2019-03-17 HISTORY — PX: ESOPHAGEAL MANOMETRY: SHX5429

## 2019-03-17 SURGERY — MANOMETRY, ESOPHAGUS

## 2019-03-17 MED ORDER — LIDOCAINE VISCOUS HCL 2 % MT SOLN
OROMUCOSAL | Status: AC
  Filled 2019-03-17: qty 15

## 2019-03-17 SURGICAL SUPPLY — 2 items
FACESHIELD LNG OPTICON STERILE (SAFETY) IMPLANT
GLOVE BIO SURGEON STRL SZ8 (GLOVE) ×4 IMPLANT

## 2019-03-17 NOTE — Interval H&P Note (Signed)
History and Physical Interval Note:  03/17/2019 12:30 PM  [name] [name]  has presented today for surgery, with the diagnosis of GERD, hiatal hernia.  The various methods of treatment have been discussed with the patient and family. After consideration of risks, benefits and other options for treatment, the patient has consented to  Procedure(s): ESOPHAGEAL MANOMETRY (EM)  NO PH (N/A) as a surgical intervention.  The patient's history has been reviewed, patient examined, no change in status, stable for surgery.  I have reviewed the patient's chart and labs.  Questions were answered to the patient's satisfaction.     Dominic Pea Liddie Chichester

## 2019-03-17 NOTE — Progress Notes (Signed)
Esophageal manometry performed per protocol without complications.  Patient tolerated well. 

## 2019-03-19 ENCOUNTER — Encounter: Payer: Self-pay | Admitting: *Deleted

## 2019-03-22 DIAGNOSIS — M545 Low back pain: Secondary | ICD-10-CM | POA: Diagnosis not present

## 2019-03-22 DIAGNOSIS — M4326 Fusion of spine, lumbar region: Secondary | ICD-10-CM | POA: Diagnosis not present

## 2019-04-07 ENCOUNTER — Telehealth: Payer: Self-pay

## 2019-04-07 NOTE — Telephone Encounter (Signed)
Spoke to patient this morning to schedule an OV with MD to discuss scheduling TIF procedure. Patient states that her husband is having some medical issues that require immediate attention.   She will call back in the next few weeks to schedule an appointment.

## 2019-04-07 NOTE — Telephone Encounter (Signed)
-----   Message from Lavena Bullion, DO sent at 04/07/2019 10:01 AM EDT ----- Ok to schedule this patient for OV to discuss scheduling TIF for next available. EM study was normal (I sent her a MyChart message). Thanks.

## 2019-04-11 ENCOUNTER — Other Ambulatory Visit: Payer: Self-pay | Admitting: *Deleted

## 2019-04-11 MED ORDER — AMITRIPTYLINE HCL 25 MG PO TABS: 25.0000 mg | ORAL_TABLET | Freq: Every day | ORAL | 3 refills | Status: DC

## 2019-04-18 DIAGNOSIS — Z794 Long term (current) use of insulin: Secondary | ICD-10-CM | POA: Diagnosis not present

## 2019-04-18 DIAGNOSIS — G609 Hereditary and idiopathic neuropathy, unspecified: Secondary | ICD-10-CM | POA: Diagnosis not present

## 2019-04-18 DIAGNOSIS — E039 Hypothyroidism, unspecified: Secondary | ICD-10-CM | POA: Diagnosis not present

## 2019-04-18 DIAGNOSIS — E7800 Pure hypercholesterolemia, unspecified: Secondary | ICD-10-CM | POA: Diagnosis not present

## 2019-04-18 DIAGNOSIS — N189 Chronic kidney disease, unspecified: Secondary | ICD-10-CM | POA: Diagnosis not present

## 2019-04-18 DIAGNOSIS — E049 Nontoxic goiter, unspecified: Secondary | ICD-10-CM | POA: Diagnosis not present

## 2019-04-18 DIAGNOSIS — E1142 Type 2 diabetes mellitus with diabetic polyneuropathy: Secondary | ICD-10-CM | POA: Diagnosis not present

## 2019-04-18 LAB — HEMOGLOBIN A1C: Hemoglobin A1C: 7.3

## 2019-04-19 ENCOUNTER — Encounter: Payer: Self-pay | Admitting: Nurse Practitioner

## 2019-04-19 ENCOUNTER — Ambulatory Visit (INDEPENDENT_AMBULATORY_CARE_PROVIDER_SITE_OTHER): Payer: Medicare Other | Admitting: Nurse Practitioner

## 2019-04-19 VITALS — BP 114/70 | HR 82 | Temp 97.2°F | Ht 63.25 in | Wt 132.2 lb

## 2019-04-19 DIAGNOSIS — R1012 Left upper quadrant pain: Secondary | ICD-10-CM

## 2019-04-19 DIAGNOSIS — K5900 Constipation, unspecified: Secondary | ICD-10-CM | POA: Diagnosis not present

## 2019-04-19 NOTE — Patient Instructions (Signed)
If you are age 74 or older, your body mass index should be between 23-30. Your Body mass index is 23.24 kg/m. If this is out of the aforementioned range listed, please consider follow up with your Primary Care Provider.  If you are age 82 or younger, your body mass index should be between 19-25. Your Body mass index is 23.24 kg/m. If this is out of the aformentioned range listed, please consider follow up with your Primary Care Provider.   Increase colace 100 mg 2 capsule at bedtime every other night. If needed may take every night.  Continue with with Miralax in the morning  Linzess if no improvement  Follow up as needed.  Due to recent changes in healthcare laws, you may see the results of your imaging and laboratory studies on MyChart before your provider has had a chance to review them.  We understand that in some cases there may be results that are confusing or concerning to you. Not all laboratory results come back in the same time frame and the provider may be waiting for multiple results in order to interpret others.  Please give [place] 48 hours in order for your provider to thoroughly review all the results before contacting the office for clarification of your results. '

## 2019-04-19 NOTE — Progress Notes (Signed)
Agree with assessment and plan as outlined.  

## 2019-04-19 NOTE — Progress Notes (Signed)
04/19/2019 KIKI BONHAM [id number] 14-Apr-1945   Chief Complaint: Constipation, LUQ pain   History of Present Illness: Rickell Champlin is a 74 year old female with a past medical history of anxiety, DM II on insulin, cervical spondylosis, prior Lyme disease,  Hypothyroidism, osteopenia, trigeminal neuralgia, melanoma, HOH and GERD and diverticulitis. Past cholecystectomy and partial left sided colon resection secondary to diverticular disease in the 1990's. I saw her in the office on 02/15/2019 with complaints of LUQ pain. An abdominal/pelvic CT2/02/2019 showed a moderate amount of stool in the colon. She was advised to increase Miralax bid to increase stool output. Her LUQ pain improved. However, last week she felt constipated, she described feeling as if stool gets hung up to the LUQ area. She took her routine Miralax in the am, she added 4 prunes to her oatmeal and she took Colace 100mg  2 capsules at bed time. The next morning she passed a large amount of stool. She had a little residual LUQ tenderness but the prior LUQ mostly resolved. She presents today to review her bowel regimen. No current abdominal pain.   Abdominal/pelvic CT 02/21/2019:  1. No acute abdominal/pelvic findings, mass lesions or adenopathy. 2. Stable numerous small low-attenuation splenic lesions, likely benign hemangiomas. 3. Status post cholecystectomy. No biliary dilatation. 4. Moderate to large amount of stool throughout the colon and down into the rectum suggesting constipation. 5. Stable advanced atherosclerotic calcifications involving the aorta and branch vessels  EGD 07/19/2017: esophagus normal. Dilation was performed in the entire esophagus with a Savary dilator with mild resistance at 16 mm and 17 mm. Relook endoscopy showed an appropriate musocal wrent in the proximal esophagus / UES. A single 7 mm sessile polyp was found in the gastric body- removed via hot snare, benign.Other smaller (<86mm) benign  appearing gastric polyps noted but not removed. The exam of the stomach was otherwise normal. Biopsies negative for HP,duodenum was normal.  Colonoscopy 03/01/2015: There were two sessile polyps in the sigmoid colon roughly 14mm in size each, and both were removed with cold forceps. There was mild pancolonic diverticulosis noted. The colonic mucosa was otherwise normal without inflammatory changes. Biopsies were taken from the right and left colon to rule out microscopic colitis. The terminal ileum was normal. The surgical anastosmosis in the sigmoid colon was widely patent and appeared normal. Retroflexed views revealed internal hemorrhoids.Biopsies showed benign colonic mucosa.   Current Medications, Allergies, Past Medical History, Past Surgical History, Family History and Social History were reviewed in [employer] record.   Physical Exam: BP 114/70 (BP Location: Left Arm, Patient Position: Sitting, Cuff Size: Normal)   Pulse 82   Temp (!) 97.2 F (36.2 C)   Ht 5' 3.25" (1.607 m)   Wt 132 lb 4 oz (60 kg)   SpO2 97%   BMI 23.24 kg/m  General: Well developed  74 year old female in no acute distress. Head: Normocephalic and atraumatic. Eyes:  No scleral icterus. Conjunctiva pink . Ears: Normal auditory acuity. Lungs: Clear throughout to auscultation. Heart: Regular rate and rhythm, no murmur. Abdomen: Soft, nontender and nondistended. No masses or hepatomegaly. Normal bowel sounds x 4 quadrants. Midline abdominal scar intact.  Rectal: Deferred.  Musculoskeletal: Symmetrical with no gross deformities. Extremities: No edema. Neurological: Alert oriented x 4. No focal deficits.  Psychological:  Alert and cooperative. Normal mood and affect  Assessment and Recommendations:  1. Constipation -Continue Miralax in the am, add prunes to oatmeal as tolerated. -Colace 100mg   2 capsules at bed time every other night, if needed may take every night. -If no improvement  will try Linzess 42mcg daily -Follow up PRN  2. LUQ pain secondary to constipation. No current pain. CTAP 02/21/2019 showed a normal pancreas, moderate stool throughout the colon and rectum, remote surgical changes noted to the rectosigmoid colon (s/p left colon resection in the 1990's secondary to diverticular disease.

## 2019-04-24 ENCOUNTER — Other Ambulatory Visit: Payer: Self-pay | Admitting: General Practice

## 2019-04-24 MED ORDER — PANTOPRAZOLE SODIUM 40 MG PO TBEC: 40.0000 mg | DELAYED_RELEASE_TABLET | Freq: Every day | ORAL | 1 refills | Status: DC

## 2019-04-24 MED ORDER — ROSUVASTATIN CALCIUM 10 MG PO TABS: 10.0000 mg | ORAL_TABLET | Freq: Every day | ORAL | 1 refills | Status: DC

## 2019-05-01 DIAGNOSIS — Z85820 Personal history of malignant melanoma of skin: Secondary | ICD-10-CM | POA: Diagnosis not present

## 2019-05-01 DIAGNOSIS — D225 Melanocytic nevi of trunk: Secondary | ICD-10-CM | POA: Diagnosis not present

## 2019-05-01 DIAGNOSIS — D1801 Hemangioma of skin and subcutaneous tissue: Secondary | ICD-10-CM | POA: Diagnosis not present

## 2019-05-01 DIAGNOSIS — L564 Polymorphous light eruption: Secondary | ICD-10-CM | POA: Diagnosis not present

## 2019-05-01 DIAGNOSIS — L812 Freckles: Secondary | ICD-10-CM | POA: Diagnosis not present

## 2019-05-01 DIAGNOSIS — L820 Inflamed seborrheic keratosis: Secondary | ICD-10-CM | POA: Diagnosis not present

## 2019-05-10 ENCOUNTER — Ambulatory Visit
Admission: RE | Admit: 2019-05-10 | Discharge: 2019-05-10 | Disposition: A | Discharge status: Home / Self Care | Payer: Medicare Other | Source: Ambulatory Visit | Attending: Obstetrics and Gynecology | Admitting: Obstetrics and Gynecology

## 2019-05-10 ENCOUNTER — Other Ambulatory Visit: Payer: Self-pay

## 2019-05-10 DIAGNOSIS — Z1231 Encounter for screening mammogram for malignant neoplasm of breast: Secondary | ICD-10-CM

## 2019-05-19 ENCOUNTER — Other Ambulatory Visit: Payer: Self-pay | Admitting: Neurology

## 2019-06-20 ENCOUNTER — Other Ambulatory Visit: Payer: Self-pay | Admitting: General Practice

## 2019-06-20 DIAGNOSIS — R1012 Left upper quadrant pain: Secondary | ICD-10-CM

## 2019-06-20 MED ORDER — HYOSCYAMINE SULFATE 0.125 MG SL SUBL: SUBLINGUAL_TABLET | SUBLINGUAL | 1 refills | Status: DC

## 2019-06-23 ENCOUNTER — Telehealth: Payer: Self-pay | Admitting: Nurse Practitioner

## 2019-06-23 NOTE — Telephone Encounter (Signed)
Patient calling to schedule TIF 

## 2019-06-23 NOTE — Telephone Encounter (Signed)
Spoke to patient. She would like to have procedure done on August 11. I will set this up next week

## 2019-06-28 ENCOUNTER — Other Ambulatory Visit: Payer: Self-pay | Admitting: Gastroenterology

## 2019-06-28 DIAGNOSIS — K219 Gastro-esophageal reflux disease without esophagitis: Secondary | ICD-10-CM

## 2019-06-28 DIAGNOSIS — K449 Diaphragmatic hernia without obstruction or gangrene: Secondary | ICD-10-CM

## 2019-06-28 NOTE — Telephone Encounter (Signed)
Spoke to patient. Her TIF procedure has been scheduled for 08/30/19. All information has been sent through [facility] instructions mailed out. Patient will set up an appointment with her endocrinologist to discuss her post op diet as she take Levemir twice daily. 2 week post op appointment scheduled. All questions answered. Patient voiced understanding.

## 2019-06-29 ENCOUNTER — Other Ambulatory Visit: Payer: Self-pay | Admitting: General Practice

## 2019-06-29 MED ORDER — ALPRAZOLAM 0.5 MG PO TABS: ORAL_TABLET | ORAL | 3 refills | Status: DC

## 2019-06-29 NOTE — Telephone Encounter (Signed)
Last OV 02/03/19 Alprazolam last filled 03/03/19 #30 with 3

## 2019-07-04 ENCOUNTER — Telehealth: Payer: Self-pay | Admitting: Family Medicine

## 2019-07-04 NOTE — Telephone Encounter (Signed)
Spoke with patient she was driving and will call back

## 2019-07-05 NOTE — Telephone Encounter (Signed)
Please call pt to reschedule appt with the [facility].

## 2019-07-06 ENCOUNTER — Other Ambulatory Visit: Payer: Self-pay

## 2019-07-06 ENCOUNTER — Ambulatory Visit (INDEPENDENT_AMBULATORY_CARE_PROVIDER_SITE_OTHER): Payer: Medicare Other | Admitting: Family Medicine

## 2019-07-06 ENCOUNTER — Telehealth: Payer: Self-pay | Admitting: Nurse Practitioner

## 2019-07-06 ENCOUNTER — Encounter: Payer: Self-pay | Admitting: Family Medicine

## 2019-07-06 ENCOUNTER — Encounter: Payer: Self-pay | Admitting: General Practice

## 2019-07-06 VITALS — BP 131/68 | HR 72 | Temp 97.9°F | Resp 17 | Ht 64.0 in | Wt 127.0 lb

## 2019-07-06 DIAGNOSIS — R634 Abnormal weight loss: Secondary | ICD-10-CM

## 2019-07-06 DIAGNOSIS — K219 Gastro-esophageal reflux disease without esophagitis: Secondary | ICD-10-CM | POA: Diagnosis not present

## 2019-07-06 DIAGNOSIS — R109 Unspecified abdominal pain: Secondary | ICD-10-CM

## 2019-07-06 NOTE — Telephone Encounter (Signed)
Spoke with the patient. She does not feel she is mentally ready to do this yet and wants to give her body a chance to recover from the past year. Procedure, COVID screening and follow up appointment with Dr Bryan Lemma cancelled. Patient knows to call us if she needs [place] further.

## 2019-07-06 NOTE — Assessment & Plan Note (Signed)
Ongoing issue.  Pt is supposed to have TIF procedure upcoming but the idea of this is very stressful to her.  She has already lost weight in the last year and the post op instructions indicate that more weight loss is likely.  This causes her anxiety.  She doesn't feel she is in the right head space to do the procedure but isn't sure what to do.  Advised her that the TIF procedure is elective and not something that has to be done on a certain time table.  If she is not comfortable moving forward, she needs to tell GI and reschedule for when she feels she is ready.  Pt felt relieved to hear this.

## 2019-07-06 NOTE — Telephone Encounter (Signed)
She will be contacted in 6 months to schedule a follow up appointment with [place].

## 2019-07-06 NOTE — Telephone Encounter (Signed)
Beth, thank you for the update. Pls enter a 6 month recall letter.

## 2019-07-06 NOTE — Telephone Encounter (Signed)
Pt requested to cancel TIF procedure scheduled 08/30/19.  She stated that her "head is not in the right place to have the procedure and go through with the prep and diet."

## 2019-07-06 NOTE — Telephone Encounter (Signed)
Dr. Bryan Lemma, [facility] patient does not wish to proceed with TIF procedure at this time. What follow up do you recommend?

## 2019-07-06 NOTE — Progress Notes (Signed)
   Subjective:    Patient ID: [name] [name], female    DOB: 1945/11/14, 74 y.o.   MRN: [id number]  HPI Weight loss- pt is scheduled for TIF procedure for GERD.  Received post-op dietary instructions and she feels this will be 'a big challenge for me'.  She is concerned about this b/c she has lost 14 lbs in the last 14 months.  Pt is wondering if she should postpone procedure.  Abd pain- pt had CT in Feb 2021 that showed large amount of stool in colon indicative of constipation.  She was told to take Colace every other night and Miralax daily by GI.  Continues to have localized soreness on L side- 'it feels like a rock or a pinching or something'.  Worsens as the day goes on.  Pt is concerned about possible scar tissue.  Pt has hyoscyamine to use PRN.  Pain improves when she stands upright but worsens when she is sitting.  Review of Systems For ROS see HPI   This visit occurred during the SARS-CoV-2 public health emergency.  Safety protocols were in place, including screening questions prior to the visit, additional usage of staff PPE, and extensive cleaning of exam room while observing appropriate contact time as indicated for disinfecting solutions.       Objective:   Physical Exam Vitals reviewed.  Constitutional:      General: [name].    Appearance: [name]. She is not ill-appearing.  HENT:     Head: Normocephalic and atraumatic.  Eyes:     Extraocular Movements: Extraocular movements intact.     Pupils: Pupils are equal, round, and reactive to light.  Pulmonary:     Effort: Pulmonary effort is normal.  Abdominal:     General: Abdomen is flat. A surgical scar is present. Bowel sounds are normal. There is no distension.     Palpations: Abdomen is soft. There is no splenomegaly or mass.     Tenderness: There is abdominal tenderness in the left upper quadrant. There is no guarding or rebound.     Hernia: There is no hernia in the umbilical area or  ventral area.  Skin:    General: Skin is warm and dry.     Findings: No erythema or rash.  Neurological:     Mental Status: She is alert.  Psychiatric:        Mood and Affect: Mood is anxious.           Assessment & Plan:  L sided abd pain- ongoing issue for pt.  Reviewed recent CT no obvious abnormality.  Pt is wondering if scar tissue from previous procedures could be causing her pain.  This is definitely a possibility.  PE WNL today w/ exception of TTP over LUQ.  If pain persists or worsens, encouraged her to contact GI or if needed, we can refer to general surgery for their opinion.  Will follow.  Weight loss- pt has lost 14 lbs since last year.  She had quite a year with back surgery, COVID stress.  Encouraged her to eat regularly and make sure she was getting adequate protein.  Will follow.

## 2019-07-06 NOTE — Patient Instructions (Signed)
Follow up as needed or as scheduled It is perfectly OK to postpone the TIF procedure until you feel ready Make sure you are eating regularly to avoid additional weight loss and keep up your strength If the abdominal pain continues, please contact GI There is a possibility of scar tissue causing your pain and there is also the possibility that this pain is referred from your back Call with any questions or concerns Hang in there!

## 2019-07-07 NOTE — Telephone Encounter (Signed)
Understood. Can f/u in the GI clinic in 6 months for ongoing medical management of GERD.   Claiborne Billings, can you please cancel her [facility] case spot. Thanks.

## 2019-07-10 NOTE — Telephone Encounter (Signed)
[facility] TIF procedure cancelled for 08/30/19

## 2019-08-02 ENCOUNTER — Encounter: Payer: Self-pay | Admitting: Family Medicine

## 2019-08-02 ENCOUNTER — Other Ambulatory Visit: Payer: Self-pay

## 2019-08-02 ENCOUNTER — Ambulatory Visit (INDEPENDENT_AMBULATORY_CARE_PROVIDER_SITE_OTHER): Payer: Medicare Other | Admitting: Family Medicine

## 2019-08-02 ENCOUNTER — Ambulatory Visit: Payer: Medicare Other

## 2019-08-02 VITALS — BP 124/78 | HR 70 | Temp 97.9°F | Resp 16 | Ht 64.0 in | Wt 127.4 lb

## 2019-08-02 DIAGNOSIS — E1169 Type 2 diabetes mellitus with other specified complication: Secondary | ICD-10-CM | POA: Diagnosis not present

## 2019-08-02 DIAGNOSIS — M8589 Other specified disorders of bone density and structure, multiple sites: Secondary | ICD-10-CM | POA: Diagnosis not present

## 2019-08-02 DIAGNOSIS — M8580 Other specified disorders of bone density and structure, unspecified site: Secondary | ICD-10-CM

## 2019-08-02 DIAGNOSIS — R8290 Unspecified abnormal findings in urine: Secondary | ICD-10-CM

## 2019-08-02 DIAGNOSIS — E1142 Type 2 diabetes mellitus with diabetic polyneuropathy: Secondary | ICD-10-CM | POA: Diagnosis not present

## 2019-08-02 DIAGNOSIS — M859 Disorder of bone density and structure, unspecified: Secondary | ICD-10-CM

## 2019-08-02 DIAGNOSIS — R82998 Other abnormal findings in urine: Secondary | ICD-10-CM | POA: Diagnosis not present

## 2019-08-02 DIAGNOSIS — E785 Hyperlipidemia, unspecified: Secondary | ICD-10-CM | POA: Diagnosis not present

## 2019-08-02 LAB — BASIC METABOLIC PANEL
BUN: 24 mg/dL — ABNORMAL HIGH (ref 6–23)
CO2: 34 mEq/L — ABNORMAL HIGH (ref 19–32)
Calcium: 9.7 mg/dL (ref 8.4–10.5)
Chloride: 102 mEq/L (ref 96–112)
Creatinine, Ser: 0.87 mg/dL (ref 0.40–1.20)
GFR: 63.73 mL/min (ref >60.00)
Glucose, Bld: 155 mg/dL — ABNORMAL HIGH (ref 70–99)
Potassium: 4.6 mEq/L (ref 3.5–5.1)
Sodium: 143 mEq/L (ref 135–145)

## 2019-08-02 LAB — CBC WITH DIFFERENTIAL/PLATELET
Basophils Absolute: 0 10*3/uL (ref 0.0–0.1)
Basophils Relative: 0.6 % (ref 0.0–3.0)
Eosinophils Absolute: 0 10*3/uL (ref 0.0–0.7)
Eosinophils Relative: 0.8 % (ref 0.0–5.0)
HCT: 40.3 % (ref 36.0–46.0)
Hemoglobin: 13.4 g/dL (ref 12.0–15.0)
Lymphocytes Relative: 25.6 % (ref 12.0–46.0)
Lymphs Abs: 1.4 10*3/uL (ref 0.7–4.0)
MCHC: 33.2 g/dL (ref 30.0–36.0)
MCV: 91.6 fl (ref 78.0–100.0)
Monocytes Absolute: 0.5 10*3/uL (ref 0.1–1.0)
Monocytes Relative: 9.5 % (ref 3.0–12.0)
Neutro Abs: 3.5 10*3/uL (ref 1.4–7.7)
Neutrophils Relative %: 63.5 % (ref 43.0–77.0)
Platelets: 218 10*3/uL (ref 150.0–400.0)
RBC: 4.4 Mil/uL (ref 3.87–5.11)
RDW: 13.4 % (ref 11.5–15.5)
WBC: 5.6 10*3/uL (ref 4.0–10.5)

## 2019-08-02 LAB — POCT URINALYSIS DIPSTICK
Bilirubin, UA: NEGATIVE
Blood, UA: NEGATIVE
Glucose, UA: NEGATIVE
Ketones, UA: NEGATIVE
Nitrite, UA: NEGATIVE
Protein, UA: NEGATIVE
Spec Grav, UA: 1.01 (ref 1.010–1.025)
Urobilinogen, UA: 0.2 E.U./dL
pH, UA: 6 (ref 5.0–8.0)

## 2019-08-02 LAB — HEPATIC FUNCTION PANEL
ALT: 13 U/L (ref 0–35)
AST: 17 U/L (ref 0–37)
Albumin: 4.4 g/dL (ref 3.5–5.2)
Alkaline Phosphatase: 88 U/L (ref 39–117)
Bilirubin, Direct: 0.1 mg/dL (ref 0.0–0.3)
Total Bilirubin: 0.5 mg/dL (ref 0.2–1.2)
Total Protein: 6.8 g/dL (ref 6.0–8.3)

## 2019-08-02 LAB — LIPID PANEL
Cholesterol: 181 mg/dL (ref 0–200)
HDL: 71.3 mg/dL (ref >39.00)
LDL Cholesterol: 95 mg/dL (ref 0–99)
NonHDL: 109.78
Total CHOL/HDL Ratio: 3
Triglycerides: 76 mg/dL (ref 0.0–149.0)
VLDL: 15.2 mg/dL (ref 0.0–40.0)

## 2019-08-02 LAB — MICROALBUMIN / CREATININE URINE RATIO
Creatinine,U: 55.4 mg/dL
Microalb Creat Ratio: 1.3 mg/g (ref 0.0–30.0)
Microalb, Ur: <0.7 mg/dL (ref 0.0–1.9)

## 2019-08-02 LAB — VITAMIN D 25 HYDROXY (VIT D DEFICIENCY, FRACTURES): VITD: 61.18 ng/mL (ref 30.00–100.00)

## 2019-08-02 NOTE — Assessment & Plan Note (Signed)
Repeat Vit D level and DEXA ordered

## 2019-08-02 NOTE — Patient Instructions (Addendum)
Schedule your Medicare Wellness Visit at your convenience Follow up with me in 6 months to recheck cholesterol We'll notify you of your lab results and make any changes if needed Keep up the good work on healthy diet and regular exercise- you look great! We'll call you with your bone density appt Call with any questions or concerns Have a great summer!!!

## 2019-08-02 NOTE — Progress Notes (Signed)
   Subjective:    Patient ID: [name] [name], female    DOB: [birth year], 74 y.o.   MRN: [id number]  HPI  DM- chronic problem.  Following w/ Dr Chalmers Cater.  Due for microalbumin and foot exam.  UTD on eye exam.  No numbness/tingling of hands/feet.  No sores on feet.  Hyperlipidemia- chronic problem, on Crestor 10mg  daily.  No CP, SOB, abd pain above baseline, N/V.  Pt does Wii Fit at home.  Osteopenia- due for repeat DEXA this summer and Vit D level  Urine odor- sxs started 'the last couple of days'.  Denies dysuria- but this is not a typical symptom for her.  Urine is darker and more cloudy.   Review of Systems For ROS see HPI   This visit occurred during the SARS-CoV-2 public health emergency.  Safety protocols were in place, including screening questions prior to the visit, additional usage of staff PPE, and extensive cleaning of exam room while observing appropriate contact time as indicated for disinfecting solutions.       Objective:   Physical Exam Vitals reviewed.  Constitutional:      General: [name].    Appearance: [name].  HENT:     Head: Normocephalic and atraumatic.  Eyes:     Conjunctiva/sclera: Conjunctivae normal.     Pupils: Pupils are equal, round, and reactive to light.  Neck:     Thyroid: No thyromegaly.  Cardiovascular:     Rate and Rhythm: Normal rate and regular rhythm.     Heart sounds: Normal heart sounds. No murmur heard.   Pulmonary:     Effort: Pulmonary effort is normal. No respiratory distress.     Breath sounds: Normal breath sounds.  Abdominal:     General: There is no distension.     Palpations: Abdomen is soft.     Tenderness: There is no abdominal tenderness.  Musculoskeletal:     Cervical back: Normal range of motion and neck supple.  Lymphadenopathy:     Cervical: No cervical adenopathy.  Skin:    General: Skin is warm and dry.  Neurological:     Mental Status: She is alert and oriented to person,  place, and time.  Psychiatric:        Behavior: Behavior normal.           Assessment & Plan:  Abnormal urine odor- pt has hx of UTI when this occurs.  + leuks in urine.  Send for culture and await results prior to abx.  Pt expressed understanding and is in agreement w/ plan.

## 2019-08-02 NOTE — Assessment & Plan Note (Signed)
Chronic problem.  Tolerating statin w/o difficulty.  Check labs.  Adjust meds prn  

## 2019-08-02 NOTE — Assessment & Plan Note (Signed)
Foot exam done today.  Microalbumin ordered.

## 2019-08-03 ENCOUNTER — Encounter: Payer: Self-pay | Admitting: General Practice

## 2019-08-04 ENCOUNTER — Other Ambulatory Visit: Payer: Self-pay | Admitting: General Practice

## 2019-08-04 LAB — URINE CULTURE
MICRO NUMBER:: 10704396
SPECIMEN QUALITY:: ADEQUATE

## 2019-08-04 MED ORDER — SULFAMETHOXAZOLE-TRIMETHOPRIM 800-160 MG PO TABS
1.0000 | ORAL_TABLET | Freq: Two times a day (BID) | ORAL | 0 refills | Status: DC
Start: 2019-08-04 — End: 2019-09-18

## 2019-08-21 ENCOUNTER — Ambulatory Visit (INDEPENDENT_AMBULATORY_CARE_PROVIDER_SITE_OTHER)
Admission: RE | Admit: 2019-08-21 | Discharge: 2019-08-21 | Disposition: A | Discharge status: Home / Self Care | Payer: Medicare Other | Source: Ambulatory Visit | Attending: Family Medicine | Admitting: Family Medicine

## 2019-08-21 ENCOUNTER — Other Ambulatory Visit: Payer: Self-pay

## 2019-08-21 DIAGNOSIS — M859 Disorder of bone density and structure, unspecified: Secondary | ICD-10-CM

## 2019-08-21 DIAGNOSIS — M8580 Other specified disorders of bone density and structure, unspecified site: Secondary | ICD-10-CM | POA: Diagnosis not present

## 2019-08-21 DIAGNOSIS — M8589 Other specified disorders of bone density and structure, multiple sites: Secondary | ICD-10-CM | POA: Diagnosis not present

## 2019-08-28 ENCOUNTER — Other Ambulatory Visit (HOSPITAL_COMMUNITY): Payer: Medicare Other

## 2019-08-28 DIAGNOSIS — M5106 Intervertebral disc disorders with myelopathy, lumbar region: Secondary | ICD-10-CM | POA: Diagnosis not present

## 2019-08-28 DIAGNOSIS — M4326 Fusion of spine, lumbar region: Secondary | ICD-10-CM | POA: Diagnosis not present

## 2019-08-28 DIAGNOSIS — M545 Low back pain: Secondary | ICD-10-CM | POA: Diagnosis not present

## 2019-08-29 ENCOUNTER — Encounter: Payer: Self-pay | Admitting: Family Medicine

## 2019-08-30 ENCOUNTER — Encounter (HOSPITAL_COMMUNITY): Payer: Self-pay

## 2019-08-30 ENCOUNTER — Ambulatory Visit (HOSPITAL_COMMUNITY): Admit: 2019-08-30 | Payer: Medicare Other | Admitting: Gastroenterology

## 2019-08-30 SURGERY — ESOPHAGOGASTRODUODENOSCOPY (EGD) WITH PROPOFOL
Anesthesia: General

## 2019-08-31 ENCOUNTER — Telehealth: Payer: Self-pay | Admitting: Family Medicine

## 2019-08-31 NOTE — Telephone Encounter (Signed)
Attempted to schedule AWV. Unable to LVM.  Will try at later time.  

## 2019-08-31 NOTE — Telephone Encounter (Signed)
Spoke with patient to scheduled AWV she was driving and will call back today

## 2019-09-12 ENCOUNTER — Ambulatory Visit: Payer: Medicare Other | Admitting: Gastroenterology

## 2019-09-18 ENCOUNTER — Ambulatory Visit (INDEPENDENT_AMBULATORY_CARE_PROVIDER_SITE_OTHER): Payer: Medicare Other

## 2019-09-18 ENCOUNTER — Other Ambulatory Visit: Payer: Self-pay

## 2019-09-18 VITALS — BP 118/60 | HR 78 | Temp 98.5°F | Resp 16 | Ht 64.0 in | Wt 126.4 lb

## 2019-09-18 DIAGNOSIS — Z0000 Encounter for general adult medical examination without abnormal findings: Secondary | ICD-10-CM

## 2019-09-18 NOTE — Progress Notes (Signed)
Subjective:   [name] [name] is a 74 y.o. female who presents for Medicare Annual (Subsequent) preventive examination.  Review of Systems:   Cardiac Risk Factors include: advanced age (>62men, >21 women);diabetes mellitus;dyslipidemia     Objective:     Vitals: BP 118/60 (BP Location: Right Arm, Patient Position: Sitting, Cuff Size: Normal)   Pulse 78   Temp 98.5 F (36.9 C) (Oral)   Resp 16   Ht 5\' 4"  (1.626 m)   Wt 126 lb 6.4 oz (57.3 kg)   SpO2 97%   BMI 21.70 kg/m   Body mass index is 21.7 kg/m.  Advanced Directives 09/18/2019 02/10/2019 10/19/2017 07/19/2017 10/14/2016 09/01/2016 10/16/2015  Does Patient Have a Medical Advance Directive? Yes No Yes Yes Yes No Yes  Type of Paramedic of [place];Living will - [place];Living will - Living will;Healthcare Power of [place];Living will  Copy of [place] in Chart? No - copy requested - Yes - No - copy requested - -  Would patient like information on creating a medical advance directive? - - - - - No - Patient declined -    Tobacco Social History   Tobacco Use  Smoking Status Former Smoker  . Start date: 01/20/1976  . Quit date: 32  . Years since quitting: 40.6  Smokeless Tobacco Never Used     Counseling given: Not Answered   Clinical Intake:  Pre-visit preparation completed: Yes  Pain : No/denies pain     Nutritional Status: BMI of 19-24  Normal Nutritional Risks: Unintentional weight loss (weight loss-Patient has discussed with Dr. Birdie Riddle already) Diabetes: Yes CBG done?: No Did pt. bring in CBG monitor from home?: No  How often do you need to have someone help you when you read instructions, pamphlets, or other written materials from your doctor or pharmacy?: 1 - Never What is the last grade level you completed in school?: 12th grade  Nutrition Risk Assessment:  Has the patient had any N/V/D within the last 2  months?  Yes  Does the patient have any non-healing wounds?  No  Has the patient had any unintentional weight loss or weight gain?  Yes 12 pounds-states she has already discussed with PCP.  Diabetes:  Is the patient diabetic?  Yes  If diabetic, was a CBG obtained today?  No  Did the patient bring in their glucometer from home?  No  How often do you monitor your CBG's? Twice daily.   Financial Strains and Diabetes Management:  Are you having any financial strains with the device, your supplies or your medication? No .  Does the patient want to be seen by Chronic Care Management for management of their diabetes?  No  Would the patient like to be referred to a Nutritionist or for Diabetic Management?  No   Diabetic Exams:  Diabetic Eye Exam: Completed 01/30/2019.   Diabetic Foot Exam: Completed 08/02/2019.    Interpreter Needed?: No  Information entered by :: Caroleen Hamman LPN  Past Medical History:  Diagnosis Date  . Abdominal pain    EGD and CTs neg, rx Amitriptiline (non ulcer dyspepsia) EGD 12/11.. s/p ballon dilitation  . Anxiety   . Cataract   . Cervical spondylosis without myelopathy 11/30/2012  . Chronic conjunctivitis   . Diabetes mellitus    no meds, diet controlled  . Diverticulosis of colon   . GERD with stricture    s/p dilatations  . History  of Lyme disease   . HOH (hard of hearing)    bilateral hearing aids  . Hyperlipidemia   . Hypothyroidism     Dx 10-9 TSH 5.2 , then TSH normalized, eventually went up again 2012  . Melanoma ([place])    h/o  . Memory difficulties 06/05/2014  . Osteopenia   . Solar lentigo    face; s/p excision 3/09. See Derm q 6 months  . Trigeminal neuralgia    h/o    Past Surgical History:  Procedure Laterality Date  . ABDOMINAL HYSTERECTOMY    . ANKLE SURGERY  07/01/2015   R andkle tendon surgery  . BREAST BIOPSY Left 1988  . BREAST EXCISIONAL BIOPSY Left 1988  . CERVICAL FUSION  06/2017  . CHOLECYSTECTOMY    . COLON  RESECTION  1999   d/t diverticulitis  . COLON SURGERY    . COLONOSCOPY    . ESOPHAGEAL MANOMETRY N/A 03/17/2019   Procedure: ESOPHAGEAL MANOMETRY (EM)  NO PH;  Surgeon: Lavena Bullion, DO;  Location: [facility];  Service: Gastroenterology;  Laterality: N/A;  . LUMBAR [place] SURGERY  07/25/18, 09/20  . melanoma resection Right     Right Arm Surgery, R facial surgery (05-2012)   . PTX s/t chest tube    . SHOULDER SURGERY     right  . UPPER GASTROINTESTINAL ENDOSCOPY    . WRIST SURGERY     removed melaoma, right   Family History  Problem Relation Age of Onset  . Prostate cancer Father   . Endometrial cancer Sister        clear cell carcinoma  . Stroke Sister   . Heart attack Paternal Grandfather        GF in his 25s  . Melanoma Maternal Uncle   . Colon cancer Neg Hx   . Breast cancer Neg Hx   . Diabetes Neg Hx   . Stomach cancer Neg Hx   . Rectal cancer Neg Hx   . Esophageal cancer Neg Hx    Social History   Socioeconomic History  . Marital status: Married    Spouse name: Percell Miller  . Number of children: 2  . Years of education: college  . Highest education level: Not on file  Occupational History  . Occupation: retired    Fish farm manager: RETIRED  Tobacco Use  . Smoking status: Former Smoker    Start date: 01/20/1976    Quit date: 1981    Years since quitting: 40.6  . Smokeless tobacco: Never Used  Vaping Use  . Vaping Use: Never used  Substance and Sexual Activity  . Alcohol use: Yes    Alcohol/week: 2.0 - 3.0 standard drinks    Types: 2 - 3 Glasses of wine per week  . Drug use: No  . Sexual activity: Not on file  Other Topics Concern  . Not on file  Social History Narrative   Patient is married with 2 children.   Patient is right handed.   Patient has college education.   Patient does not drink caffeine.          Social Determinants of Health   Financial Resource Strain: Low Risk   . Difficulty of Paying Living Expenses: Not hard at all  Food Insecurity:  No Food Insecurity  . Worried About Charity fundraiser in the Last Year: Never true  . Ran Out of Food in the Last Year: Never true  Transportation Needs: No Transportation Needs  . Lack of Transportation (Medical):  No  . Lack of Transportation (Non-Medical): No  Physical Activity: Sufficiently Active  . Days of Exercise per Week: 3 days  . Minutes of Exercise per Session: 50 min  Stress: No Stress Concern Present  . Feeling of Stress : Only a little  Social Connections: Socially Integrated  . Frequency of Communication with Friends and Family: More than three times a week  . Frequency of Social Gatherings with Friends and Family: More than three times a week  . Attends Religious Services: More than 4 times per year  . Active Member of Clubs or Organizations: Yes  . Attends Archivist Meetings: More than 4 times per year  . Marital Status: Married    Outpatient Encounter Medications as of 09/18/2019  Medication Sig  . ALPRAZolam (XANAX) 0.5 MG tablet TAKE 1 TABLET(0.5 MG) BY MOUTH AT BEDTIME AS NEEDED FOR SLEEP  . amitriptyline (ELAVIL) 25 MG tablet Take 1 tablet (25 mg total) by mouth at bedtime.  . baclofen (LIORESAL) 10 MG tablet Take 10 mg by mouth 3 (three) times daily.  . BD PEN NEEDLE NANO 2ND GEN 32G X 4 MM MISC daily. as directed  . Cholecalciferol (VITAMIN D3) 2000 UNITS TABS Take 1 tablet by mouth daily.   . diclofenac Sodium (VOLTAREN) 1 % GEL Apply topically.  . gabapentin (NEURONTIN) 300 MG capsule TAKE ONE CAPSULE BY MOUTH EVERY MORNING,AROUND 2 PM AND 2 CAPSULES AT BEDTIME  . hyoscyamine (LEVSIN SL) 0.125 MG SL tablet DISSOLVE 1 TABLET(0.125 MG) UNDER THE TONGUE EVERY 6 HOURS AS NEEDED FOR CRAMPING  . Lancets (ONETOUCH DELICA PLUS [id number]) MISC 2 (two) times daily. as directed  . LEVEMIR FLEXTOUCH 100 UNIT/ML Pen Inject 6 Units into the skin at bedtime.   [name] levothyroxine (SYNTHROID, LEVOTHROID) 50 MCG tablet TK 1 T PO QD  . Lifitegrast (XIIDRA) 5 % SOLN  Apply to eye.  . Melatonin 5 MG TABS Take 1 tablet by mouth at bedtime.  . Multiple Vitamin (MULTIVITAMIN) tablet Take 1 tablet by mouth daily.    Glory Rosebush VERIO test strip 2 (two) times daily. for testing  . pantoprazole (PROTONIX) 40 MG tablet Take 1 tablet (40 mg total) by mouth daily.  . Probiotic Product (PROBIOTIC PO) Take 1 tablet by mouth daily.  . rosuvastatin (CRESTOR) 10 MG tablet Take 1 tablet (10 mg total) by mouth daily.  . famotidine (PEPCID) 20 MG tablet TAKE 1 TABLET(20 MG) BY MOUTH AT BEDTIME (Patient not taking: Reported on 09/18/2019)  . [DISCONTINUED] sulfamethoxazole-trimethoprim (BACTRIM DS) 800-160 MG tablet Take 1 tablet by mouth 2 (two) times daily.   No facility-administered encounter medications on file as of 09/18/2019.    Activities of Daily Living In your present state of health, do you have any difficulty performing the following activities: 09/18/2019 08/02/2019  Hearing? Y N  Comment hearing aids -  Vision? N N  Difficulty concentrating or making decisions? N N  Walking or climbing stairs? N N  Dressing or bathing? N N  Doing errands, shopping? N N  Preparing Food and eating ? N -  Using the Toilet? N -  In the past six months, have you accidently leaked urine? N -  Do you have problems with loss of bowel control? N -  Managing your Medications? N -  Managing your Finances? N -  Housekeeping or managing your Housekeeping? N -  Some recent data might be hidden    Patient Care Team: Midge Minium, MD as PCP - General (  Family Medicine) Zehr, Laban Emperor, PA-C as Physician Assistant (Gastroenterology) Jacelyn Pi, MD as Consulting Physician (Endocrinology) Kathrynn Ducking, MD as Consulting Physician (Neurology) Georgeann Oppenheim, MD as Consulting Physician (Ophthalmology) Rosemary Holms, DPM as Consulting Physician (Podiatry) Dian Queen, MD as Consulting Physician (Obstetrics and Gynecology) Jarome Matin, MD as Consulting Physician  (Dermatology) Armbruster, Carlota Raspberry, MD as Consulting Physician (Gastroenterology)    Assessment:   This is a routine wellness examination for [place].  Exercise Activities and Dietary recommendations Current Exercise Habits: Home exercise routine, Type of exercise: walking;calisthenics, Time (Minutes): 45, Frequency (Times/Week): 3, Weekly Exercise (Minutes/Week): 135, Intensity: Mild, Exercise limited by: None identified  Goals Addressed            This Visit's Progress   . Patient Stated       Continue current level of activity       Fall Risk: Fall Risk  09/18/2019 08/02/2019 07/06/2019 05/06/2018 03/14/2018  Falls in the past year? 0 0 0 0 0  Number falls in past yr: 0 0 0 - 0  Injury with Fall? 0 0 0 - 0  Risk for fall due to : - - - - -  Follow up Falls prevention discussed Falls evaluation completed Falls evaluation completed Falls evaluation completed -    FALL RISK PREVENTION PERTAINING TO THE HOME:  Any stairs in or around the home? Yes  If so, are there any without handrails? No   Home free of loose throw rugs in walkways, pet beds, electrical cords, etc? Yes  Adequate lighting in your home to reduce risk of falls? Yes   ASSISTIVE DEVICES UTILIZED TO PREVENT FALLS:  Life alert? No  Use of a cane, walker or w/c? No  Grab bars in the bathroom? Yes  Shower chair or bench in shower? Yes  Elevated toilet seat or a handicapped toilet? No   TIMED UP AND GO:  Was the test performed? Yes .  Length of time to ambulate 10 feet: 10 sec.   GAIT:  Appearance of gait: Gait steady and fast without the use of an assistive device. Education: Fall risk prevention has been discussed.  Intervention(s) required? No   DME/home health order needed?  No    Depression Screen PHQ 2/9 Scores 09/18/2019 08/02/2019 07/06/2019 03/14/2018  PHQ - 2 Score 0 0 0 0  PHQ- 9 Score - 0 0 -  Exception Documentation - - - -     Cognitive Function: No cognitive impairment noted. Patient does  crossword puzzles, reads & plays games on her computer for brain health. MMSE - Mini Mental State Exam 10/19/2017  Orientation to time 5  Orientation to Place 5  Registration 3  Attention/ Calculation 5  Recall 3  Language- name 2 objects 2  Language- repeat 1  Language- follow 3 step command 3  Language- read & follow direction 1  Write a sentence 1  Copy design 1  Total score 30        Immunization History  Administered Date(s) Administered  . Influenza Whole 10/26/2011, 09/19/2012  . Influenza, High Dose Seasonal PF 01/23/2015, 10/14/2016, 10/19/2017  . Influenza,inj,Quad PF,6+ Mos 01/15/2014, 10/03/2015  . PFIZER SARS-COV-2 Vaccination 02/22/2019, 03/30/2019  . Pneumococcal Conjugate-13 04/06/2014  . Pneumococcal Polysaccharide-23 03/18/2011  . Td 12/21/2003  . Tdap 04/06/2014  . Zoster 11/03/2011  . Zoster Recombinat (Shingrix) 11/19/2016, 01/20/2017    Qualifies for Shingles Vaccine? Completed vaccines  Tdap: Up to Date  Flu Vaccine: Due 09/2019  Pneumococcal Vaccine: Up  to Date  Covid-19 Vaccine:  Completed vaccines  Screening Tests Health Maintenance  Topic Date Due  . INFLUENZA VACCINE  08/20/2019  . Hepatitis C Screening  07/05/2020 (Originally [birth year])  . HEMOGLOBIN A1C  10/19/2019  . OPHTHALMOLOGY EXAM  01/30/2020  . FOOT EXAM  08/01/2020  . URINE MICROALBUMIN  08/01/2020  . MAMMOGRAM  05/09/2021  . DEXA SCAN  08/20/2021  . TETANUS/TDAP  04/05/2024  . COLONOSCOPY  02/28/2025  . COVID-19 Vaccine  Completed  . PNA vac Low Risk Adult  Completed    Cancer Screenings:  Colorectal Screening: Completed 03/01/2015. Repeat every 10 years.  Mammogram: Completed 05/10/2019. Repeat every year.  Bone Density: Completed 08/21/2019. Results reflect  OSTEOPOROSIS. Repeat every 2 years.   Lung Cancer Screening: (Low Dose CT Chest recommended if Age 58-80 years, 30 pack-year currently smoking OR have quit w/in 15years.) does not qualify.   Additional  Screening:  Hepatitis C Screening: does qualify; Discuss with PCP  Dental Screening: Recommended annual dental exams for proper oral hygiene   Community Resource Referral:  CRR required this visit?  No       Plan:  I have personally reviewed and addressed the Medicare Annual Wellness questionnaire and have noted the following in the patient's chart:  A. Medical and social history B. Use of alcohol, tobacco or illicit drugs  C. Current medications and supplements D. Functional ability and status E.  Nutritional status F.  Physical activity G. Advance directives H. List of other physicians I.  Hospitalizations, surgeries, and ER visits in previous 12 months J.  [place] such as hearing and vision if needed, cognitive and depression L. Referrals and appointments   In addition, I have reviewed and discussed with patient certain preventive protocols, quality metrics, and best practice recommendations. A written personalized care plan for preventive services as well as general preventive health recommendations were provided to patient.  Patient to access AVS via mychart.   Signed,    Marta Antu, LPN  01/21/7251 Nurse Health Advisor   Nurse Notes: None

## 2019-09-18 NOTE — Patient Instructions (Signed)
[name] [name] , Thank you for taking time to come for your Medicare Wellness Visit. I appreciate your ongoing commitment to your health goals. Please review the following plan we discussed and let me know if I can assist you in the future.   Screening recommendations/referrals: Colonoscopy: Completed 03/01/2015-Due 02/28/2025 Mammogram: Completed 05/10/2019-Due 05/09/2020 Bone Density: Completed 08/21/2019- Due 08/20/2021 Recommended yearly ophthalmology/optometry visit for glaucoma screening and checkup Recommended yearly dental visit for hygiene and checkup  Vaccinations: Influenza vaccine: Due 09/2019 Pneumococcal vaccine: Completed vaccines Tdap vaccine: Up to Date- Due-04/05/2024 Shingles vaccine: Completed vaccines  Covid-19:Completed vaccines  Advanced directives: Please bring a copy for your chart.  Conditions/risks identified: See problem list  Next appointment: Follow up in one year for your annual wellness visit 09/30/2020 @ 11:15am.   Preventive Care 74 Years and Older, Female Preventive care refers to lifestyle choices and visits with your health care provider that can promote health and wellness. What does preventive care include?  A yearly physical exam. This is also called an annual well check.  Dental exams once or twice a year.  Routine eye exams. Ask your health care provider how often you should have your eyes checked.  Personal lifestyle choices, including:  Daily care of your teeth and gums.  Regular physical activity.  Eating a healthy diet.  Avoiding tobacco and drug use.  Limiting alcohol use.  Practicing safe sex.  Taking low-dose aspirin every day.  Taking vitamin and mineral supplements as recommended by your health care provider. What happens during an annual well check? The services and screenings done by your health care provider during your annual well check will depend on your age, overall health, lifestyle risk factors, and family history of  disease. Counseling  Your health care provider may ask you questions about your:  Alcohol use.  Tobacco use.  Drug use.  Emotional well-being.  Home and relationship well-being.  Sexual activity.  Eating habits.  History of falls.  Memory and ability to understand (cognition).  Work and work Statistician.  Reproductive health. Screening  You may have the following tests or measurements:  Height, weight, and BMI.  Blood pressure.  Lipid and cholesterol levels. These may be checked every 5 years, or more frequently if you are over 74 years old.  Skin check.  Lung cancer screening. You may have this screening every year starting at age 74 if you have a 30-pack-year history of smoking and currently smoke or have quit within the past 15 years.  Fecal occult blood test (FOBT) of the stool. You may have this test every year starting at age 74.  Flexible sigmoidoscopy or colonoscopy. You may have a sigmoidoscopy every 5 years or a colonoscopy every 10 years starting at age 74.  Hepatitis C blood test.  Hepatitis B blood test.  Sexually transmitted disease (STD) testing.  Diabetes screening. This is done by checking your blood sugar (glucose) after you have not eaten for a while (fasting). You may have this done every 1-3 years.  Bone density scan. This is done to screen for osteoporosis. You may have this done starting at age 74.  Mammogram. This may be done every 1-2 years. Talk to your health care provider about how often you should have regular mammograms. Talk with your health care provider about your test results, treatment options, and if necessary, the need for more tests. Vaccines  Your health care provider may recommend certain vaccines, such as:  Influenza vaccine. This is recommended every year.  Tetanus, diphtheria, and acellular pertussis (Tdap, Td) vaccine. You may need a Td booster every 10 years.  Zoster vaccine. You may need this after age  4.  Pneumococcal 13-valent conjugate (PCV13) vaccine. One dose is recommended after age 74.  Pneumococcal polysaccharide (PPSV23) vaccine. One dose is recommended after age 74. Talk to your health care provider about which screenings and vaccines you need and how often you need them. This information is not intended to replace advice given to you by your health care provider. Make sure you discuss any questions you have with your health care provider. Document Released: 02/01/2015 Document Revised: 09/25/2015 Document Reviewed: 11/06/2014 Elsevier Interactive Patient Education  2017 [employer] Prevention in the Home Falls can cause injuries. They can happen to people of all ages. There are many things you can do to make your home safe and to help prevent falls. What can I do on the outside of my home?  Regularly fix the edges of walkways and driveways and fix any cracks.  Remove anything that might make you trip as you walk through a door, such as a raised step or threshold.  Trim any bushes or trees on the path to your home.  Use bright outdoor lighting.  Clear any walking paths of anything that might make someone trip, such as rocks or tools.  Regularly check to see if handrails are loose or broken. Make sure that both sides of any steps have handrails.  Any raised decks and porches should have guardrails on the edges.  Have any leaves, snow, or ice cleared regularly.  Use sand or salt on walking paths during winter.  Clean up any spills in your garage right away. This includes oil or grease spills. What can I do in the bathroom?  Use night lights.  Install grab bars by the toilet and in the tub and shower. Do not use towel bars as grab bars.  Use non-skid mats or decals in the tub or shower.  If you need to sit down in the shower, use a plastic, non-slip stool.  Keep the floor dry. Clean up any water that spills on the floor as soon as it happens.  Remove  soap buildup in the tub or shower regularly.  Attach bath mats securely with double-sided non-slip rug tape.  Do not have throw rugs and other things on the floor that can make you trip. What can I do in the bedroom?  Use night lights.  Make sure that you have a light by your bed that is easy to reach.  Do not use any sheets or blankets that are too big for your bed. They should not hang down onto the floor.  Have a firm chair that has side arms. You can use this for support while you get dressed.  Do not have throw rugs and other things on the floor that can make you trip. What can I do in the kitchen?  Clean up any spills right away.  Avoid walking on wet floors.  Keep items that you use a lot in easy-to-reach places.  If you need to reach something above you, use a strong step stool that has a grab bar.  Keep electrical cords out of the way.  Do not use floor polish or wax that makes floors slippery. If you must use wax, use non-skid floor wax.  Do not have throw rugs and other things on the floor that can make you trip. What can I do  with my stairs?  Do not leave any items on the stairs.  Make sure that there are handrails on both sides of the stairs and use them. Fix handrails that are broken or loose. Make sure that handrails are as long as the stairways.  Check any carpeting to make sure that it is firmly attached to the stairs. Fix any carpet that is loose or worn.  Avoid having throw rugs at the top or bottom of the stairs. If you do have throw rugs, attach them to the floor with carpet tape.  Make sure that you have a light switch at the top of the stairs and the bottom of the stairs. If you do not have them, ask someone to add them for you. What else can I do to help prevent falls?  Wear shoes that:  Do not have high heels.  Have rubber bottoms.  Are comfortable and fit you well.  Are closed at the toe. Do not wear sandals.  If you use a  stepladder:  Make sure that it is fully opened. Do not climb a closed stepladder.  Make sure that both sides of the stepladder are locked into place.  Ask someone to hold it for you, if possible.  Clearly mark and make sure that you can see:  Any grab bars or handrails.  First and last steps.  Where the edge of each step is.  Use tools that help you move around (mobility aids) if they are needed. These include:  Canes.  Walkers.  Scooters.  Crutches.  Turn on the lights when you go into a dark area. Replace any light bulbs as soon as they burn out.  Set up your furniture so you have a clear path. Avoid moving your furniture around.  If any of your floors are uneven, fix them.  If there are any pets around you, be aware of where they are.  Review your medicines with your doctor. Some medicines can make you feel dizzy. This can increase your chance of falling. Ask your doctor what other things that you can do to help prevent falls. This information is not intended to replace advice given to you by your health care provider. Make sure you discuss any questions you have with your health care provider. Document Released: 11/01/2008 Document Revised: 06/13/2015 Document Reviewed: 02/09/2014 Elsevier Interactive Patient Education  2017 [employer].

## 2019-09-20 DIAGNOSIS — M47894 Other spondylosis, thoracic region: Secondary | ICD-10-CM | POA: Diagnosis not present

## 2019-09-20 DIAGNOSIS — M4326 Fusion of spine, lumbar region: Secondary | ICD-10-CM | POA: Diagnosis not present

## 2019-09-20 DIAGNOSIS — M545 Low back pain: Secondary | ICD-10-CM | POA: Diagnosis not present

## 2019-09-20 DIAGNOSIS — M546 Pain in thoracic spine: Secondary | ICD-10-CM | POA: Diagnosis not present

## 2019-09-21 ENCOUNTER — Other Ambulatory Visit: Payer: Self-pay | Admitting: Family Medicine

## 2019-10-02 ENCOUNTER — Other Ambulatory Visit: Payer: Self-pay | Admitting: Nurse Practitioner

## 2019-10-06 DIAGNOSIS — M546 Pain in thoracic spine: Secondary | ICD-10-CM | POA: Diagnosis not present

## 2019-10-12 DIAGNOSIS — E039 Hypothyroidism, unspecified: Secondary | ICD-10-CM | POA: Diagnosis not present

## 2019-10-12 DIAGNOSIS — E1142 Type 2 diabetes mellitus with diabetic polyneuropathy: Secondary | ICD-10-CM | POA: Diagnosis not present

## 2019-10-12 DIAGNOSIS — E7800 Pure hypercholesterolemia, unspecified: Secondary | ICD-10-CM | POA: Diagnosis not present

## 2019-10-12 LAB — HEMOGLOBIN A1C: Hemoglobin A1C: 8.1

## 2019-10-18 ENCOUNTER — Telehealth: Payer: Self-pay | Admitting: Family Medicine

## 2019-10-18 NOTE — Progress Notes (Signed)
  Chronic Care Management   Note  10/18/2019 Name: [name] [name] MRN: [id number] DOB: [birth year]  [name] [name] is a 74 y.o. year old female who is a primary care patient of Birdie Riddle, Aundra Millet, MD. I reached out to Jonette Eva by phone today in response to a referral sent by [name] PCP, Midge Minium, MD.   [name] was given information about Chronic Care Management services today including:  1. CCM service includes personalized support from designated clinical staff supervised by her physician, including individualized plan of care and coordination with other care providers 2. 24/7 contact phone numbers for assistance for urgent and routine care needs. 3. Service will only be billed when office clinical staff spend 20 minutes or more in a month to coordinate care. 4. Only one practitioner may furnish and bill the service in a calendar month. 5. The patient may stop CCM services at any time (effective at the end of the month) by phone call to the office staff.   Patient wishes to consider information provided and/or speak with a member of the care team before deciding about enrollment in care management services.   Follow up plan:   Carley Perdue [employer] Scheduler

## 2019-10-19 DIAGNOSIS — M4326 Fusion of spine, lumbar region: Secondary | ICD-10-CM | POA: Diagnosis not present

## 2019-10-19 DIAGNOSIS — Z6822 Body mass index (BMI) 22.0-22.9, adult: Secondary | ICD-10-CM | POA: Diagnosis not present

## 2019-10-19 DIAGNOSIS — E049 Nontoxic goiter, unspecified: Secondary | ICD-10-CM | POA: Diagnosis not present

## 2019-10-19 DIAGNOSIS — E039 Hypothyroidism, unspecified: Secondary | ICD-10-CM | POA: Diagnosis not present

## 2019-10-19 DIAGNOSIS — G609 Hereditary and idiopathic neuropathy, unspecified: Secondary | ICD-10-CM | POA: Diagnosis not present

## 2019-10-19 DIAGNOSIS — E1142 Type 2 diabetes mellitus with diabetic polyneuropathy: Secondary | ICD-10-CM | POA: Diagnosis not present

## 2019-10-19 DIAGNOSIS — M546 Pain in thoracic spine: Secondary | ICD-10-CM | POA: Diagnosis not present

## 2019-10-19 DIAGNOSIS — N189 Chronic kidney disease, unspecified: Secondary | ICD-10-CM | POA: Diagnosis not present

## 2019-10-19 DIAGNOSIS — E7800 Pure hypercholesterolemia, unspecified: Secondary | ICD-10-CM | POA: Diagnosis not present

## 2019-10-19 DIAGNOSIS — Z794 Long term (current) use of insulin: Secondary | ICD-10-CM | POA: Diagnosis not present

## 2019-10-19 DIAGNOSIS — M545 Low back pain: Secondary | ICD-10-CM | POA: Diagnosis not present

## 2019-10-31 DIAGNOSIS — L821 Other seborrheic keratosis: Secondary | ICD-10-CM | POA: Diagnosis not present

## 2019-10-31 DIAGNOSIS — Z23 Encounter for immunization: Secondary | ICD-10-CM | POA: Diagnosis not present

## 2019-10-31 DIAGNOSIS — D225 Melanocytic nevi of trunk: Secondary | ICD-10-CM | POA: Diagnosis not present

## 2019-10-31 DIAGNOSIS — D2262 Melanocytic nevi of left upper limb, including shoulder: Secondary | ICD-10-CM | POA: Diagnosis not present

## 2019-10-31 DIAGNOSIS — D1801 Hemangioma of skin and subcutaneous tissue: Secondary | ICD-10-CM | POA: Diagnosis not present

## 2019-10-31 DIAGNOSIS — L570 Actinic keratosis: Secondary | ICD-10-CM | POA: Diagnosis not present

## 2019-11-03 ENCOUNTER — Other Ambulatory Visit: Payer: Self-pay | Admitting: Family Medicine

## 2019-11-03 ENCOUNTER — Other Ambulatory Visit: Payer: Self-pay | Admitting: Nurse Practitioner

## 2019-11-06 ENCOUNTER — Encounter: Payer: Self-pay | Admitting: Family Medicine

## 2019-11-06 NOTE — Telephone Encounter (Signed)
Last OV 08/02/19 Alprazolam last filled 06/29/19

## 2019-11-08 ENCOUNTER — Telehealth: Payer: Self-pay | Admitting: Family Medicine

## 2019-11-08 NOTE — Progress Notes (Signed)
°  Chronic Care Management   Note  11/08/2019 Name: [name] [name] MRN: [id number] DOB: [birth year]  [name] [name] is a 74 y.o. year old female who is a primary care patient of Birdie Riddle, Aundra Millet, MD. I reached out to Jonette Eva by phone today in response to a referral sent by [name] [name] PCP, Midge Minium, MD.   [name] [name] was given information about Chronic Care Management services today including:  1. CCM service includes personalized support from designated clinical staff supervised by her physician, including individualized plan of care and coordination with other care providers 2. 24/7 contact phone numbers for assistance for urgent and routine care needs. 3. Service will only be billed when office clinical staff spend 20 minutes or more in a month to coordinate care. 4. Only one practitioner may furnish and bill the service in a calendar month. 5. The patient may stop CCM services at any time (effective at the end of the month) by phone call to the office staff.   Patient wishes to consider information provided and/or speak with a member of the care team before deciding about enrollment in care management services.   Follow up plan:   [name] [name] [employer] Scheduler

## 2019-11-15 ENCOUNTER — Ambulatory Visit (INDEPENDENT_AMBULATORY_CARE_PROVIDER_SITE_OTHER): Payer: Medicare Other | Admitting: Family Medicine

## 2019-11-15 ENCOUNTER — Encounter: Payer: Self-pay | Admitting: Family Medicine

## 2019-11-15 ENCOUNTER — Other Ambulatory Visit: Payer: Self-pay

## 2019-11-15 VITALS — BP 112/65 | HR 83 | Ht 64.0 in | Wt 128.0 lb

## 2019-11-15 DIAGNOSIS — G629 Polyneuropathy, unspecified: Secondary | ICD-10-CM | POA: Diagnosis not present

## 2019-11-15 DIAGNOSIS — G500 Trigeminal neuralgia: Secondary | ICD-10-CM

## 2019-11-15 DIAGNOSIS — M47812 Spondylosis without myelopathy or radiculopathy, cervical region: Secondary | ICD-10-CM

## 2019-11-15 MED ORDER — AMITRIPTYLINE HCL 25 MG PO TABS: 25.0000 mg | ORAL_TABLET | Freq: Every day | ORAL | 3 refills | Status: DC

## 2019-11-15 MED ORDER — GABAPENTIN 300 MG PO CAPS: ORAL_CAPSULE | ORAL | 3 refills | Status: DC

## 2019-11-15 NOTE — Patient Instructions (Addendum)
Below is our plan:  We will continue amitriptyline and gabapentin   Please make sure you are staying well hydrated. I recommend 50-60 ounces daily. Well balanced diet and regular exercise encouraged.    Please continue follow up with care team as directed.   Follow up in 1 year   You may receive a survey regarding today's visit. I encourage you to leave honest feed back as I do use this information to improve patient care. Thank you for seeing me today!     General Headache Without Cause A headache is pain or discomfort that is felt around the head or neck area. There are many causes and types of headaches. In some cases, the cause may not be found. Follow these instructions at home: Watch your condition for any changes. Let your doctor know about them. Take these steps to help with your condition: Managing pain      Take over-the-counter and prescription medicines only as told by your doctor.  Lie down in a dark, quiet room when you have a headache.  If told, put ice on your head and neck area: ? Put ice in a plastic bag. ? Place a towel between your skin and the bag. ? Leave the ice on for 20 minutes, 2-3 times per day.  If told, put heat on the affected area. Use the heat source that your doctor recommends, such as a moist heat pack or a heating pad. ? Place a towel between your skin and the heat source. ? Leave the heat on for 20-30 minutes. ? Remove the heat if your skin turns bright red. This is very important if you are unable to feel pain, heat, or cold. You may have a greater risk of getting burned.  Keep lights dim if bright lights bother you or make your headaches worse. Eating and drinking  Eat meals on a regular schedule.  If you drink alcohol: ? Limit how much you use to:  0-1 drink a day for women.  0-2 drinks a day for men. ? Be aware of how much alcohol is in your drink. In the [place] one drink equals one 12 oz bottle of beer (355 mL), one 5 oz glass  of wine (148 mL), or one 1 oz glass of hard liquor (44 mL).  Stop drinking caffeine, or reduce how much caffeine you drink. General instructions   Keep a journal to find out if certain things bring on headaches. For example, write down: ? What you eat and drink. ? How much sleep you get. ? Any change to your diet or medicines.  Get a massage or try other ways to relax.  Limit stress.  Sit up straight. Do not tighten (tense) your muscles.  Do not use any products that contain nicotine or tobacco. This includes cigarettes, e-cigarettes, and chewing tobacco. If you need help quitting, ask your doctor.  Exercise regularly as told by your doctor.  Get enough sleep. This often means 7-9 hours of sleep each night.  Keep all follow-up visits as told by your doctor. This is important. Contact a doctor if:  Your symptoms are not helped by medicine.  You have a headache that feels different than the other headaches.  You feel sick to your stomach (nauseous) or you throw up (vomit).  You have a fever. Get help right away if:  Your headache gets very bad quickly.  Your headache gets worse after a lot of physical activity.  You keep throwing up.  You have a stiff neck.  You have trouble seeing.  You have trouble speaking.  You have pain in the eye or ear.  Your muscles are weak or you lose muscle control.  You lose your balance or have trouble walking.  You feel like you will pass out (faint) or you pass out.  You are mixed up (confused).  You have a seizure. Summary  A headache is pain or discomfort that is felt around the head or neck area.  There are many causes and types of headaches. In some cases, the cause may not be found.  Keep a journal to help find out what causes your headaches. Watch your condition for any changes. Let your doctor know about them.  Contact a doctor if you have a headache that is different from usual, or if your headache is not helped  by medicine.  Get help right away if your headache gets very bad, you throw up, you have trouble seeing, you lose your balance, or you have a seizure. This information is not intended to replace advice given to you by your health care provider. Make sure you discuss any questions you have with your health care provider. Document Revised: 07/26/2017 Document Reviewed: 07/26/2017 Elsevier Patient Education  [place].

## 2019-11-15 NOTE — Progress Notes (Signed)
I have read the note, and I agree with the clinical assessment and plan.  Demiya Magno K Dominico Rod   

## 2019-11-15 NOTE — Progress Notes (Signed)
Chief Complaint  Patient presents with  . Follow-up    rm 1  . Headache    pt has no new sx or concerns     HISTORY OF PRESENT ILLNESS: Today 11/15/19  [name] [name] is a 74 y.o. female here today for follow up for headaches.  She continues amitriptyline 25mg  and gabapentin 300/300/600 for management of head and neck pain. Imaging shows cervical spondylosis. She reports that both headaches, neck and back pain, as well as neuralgia pain are all well managed on this combination. She has felt more anxiety recently due to worsening diabetes. She is now on basil and meal coverage insulin. She is working closely with PCP. She had lumbar surgery last year and has recovered well. She does feel that she has some word finding concerns from time to time. She feels overwhelmed with new insulin regimen. She is very active. Loves to read. She participates in church groups.    HISTORY (copied from Dr Jannifer Franklin' note on 11/15/2018)  Ms. [name] [name] is a 74 year old right-handed white female with a history of a headache syndrome that is associated with sharp jabbing pain coming up from the right side of the neck into the right frontotemporal area.  This may occur on average once or twice a week.  The patient is on amitriptyline and gabapentin, she was on propranolol but stopped this medication.  She has diabetes and is currently on insulin, she does have a peripheral neuropathy associated with this.  She recently fell in May 2020 and injured her low back which required 2 surgical procedures, one on 25 July 2018 and the second on 20 September 2018.  The patient had severe sciatica on involving the right leg with weakness of the right leg.  She is now getting over her second surgery, she is in physical therapy.  She is wearing a low back brace.  She at some point wishes to come off of the amitriptyline but she is still having a lot of back pain and muscle spasm.  She remains on gabapentin, she tolerates this fairly  well.    REVIEW OF SYSTEMS: Out of a complete 14 system review of symptoms, the patient complains only of the following symptoms, headaches, neck pain, back pain, neuropathy, anxiety and all other reviewed systems are negative.   ALLERGIES: Allergies  Allergen Reactions  . Rocephin [Ceftriaxone] Anaphylaxis  . Prednisone Other (See Comments)    High blood sugars (pt reports)  . Other     VERY DELICATE SKIN FROM MELANOMA   . Benadryl [Diphenhydramine Hcl] Anxiety    Hyperactivity     HOME MEDICATIONS: Outpatient Medications Prior to Visit  Medication Sig Dispense Refill  . ALPRAZolam (XANAX) 0.5 MG tablet TAKE 1 TABLET(0.5 MG) BY MOUTH AT BEDTIME AS NEEDED FOR SLEEP 30 tablet 1  . amitriptyline (ELAVIL) 25 MG tablet Take 1 tablet (25 mg total) by mouth at bedtime. 90 tablet 3  . baclofen (LIORESAL) 10 MG tablet Take 10 mg by mouth 3 (three) times daily.    . BD PEN NEEDLE NANO 2ND GEN 32G X 4 MM MISC daily. as directed    . Cholecalciferol (VITAMIN D3) 2000 UNITS TABS Take 1 tablet by mouth daily.     . Continuous Blood Gluc Receiver (FREESTYLE LIBRE READER) DEVI by Does not apply route.    . diclofenac Sodium (VOLTAREN) 1 % GEL Apply topically.    . famotidine (PEPCID) 20 MG tablet TAKE 1 TABLET(20 MG) BY MOUTH AT  BEDTIME 90 tablet 1  . gabapentin (NEURONTIN) 300 MG capsule TAKE ONE CAPSULE BY MOUTH EVERY MORNING,AROUND 2 PM AND 2 CAPSULES AT BEDTIME 120 capsule 6  . hyoscyamine (LEVSIN SL) 0.125 MG SL tablet DISSOLVE 1 TABLET(0.125 MG) UNDER THE TONGUE EVERY 6 HOURS AS NEEDED FOR CRAMPING 90 tablet 1  . Insulin Aspart (NOVOLOG FLEXPEN [place]) Inject into the skin.    [name] LEVEMIR FLEXTOUCH 100 UNIT/ML Pen Inject 6 Units into the skin at bedtime.     [name] levothyroxine (SYNTHROID, LEVOTHROID) 50 MCG tablet TK 1 T PO QD  12  . Lifitegrast (XIIDRA) 5 % SOLN Apply to eye.    . Melatonin 5 MG TABS Take 1 tablet by mouth at bedtime.    . Multiple Vitamin (MULTIVITAMIN) tablet Take 1 tablet by  mouth daily.      . pantoprazole (PROTONIX) 40 MG tablet Take 1 tablet (40 mg total) by mouth daily. 180 tablet 1  . Probiotic Product (PROBIOTIC PO) Take 1 tablet by mouth daily.    . rosuvastatin (CRESTOR) 10 MG tablet TAKE 1 TABLET(10 MG) BY MOUTH DAILY 90 tablet 1  . Lancets (ONETOUCH DELICA PLUS [id number]) MISC 2 (two) times daily. as directed    . ONETOUCH VERIO test strip 2 (two) times daily. for testing  5   No facility-administered medications prior to visit.     PAST MEDICAL HISTORY: Past Medical History:  Diagnosis Date  . Abdominal pain    EGD and CTs neg, rx Amitriptiline (non ulcer dyspepsia) EGD 12/11.. s/p ballon dilitation  . Anxiety   . Cataract   . Cervical spondylosis without myelopathy 11/30/2012  . Chronic conjunctivitis   . Diabetes mellitus    no meds, diet controlled  . Diverticulosis of colon   . GERD with stricture    s/p dilatations  . History of Lyme disease   . HOH (hard of hearing)    bilateral hearing aids  . Hyperlipidemia   . Hypothyroidism     Dx 10-9 TSH 5.2 , then TSH normalized, eventually went up again 2012  . Melanoma ([place])    h/o  . Memory difficulties 06/05/2014  . Osteopenia   . Solar lentigo    face; s/p excision 3/09. See Derm q 6 months  . Trigeminal neuralgia    h/o      PAST SURGICAL HISTORY: Past Surgical History:  Procedure Laterality Date  . ABDOMINAL HYSTERECTOMY    . ANKLE SURGERY  07/01/2015   R andkle tendon surgery  . BREAST BIOPSY Left 1988  . BREAST EXCISIONAL BIOPSY Left 1988  . CERVICAL FUSION  06/2017  . CHOLECYSTECTOMY    . COLON RESECTION  1999   d/t diverticulitis  . COLON SURGERY    . COLONOSCOPY    . ESOPHAGEAL MANOMETRY N/A 03/17/2019   Procedure: ESOPHAGEAL MANOMETRY (EM)  NO PH;  Surgeon: Lavena Bullion, DO;  Location: [facility];  Service: Gastroenterology;  Laterality: N/A;  . LUMBAR [place] SURGERY  07/25/18, 09/20  . melanoma resection Right     Right Arm Surgery, R facial surgery  (05-2012)   . PTX s/t chest tube    . SHOULDER SURGERY     right  . UPPER GASTROINTESTINAL ENDOSCOPY    . WRIST SURGERY     removed melaoma, right     FAMILY HISTORY: Family History  Problem Relation Age of Onset  . Prostate cancer Father   . Endometrial cancer Sister        clear  cell carcinoma  . Stroke Sister   . Heart attack Paternal Grandfather        GF in his 23s  . Melanoma Maternal Uncle   . Colon cancer Neg Hx   . Breast cancer Neg Hx   . Diabetes Neg Hx   . Stomach cancer Neg Hx   . Rectal cancer Neg Hx   . Esophageal cancer Neg Hx      SOCIAL HISTORY: Social History   Socioeconomic History  . Marital status: Married    Spouse name: Percell Miller  . Number of children: 2  . Years of education: college  . Highest education level: Not on file  Occupational History  . Occupation: retired    Fish farm manager: RETIRED  Tobacco Use  . Smoking status: Former Smoker    Start date: 01/20/1976    Quit date: 1981    Years since quitting: 40.8  . Smokeless tobacco: Never Used  Vaping Use  . Vaping Use: Never used  Substance and Sexual Activity  . Alcohol use: Yes    Alcohol/week: 2.0 - 3.0 standard drinks    Types: 2 - 3 Glasses of wine per week  . Drug use: No  . Sexual activity: Not on file  Other Topics Concern  . Not on file  Social History Narrative   Patient is married with 2 children.   Patient is right handed.   Patient has college education.   Patient does not drink caffeine.          Social Determinants of Health   Financial Resource Strain: Low Risk   . Difficulty of Paying Living Expenses: Not hard at all  Food Insecurity: No Food Insecurity  . Worried About Charity fundraiser in the Last Year: Never true  . Ran Out of Food in the Last Year: Never true  Transportation Needs: No Transportation Needs  . Lack of Transportation (Medical): No  . Lack of Transportation (Non-Medical): No  Physical Activity: Sufficiently Active  . Days of Exercise per  Week: 3 days  . Minutes of Exercise per Session: 50 min  Stress: No Stress Concern Present  . Feeling of Stress : Only a little  Social Connections: Socially Integrated  . Frequency of Communication with Friends and Family: More than three times a week  . Frequency of Social Gatherings with Friends and Family: More than three times a week  . Attends Religious Services: More than 4 times per year  . Active Member of Clubs or Organizations: Yes  . Attends Archivist Meetings: More than 4 times per year  . Marital Status: Married  Human resources officer Violence: Not At Risk  . Fear of Current or Ex-Partner: No  . Emotionally Abused: No  . Physically Abused: No  . Sexually Abused: No      PHYSICAL EXAM  Vitals:   11/15/19 1250  BP: 112/65  Pulse: 83  Weight: 128 lb (58.1 kg)  Height: 5\' 4"  (1.626 m)   Body mass index is 21.97 kg/m.   Generalized: Well developed, in no acute distress   Neurological examination  Mentation: Alert oriented to time, place, history taking. Follows all commands speech and language fluent Cranial nerve II-XII: Pupils were equal round reactive to light. Extraocular movements were full, visual field were full on confrontational test. Facial sensation and strength were normal. Head turning and shoulder shrug  were normal and symmetric. Motor: The motor testing reveals 5 over 5 strength of all 4 extremities. Good symmetric motor  tone is noted throughout.  Sensory: Sensory testing is intact to soft touch on all 4 extremities. No evidence of extinction is noted.  Coordination: Cerebellar testing reveals good finger-nose-finger and heel-to-shin bilaterally.  Gait and station: Gait is normal. Tandem gait is normal. Romberg is negative. No drift is seen.  Reflexes: Deep tendon reflexes are symmetric and normal bilaterally.     DIAGNOSTIC DATA (LABS, IMAGING, TESTING) - I reviewed patient records, labs, notes, testing and imaging myself where  available.  Lab Results  Component Value Date   WBC 5.6 08/02/2019   HGB 13.4 08/02/2019   HCT 40.3 08/02/2019   MCV 91.6 08/02/2019   PLT 218.0 08/02/2019      Component Value Date/Time   NA 143 08/02/2019 1121   NA 143 03/12/2014 0000   NA 143 03/12/2014 0000   K 4.6 08/02/2019 1121   CL 102 08/02/2019 1121   CO2 34 (H) 08/02/2019 1121   GLUCOSE 155 (H) 08/02/2019 1121   GLUCOSE 126 (H) 11/11/2005 0831   BUN 24 (H) 08/02/2019 1121   BUN 27 (A) 03/12/2014 0000   CREATININE 0.87 08/02/2019 1121   CALCIUM 9.7 08/02/2019 1121   PROT 6.8 08/02/2019 1121   ALBUMIN 4.4 08/02/2019 1121   AST 17 08/02/2019 1121   ALT 13 08/02/2019 1121   ALKPHOS 88 08/02/2019 1121   BILITOT 0.5 08/02/2019 1121   GFRNONAA >60 02/10/2019 1817   GFRAA >60 02/10/2019 1817   Lab Results  Component Value Date   CHOL 181 08/02/2019   HDL 71.30 08/02/2019   LDLCALC 95 08/02/2019   LDLDIRECT 148.1 06/11/2006   TRIG 76.0 08/02/2019   CHOLHDL 3 08/02/2019   Lab Results  Component Value Date   HGBA1C 7.3 04/18/2019   Lab Results  Component Value Date   VCBSWHQP59 163 06/05/2014   Lab Results  Component Value Date   TSH 4.84 (H) 03/03/2019      ASSESSMENT AND PLAN  74 y.o. year old female  has a past medical history of Abdominal pain, Anxiety, Cataract, Cervical spondylosis without myelopathy (11/30/2012), Chronic conjunctivitis, Diabetes mellitus, Diverticulosis of colon, GERD with stricture, History of Lyme disease, HOH (hard of hearing), Hyperlipidemia, Hypothyroidism, Melanoma ([place]), Memory difficulties (06/05/2014), Osteopenia, Solar lentigo, and Trigeminal neuralgia. here with   Cervical spondylosis without myelopathy  NEURALGIA, TRIGEMINAL-- on Neurontin, Elavil, propranolol  Neuropathy  Cheryllynn is doing well, today.  She feels that headaches, neck and back pain as well as neuropathy is well managed on amitriptyline 25 mg at bedtime and gabapentin 600 mg in the morning and 600 mg in  the evening.  We will continue current treatment plan.  We have discussed concerns of word finding difficulty.  I have reassured her that it does not sound like a neurodegenerative form of memory loss.  She is able to manage her home, drive without difficulty and take care of her community.  I feel that anxiety may be contributing to symptoms.  She will continue to follow closely with primary care. Healthy lifestyle habits encouraged. She will follow up with me in 1 year.   I spent 20 minutes of face-to-face and non-face-to-face time with patient.  This included previsit chart review, lab review, study review, order entry, electronic health record documentation, patient education.   Debbora Presto, MSN, FNP-C 11/15/2019, 1:33 PM  [facility] [address], [place], [place] [address] [phone]

## 2019-11-24 ENCOUNTER — Encounter: Payer: Self-pay | Admitting: Nurse Practitioner

## 2019-11-24 ENCOUNTER — Other Ambulatory Visit: Payer: Self-pay | Admitting: Nurse Practitioner

## 2019-11-24 ENCOUNTER — Ambulatory Visit (INDEPENDENT_AMBULATORY_CARE_PROVIDER_SITE_OTHER): Payer: Medicare Other | Admitting: Nurse Practitioner

## 2019-11-24 VITALS — BP 106/60 | HR 92 | Ht 64.0 in | Wt 124.8 lb

## 2019-11-24 DIAGNOSIS — Z794 Long term (current) use of insulin: Secondary | ICD-10-CM

## 2019-11-24 DIAGNOSIS — R1012 Left upper quadrant pain: Secondary | ICD-10-CM | POA: Diagnosis not present

## 2019-11-24 DIAGNOSIS — E1169 Type 2 diabetes mellitus with other specified complication: Secondary | ICD-10-CM | POA: Diagnosis not present

## 2019-11-24 DIAGNOSIS — K219 Gastro-esophageal reflux disease without esophagitis: Secondary | ICD-10-CM

## 2019-11-24 DIAGNOSIS — R634 Abnormal weight loss: Secondary | ICD-10-CM | POA: Diagnosis not present

## 2019-11-24 MED ORDER — HYOSCYAMINE SULFATE 0.125 MG SL SUBL: SUBLINGUAL_TABLET | SUBLINGUAL | 0 refills | Status: DC

## 2019-11-24 NOTE — Progress Notes (Signed)
11/24/2019 [name] [name] [id number] 06-Aug-1945   Chief Complaint: LUQ pain  History of Present Illness: [name] [name] is a 74 year old female with a past medical history of anxiety, DM IIon insulin, cervical spondylosis, prior Lyme disease, Hypothyroidism, osteopenia, trigeminal neuralgia, melanoma, HOH and GERD and diverticulitis. Past cholecystectomy and partial left sided colon resection secondary to diverticular disease in the 1990's.   She is followed by Dr. Havery Moros, primarily for GERD symptoms.  She was referred to Dr. Bryan Lemma in our office for consideration for aTIF procedure earlier this year which she decided not to pursue.  She presents today for further evaluation regarding chronic left upper quadrant abdominal pain with recent weight loss.  She describes having pins and needle type pain to the left upper quadrant below her rib cage the a pinching feeling which occurs around 4 PM.  She eats dinner and within 1 hour she feels gas and bloat to the left upper quadrant which then radiates up into her epigastric area which occurs daily. No specific food triggers.  No nausea or vomiting.  She takes Hyoscyamine as needed with relief.  She also takes Tums which reduces her abdominal bloat. She remains on Protonix 40 mg in the morning and she takes Pepcid at nighttime which has reduced her heartburn symptoms.  She underwent an abdominal/pelvic CT scan 02/21/2019 due to having left upper quadrant abdominal pain.  The CT scan showed a normal liver and pancreas.  Small stable numerous splenic lesions likely hemangiomas were noted.  The stomach, duodenum and small bowel are unremarkable.  A moderate amount of stool was noted throughout the colon.  Started taking MiraLAX which resulted in fairly normal bowel movements. She reports losing 16 pounds since January which she and her endocrinologist attributes to uncontrolled diabetes.  Her most recent hemoglobin A1c level was 8.1.  She was started  on a long and fast acting insulin and she is due to follow-up with her endocrinologist next week.   EGD 07/19/2017:esophagus normal. Dilation was performed in the entire esophagus with a Savary dilator with mild resistance at 16 mm and 17 mm. [place] endoscopy showed an appropriate musocal wrent in the proximal esophagus / UES. A single 7 mm sessile polyp was found in the gastric body- removed via hot snare, benign.Other smaller (<28mm) benign appearing gastric polyps noted but not removed. The exam of the stomach was otherwise normal. Biopsies negative for HP,duodenum was normal.  Colonoscopy 03/01/2015:There were two sessile polyps in the sigmoid colon roughly 66mm in size each, and both were removed with cold forceps. There was mild pancolonic diverticulosis noted. The colonic mucosa was otherwise normal without inflammatory changes. Biopsies were taken from the right and left colon to rule out microscopic colitis. The terminal ileum was normal. The surgical anastosmosis in the sigmoid colon was widely patent and appeared normal. Retroflexed views revealed internal hemorrhoids.Biopsies showed benign colonic mucosa.Recall colonoscopy 10 years.  CTAP with contrast 02/21/2019: 1. No acute abdominal/pelvic findings, mass lesions or adenopathy. 2. Stable numerous small low-attenuation splenic lesions, likely benign hemangiomas. 3. Status post cholecystectomy. No biliary dilatation. 4. Moderate to large amount of stool throughout the colon and down into the rectum suggesting constipation. 5. Stable advanced atherosclerotic calcifications involving the aorta and branch vessels.  Current Outpatient Medications on File Prior to Visit  Medication Sig Dispense Refill  . ALPRAZolam (XANAX) 0.5 MG tablet TAKE 1 TABLET(0.5 MG) BY MOUTH AT BEDTIME AS NEEDED FOR SLEEP (Patient taking differently: Take 0.5  mg by mouth at bedtime as needed. TAKE 1 TABLET(0.5 MG) BY MOUTH AT BEDTIME AS NEEDED FOR SLEEP) 30  tablet 1  . amitriptyline (ELAVIL) 25 MG tablet Take 1 tablet (25 mg total) by mouth at bedtime. 90 tablet 3  . baclofen (LIORESAL) 10 MG tablet Take 10 mg by mouth 3 (three) times daily.    . BD PEN NEEDLE NANO 2ND GEN 32G X 4 MM MISC daily. as directed    . Cholecalciferol (VITAMIN D3) 2000 UNITS TABS Take 1 tablet by mouth daily.     . Continuous Blood Gluc Receiver (FREESTYLE LIBRE READER) DEVI by Does not apply route.    . diclofenac Sodium (VOLTAREN) 1 % GEL Apply topically.    . docusate sodium (COLACE) 100 MG capsule Take 200 mg by mouth See admin instructions. I take it every 2 days before I go to bed    . famotidine (PEPCID) 20 MG tablet TAKE 1 TABLET(20 MG) BY MOUTH AT BEDTIME (Patient taking differently: Take 20 mg by mouth at bedtime. ) 90 tablet 1  . gabapentin (NEURONTIN) 300 MG capsule TAKE TWO CAPSULES IN THE MORNING AND 2 CAPSULES AT BEDTIME 360 capsule 3  . Insulin Aspart (NOVOLOG FLEXPEN [place]) Inject into the skin. Per sliding scale    . LEVEMIR FLEXTOUCH 100 UNIT/ML Pen Inject 6 Units into the skin at bedtime.     [name] levothyroxine (SYNTHROID, LEVOTHROID) 50 MCG tablet Take 50 mcg by mouth daily before breakfast.   12  . Lifitegrast (XIIDRA) 5 % SOLN Apply to eye.    . Melatonin 5 MG TABS Take 1 tablet by mouth at bedtime.    . Multiple Vitamin (MULTIVITAMIN) tablet Take 1 tablet by mouth daily.      . pantoprazole (PROTONIX) 40 MG tablet Take 1 tablet (40 mg total) by mouth daily. 180 tablet 1  . polyethylene glycol (MIRALAX / GLYCOLAX) 17 g packet Take 17 g by mouth daily.    . Probiotic Product (PROBIOTIC PO) Take 1 tablet by mouth daily.    . rosuvastatin (CRESTOR) 10 MG tablet TAKE 1 TABLET(10 MG) BY MOUTH DAILY (Patient taking differently: Take 10 mg by mouth daily. ) 90 tablet 1   No current facility-administered medications on file prior to visit.   Allergies  Allergen Reactions  . Rocephin [Ceftriaxone] Anaphylaxis  . Prednisone Other (See Comments)    High blood  sugars (pt reports)  . Other     VERY DELICATE SKIN FROM MELANOMA   . Benadryl [Diphenhydramine Hcl] Anxiety    Hyperactivity     Current Medications, Allergies, Past Medical History, Past Surgical History, Family History and Social History were reviewed in [employer] record.   Review of Systems:   Constitutional: Negative for fever, sweats, chills or weight loss.  Respiratory: Negative for shortness of breath.   Cardiovascular: Negative for chest pain, palpitations and leg swelling.  Gastrointestinal: See HPI.  Musculoskeletal: Negative for back pain or muscle aches.  Neurological: Negative for dizziness, headaches or paresthesias.    Physical Exam: BP 106/60   Pulse 92   Ht 5\' 4"  (1.626 m)   Wt 124 lb 12.8 oz (56.6 kg)   SpO2 98%   BMI 21.42 kg/m    Wt Readings from Last 3 Encounters:  11/24/19 124 lb 12.8 oz (56.6 kg)  11/15/19 128 lb (58.1 kg)  09/18/19 126 lb 6.4 oz (57.3 kg)   02/15/2019     134lbs  General: 74 year old female in no  acute distress. Head: Normocephalic and atraumatic. Eyes: No scleral icterus. Conjunctiva pink . Ears: Normal auditory acuity. Mouth: Dentition intact. No ulcers or lesions.  Lungs: Clear throughout to auscultation. Heart: Regular rate and rhythm, no murmur. Abdomen: Soft, nondistended. LUQ tenderness without rebound or guarding.  No masses or hepatomegaly. Normal bowel sounds x 4 quadrants.  Rectal: Deferred. Musculoskeletal: Symmetrical with no gross deformities. Extremities: No edema. Neurological: Alert oriented x 4. No focal deficits.  Psychological: Alert and cooperative. Normal mood and affect  Assessment and Recommendations:  25.  74 year old female with GERD and chronic left upper quadrant abdominal pain.  CTAP 02/2019 showed a normal pancreas. -Continue pantoprazole 40 mg daily famotidine 20 mg at bedtime. -Gas-X and hyoscyamine 0.125 mg 1 tab sublingual both to be taken at 3 PM, Hyoscyamine may  also be taken 1 every 6 hours as needed for abdominal pain -Discussed repeat EGD if no improvement and if weight loss persists  2.  Weight loss, possibly due to uncontrolled diabetes -Consider abdominal MRI with focus to the pancreas if weight loss persists -Patient to follow-up in the office in 6 weeks.  She will call our office if her symptoms worsen prior to that time. -Follow-up with endocrinology as planned  3.  Constipation -To new MiraLAX and stool softener as tolerated  4. 3. Colon cancer screening. Colonoscopy 02/2015 2 polyps removed, biopsies showed benign colonic mucosa -Next colonoscopy due 02/2025 or earlier if clinically indicated

## 2019-11-24 NOTE — Progress Notes (Signed)
Agree with assessment and plan as outlined. Last EGD actually 12/2018, less than one year ago, I doubt will be high yield for her symptoms. Will await her course with plan as outlined.

## 2019-11-24 NOTE — Patient Instructions (Addendum)
If you are age 74 or older, your body mass index should be between 23-30. Your Body mass index is 21.42 kg/m. If this is out of the aforementioned range listed, please consider follow up with your Primary Care Provider.  If you are age 56 or younger, your body mass index should be between 19-25. Your Body mass index is 21.42 kg/m. If this is out of the aformentioned range listed, please consider follow up with your Primary Care Provider.   We have sent the following medications to your pharmacy for you to pick up at your convenience:  Hyosyamine  Please take hyosyamine 0.125mg  take one sublingual tablet (under the tongue) at 3 pm daily, as well as every 6 hours as needed.  Please purchase the following medications over the counter and take as directed:  START: Gas X take one pill by mouth at 3 pm daily.  You are scheduled to see Dr Havery Moros on 01-04-20 at 2:30pm,  Thank you for entrusting me with your care and choosing [facility].  Carl Best, NP

## 2019-11-29 DIAGNOSIS — E1142 Type 2 diabetes mellitus with diabetic polyneuropathy: Secondary | ICD-10-CM | POA: Diagnosis not present

## 2019-11-29 DIAGNOSIS — R109 Unspecified abdominal pain: Secondary | ICD-10-CM | POA: Diagnosis not present

## 2019-11-29 DIAGNOSIS — E7800 Pure hypercholesterolemia, unspecified: Secondary | ICD-10-CM | POA: Diagnosis not present

## 2019-11-29 DIAGNOSIS — G609 Hereditary and idiopathic neuropathy, unspecified: Secondary | ICD-10-CM | POA: Diagnosis not present

## 2019-11-29 DIAGNOSIS — E039 Hypothyroidism, unspecified: Secondary | ICD-10-CM | POA: Diagnosis not present

## 2019-11-29 DIAGNOSIS — N189 Chronic kidney disease, unspecified: Secondary | ICD-10-CM | POA: Diagnosis not present

## 2019-11-29 DIAGNOSIS — E049 Nontoxic goiter, unspecified: Secondary | ICD-10-CM | POA: Diagnosis not present

## 2019-11-29 DIAGNOSIS — Z794 Long term (current) use of insulin: Secondary | ICD-10-CM | POA: Diagnosis not present

## 2019-12-03 NOTE — Telephone Encounter (Signed)
[name] [name], please contact the patient and schedule her for an abdominal MRI with IV contrast (with focus on the pancreas to be noted in the comment section).  DX: unexplained weight loss. Hx of DM on insuline. Send her to the lab to have a BMP done to check her bu/cr a few days prior to the MRI date. thx   Dr. Juluis Rainier, refer to 11/5 office visit and my chart msg from pt

## 2019-12-04 ENCOUNTER — Other Ambulatory Visit: Payer: Self-pay

## 2019-12-04 ENCOUNTER — Telehealth: Payer: Self-pay

## 2019-12-04 DIAGNOSIS — Z794 Long term (current) use of insulin: Secondary | ICD-10-CM

## 2019-12-04 DIAGNOSIS — R634 Abnormal weight loss: Secondary | ICD-10-CM

## 2019-12-04 DIAGNOSIS — R1012 Left upper quadrant pain: Secondary | ICD-10-CM

## 2019-12-04 DIAGNOSIS — E1169 Type 2 diabetes mellitus with other specified complication: Secondary | ICD-10-CM

## 2019-12-04 DIAGNOSIS — Z9289 Personal history of other medical treatment: Secondary | ICD-10-CM

## 2019-12-04 NOTE — Telephone Encounter (Signed)
Patient given information on her MRI. 12/12/19 at [facility] Radiology Dept. Arrive at 9:30 am. Fast for 4 hours prior to imaging. Labs to be done at [facility] anytime between now and next Monday.

## 2019-12-05 ENCOUNTER — Other Ambulatory Visit (INDEPENDENT_AMBULATORY_CARE_PROVIDER_SITE_OTHER): Payer: Medicare Other

## 2019-12-05 DIAGNOSIS — R634 Abnormal weight loss: Secondary | ICD-10-CM

## 2019-12-05 LAB — BASIC METABOLIC PANEL
BUN: 26 mg/dL — ABNORMAL HIGH (ref 6–23)
CO2: 31 mEq/L (ref 19–32)
Calcium: 9.6 mg/dL (ref 8.4–10.5)
Chloride: 101 mEq/L (ref 96–112)
Creatinine, Ser: 0.96 mg/dL (ref 0.40–1.20)
GFR: 58.54 mL/min — ABNORMAL LOW (ref >60.00)
Glucose, Bld: 106 mg/dL — ABNORMAL HIGH (ref 70–99)
Potassium: 4.1 mEq/L (ref 3.5–5.1)
Sodium: 138 mEq/L (ref 135–145)

## 2019-12-12 ENCOUNTER — Ambulatory Visit (HOSPITAL_COMMUNITY)
Admission: RE | Admit: 2019-12-12 | Discharge: 2019-12-12 | Disposition: A | Discharge status: Home / Self Care | Payer: Medicare Other | Source: Ambulatory Visit | Attending: Nurse Practitioner | Admitting: Nurse Practitioner

## 2019-12-12 ENCOUNTER — Other Ambulatory Visit: Payer: Self-pay | Admitting: Nurse Practitioner

## 2019-12-12 ENCOUNTER — Other Ambulatory Visit: Payer: Self-pay

## 2019-12-12 DIAGNOSIS — R1012 Left upper quadrant pain: Secondary | ICD-10-CM

## 2019-12-12 DIAGNOSIS — N281 Cyst of kidney, acquired: Secondary | ICD-10-CM | POA: Diagnosis not present

## 2019-12-12 DIAGNOSIS — R634 Abnormal weight loss: Secondary | ICD-10-CM | POA: Diagnosis not present

## 2019-12-12 DIAGNOSIS — Z794 Long term (current) use of insulin: Secondary | ICD-10-CM

## 2019-12-12 DIAGNOSIS — E1169 Type 2 diabetes mellitus with other specified complication: Secondary | ICD-10-CM | POA: Insufficient documentation

## 2019-12-12 DIAGNOSIS — Z981 Arthrodesis status: Secondary | ICD-10-CM | POA: Diagnosis not present

## 2019-12-12 MED ORDER — GADOBUTROL 1 MMOL/ML IV SOLN
5.0000 mL | Freq: Once | INTRAVENOUS | Status: AC | PRN
  Administered 2019-12-12: 5 mL via INTRAVENOUS

## 2020-01-02 ENCOUNTER — Other Ambulatory Visit: Payer: Self-pay | Admitting: Family Medicine

## 2020-01-03 NOTE — Telephone Encounter (Signed)
LFD 11/06/19 #30 with 1 refill LOV 08/02/19 NOV none

## 2020-01-04 ENCOUNTER — Ambulatory Visit (INDEPENDENT_AMBULATORY_CARE_PROVIDER_SITE_OTHER): Payer: Medicare Other | Admitting: Gastroenterology

## 2020-01-04 ENCOUNTER — Encounter: Payer: Self-pay | Admitting: Gastroenterology

## 2020-01-04 VITALS — BP 110/60 | HR 96 | Ht 63.25 in | Wt 128.2 lb

## 2020-01-04 DIAGNOSIS — K5900 Constipation, unspecified: Secondary | ICD-10-CM

## 2020-01-04 DIAGNOSIS — R109 Unspecified abdominal pain: Secondary | ICD-10-CM

## 2020-01-04 DIAGNOSIS — R1012 Left upper quadrant pain: Secondary | ICD-10-CM | POA: Diagnosis not present

## 2020-01-04 DIAGNOSIS — K219 Gastro-esophageal reflux disease without esophagitis: Secondary | ICD-10-CM

## 2020-01-04 NOTE — Progress Notes (Signed)
HPI :  74 year old female here for follow-up visit.  She has a history of diverticulitis status post surgical resection remotely, constipation, GERD, dysphagia.  See prior notes for details of her case.  Her main complaint today has been left mid abdomen pain as well as constipation.  She has been seen by Carl Best a few times in recent months for this issue.  She describes discomfort in her left mid abdomen that usually starts around 3 to 4 PM each day.  Sometimes feels like burning pins/needles in her abdomen, sometimes it can radiate into her back.  Symptoms will often be present until she lies down at night and goes to sleep at which time it tends to resolve and she feels well in the morning until this recurs again.  She has noted that it can hurt when she has a bowel movement however having a bowel movement does seem to alleviate some of her discomfort.  She denies much prandial association with this.  No blood in her stools.  Again lying down seems to make this better although takes several minutes for this to occur.  She has been given trial of MiraLAX twice daily for constipation, hyoscyamine, Gas-X, and IBgard for her pain.  She states the MiraLAX nighttime dose is much better tolerated and she takes that, but she does not like taking it during the day.  That being said 1 dose of MiraLAX a day has not really helped her constipation too much.  She may have a bowel movement once every few days and when she goes with often stimulated by taking a few stool softeners before hand.  She has had episodes of fecal incontinence if she does not go to the bathroom for several days.  She does find reliable relief with hyoscyamine, as this will last for a few hours to get her through the afternoon early evening, however it tends to wear off after a few hours.  She has also taken some IBgard which also helps her pain however she thinks may cause some belching.  She takes baclofen for her back pain, does  not seem to help this.  She does have some chronic soreness on her left side and hurts to lie down on it.  She had previously lost some weight but really recently her weight has been stable around 128 pounds.  She has uncontrolled diabetes, she has been on a insulin regimen and her A1c is currently 8.1.  She states she has had some episodes of hypoglycemia recently.  She did have a CT scan of her abdomen pelvis with contrast in February which did not show any acute findings in her abdomen.  She did have a moderate to large amount of stool throughout the entire colon down through the rectum.  Colleen ordered an MRI of her abdomen to ensure her pancreas looked okay in light of the patient's worsening diabetes, the pancreas appeared normal.  There is some questionable gastric wall thickening, otherwise moderate stool burden throughout the entire colon but no focal stenosis or stricture appreciated.  The patient had an EGD about a year ago which showed no problems with her stomach.  She has had an MRI of her thoracic spine with her orthopedist and he did not see anything that were to cause her abdominal pain or any significant nerve impingement.  She states Dr. Deatra Ina put her on Elavil in 2005 for a " nervous stomach" and she is definitely thinks that has helped her over  the years.  She has also been taking gabapentin for chronic pain.  Aside from these issues, she states her reflux is actually pretty well controlled these days.  She takes Protonix 40 mg once in the morning and Pepcid 20 mg nightly.  She states this regimen has been doing pretty well for her in general.  She previously was going to pursue TIF with Dr. Bryan Lemma.  She had some medical problems around the time of when that was scheduled and she had to cancel it.  She states given she has been doing pretty well in that regard on the regimen she does not want to pursue TIF right now.   She has had a history of diverticulitis in the past.  She had  surgery for left-sided diverticulitis in the 1990s, she has had some recurrence of symptoms in years past.   EGD 07/19/2017 -esophagus normal. Dilation was performed in the entire esophagus with a Savary dilator with mild resistance at 16 mm and 17 mm. Relook endoscopy showed an appropriate musocal wrent in the proximal esophagus / UES. A single 7 mm sessile polyp was found in the gastric body- removed via hot snare, benign.Other smaller (<63mm) benign appearing gastric polyps noted but not removed. The exam of the stomach was otherwise normal. Biopsies negative for HP,duodenum was normal.  Colonoscopy 03/01/2015 -There were two sessile polyps in the sigmoid colon roughly 78mm in size each, and both were removed with cold forceps. There was mild pancolonic diverticulosis noted. The colonic mucosa was otherwise normal without inflammatory changes. Biopsies were taken from the right and left colon to rule out microscopic colitis. The terminal ileum was normal. The surgical anastosmosis in the sigmoid colon was widely patent and appeared normal. Retroflexed views revealed internal hemorrhoids.  EGD 01/06/19 - - A 1 cm hiatal hernia was present. - The examined esophagus was tortuous. - There was a subtle stenosis at the UES. The exam of the esophagus was otherwise normal. No esophagitis or other strictures appreciated. - A guidewire was placed and the scope was withdrawn. Dilation was performed in the entire esophagus with a Savary dilator with mild resistance at 16 mm and moderate resistance at 17 mm. Relook endoscopy showed small heme at the UES. - A few small sessile polyps were found in the gastric fundus and in the gastric body. Not sampled on this exam, benign polyps previously sampled on last exam. - The exam of the stomach was otherwise normal. Hill grade I views of the cardia noted. - The duodenal bulb and second portion of the duodenum were normal.  Barium swallow 01/24/19 -   IMPRESSION: 1. Nonspecific esophageal motility disorder. 2. Tertiary contractions noted in the middle and distal thirds of the esophagus on nearly all swallows. No evidence for overt presbyesophagus. 3. No mass lesion or stricture evident. 13 mm barium tablet passes readily into the stomach.  Esophageal Manometry 03/17/19 - normal  CTAP with contrast 02/21/2019: 1. No acute abdominal/pelvic findings, mass lesions or adenopathy. 2. Stable numerous small low-attenuation splenic lesions, likely benign hemangiomas. 3. Status post cholecystectomy. No biliary dilatation. 4. Moderate to large amount of stool throughout the colon and down into the rectum suggesting constipation. 5. Stable advanced atherosclerotic calcifications involving the aorta and branch vessels.  MRI abdomen with contrast 12/12/19 - IMPRESSION: 1. No acute findings or explanation for the patient's symptoms. 2. No evidence of pancreatic mass. 3. Gastric wall thickening, likely related to incomplete distension. Consider further evaluation with endoscopy or upper GI series,  especially if there are associated relevant symptoms. 4. Moderate stool throughout the visualized colon consistent with constipation.   Past Medical History:  Diagnosis Date  . Abdominal pain    EGD and CTs neg, rx Amitriptiline (non ulcer dyspepsia) EGD 12/11.. s/p ballon dilitation  . Anxiety   . Cataract   . Cervical spondylosis without myelopathy 11/30/2012  . Chronic conjunctivitis   . Diabetes mellitus    no meds, diet controlled  . Diverticulosis of colon   . GERD with stricture    s/p dilatations  . History of Lyme disease   . HOH (hard of hearing)    bilateral hearing aids  . Hyperlipidemia   . Hypothyroidism     Dx 10-9 TSH 5.2 , then TSH normalized, eventually went up again 2012  . Melanoma ([place])    h/o  . Memory difficulties 06/05/2014  . Osteopenia   . Solar lentigo    face; s/p excision 3/09. See Derm q 6 months  .  Trigeminal neuralgia    h/o      Past Surgical History:  Procedure Laterality Date  . ABDOMINAL HYSTERECTOMY    . ANKLE SURGERY  07/01/2015   R andkle tendon surgery  . BREAST BIOPSY Left 1988  . BREAST EXCISIONAL BIOPSY Left 1988  . CERVICAL FUSION  06/2017  . CHOLECYSTECTOMY    . COLON RESECTION  1999   d/t diverticulitis  . COLON SURGERY    . COLONOSCOPY    . ESOPHAGEAL MANOMETRY N/A 03/17/2019   Procedure: ESOPHAGEAL MANOMETRY (EM)  NO PH;  Surgeon: Lavena Bullion, DO;  Location: [facility];  Service: Gastroenterology;  Laterality: N/A;  . LUMBAR [place] SURGERY  07/25/18, 09/20  . melanoma resection Right     Right Arm Surgery, R facial surgery (05-2012)   . PTX s/t chest tube    . SHOULDER SURGERY     right  . UPPER GASTROINTESTINAL ENDOSCOPY    . WRIST SURGERY     removed melaoma, right   Family History  Problem Relation Age of Onset  . Prostate cancer Father   . Endometrial cancer Sister        clear cell carcinoma  . Stroke Sister   . Heart attack Paternal Grandfather        GF in his 2s  . Melanoma Maternal Uncle   . Colon cancer Neg Hx   . Breast cancer Neg Hx   . Diabetes Neg Hx   . Stomach cancer Neg Hx   . Rectal cancer Neg Hx   . Esophageal cancer Neg Hx    Social History   Tobacco Use  . Smoking status: Former Smoker    Start date: 01/20/1976    Quit date: 1981    Years since quitting: 40.9  . Smokeless tobacco: Never Used  Vaping Use  . Vaping Use: Never used  Substance Use Topics  . Alcohol use: Not Currently  . Drug use: No   Current Outpatient Medications  Medication Sig Dispense Refill  . ALPRAZolam (XANAX) 0.5 MG tablet TAKE 1 TABLET(0.5 MG) BY MOUTH AT BEDTIME AS NEEDED FOR SLEEP 30 tablet 1  . amitriptyline (ELAVIL) 25 MG tablet Take 1 tablet (25 mg total) by mouth at bedtime. 90 tablet 3  . baclofen (LIORESAL) 10 MG tablet Take 10 mg by mouth 3 (three) times daily.    . BD PEN NEEDLE NANO 2ND GEN 32G X 4 MM MISC daily. as  directed    . Cholecalciferol (VITAMIN  D3) 2000 UNITS TABS Take 1 tablet by mouth daily.    . Continuous Blood Gluc Receiver (FREESTYLE LIBRE READER) DEVI by Does not apply route.    . diclofenac Sodium (VOLTAREN) 1 % GEL Apply topically.    . docusate sodium (COLACE) 100 MG capsule Take 200 mg by mouth See admin instructions. I take it every 2 days before I go to bed    . famotidine (PEPCID) 20 MG tablet TAKE 1 TABLET(20 MG) BY MOUTH AT BEDTIME (Patient taking differently: Take 20 mg by mouth at bedtime.) 90 tablet 1  . gabapentin (NEURONTIN) 300 MG capsule TAKE TWO CAPSULES IN THE MORNING AND 2 CAPSULES AT BEDTIME 360 capsule 3  . hyoscyamine (LEVSIN SL) 0.125 MG SL tablet DISSOLVE 1 TABLET(0.125 MG) UNDER THE TONGUE EVERY 6 HOURS AS NEEDED FOR CRAMPING 368 tablet 1  . Insulin Aspart (NOVOLOG FLEXPEN [place]) Inject into the skin. Per sliding scale    . LEVEMIR FLEXTOUCH 100 UNIT/ML Pen Inject 6 Units into the skin at bedtime.     [name] levothyroxine (SYNTHROID, LEVOTHROID) 50 MCG tablet Take 50 mcg by mouth daily before breakfast.   12  . Lifitegrast 5 % SOLN Apply to eye.    . Melatonin 5 MG TABS Take 1 tablet by mouth at bedtime.    . Multiple Vitamin (MULTIVITAMIN) tablet Take 1 tablet by mouth daily.    . pantoprazole (PROTONIX) 40 MG tablet Take 1 tablet (40 mg total) by mouth daily. 180 tablet 1  . polyethylene glycol (MIRALAX / GLYCOLAX) 17 g packet Take 17 g by mouth daily.    . Probiotic Product (PROBIOTIC PO) Take 1 tablet by mouth daily.    . rosuvastatin (CRESTOR) 10 MG tablet TAKE 1 TABLET(10 MG) BY MOUTH DAILY (Patient taking differently: Take 10 mg by mouth daily.) 90 tablet 1   No current facility-administered medications for this visit.   Allergies  Allergen Reactions  . Rocephin [Ceftriaxone] Anaphylaxis  . Prednisone Other (See Comments)    High blood sugars (pt reports)  . Other     Can't use adhesive tape, VERY DELICATE SKIN FROM MELANOMA   . Tape   . Benadryl  [Diphenhydramine Hcl] Anxiety    Hyperactivity     Review of Systems: All systems reviewed and negative except where noted in HPI.    MR Abdomen W Wo Contrast  Result Date: 12/12/2019 CLINICAL DATA:  Abdominal pain with unintentional weight loss. History of diabetes, cholecystectomy and partial left colectomy for diverticulitis. EXAM: MRI ABDOMEN WITHOUT AND WITH CONTRAST TECHNIQUE: Multiplanar multisequence MR imaging of the abdomen was performed both before and after the administration of intravenous contrast. CONTRAST:  2mL GADAVIST GADOBUTROL 1 MMOL/ML IV SOLN COMPARISON:  Abdominopelvic CT 02/21/2019 and 05/27/2017. FINDINGS: Lower chest: No significant findings are seen within the visualized lower chest. Hepatobiliary: The liver is normal in signal without focal lesion or abnormal enhancement. No significant biliary dilatation status post cholecystectomy. No evidence of choledocholithiasis. Pancreas: Unremarkable. No evidence of pancreatic mass, ductal dilatation or surrounding inflammatory change. Spleen: Normal in size without focal abnormality. Adrenals/Urinary Tract: Both adrenal glands appear normal. Tiny cortical cyst in the lower pole of the right kidney. Small renal sinus cysts bilaterally. No evidence of renal mass or hydronephrosis. Stomach/Bowel: Gastric wall thickening appears similar to previous CT, likely related to incomplete distension. There is moderate stool throughout the visualized colon. No evidence of bowel wall thickening or significant distention. Vascular/Lymphatic: There are no enlarged abdominal lymph nodes. Aortic and branch  vessel atherosclerosis without acute vascular findings. The portal, superior mesenteric and splenic veins are patent. Other: Intact abdominal wall with postsurgical changes.  No ascites. Musculoskeletal: Previous lower lumbar fusion. No acute osseous findings. IMPRESSION: 1. No acute findings or explanation for the patient's symptoms. 2. No evidence  of pancreatic mass. 3. Gastric wall thickening, likely related to incomplete distension. Consider further evaluation with endoscopy or upper GI series, especially if there are associated relevant symptoms. 4. Moderate stool throughout the visualized colon consistent with constipation. Electronically Signed   By: Richardean Sale M.D.   On: 12/12/2019 13:59   MR 3D Recon At Scanner  Result Date: 12/12/2019 CLINICAL DATA:  Abdominal pain with unintentional weight loss. History of diabetes, cholecystectomy and partial left colectomy for diverticulitis. EXAM: MRI ABDOMEN WITHOUT AND WITH CONTRAST TECHNIQUE: Multiplanar multisequence MR imaging of the abdomen was performed both before and after the administration of intravenous contrast. CONTRAST:  108mL GADAVIST GADOBUTROL 1 MMOL/ML IV SOLN COMPARISON:  Abdominopelvic CT 02/21/2019 and 05/27/2017. FINDINGS: Lower chest: No significant findings are seen within the visualized lower chest. Hepatobiliary: The liver is normal in signal without focal lesion or abnormal enhancement. No significant biliary dilatation status post cholecystectomy. No evidence of choledocholithiasis. Pancreas: Unremarkable. No evidence of pancreatic mass, ductal dilatation or surrounding inflammatory change. Spleen: Normal in size without focal abnormality. Adrenals/Urinary Tract: Both adrenal glands appear normal. Tiny cortical cyst in the lower pole of the right kidney. Small renal sinus cysts bilaterally. No evidence of renal mass or hydronephrosis. Stomach/Bowel: Gastric wall thickening appears similar to previous CT, likely related to incomplete distension. There is moderate stool throughout the visualized colon. No evidence of bowel wall thickening or significant distention. Vascular/Lymphatic: There are no enlarged abdominal lymph nodes. Aortic and branch vessel atherosclerosis without acute vascular findings. The portal, superior mesenteric and splenic veins are patent. Other: Intact  abdominal wall with postsurgical changes.  No ascites. Musculoskeletal: Previous lower lumbar fusion. No acute osseous findings. IMPRESSION: 1. No acute findings or explanation for the patient's symptoms. 2. No evidence of pancreatic mass. 3. Gastric wall thickening, likely related to incomplete distension. Consider further evaluation with endoscopy or upper GI series, especially if there are associated relevant symptoms. 4. Moderate stool throughout the visualized colon consistent with constipation. Electronically Signed   By: Richardean Sale M.D.   On: 12/12/2019 13:59   Lab Results  Component Value Date   WBC 5.6 08/02/2019   HGB 13.4 08/02/2019   HCT 40.3 08/02/2019   MCV 91.6 08/02/2019   PLT 218.0 08/02/2019    Lab Results  Component Value Date   CREATININE 0.96 12/05/2019   BUN 26 (H) 12/05/2019   NA 138 12/05/2019   K 4.1 12/05/2019   CL 101 12/05/2019   CO2 31 12/05/2019    Lab Results  Component Value Date   ALT 13 08/02/2019   AST 17 08/02/2019   ALKPHOS 88 08/02/2019   BILITOT 0.5 08/02/2019     Physical Exam: BP 110/60 (BP Location: Left Arm, Patient Position: Sitting, Cuff Size: Normal)   Pulse 96   Ht 5' 3.25" (1.607 m)   Wt 128 lb 4 oz (58.2 kg)   BMI 22.54 kg/m  Constitutional: Pleasant,well-developed, female in no acute distress. Abdominal: Soft, nondistended, mild tenderness to palpation of left mid abdomen. There are no masses palpable.  Extremities: no edema Lymphadenopathy: No cervical adenopathy noted. Neurological: Alert and oriented to person place and time. Skin: Skin is warm and dry. No rashes noted. Psychiatric:  Normal mood and affect. Behavior is normal.   ASSESSMENT AND PLAN: 74 year old female here for reassessment of the following issues:  Abdominal pain / Constipation - as above she had a pretty extensive evaluation for the symptoms.  Imaging does not show anything too concerning, no evidence of malignancy etc.  I reassured her based  on the images to date so far I do think were missing anything bad.  I discussed differential diagnosis for her symptoms.  She is not moving her bowels regularly and is constipated, could have IBS-C with some bowel spasm which is causing this.  She does have chronic back pain and is possible she could have a component of musculoskeletal/neuropathic pain however given description of symptoms that may be a bit less likely.  She does have a surgical anastomosis in her sigmoid colon, that was widely patent on her last colonoscopy which is up-to-date.  A stricture at the anastomosis is possible however no focal abnormality was noted on 2 imaging studies.  I do think she would feel better if she moves her bowels more regularly.  She does not like taking MiraLAX during the day, would prefer to take it at night.  Recommend we increase her MiraLAX to 2 doses nightly in hopes of having a bowel movement regularly.  She has been using Levsin 1 tab in the afternoon but she can take an additional dose of this in the evening as this works quite well for her symptoms.  She can also combine this with IBgard which she states also helps.  I would like to see how she does with these changes over the next few weeks.  Moving forward if her constipation persists we may consider switching her from Elavil to nortriptyline which is less constipating, she was interested in considering that option.  If her symptoms persist despite multiple regimen changes as described then we may consider a colonoscopy to ensure her surgical anastomosis is patent.  She agreed with the plan I would like to see her back in 3 months for reassessment.  She agreed  GERD - doing pretty well on Protonix and Pepcid at this time.  Symptoms seem well controlled, she wishes to hold off on TIF at this time which is reasonable as long as she is doing well and understands the risks of long-term regimen.  She agreed  [place]oro [place] Cellar, MD [facility] Gastroenter[facility]

## 2020-01-04 NOTE — Patient Instructions (Addendum)
If you are age 74 or older, your body mass index should be between 23-30. Your Body mass index is 22.54 kg/m. If this is out of the aforementioned range listed, please consider follow up with your Primary Care Provider.  If you are age 71 or younger, your body mass index should be between 19-25. Your Body mass index is 22.54 kg/m. If this is out of the aformentioned range listed, please consider follow up with your Primary Care Provider.   Continue Protonix.   You can take Levsin twice in the afternoon/evening as needed.  Take IBGuard as needed.  Please take a double dose of Miralax in the evening.  Please follow up in 3 months.  Thank you for entrusting me with your care and for choosing [facility], Dr. [place] Cellar

## 2020-01-08 DIAGNOSIS — G609 Hereditary and idiopathic neuropathy, unspecified: Secondary | ICD-10-CM | POA: Diagnosis not present

## 2020-01-08 DIAGNOSIS — E7800 Pure hypercholesterolemia, unspecified: Secondary | ICD-10-CM | POA: Diagnosis not present

## 2020-01-08 DIAGNOSIS — E039 Hypothyroidism, unspecified: Secondary | ICD-10-CM | POA: Diagnosis not present

## 2020-01-08 DIAGNOSIS — N189 Chronic kidney disease, unspecified: Secondary | ICD-10-CM | POA: Diagnosis not present

## 2020-01-08 DIAGNOSIS — E1142 Type 2 diabetes mellitus with diabetic polyneuropathy: Secondary | ICD-10-CM | POA: Diagnosis not present

## 2020-01-08 DIAGNOSIS — E1065 Type 1 diabetes mellitus with hyperglycemia: Secondary | ICD-10-CM | POA: Diagnosis not present

## 2020-01-16 DIAGNOSIS — Z1152 Encounter for screening for COVID-19: Secondary | ICD-10-CM | POA: Diagnosis not present

## 2020-02-14 ENCOUNTER — Other Ambulatory Visit: Payer: Self-pay | Admitting: Family Medicine

## 2020-02-16 DIAGNOSIS — E1065 Type 1 diabetes mellitus with hyperglycemia: Secondary | ICD-10-CM | POA: Diagnosis not present

## 2020-02-22 DIAGNOSIS — H16223 Keratoconjunctivitis sicca, not specified as Sjogren's, bilateral: Secondary | ICD-10-CM | POA: Diagnosis not present

## 2020-02-22 DIAGNOSIS — H2513 Age-related nuclear cataract, bilateral: Secondary | ICD-10-CM | POA: Diagnosis not present

## 2020-02-22 DIAGNOSIS — E119 Type 2 diabetes mellitus without complications: Secondary | ICD-10-CM | POA: Diagnosis not present

## 2020-02-22 DIAGNOSIS — H0102B Squamous blepharitis left eye, upper and lower eyelids: Secondary | ICD-10-CM | POA: Diagnosis not present

## 2020-02-22 DIAGNOSIS — H0102A Squamous blepharitis right eye, upper and lower eyelids: Secondary | ICD-10-CM | POA: Diagnosis not present

## 2020-03-01 ENCOUNTER — Other Ambulatory Visit: Payer: Self-pay | Admitting: Family Medicine

## 2020-03-04 NOTE — Telephone Encounter (Signed)
Patient called stating that she is completely out of this medication.  Please send today

## 2020-03-04 NOTE — Telephone Encounter (Signed)
Xanax last rx 01/03/20 #30 1 RF LOV: 08/02/19 DM

## 2020-03-19 DIAGNOSIS — M65332 Trigger finger, left middle finger: Secondary | ICD-10-CM | POA: Diagnosis not present

## 2020-04-01 ENCOUNTER — Encounter: Payer: Self-pay | Admitting: Gastroenterology

## 2020-04-01 ENCOUNTER — Ambulatory Visit (INDEPENDENT_AMBULATORY_CARE_PROVIDER_SITE_OTHER): Payer: Medicare Other | Admitting: Gastroenterology

## 2020-04-01 VITALS — BP 120/68 | HR 78 | Ht 63.25 in | Wt 134.4 lb

## 2020-04-01 DIAGNOSIS — R109 Unspecified abdominal pain: Secondary | ICD-10-CM | POA: Diagnosis not present

## 2020-04-01 DIAGNOSIS — K219 Gastro-esophageal reflux disease without esophagitis: Secondary | ICD-10-CM | POA: Diagnosis not present

## 2020-04-01 DIAGNOSIS — K5900 Constipation, unspecified: Secondary | ICD-10-CM | POA: Diagnosis not present

## 2020-04-01 MED ORDER — IBGARD 90 MG PO CPCR: ORAL_CAPSULE | ORAL | Status: AC

## 2020-04-01 NOTE — Progress Notes (Signed)
HPI :  75 year old female here for follow-up visit for altered bowels and abdominal pains. See prior notes for details of her case.  She has a history of diverticulitis status post surgical resection remotely, constipation, GERD, dysphagia.    At her last visit we discussed her abdominal pain that have been going on for a few months.  She was getting left-sided abdominal discomfort which could come and go, often made her feel worse later in the day.  She did have some alleviation of the discomfort with a bowel movement.  She had lost some weight over time but this had stabilized.  She has diabetes and on an insulin regimen.  She had had cross-sectional imaging with a CT scan which did not show any acute changes last year.  She had a follow-up MRI which showed a normal-appearing pancreas in light of the patient's worsening diabetes.  She has had an EGD within the past few years which looked okay.  Colonoscopy also up-to-date.  At her last visit we had discussed options and I felt that perhaps her pain was due to IBS-C/bowel spasm, versus component of skill skeletal pain.  She has been taking IBgard twice daily, as well as MiraLAX once to twice daily.  Since she has been on this regimen generally she is been doing much better.  She is use Levsin only once since of last seen her which is a significant improvement.  This does help when she takes it.  She feels the MiraLAX dosing routinely has provided bowel movement most most of the time about once a day.  She has had a difficult time managing her diabetes, when she needs to eat more protein for that, that can be constipating for her and sometimes she may not go to the bathroom for a few days, and she takes MiraLAX twice daily during this time as well as her Colace and eventually has a bowel movement.  In general these episodes do not bother her too frequently and her bloating is significantly improved, abdominal pain is much better.  She is very thankful for  this.  She otherwise has had a history of reflux and has been taking Protonix 40 mg a day and Pepcid nightly.  She states this regimen is working very well for her.  We have discussed risk benefits of this.  I have had her see Dr. Bryan Lemma for consideration of TIF and she has wanted to hold off on that.  Prior work-up: EGD 07/19/2017 -esophagus normal. Dilation was performed in the entire esophagus with a Savary dilator with mild resistance at 16 mm and 17 mm. Relook endoscopy showed an appropriate musocal wrent in the proximal esophagus / UES. A single 7 mm sessile polyp was found in the gastric body- removed via hot snare, benign.Other smaller (<9mm) benign appearing gastric polyps noted but not removed. The exam of the stomach was otherwise normal. Biopsies negative for HP,duodenum was normal.  Colonoscopy 03/01/2015 -There were two sessile polyps in the sigmoid colon roughly 22mm in size each, and both were removed with cold forceps. There was mild pancolonic diverticulosis noted. The colonic mucosa was otherwise normal without inflammatory changes. Biopsies were taken from the right and left colon to rule out microscopic colitis. The terminal ileum was normal. The surgical anastosmosis in the sigmoid colon was widely patent and appeared normal. Retroflexed views revealed internal hemorrhoids.  EGD 01/06/19 - - A 1 cm hiatal hernia was present. - The examined esophagus was tortuous. - There was  a subtle stenosis at the UES. The exam of the esophagus was otherwise normal. No esophagitis or other strictures appreciated. - A guidewire was placed and the scope was withdrawn. Dilation was performed in the entire esophagus with a Savary dilator with mild resistance at 16 mm and moderate resistance at 17 mm. Relook endoscopy showed small heme at the UES. - A few small sessile polyps were found in the gastric fundus and in the gastric body. Not sampled on this exam, benign polyps previously  sampled on last exam. - The exam of the stomach was otherwise normal. Hill grade I views of the cardia noted. - The duodenal bulb and second portion of the duodenum were normal.  Barium swallow 01/24/19 - IMPRESSION: 1. Nonspecific esophageal motility disorder. 2. Tertiary contractions noted in the middle and distal thirds of the esophagus on nearly all swallows. No evidence for overt presbyesophagus. 3. No mass lesion or stricture evident. 13 mm barium tablet passes readily into the stomach.  Esophageal Manometry 03/17/19 - normal  CTAP with contrast 02/21/2019: 1. No acute abdominal/pelvic findings, mass lesions or adenopathy. 2. Stable numerous small low-attenuation splenic lesions, likely benign hemangiomas. 3. Status post cholecystectomy. No biliary dilatation. 4. Moderate to large amount of stool throughout the colon and down into the rectum suggesting constipation. 5. Stable advanced atherosclerotic calcifications involving the aorta and branch vessels.  MRI abdomen with contrast 12/12/19 - IMPRESSION: 1. No acute findings or explanation for the patient's symptoms. 2. No evidence of pancreatic mass. 3. Gastric wall thickening, likely related to incomplete distension. Consider further evaluation with endoscopy or upper GI series, especially if there are associated relevant symptoms. 4. Moderate stool throughout the visualized colon consistent with constipation.    Past Medical History:  Diagnosis Date  . Abdominal pain    EGD and CTs neg, rx Amitriptiline (non ulcer dyspepsia) EGD 12/11.. s/p ballon dilitation  . Anxiety   . Cataract   . Cervical spondylosis without myelopathy 11/30/2012  . Chronic conjunctivitis   . Diabetes mellitus    no meds, diet controlled  . Diverticulosis of colon   . GERD with stricture    s/p dilatations  . History of Lyme disease   . HOH (hard of hearing)    bilateral hearing aids  . Hypercholesterolemia   . Hyperlipidemia   .  Hypothyroidism     Dx 10-9 TSH 5.2 , then TSH normalized, eventually went up again 2012  . Melanoma ([place])    h/o  . Memory difficulties 06/05/2014  . Osteopenia   . Osteoporosis   . Solar lentigo    face; s/p excision 3/09. See Derm q 6 months  . Trigeminal neuralgia    h/o      Past Surgical History:  Procedure Laterality Date  . ABDOMINAL HYSTERECTOMY    . ANKLE SURGERY  07/01/2015   R andkle tendon surgery  . BREAST BIOPSY Left 1988  . BREAST EXCISIONAL BIOPSY Left 1988  . CERVICAL FUSION  06/2017  . CHOLECYSTECTOMY    . COLON RESECTION  1999   d/t diverticulitis  . COLON SURGERY    . COLONOSCOPY    . ESOPHAGEAL MANOMETRY N/A 03/17/2019   Procedure: ESOPHAGEAL MANOMETRY (EM)  NO PH;  Surgeon: Lavena Bullion, DO;  Location: [facility];  Service: Gastroenterology;  Laterality: N/A;  . LUMBAR [place] SURGERY  07/25/18, 09/20  . melanoma resection Right     Right Arm Surgery, R facial surgery (05-2012)   . PTX s/t chest tube    .  SHOULDER SURGERY     right  . UPPER GASTROINTESTINAL ENDOSCOPY    . WRIST SURGERY     removed melaoma, right   Family History  Problem Relation Age of Onset  . Prostate cancer Father   . Endometrial cancer Sister        clear cell carcinoma  . Stroke Sister   . Heart attack Paternal Grandfather        GF in his 18s  . Melanoma Maternal Uncle   . Colon cancer Neg Hx   . Breast cancer Neg Hx   . Diabetes Neg Hx   . Stomach cancer Neg Hx   . Rectal cancer Neg Hx   . Esophageal cancer Neg Hx    Social History   Tobacco Use  . Smoking status: Former Smoker    Types: Cigarettes    Start date: 01/20/1976    Quit date: 1981    Years since quitting: 41.2  . Smokeless tobacco: Never Used  Vaping Use  . Vaping Use: Never used  Substance Use Topics  . Alcohol use: Not Currently  . Drug use: No   Current Outpatient Medications  Medication Sig Dispense Refill  . ALPRAZolam (XANAX) 0.5 MG tablet TAKE 1 TABLET(0.5 MG) BY MOUTH AT BEDTIME  AS NEEDED FOR SLEEP (Patient taking differently: Take 0.5 mg by mouth every evening.) 30 tablet 1  . amitriptyline (ELAVIL) 25 MG tablet Take 1 tablet (25 mg total) by mouth at bedtime. 90 tablet 3  . baclofen (LIORESAL) 10 MG tablet Take 10 mg by mouth 2 (two) times daily. May take TID is needed    . BD PEN NEEDLE NANO 2ND GEN 32G X 4 MM MISC daily. as directed    . Cholecalciferol (VITAMIN D3) 2000 UNITS TABS Take 1 tablet by mouth daily.    . Continuous Blood Gluc Receiver (FREESTYLE LIBRE READER) DEVI by Does not apply route.    . diclofenac Sodium (VOLTAREN) 1 % GEL Apply 2 g topically as needed.    . docusate sodium (COLACE) 100 MG capsule Take 200 mg by mouth every other day.    . famotidine (PEPCID) 20 MG tablet TAKE 1 TABLET(20 MG) BY MOUTH AT BEDTIME (Patient taking differently: Take 20 mg by mouth at bedtime.) 90 tablet 1  . gabapentin (NEURONTIN) 300 MG capsule TAKE TWO CAPSULES IN THE MORNING AND 2 CAPSULES AT BEDTIME 360 capsule 3  . hyoscyamine (LEVSIN SL) 0.125 MG SL tablet Take 0.125 mg by mouth as needed. 368 tablet 1  . Insulin Aspart (NOVOLOG FLEXPEN [place]) Inject into the skin. Per sliding scale each meal    . insulin degludec (TRESIBA FLEXTOUCH) 100 UNIT/ML FlexTouch Pen Inject 4 Units into the skin at bedtime.    [name] levothyroxine (SYNTHROID, LEVOTHROID) 50 MCG tablet Take 50 mcg by mouth daily before breakfast. Take 90mcg M-F and 162mcg on Sat and Sun  12  . Lifitegrast 5 % SOLN Apply 1 each to eye as needed.    . Melatonin 5 MG TABS Take 1 tablet by mouth at bedtime.    . Multiple Vitamin (MULTIVITAMIN) tablet Take 1 tablet by mouth daily.    . pantoprazole (PROTONIX) 40 MG tablet Take 1 tablet (40 mg total) by mouth daily. 180 tablet 1  . polyethylene glycol (MIRALAX / GLYCOLAX) 17 g packet Take 34 g by mouth at bedtime as needed.    . Probiotic Product (PROBIOTIC PO) Take 1 tablet by mouth daily.    . rosuvastatin (CRESTOR) 10  MG tablet TAKE 1 TABLET(10 MG) BY MOUTH DAILY 90  tablet 1   No current facility-administered medications for this visit.   Allergies  Allergen Reactions  . Rocephin [Ceftriaxone] Anaphylaxis  . Prednisone Other (See Comments)    High blood sugars (pt reports)  . Other     Can't use adhesive tape, VERY DELICATE SKIN FROM MELANOMA   . Tape   . Benadryl [Diphenhydramine Hcl] Anxiety    Hyperactivity     Review of Systems: All systems reviewed and negative except where noted in HPI.   Lab Results  Component Value Date   WBC 5.6 08/02/2019   HGB 13.4 08/02/2019   HCT 40.3 08/02/2019   MCV 91.6 08/02/2019   PLT 218.0 08/02/2019    Lab Results  Component Value Date   CREATININE 0.96 12/05/2019   BUN 26 (H) 12/05/2019   NA 138 12/05/2019   K 4.1 12/05/2019   CL 101 12/05/2019   CO2 31 12/05/2019    Lab Results  Component Value Date   ALT 13 08/02/2019   AST 17 08/02/2019   ALKPHOS 88 08/02/2019   BILITOT 0.5 08/02/2019     Physical Exam: BP 120/68   Pulse 78   Ht 5' 3.25" (1.607 m)   Wt 134 lb 6.4 oz (61 kg)   SpO2 98%   BMI 23.62 kg/m  Constitutional: Pleasant,well-developed, female in no acute distress. Abdominal: Soft, nondistended, nontender.  There are no masses palpable. Neurological: Alert and oriented to person place and time. Psychiatric: Normal mood and affect. Behavior is normal.   ASSESSMENT AND PLAN: 75 year old female here for reassessment of the following:  Abdominal pain Constipation GERD  The patient is doing much better since have last seen her.  On a good bowel regimen of MiraLAX daily, which she titrate up as needed, as well as IBgard twice daily with titration up as needed.  With breakthrough she takes Levsin although that is been very rare.  She has clear benefit with this regimen.  I reassured her of her prior work-up which did not show anything too concerning.  Sounds like when she changes her diet to manage her diabetes, specifically increasing protein, that can make her more  constipated.  We discussed options to prevent this.  She can try increasing MiraLAX to twice daily when she is doing this to reduce the risk of having recurrence.  She can also try using Dulcolax as needed as opposed to using Colace which may be more effective to stimulate a bowel movement quickly.  She will otherwise continue her regimen of IBgard twice daily and using the Levsin as needed.  We had discussed doing a colonoscopy if her symptoms persisted to rule out anastomotic stricture, I do not think we need to pursue that right now.  She is agreeable with that.  We have otherwise discussed her reflux regimen.  She wishes to continue this, working well for her, understands risks and benefits of chronic PPIs.  She is declining TIF.  She can follow-up as needed for these issues, she agrees with the plan  [place] Cellar, MD [facility]

## 2020-04-01 NOTE — Patient Instructions (Signed)
If you are age 75 or older, your body mass index should be between 23-30. Your Body mass index is 23.62 kg/m. If this is out of the aforementioned range listed, please consider follow up with your Primary Care Provider.  If you are age 51 or younger, your body mass index should be between 19-25. Your Body mass index is 23.62 kg/m. If this is out of the aformentioned range listed, please consider follow up with your Primary Care Provider.   Continue Miralax once daily. Titrate as needed Continue IBgard, Levsin, Protonix and Pepcid.  Discontinue colace and substitute dulcolax as needed.  Follow up as needed.  Thank you for entrusting me with your care and for choosing [facility], Dr. [place] Cellar

## 2020-04-05 DIAGNOSIS — E7800 Pure hypercholesterolemia, unspecified: Secondary | ICD-10-CM | POA: Diagnosis not present

## 2020-04-05 DIAGNOSIS — E1065 Type 1 diabetes mellitus with hyperglycemia: Secondary | ICD-10-CM | POA: Diagnosis not present

## 2020-04-05 DIAGNOSIS — E039 Hypothyroidism, unspecified: Secondary | ICD-10-CM | POA: Diagnosis not present

## 2020-04-22 DIAGNOSIS — E039 Hypothyroidism, unspecified: Secondary | ICD-10-CM | POA: Diagnosis not present

## 2020-04-22 DIAGNOSIS — G609 Hereditary and idiopathic neuropathy, unspecified: Secondary | ICD-10-CM | POA: Diagnosis not present

## 2020-04-22 DIAGNOSIS — E049 Nontoxic goiter, unspecified: Secondary | ICD-10-CM | POA: Diagnosis not present

## 2020-04-22 DIAGNOSIS — Z794 Long term (current) use of insulin: Secondary | ICD-10-CM | POA: Diagnosis not present

## 2020-04-22 DIAGNOSIS — E1065 Type 1 diabetes mellitus with hyperglycemia: Secondary | ICD-10-CM | POA: Diagnosis not present

## 2020-04-22 DIAGNOSIS — N189 Chronic kidney disease, unspecified: Secondary | ICD-10-CM | POA: Diagnosis not present

## 2020-04-22 DIAGNOSIS — E7800 Pure hypercholesterolemia, unspecified: Secondary | ICD-10-CM | POA: Diagnosis not present

## 2020-04-22 LAB — HEMOGLOBIN A1C: Hemoglobin A1C: 7.1

## 2020-04-29 ENCOUNTER — Other Ambulatory Visit: Payer: Self-pay | Admitting: Family Medicine

## 2020-04-30 NOTE — Telephone Encounter (Signed)
I will refill, but she is overdue for a follow up appt.  She needs to schedule so we can continue to fill medications.

## 2020-04-30 NOTE — Telephone Encounter (Signed)
Requesting:Xanax 0.5mg  Contract: UDS: Last Visit:07/23/19 Next Visit:n/a Last Refill:03/04/20 30 tabs 1 refill  Please Advise

## 2020-05-09 ENCOUNTER — Other Ambulatory Visit: Payer: Self-pay | Admitting: Nurse Practitioner

## 2020-05-13 DIAGNOSIS — B078 Other viral warts: Secondary | ICD-10-CM | POA: Diagnosis not present

## 2020-05-13 DIAGNOSIS — D225 Melanocytic nevi of trunk: Secondary | ICD-10-CM | POA: Diagnosis not present

## 2020-05-13 DIAGNOSIS — L578 Other skin changes due to chronic exposure to nonionizing radiation: Secondary | ICD-10-CM | POA: Diagnosis not present

## 2020-05-13 DIAGNOSIS — D1801 Hemangioma of skin and subcutaneous tissue: Secondary | ICD-10-CM | POA: Diagnosis not present

## 2020-05-13 DIAGNOSIS — D2272 Melanocytic nevi of left lower limb, including hip: Secondary | ICD-10-CM | POA: Diagnosis not present

## 2020-05-13 DIAGNOSIS — Z85820 Personal history of malignant melanoma of skin: Secondary | ICD-10-CM | POA: Diagnosis not present

## 2020-05-14 ENCOUNTER — Other Ambulatory Visit: Payer: Self-pay | Admitting: Family Medicine

## 2020-06-03 ENCOUNTER — Other Ambulatory Visit: Payer: Self-pay

## 2020-06-03 ENCOUNTER — Emergency Department (HOSPITAL_BASED_OUTPATIENT_CLINIC_OR_DEPARTMENT_OTHER)
Admission: EM | Admit: 2020-06-03 | Discharge: 2020-06-03 | Disposition: A | Discharge status: Home / Self Care | Payer: Medicare Other | Attending: Emergency Medicine | Admitting: Emergency Medicine

## 2020-06-03 ENCOUNTER — Emergency Department (HOSPITAL_BASED_OUTPATIENT_CLINIC_OR_DEPARTMENT_OTHER): Payer: Medicare Other

## 2020-06-03 ENCOUNTER — Encounter (HOSPITAL_BASED_OUTPATIENT_CLINIC_OR_DEPARTMENT_OTHER): Payer: Self-pay | Admitting: Emergency Medicine

## 2020-06-03 DIAGNOSIS — K5641 Fecal impaction: Secondary | ICD-10-CM

## 2020-06-03 DIAGNOSIS — Z79899 Other long term (current) drug therapy: Secondary | ICD-10-CM | POA: Insufficient documentation

## 2020-06-03 DIAGNOSIS — E119 Type 2 diabetes mellitus without complications: Secondary | ICD-10-CM | POA: Diagnosis not present

## 2020-06-03 DIAGNOSIS — K5900 Constipation, unspecified: Secondary | ICD-10-CM | POA: Insufficient documentation

## 2020-06-03 DIAGNOSIS — E039 Hypothyroidism, unspecified: Secondary | ICD-10-CM | POA: Insufficient documentation

## 2020-06-03 DIAGNOSIS — Z85828 Personal history of other malignant neoplasm of skin: Secondary | ICD-10-CM | POA: Diagnosis not present

## 2020-06-03 DIAGNOSIS — Z87891 Personal history of nicotine dependence: Secondary | ICD-10-CM | POA: Diagnosis not present

## 2020-06-03 DIAGNOSIS — Z794 Long term (current) use of insulin: Secondary | ICD-10-CM | POA: Diagnosis not present

## 2020-06-03 LAB — URINALYSIS, ROUTINE W REFLEX MICROSCOPIC
Bilirubin Urine: NEGATIVE
Glucose, UA: NEGATIVE mg/dL
Hgb urine dipstick: NEGATIVE
Ketones, ur: NEGATIVE mg/dL
Leukocytes,Ua: NEGATIVE
Nitrite: NEGATIVE
Protein, ur: NEGATIVE mg/dL
Specific Gravity, Urine: 1.006 (ref 1.005–1.030)
pH: 7 (ref 5.0–8.0)

## 2020-06-03 LAB — BASIC METABOLIC PANEL
Anion gap: 8 (ref 5–15)
BUN: 23 mg/dL (ref 8–23)
CO2: 30 mmol/L (ref 22–32)
Calcium: 9 mg/dL (ref 8.9–10.3)
Chloride: 102 mmol/L (ref 98–111)
Creatinine, Ser: 0.89 mg/dL (ref 0.44–1.00)
GFR, Estimated: >60 mL/min (ref >60)
Glucose, Bld: 154 mg/dL — ABNORMAL HIGH (ref 70–99)
Potassium: 4.3 mmol/L (ref 3.5–5.1)
Sodium: 140 mmol/L (ref 135–145)

## 2020-06-03 LAB — CBC WITH DIFFERENTIAL/PLATELET
Abs Immature Granulocytes: 0.01 10*3/uL (ref 0.00–0.07)
Basophils Absolute: 0 10*3/uL (ref 0.0–0.1)
Basophils Relative: 1 %
Eosinophils Absolute: 0.1 10*3/uL (ref 0.0–0.5)
Eosinophils Relative: 1 %
HCT: 41.4 % (ref 36.0–46.0)
Hemoglobin: 13.6 g/dL (ref 12.0–15.0)
Immature Granulocytes: 0 %
Lymphocytes Relative: 18 %
Lymphs Abs: 1.1 10*3/uL (ref 0.7–4.0)
MCH: 30.7 pg (ref 26.0–34.0)
MCHC: 32.9 g/dL (ref 30.0–36.0)
MCV: 93.5 fL (ref 80.0–100.0)
Monocytes Absolute: 0.5 10*3/uL (ref 0.1–1.0)
Monocytes Relative: 9 %
Neutro Abs: 4.3 10*3/uL (ref 1.7–7.7)
Neutrophils Relative %: 71 %
Platelets: 213 10*3/uL (ref 150–400)
RBC: 4.43 MIL/uL (ref 3.87–5.11)
RDW: 12.8 % (ref 11.5–15.5)
WBC: 6 10*3/uL (ref 4.0–10.5)
nRBC: 0 % (ref 0.0–0.2)

## 2020-06-03 LAB — MAGNESIUM: Magnesium: 2.2 mg/dL (ref 1.7–2.4)

## 2020-06-03 MED ORDER — IOHEXOL 9 MG/ML PO SOLN: 500.0000 mL | ORAL | Status: AC

## 2020-06-03 MED ORDER — SENNOSIDES-DOCUSATE SODIUM 8.6-50 MG PO TABS: 1.0000 | ORAL_TABLET | Freq: Every day | ORAL | 0 refills | Status: AC

## 2020-06-03 MED ORDER — BISACODYL 5 MG PO TBEC: 5.0000 mg | DELAYED_RELEASE_TABLET | Freq: Every day | ORAL | 0 refills | Status: DC | PRN

## 2020-06-03 MED ORDER — FENTANYL CITRATE (PF) 100 MCG/2ML IJ SOLN
50.0000 ug | Freq: Once | INTRAMUSCULAR | Status: AC | PRN
  Administered 2020-06-03: 50 ug via INTRAVENOUS
  Filled 2020-06-03: qty 2

## 2020-06-03 MED ORDER — LIDOCAINE HCL URETHRAL/MUCOSAL 2 % EX GEL
1.0000 "application " | Freq: Once | CUTANEOUS | Status: AC
  Administered 2020-06-03: 1
  Filled 2020-06-03: qty 11

## 2020-06-03 MED ORDER — FLEET ENEMA 7-19 GM/118ML RE ENEM
1.0000 | ENEMA | Freq: Once | RECTAL | Status: DC
  Filled 2020-06-03: qty 1

## 2020-06-03 MED ORDER — ONDANSETRON HCL 4 MG/2ML IJ SOLN: 4.0000 mg | Freq: Once | INTRAMUSCULAR | Status: DC

## 2020-06-03 MED ORDER — FLEET ENEMA 7-19 GM/118ML RE ENEM
1.0000 | ENEMA | Freq: Once | RECTAL | Status: AC
  Administered 2020-06-03: 1 via RECTAL
  Filled 2020-06-03: qty 1

## 2020-06-03 MED ORDER — BISACODYL 10 MG RE SUPP: 10.0000 mg | Freq: Every day | RECTAL | 0 refills | Status: AC | PRN

## 2020-06-03 NOTE — Discharge Instructions (Addendum)
Instructions:  For your constipation, I want you to stick to a soft/light diet for the next 2 days.  You can drink water, soup, juice, broths, and soft foods like pure.  Avoid heavy and starchy food like meat, bread.    For your constipation begin taking Peri-Colace every day for the next 30 days.  This is a gentle stool softener and mild laxative.  Drink lots of water.  Beginning tomorrow, if you have not had another large or regular bowel movement, you can also take a Dulcolax tablet by mouth AND by rectum (suppository) at the same time.  This is a stronger laxative that should help you poop.  You can do this once per day for up to 3 days.    Please call your GI doctor's office tomorrow.  They should be aware of your ER visit, and may have other recommendations for your bowel health.  *  Please be aware that you were seen during a time of global shortage of iodinated contrast media. This means an alternative approach to your diagnosis and treatment may have been employed in order to provide optimal care during this shortage. If you have any worsening symptoms, please go to the nearest Emergency Department or call 911 immediately.

## 2020-06-03 NOTE — ED Provider Notes (Signed)
[facility] EMERGENC[facility] DEPT Provider Note   CSN: [id number] Arrival date & time: 06/03/20  1093     History Chief Complaint  Patient presents with  . Constipation    [name] [name] is a 75 y.o. female with history of cholecystectomy, chronic constipation, hemicolectomy, diabetes, presented emergency department secondary to constipation.  She reports her last bowel movement was 4 days ago.  She sees Dr Gerald Leitz from GI.  She says normally she has a bowel movement daily, and is on a regimen of MiraLAX daily, as well as IBGard.  However he has not had a bowel movement in 4 days.  She reports diffuse cramping abdominal pain.  She denies nausea or vomiting but has a poor appetite.  She is unsure about flatus.  She does report feeling some urinary frequency, but thinks may be pressure from stool.  She denies any known history of small bowel obstruction.  She denies any history of needing disimpaction.  She is here with her husband at the bedside.  She did additionally take Colace at home.  She has not done an enema.  HPI     Past Medical History:  Diagnosis Date  . Abdominal pain    EGD and CTs neg, rx Amitriptiline (non ulcer dyspepsia) EGD 12/11.. s/p ballon dilitation  . Anxiety   . Cataract   . Cervical spondylosis without myelopathy 11/30/2012  . Chronic conjunctivitis   . Diabetes mellitus    no meds, diet controlled  . Diverticulosis of colon   . GERD with stricture    s/p dilatations  . History of Lyme disease   . HOH (hard of hearing)    bilateral hearing aids  . Hypercholesterolemia   . Hyperlipidemia   . Hypothyroidism     Dx 10-9 TSH 5.2 , then TSH normalized, eventually went up again 2012  . Melanoma ([place])    h/o  . Memory difficulties 06/05/2014  . Osteopenia   . Osteoporosis   . Solar lentigo    face; s/p excision 3/09. See Derm q 6 months  . Trigeminal neuralgia    h/o     Patient Active Problem List   Diagnosis Date Noted  . Ingrown  toenail without infection 04/11/2015  . Memory difficulties 06/05/2014  . Abdominal pain, chronic, epigastric 01/29/2014  . Dysphagia 01/29/2014  . History of esophageal stricture 01/29/2014  . Chronic conjunctivitis 02/07/2013  . Cervical spondylosis without myelopathy 11/30/2012  . LUQ abdominal pain 04/13/2012  . Annual physical exam 03/18/2011  . Headache 02/02/2011  . NEURALGIA, TRIGEMINAL-- on Neurontin, Elavil, propranolol 03/13/2009  . Hypothyroidism 11/07/2007  . Type 2 diabetes mellitus with diabetic polyneuropathy ([place]) 02/14/2007  . Anxiety-insomnia-UDS 02/14/2007  . GERD 10/23/2006  . Hx of melanoma of skin 05/10/2006  . Hyperlipidemia associated with type 2 diabetes mellitus ([place]) 05/10/2006  . Diverticulitis of colon 05/10/2006  . Osteopenia 05/10/2006  . ESOPHAGEAL STRICTURE 02/16/2005    Past Surgical History:  Procedure Laterality Date  . ABDOMINAL HYSTERECTOMY    . ANKLE SURGERY  07/01/2015   R andkle tendon surgery  . BREAST BIOPSY Left 1988  . BREAST EXCISIONAL BIOPSY Left 1988  . CERVICAL FUSION  06/2017  . CHOLECYSTECTOMY    . COLON RESECTION  1999   d/t diverticulitis  . COLON SURGERY    . COLONOSCOPY    . ESOPHAGEAL MANOMETRY N/A 03/17/2019   Procedure: ESOPHAGEAL MANOMETRY (EM)  NO PH;  Surgeon: Lavena Bullion, DO;  Location: [facility];  Service:  Gastroenterology;  Laterality: N/A;  . LUMBAR [place] SURGERY  07/25/18, 09/20  . melanoma resection Right     Right Arm Surgery, R facial surgery (05-2012)   . PTX s/t chest tube    . SHOULDER SURGERY     right  . UPPER GASTROINTESTINAL ENDOSCOPY    . WRIST SURGERY     removed melaoma, right     OB History   No obstetric history on file.     Family History  Problem Relation Age of Onset  . Prostate cancer Father   . Endometrial cancer Sister        clear cell carcinoma  . Stroke Sister   . Heart attack Paternal Grandfather        GF in his 46s  . Melanoma Maternal Uncle   . Colon  cancer Neg Hx   . Breast cancer Neg Hx   . Diabetes Neg Hx   . Stomach cancer Neg Hx   . Rectal cancer Neg Hx   . Esophageal cancer Neg Hx     Social History   Tobacco Use  . Smoking status: Former Smoker    Types: Cigarettes    Start date: 01/20/1976    Quit date: 1981    Years since quitting: 41.3  . Smokeless tobacco: Never Used  Vaping Use  . Vaping Use: Never used  Substance Use Topics  . Alcohol use: Not Currently  . Drug use: No    Home Medications Prior to Admission medications   Medication Sig Start Date End Date Taking? Authorizing Provider  bisacodyl (DULCOLAX) 10 MG suppository Place 1 suppository (10 mg total) rectally daily as needed for up to 10 days for severe constipation. 06/03/20 06/13/20 Yes Krystalynn Ridgeway, Carola Rhine, MD  bisacodyl (DULCOLAX) 5 MG EC tablet Take 1 tablet (5 mg total) by mouth daily as needed for up to 10 doses for severe constipation. 06/03/20  Yes Nathyn Luiz, Carola Rhine, MD  senna-docusate (SENOKOT-S) 8.6-50 MG tablet Take 1 tablet by mouth daily for 30 doses. 06/03/20 07/03/20 Yes Brendt Dible, Carola Rhine, MD  ALPRAZolam Duanne Moron) 0.5 MG tablet TAKE 1 TABLET(0.5 MG) BY MOUTH AT BEDTIME AS NEEDED FOR SLEEP 04/30/20   Midge Minium, MD  amitriptyline (ELAVIL) 25 MG tablet Take 1 tablet (25 mg total) by mouth at bedtime. 11/15/19   Lomax, Amy, NP  baclofen (LIORESAL) 10 MG tablet Take 10 mg by mouth 2 (two) times daily. May take TID is needed    [provider]  BD PEN NEEDLE NANO 2ND GEN 32G X 4 MM MISC daily. as directed 01/23/19   [provider]  Cholecalciferol (VITAMIN D3) 2000 UNITS TABS Take 1 tablet by mouth daily.    [provider]  Continuous Blood Gluc Receiver (FREESTYLE LIBRE READER) DEVI by Does not apply route.    [provider]  diclofenac Sodium (VOLTAREN) 1 % GEL Apply 2 g topically as needed.    [provider]  famotidine (PEPCID) 20 MG tablet Take 1 tablet (20 mg total) by mouth at bedtime. 05/09/20    Noralyn Pick, NP  gabapentin (NEURONTIN) 300 MG capsule TAKE TWO CAPSULES IN THE MORNING AND 2 CAPSULES AT BEDTIME 11/15/19   Lomax, Amy, NP  hyoscyamine (LEVSIN SL) 0.125 MG SL tablet Take 0.125 mg by mouth as needed. 01/04/20   Armbruster, Carlota Raspberry, MD  Insulin Aspart (NOVOLOG FLEXPEN [place]) Inject into the skin. Per sliding scale each meal    [provider]  insulin degludec (  TRESIBA FLEXTOUCH) 100 UNIT/ML FlexTouch Pen Inject 4 Units into the skin at bedtime.    [provider]  levothyroxine (SYNTHROID, LEVOTHROID) 50 MCG tablet Take 50 mcg by mouth daily before breakfast. Take 60mcg M-F and 172mcg on Sat and Sun 03/02/16   [provider]  Lifitegrast 5 % SOLN Apply 1 each to eye as needed.    [provider]  Melatonin 5 MG TABS Take 1 tablet by mouth at bedtime.    [provider]  Multiple Vitamin (MULTIVITAMIN) tablet Take 1 tablet by mouth daily.    [provider]  pantoprazole (PROTONIX) 40 MG tablet TAKE 1 TABLET BY MOUTH EVERY DAY 05/14/20   Midge Minium, MD  Peppermint Oil (IBGARD) 90 MG CPCR Take as directed as needed 04/01/20   Armbruster, Carlota Raspberry, MD  polyethylene glycol (MIRALAX / GLYCOLAX) 17 g packet Take 34 g by mouth daily.    [provider]  Probiotic Product (PROBIOTIC PO) Take 1 tablet by mouth daily.    [provider]  rosuvastatin (CRESTOR) 10 MG tablet TAKE 1 TABLET(10 MG) BY MOUTH DAILY 02/15/20   Midge Minium, MD    Allergies    Rocephin [ceftriaxone], Prednisone, Other, Tape, and Benadryl [diphenhydramine hcl]  Review of Systems   Review of Systems  Constitutional: Positive for appetite change. Negative for chills and fever.  HENT: Negative for ear pain and sore throat.   Eyes: Negative for pain and visual disturbance.  Respiratory: Negative for cough and shortness of breath.   Cardiovascular: Negative for chest pain and palpitations.  Gastrointestinal: Positive  for constipation and rectal pain. Negative for abdominal pain and vomiting.  Genitourinary: Positive for frequency. Negative for dysuria.  Musculoskeletal: Negative for arthralgias and back pain.  Skin: Negative for color change and rash.  Neurological: Negative for syncope and headaches.  All other systems reviewed and are negative.   Physical Exam Updated Vital Signs BP (!) 151/79   Pulse 78   Temp 98.3 F (36.8 C) (Oral)   Resp 14   Ht 5\' 4"  (1.626 m)   Wt 59.4 kg   SpO2 99%   BMI 22.49 kg/m   Physical Exam Constitutional:      General: [name]. HENT:     Head: Normocephalic and atraumatic.  Eyes:     Conjunctiva/sclera: Conjunctivae normal.     Pupils: Pupils are equal, round, and reactive to light.  Cardiovascular:     Rate and Rhythm: Normal rate and regular rhythm.  Pulmonary:     Effort: Pulmonary effort is normal. No respiratory distress.  Abdominal:     General: There is no distension.     Tenderness: There is generalized abdominal tenderness. There is no guarding or rebound. Negative signs include Murphy's sign.  Genitourinary:    Comments: Rectal exam performed with husband present in room No external hemorrhoids, no anal fissure Soft stool palpable in rectal vault Skin:    General: Skin is warm and dry.  Neurological:     General: No focal deficit present.     Mental Status: She is alert and oriented to person, place, and time. Mental status is at baseline.  Psychiatric:        Mood and Affect: Mood normal.        Behavior: Behavior normal.     ED Results / Procedures / Treatments   Labs (all labs ordered are listed, but only abnormal results are displayed) Labs Reviewed  BASIC METABOLIC PANEL - Abnormal; Notable for the following components:      Result Value   Glucose, Bld 154 (*)    All other components within normal limits  URINALYSIS, ROUTINE W REFLEX MICROSCOPIC - Abnormal; Notable for the following components:    Color, Urine COLORLESS (*)    All other components within normal limits  CBC WITH DIFFERENTIAL/PLATELET  MAGNESIUM    EKG None  Radiology CT ABDOMEN PELVIS WO CONTRAST  Result Date: 06/03/2020 CLINICAL DATA:  Constipation EXAM: CT ABDOMEN AND PELVIS WITHOUT CONTRAST TECHNIQUE: Multidetector CT imaging of the abdomen and pelvis was performed following the standard protocol without IV contrast. COMPARISON:  None. FINDINGS: Lower chest: Linear scarring at the right lung base. No acute abnormality. Hepatobiliary: No focal liver abnormality is seen. Status post cholecystectomy. No biliary dilatation. Pancreas: No focal abnormality or ductal dilatation. Spleen: No focal abnormality.  Normal size. Adrenals/Urinary Tract: No adrenal abnormality. No focal renal abnormality. No stones or hydronephrosis. Urinary bladder is unremarkable. Stomach/Bowel: Postoperative changes in the sigmoid colon. Moderate stool burden throughout the colon, most pronounced in the rectosigmoid colon. No evidence of bowel obstruction. Stomach and small bowel grossly unremarkable. Vascular/Lymphatic: Heavily calcified aorta. No evidence of aneurysm or adenopathy. Reproductive: Prior hysterectomy.  No adnexal masses. Other: No free fluid or free air. Musculoskeletal: No acute bony abnormality. Postoperative 1 changes in the lumbar spine. IMPRESSION: Moderate stool burden throughout the colon, most pronounced in the rectosigmoid colon. Postoperative changes in the sigmoid colon. No evidence of bowel obstruction. Aortic atherosclerosis. Electronically Signed   By: Rolm Baptise M.D.   On: 06/03/2020 11:19    Procedures Fecal disimpaction  Date/Time: 06/03/2020 3:51 PM Performed by: Wyvonnia Dusky, MD Authorized by: Wyvonnia Dusky, MD  Consent: Verbal consent obtained. Written consent not obtained. Risks and benefits: risks, benefits and alternatives were discussed Consent given by: patient Patient understanding: patient  states understanding of the procedure being performed Patient consent: the patient's understanding of the procedure matches consent given Procedure consent: procedure consent matches procedure scheduled Relevant documents: relevant documents present and verified Test results: test results available and properly labeled Site marked: the operative site was marked Imaging studies: imaging studies available Required items: required blood products, implants, devices, and special equipment available Patient identity confirmed: verbally with patient and arm band Time out: Immediately prior to procedure a "time out" was called to verify the correct patient, procedure, equipment, support staff and site/side marked as required. Preparation: Patient was prepped and draped in the usual sterile fashion. Local anesthesia used: no  Anesthesia: Local anesthesia used: no  Sedation: Patient sedated: yes Analgesia: fentanyl  Patient tolerance: patient tolerated the procedure well with no immediate complications      Medications Ordered in ED Medications  ondansetron (ZOFRAN) injection 4 mg (4 mg Intravenous Not Given 06/03/20 1016)  iohexol (OMNIPAQUE) 9 MG/ML oral solution 500 mL (has no administration in time range)  sodium phosphate (FLEET) 7-19 GM/118ML enema 1 enema (has no administration in time range)  sodium phosphate (FLEET) 7-19 GM/118ML enema 1 enema (1 enema Rectal Given 06/03/20 1139)  lidocaine (XYLOCAINE) 2 % jelly 1 application (1 application Other Given 06/03/20 1243)  fentaNYL (SUBLIMAZE) injection 50 mcg (50 mcg Intravenous Given 06/03/20 1237)    ED Course  I have reviewed the triage vital signs and the nursing notes.  Pertinent labs & imaging results that were available during my care of the patient were reviewed by me and considered in my medical decision  making (see chart for details).  This is a 75 year old female present emergency department cramping abdominal pain and  constipation for 4 days.  Differential includes fecal impaction versus chronic constipation versus bowel obstruction versus other.  Will need to check her electrolyte levels, Mg and K and Ca.  We will also check a hemoglobin and a white blood cell count.  Given her history of multiple bowel surgeries, she is high risk of bowel obstruction.  We will obtain a CT scan with p.o. contrast, as much as she can tolerate.  I've ordered some Zofran if she develops nausea.  I personally reviewed her prior medical records including her outpatient visit to the gastroenterology and March, and her current bowel regimen.  I also reviewed her prior colonoscopy in 2017 showing mild diverticulosis, benign polyps removed, no microscopic colitis.  This patient was evaluated during a time of global shortage of iodinated contrast media. Based on guidance from the [employer] of Radiology, best practices, and local institutional approaches an alternative path for evaluating and managing the patient may have been employed in order to provide optimal care during this shortage. The current situation has been discussed with the patient.  Labs reviewed showing no remarkable findings - electrolytes wnl, UA without sign of infection.  After the patient urinated, her bladder scan subsequently showed 150 cc of retained urine, therefore asked for an In-N-Out catheter, which produced 450 cc urine.  This may be related to her  constipation.  IV zofran ordered for nausea  CT scan reviewed showing large amount of stool in the colon and rectal vault, consistent with fecal impaction.  No evidence of perforation or SBO.  Patient was consented for fecal disimpaction, which was performed at the bedside.  Subsequently she was able to have a bowel movement, was discharged home.   Clinical Course as of 06/03/20 1552  Mon Jun 03, 2020  [id number] Potassium: 4.3 [MT]  0955 Magnesium: 2.2 [MT]  1122 Pt just urinated and provided sample, we  will obtain bladder scan to ensure she isn't retaining.  CT per my review showing likely impaction/stool ball in rectum.  We'll attempt an enema [MT]  1344 Performed disimpaction, could not extricate large amount of feces but able ot break up some stool with finger.  Stool is soft.  We'll retry enema [MT]  1509 Patient had a large bowel obstruction.  Likely passed source of impactation.  We'll discharge with prescription for enema [MT]    Clinical Course User Index [MT] Kylee Umana, Carola Rhine, MD    Final Clinical Impression(s) / ED Diagnoses Final diagnoses:  Fecal impaction [facility])    Rx / DC Orders ED Discharge Orders         Ordered    bisacodyl (DULCOLAX) 5 MG EC tablet  Daily PRN        06/03/20 1526    bisacodyl (DULCOLAX) 10 MG suppository  Daily PRN        06/03/20 1526    senna-docusate (SENOKOT-S) 8.6-50 MG tablet  Daily        06/03/20 1526           Wyvonnia Dusky, MD 06/03/20 1552

## 2020-06-03 NOTE — ED Triage Notes (Signed)
Constipation for several days. She normally takes miralax daily. She reports she has been using laxatives and stool softeners also without relief.

## 2020-06-03 NOTE — ED Notes (Signed)
Pt states she is feeling fine and having no trouble with oral contrast

## 2020-06-03 NOTE — ED Notes (Signed)
Patient transported to CT 

## 2020-06-21 ENCOUNTER — Ambulatory Visit (INDEPENDENT_AMBULATORY_CARE_PROVIDER_SITE_OTHER): Payer: Medicare Other | Admitting: Family Medicine

## 2020-06-21 ENCOUNTER — Encounter: Payer: Self-pay | Admitting: Family Medicine

## 2020-06-21 ENCOUNTER — Other Ambulatory Visit: Payer: Self-pay

## 2020-06-21 VITALS — BP 118/80 | HR 87 | Temp 98.1°F | Resp 17 | Ht 64.0 in | Wt 133.6 lb

## 2020-06-21 DIAGNOSIS — N6459 Other signs and symptoms in breast: Secondary | ICD-10-CM | POA: Diagnosis not present

## 2020-06-21 DIAGNOSIS — K219 Gastro-esophageal reflux disease without esophagitis: Secondary | ICD-10-CM | POA: Diagnosis not present

## 2020-06-21 DIAGNOSIS — E7800 Pure hypercholesterolemia, unspecified: Secondary | ICD-10-CM | POA: Insufficient documentation

## 2020-06-21 DIAGNOSIS — E1142 Type 2 diabetes mellitus with diabetic polyneuropathy: Secondary | ICD-10-CM | POA: Diagnosis not present

## 2020-06-21 DIAGNOSIS — Z794 Long term (current) use of insulin: Secondary | ICD-10-CM | POA: Insufficient documentation

## 2020-06-21 DIAGNOSIS — G609 Hereditary and idiopathic neuropathy, unspecified: Secondary | ICD-10-CM | POA: Insufficient documentation

## 2020-06-21 DIAGNOSIS — E1169 Type 2 diabetes mellitus with other specified complication: Secondary | ICD-10-CM | POA: Diagnosis not present

## 2020-06-21 DIAGNOSIS — F411 Generalized anxiety disorder: Secondary | ICD-10-CM

## 2020-06-21 DIAGNOSIS — E785 Hyperlipidemia, unspecified: Secondary | ICD-10-CM | POA: Diagnosis not present

## 2020-06-21 DIAGNOSIS — I700 Atherosclerosis of aorta: Secondary | ICD-10-CM

## 2020-06-21 DIAGNOSIS — E049 Nontoxic goiter, unspecified: Secondary | ICD-10-CM | POA: Insufficient documentation

## 2020-06-21 DIAGNOSIS — N189 Chronic kidney disease, unspecified: Secondary | ICD-10-CM | POA: Insufficient documentation

## 2020-06-21 DIAGNOSIS — E1065 Type 1 diabetes mellitus with hyperglycemia: Secondary | ICD-10-CM | POA: Insufficient documentation

## 2020-06-21 MED ORDER — ROSUVASTATIN CALCIUM 10 MG PO TABS: ORAL_TABLET | ORAL | 1 refills | Status: DC

## 2020-06-21 MED ORDER — ASPIRIN 81 MG PO TBEC: 81.0000 mg | DELAYED_RELEASE_TABLET | Freq: Every day | ORAL | 12 refills | Status: AC

## 2020-06-21 MED ORDER — ALPRAZOLAM 0.5 MG PO TABS: ORAL_TABLET | ORAL | 1 refills | Status: DC

## 2020-06-21 MED ORDER — PANTOPRAZOLE SODIUM 40 MG PO TBEC: 1.0000 | DELAYED_RELEASE_TABLET | Freq: Every day | ORAL | 1 refills | Status: DC

## 2020-06-21 NOTE — Assessment & Plan Note (Signed)
New.  Noted on CT scan done last month.  Already on statin.  Will start ASA 81mg  daily and refer to Cards in case additional w/u needed.

## 2020-06-21 NOTE — Assessment & Plan Note (Signed)
Currently well controlled.  Refill on Pantoprazole sent

## 2020-06-21 NOTE — Assessment & Plan Note (Signed)
Chronic problem.  Tolerating Crestor w/o difficulty.  Check labs.  Adjust meds prn  

## 2020-06-21 NOTE — Progress Notes (Signed)
   Subjective:    Patient ID: [name] [name], female    DOB: 07-22-1945, 75 y.o.   MRN: [id number]  HPI Hyperlipidemia- chronic problem, on Crestor 10mg  daily.  Lipid panel in March- total cholesterol 203, LDL 101, HDL 92.  Would like to repeat this today.  Denies CP, SOB, abd pain, N/V.  GERD- chronic problem, on Pantoprazole 40mg  daily.  sxs adequately controlled.  Inverted nipple- R breast.  Noted while showering in November.  No pain.  Last mammo was 4/21.  Denies skin changes.  Anxiety- pt needs refill on Alprazolam 0.5mg .  Doing well  Aortic Atherosclerosis- noted on CT scan done 5/16.  Currently on Crestor.  Not on ASA.   Review of Systems For ROS see HPI   This visit occurred during the SARS-CoV-2 public health emergency.  Safety protocols were in place, including screening questions prior to the visit, additional usage of staff PPE, and extensive cleaning of exam room while observing appropriate contact time as indicated for disinfecting solutions.       Objective:   Physical Exam Constitutional:      General: [name].    Appearance: [name].  HENT:     Head: Normocephalic and atraumatic.  Eyes:     Conjunctiva/sclera: Conjunctivae normal.     Pupils: Pupils are equal, round, and reactive to light.  Neck:     Thyroid: No thyromegaly.  Cardiovascular:     Rate and Rhythm: Normal rate and regular rhythm.     Heart sounds: Normal heart sounds. No murmur heard.   Pulmonary:     Effort: Pulmonary effort is normal. No respiratory distress.     Breath sounds: Normal breath sounds.  Chest:  Breasts:     Right: Inverted nipple present. No bleeding, mass, nipple discharge, skin change or tenderness.     Left: No bleeding, inverted nipple, mass, nipple discharge, skin change or tenderness.    Abdominal:     General: There is no distension.     Palpations: Abdomen is soft.     Tenderness: There is no abdominal tenderness.   Musculoskeletal:     Cervical back: Normal range of motion and neck supple.  Lymphadenopathy:     Cervical: No cervical adenopathy.  Skin:    General: Skin is warm and dry.  Neurological:     Mental Status: She is alert and oriented to person, place, and time.  Psychiatric:        Behavior: Behavior normal.           Assessment & Plan:  Inverted nipple- new.  Pt reports R nipple has been inverted since November.  No pain, drainage, redness, or overlying skin changes.  Will get diagnostic mammo to assess.  Pt expressed understanding and is in agreement w/ plan.

## 2020-06-21 NOTE — Assessment & Plan Note (Addendum)
Following w/ Dr Chalmers Cater.  Last A1C 7.1  Eye exam was done in January- chart updated

## 2020-06-21 NOTE — Patient Instructions (Signed)
Follow up in 6 months to recheck cholesterol We'll notify you of your lab results and make any changes if needed Continue to work on healthy diet and regular exercise- you're doing great! We'll call you with your mammogram appt and your cardiology Call with any questions or concerns Have a great summer!!

## 2020-06-21 NOTE — Assessment & Plan Note (Signed)
Ongoing issue.  Refill on Alprazolam sent.

## 2020-06-22 LAB — CBC WITH DIFFERENTIAL/PLATELET
Absolute Monocytes: 821 cells/uL (ref 200–950)
Basophils Absolute: 38 cells/uL (ref 0–200)
Basophils Relative: 0.5 %
Eosinophils Absolute: 68 cells/uL (ref 15–500)
Eosinophils Relative: 0.9 %
HCT: 39.9 % (ref 35.0–45.0)
Hemoglobin: 13.6 g/dL (ref 11.7–15.5)
Lymphs Abs: 1619 cells/uL (ref 850–3900)
MCH: 30.8 pg (ref 27.0–33.0)
MCHC: 34.1 g/dL (ref 32.0–36.0)
MCV: 90.3 fL (ref 80.0–100.0)
MPV: 11.7 fL (ref 7.5–12.5)
Monocytes Relative: 10.8 %
Neutro Abs: 5054 cells/uL (ref 1500–7800)
Neutrophils Relative %: 66.5 %
Platelets: 243 10*3/uL (ref 140–400)
RBC: 4.42 10*6/uL (ref 3.80–5.10)
RDW: 12.2 % (ref 11.0–15.0)
Total Lymphocyte: 21.3 %
WBC: 7.6 10*3/uL (ref 3.8–10.8)

## 2020-06-22 LAB — BASIC METABOLIC PANEL
BUN/Creatinine Ratio: 31 (calc) — ABNORMAL HIGH (ref 6–22)
BUN: 30 mg/dL — ABNORMAL HIGH (ref 7–25)
CO2: 25 mmol/L (ref 20–32)
Calcium: 10 mg/dL (ref 8.6–10.4)
Chloride: 104 mmol/L (ref 98–110)
Creat: 0.97 mg/dL — ABNORMAL HIGH (ref 0.60–0.93)
Glucose, Bld: 161 mg/dL — ABNORMAL HIGH (ref 65–99)
Potassium: 4.4 mmol/L (ref 3.5–5.3)
Sodium: 140 mmol/L (ref 135–146)

## 2020-06-22 LAB — HEPATIC FUNCTION PANEL
AG Ratio: 2 (calc) (ref 1.0–2.5)
ALT: 13 U/L (ref 6–29)
AST: 17 U/L (ref 10–35)
Albumin: 4.6 g/dL (ref 3.6–5.1)
Alkaline phosphatase (APISO): 75 U/L (ref 37–153)
Bilirubin, Direct: 0.1 mg/dL (ref 0.0–0.2)
Globulin: 2.3 g/dL (calc) (ref 1.9–3.7)
Indirect Bilirubin: 0.2 mg/dL (calc) (ref 0.2–1.2)
Total Bilirubin: 0.3 mg/dL (ref 0.2–1.2)
Total Protein: 6.9 g/dL (ref 6.1–8.1)

## 2020-06-22 LAB — LIPID PANEL
Cholesterol: 191 mg/dL (ref <200)
HDL: 83 mg/dL (ref >=50)
LDL Cholesterol (Calc): 82 mg/dL (calc)
Non-HDL Cholesterol (Calc): 108 mg/dL (calc) (ref <130)
Total CHOL/HDL Ratio: 2.3 (calc) (ref <5.0)
Triglycerides: 160 mg/dL — ABNORMAL HIGH (ref <150)

## 2020-06-27 ENCOUNTER — Telehealth: Payer: Self-pay | Admitting: Family Medicine

## 2020-06-27 NOTE — Telephone Encounter (Signed)
Patient would like to see Dr. Gardiner Rhyme at [facility]

## 2020-06-27 NOTE — Telephone Encounter (Signed)
Done

## 2020-07-02 ENCOUNTER — Other Ambulatory Visit: Payer: Self-pay | Admitting: Family Medicine

## 2020-07-02 DIAGNOSIS — N6459 Other signs and symptoms in breast: Secondary | ICD-10-CM

## 2020-07-17 ENCOUNTER — Encounter: Payer: Self-pay | Admitting: *Deleted

## 2020-07-23 DIAGNOSIS — E049 Nontoxic goiter, unspecified: Secondary | ICD-10-CM | POA: Diagnosis not present

## 2020-07-23 DIAGNOSIS — N189 Chronic kidney disease, unspecified: Secondary | ICD-10-CM | POA: Diagnosis not present

## 2020-07-23 DIAGNOSIS — E039 Hypothyroidism, unspecified: Secondary | ICD-10-CM | POA: Diagnosis not present

## 2020-07-23 DIAGNOSIS — E1065 Type 1 diabetes mellitus with hyperglycemia: Secondary | ICD-10-CM | POA: Diagnosis not present

## 2020-07-23 DIAGNOSIS — Z794 Long term (current) use of insulin: Secondary | ICD-10-CM | POA: Diagnosis not present

## 2020-07-23 DIAGNOSIS — Z9641 Presence of insulin pump (external) (internal): Secondary | ICD-10-CM | POA: Diagnosis not present

## 2020-07-23 DIAGNOSIS — G609 Hereditary and idiopathic neuropathy, unspecified: Secondary | ICD-10-CM | POA: Diagnosis not present

## 2020-08-13 ENCOUNTER — Ambulatory Visit
Admission: RE | Admit: 2020-08-13 | Discharge: 2020-08-13 | Disposition: A | Discharge status: Home / Self Care | Payer: Medicare Other | Source: Ambulatory Visit | Attending: Family Medicine | Admitting: Family Medicine

## 2020-08-13 ENCOUNTER — Other Ambulatory Visit: Payer: Self-pay

## 2020-08-13 DIAGNOSIS — N6459 Other signs and symptoms in breast: Secondary | ICD-10-CM

## 2020-08-13 DIAGNOSIS — R922 Inconclusive mammogram: Secondary | ICD-10-CM | POA: Diagnosis not present

## 2020-08-25 NOTE — Progress Notes (Signed)
Cardiology Office Note:    Date:  08/28/2020   ID:  TANETTE CHAUCA, DOB 12/17/1945, MRN [id number]  PCP:  Midge Minium, MD  Cardiologist:  None  Electrophysiologist:  None   Referring MD: Midge Minium, MD   Chief Complaint  Patient presents with   Chest Pain    History of Present Illness:    [name] [name] is a 75 y.o. female with a hx of T2DM, hyperlipidemia, hypothyroidism, melanoma who is referred by Dr. Birdie Riddle for evaluation of aortic atherosclerosis.  Noted on CT abdomen pelvis 06/03/2020.  She reports that she does water aerobics 3 days/week, reports she has been developing shortness of breath and lightheadedness with exertion.  She denies any chest pain during these episodes.  However has been having episodes at night where she feels like her chest and arms are throbbing, lasts for 5 to 30 minutes.  Occurs most nights.  She denies any syncope or lower extremity edema.  Denies any palpitations.  She quit smoking at age 44.  No known history of heart disease in her immediate family.    Past Medical History:  Diagnosis Date   Abdominal pain    EGD and CTs neg, rx Amitriptiline (non ulcer dyspepsia) EGD 12/11.. s/p ballon dilitation   Anxiety    Cataract    Cervical spondylosis without myelopathy 11/30/2012   Chronic conjunctivitis    Diabetes mellitus    no meds, diet controlled   Diverticulosis of colon    GERD with stricture    s/p dilatations   History of Lyme disease    HOH (hard of hearing)    bilateral hearing aids   Hypercholesterolemia    Hyperlipidemia    Hypothyroidism     Dx 10-9 TSH 5.2 , then TSH normalized, eventually went up again 2012   Melanoma [facility])    h/o   Memory difficulties 06/05/2014   Osteopenia    Osteoporosis    Solar lentigo    face; s/p excision 3/09. See Derm q 6 months   Trigeminal neuralgia    h/o     Past Surgical History:  Procedure Laterality Date   ABDOMINAL HYSTERECTOMY     ANKLE SURGERY  07/01/2015   R  andkle tendon surgery   BREAST BIOPSY Left 1988   BREAST EXCISIONAL BIOPSY Left 1988   CERVICAL FUSION  06/2017   CHOLECYSTECTOMY     COLON RESECTION  1999   d/t diverticulitis   COLON SURGERY     COLONOSCOPY     ESOPHAGEAL MANOMETRY N/A 03/17/2019   Procedure: ESOPHAGEAL MANOMETRY (EM)  NO PH;  Surgeon: Lavena Bullion, DO;  Location: [facility];  Service: Gastroenterology;  Laterality: N/A;   LUMBAR DISC SURGERY  07/25/18, 09/20   melanoma resection Right     Right Arm Surgery, R facial surgery (05-2012)      PTX s/t chest tube     SHOULDER SURGERY     right   UPPER GASTROINTESTINAL ENDOSCOPY     WRIST SURGERY     removed melaoma, right    Current Medications: Current Meds  Medication Sig   ALPRAZolam (XANAX) 0.5 MG tablet TAKE 1 TABLET(0.5 MG) BY MOUTH AT BEDTIME AS NEEDED FOR SLEEP   amitriptyline (ELAVIL) 25 MG tablet Take 1 tablet (25 mg total) by mouth at bedtime.   aspirin 81 MG EC tablet Take 1 tablet (81 mg total) by mouth daily. Swallow whole.   baclofen (LIORESAL) 10 MG tablet Take 10 mg  by mouth 2 (two) times daily. May take TID is needed   Cholecalciferol (VITAMIN D3) 2000 UNITS TABS Take 1 tablet by mouth daily.   Continuous Blood Gluc Receiver (FREESTYLE LIBRE READER) DEVI by Does not apply route.   diclofenac Sodium (VOLTAREN) 1 % GEL Apply 2 g topically as needed.   famotidine (PEPCID) 20 MG tablet Take 1 tablet (20 mg total) by mouth at bedtime.   gabapentin (NEURONTIN) 300 MG capsule TAKE TWO CAPSULES IN THE MORNING AND 2 CAPSULES AT BEDTIME   hyoscyamine (LEVSIN SL) 0.125 MG SL tablet Take 0.125 mg by mouth as needed.   Insulin Disposable Pump (OMNIPOD DASH INTRO, GEN 4,) KIT by Does not apply route. Change q 3 days   levothyroxine (SYNTHROID, LEVOTHROID) 50 MCG tablet Take 50 mcg by mouth daily before breakfast. Take 26mg M-F and 1061m on Sat and Sun   Lifitegrast 5 % SOLN Apply 1 each to eye as needed.   Melatonin 5 MG TABS Take 1 tablet by mouth at  bedtime.   Multiple Vitamin (MULTIVITAMIN) tablet Take 1 tablet by mouth daily.   pantoprazole (PROTONIX) 40 MG tablet Take 1 tablet (40 mg total) by mouth daily.   Peppermint Oil (IBGARD) 90 MG CPCR Take as directed as needed   polyethylene glycol (MIRALAX / GLYCOLAX) 17 g packet Take 34 g by mouth daily.   Probiotic Product (PROBIOTIC PO) Take 1 tablet by mouth daily.   rosuvastatin (CRESTOR) 10 MG tablet TAKE 1 TABLET(10 MG) BY MOUTH DAILY     Allergies:   Rocephin [ceftriaxone], Prednisone, Ibandronic acid, Other, Tape, Tramadol hcl, Benadryl [diphenhydramine hcl], Repaglinide, and Sitagliptin   Social History   Socioeconomic History   Marital status: Married    Spouse name: EdPercell Miller Number of children: 2   Years of education: college   Highest education level: Not on file  Occupational History   Occupation: retired    EmFish farm managerRETIRED  Tobacco Use   Smoking status: Former    Types: Cigarettes    Start date: 01/20/1976    Quit date: 1981    Years since quitting: 41.6   Smokeless tobacco: Never  Vaping Use   Vaping Use: Never used  Substance and Sexual Activity   Alcohol use: Not Currently   Drug use: No   Sexual activity: Not Currently  Other Topics Concern   Not on file  Social History Narrative   Patient is married with 2 children.   Patient is right handed.   Patient has college education.   Patient does not drink caffeine.          Social Determinants of Health   Financial Resource Strain: Low Risk    Difficulty of Paying Living Expenses: Not hard at all  Food Insecurity: No Food Insecurity   Worried About RuCharity fundraisern the Last Year: Never true   RaDauphinn the Last Year: Never true  Transportation Needs: No Transportation Needs   Lack of Transportation (Medical): No   Lack of Transportation (Non-Medical): No  Physical Activity: Sufficiently Active   Days of Exercise per Week: 3 days   Minutes of Exercise per Session: 50 min  Stress:  No Stress Concern Present   Feeling of Stress : Only a little  Social Connections: SoEngineer, building servicesf Communication with Friends and Family: More than three times a week   Frequency of Social Gatherings with Friends and Family: More than three times a week  Attends Religious Services: More than 4 times per year   Active Member of Clubs or Organizations: Yes   Attends Archivist Meetings: More than 4 times per year   Marital Status: Married     Family History: The patient'sfamily history includes Endometrial cancer in her sister; Heart attack in her paternal grandfather; Melanoma in her maternal uncle; Prostate cancer in her father; Stroke in her sister. There is no history of Colon cancer, Breast cancer, Diabetes, Stomach cancer, Rectal cancer, or Esophageal cancer.  ROS:   Please see the history of present illness.     All other systems reviewed and are negative.  EKGs/Labs/Other Studies Reviewed:    The following studies were reviewed today:   EKG:  EKG is ordered today.  The ekg ordered today demonstrates NSR, rate 86, biatrial enlargement  Recent Labs: 06/03/2020: Magnesium 2.2 06/21/2020: ALT 13; BUN 30; Creat 0.97; Hemoglobin 13.6; Platelets 243; Potassium 4.4; Sodium 140  Recent Lipid Panel    Component Value Date/Time   CHOL 191 06/21/2020 1508   TRIG 160 (H) 06/21/2020 1508   TRIG 37 11/11/2005 0831   HDL 83 06/21/2020 1508   CHOLHDL 2.3 06/21/2020 1508   VLDL 15.2 08/02/2019 1121   LDLCALC 82 06/21/2020 1508   LDLDIRECT 148.1 06/11/2006 1458    Physical Exam:    VS:  BP 118/72   Pulse 86   Resp 20   Ht '5\' 3"'  (1.6 m)   Wt 136 lb (61.7 kg)   SpO2 95%   BMI 24.09 kg/m     Wt Readings from Last 3 Encounters:  08/28/20 136 lb (61.7 kg)  06/21/20 133 lb 9.6 oz (60.6 kg)  06/03/20 131 lb (59.4 kg)     GEN:  Well nourished, well developed in no acute distress HEENT: Normal NECK: No JVD; No carotid bruits LYMPHATICS: No  lymphadenopathy CARDIAC: RRR, no murmurs, rubs, gallops RESPIRATORY:  Clear to auscultation without rales, wheezing or rhonchi  ABDOMEN: Soft, non-tender, non-distended MUSCULOSKELETAL:  No edema; No deformity  SKIN: Warm and dry NEUROLOGIC:  Alert and oriented x 3 PSYCHIATRIC:  Normal affect   ASSESSMENT:    1. Chest pain of uncertain etiology   2. DOE (dyspnea on exertion)   3. Hyperlipidemia, unspecified hyperlipidemia type    PLAN:    Chest pain/DOE: Chest pain is atypical in description, but also having dyspnea/lightheadedness with exertion.  Recommend Lexiscan Myoview to evaluate for ischemia.  Check echocardiogram to evaluate for structural heart disease.  Hyperlipidemia: On rosuvastatin 10 mg daily.  LDL 82 on 07/18/2020.  Check calcium score to guide how aggressive to be in lowering cholesterol  T2DM: on insulin.  A1c 6.9% on 07/23/20  RTC in 6 months  Shared Decision Making/Informed Consent The risks [chest pain, shortness of breath, cardiac arrhythmias, dizziness, blood pressure fluctuations, myocardial infarction, stroke/transient ischemic attack, nausea, vomiting, allergic reaction, radiation exposure, metallic taste sensation and life-threatening complications (estimated to be 1 in 10,000)], benefits (risk stratification, diagnosing coronary artery disease, treatment guidance) and alternatives of a nuclear stress test were discussed in detail with Ms. Leidner and she agrees to proceed.   Medication Adjustments/Labs and Tests Ordered: Current medicines are reviewed at length with the patient today.  Concerns regarding medicines are outlined above.  No orders of the defined types were placed in this encounter.  No orders of the defined types were placed in this encounter.   There are no Patient Instructions on file for this visit.   Signed, Harrell Gave  Fritz Pickerel, MD  08/28/2020 1:38 PM    [facility]

## 2020-08-27 ENCOUNTER — Telehealth: Payer: Self-pay

## 2020-08-27 NOTE — Telephone Encounter (Signed)
Requesting:Xanax 0.'5mg'$  Contract: UDS: Last Visit:06/21/20 Next Visit:12/24/20 Last Refill:06/21/20 30 tabs 1 refill  Please Advise

## 2020-08-28 ENCOUNTER — Encounter: Payer: Self-pay | Admitting: Cardiology

## 2020-08-28 ENCOUNTER — Other Ambulatory Visit: Payer: Self-pay | Admitting: Family

## 2020-08-28 ENCOUNTER — Ambulatory Visit (INDEPENDENT_AMBULATORY_CARE_PROVIDER_SITE_OTHER): Payer: Medicare Other | Admitting: Cardiology

## 2020-08-28 ENCOUNTER — Other Ambulatory Visit: Payer: Self-pay

## 2020-08-28 VITALS — BP 118/72 | HR 86 | Resp 20 | Ht 63.0 in | Wt 136.0 lb

## 2020-08-28 DIAGNOSIS — E785 Hyperlipidemia, unspecified: Secondary | ICD-10-CM | POA: Diagnosis not present

## 2020-08-28 DIAGNOSIS — R0600 Dyspnea, unspecified: Secondary | ICD-10-CM

## 2020-08-28 DIAGNOSIS — R0609 Other forms of dyspnea: Secondary | ICD-10-CM

## 2020-08-28 DIAGNOSIS — R079 Chest pain, unspecified: Secondary | ICD-10-CM

## 2020-08-28 MED ORDER — ALPRAZOLAM 0.5 MG PO TABS: ORAL_TABLET | ORAL | 1 refills | Status: DC

## 2020-08-28 NOTE — Patient Instructions (Signed)
Medication Instructions:  Your physician recommends that you continue on your current medications as directed. Please refer to the Current Medication list given to you today.   *If you need a refill on your cardiac medications before your next appointment, please call your pharmacy*  Testing/Procedures: Your physician has requested that you have an echocardiogram. Echocardiography is a painless test that uses sound waves to create images of your heart. It provides your doctor with information about the size and shape of your heart and how well your heart's chambers and valves are working. This procedure takes approximately one hour. There are no restrictions for this procedure.  Your physician has requested that you have a Lexicographer at [facility]. For further information please visit [email or web address]. Please follow instruction sheet, as given.   How to prepare for your Myocardial Perfusion Test: Do not eat or drink 3 hours prior to your test, except you may have water. Do not consume products containing caffeine (regular or decaffeinated) 12 hours prior to your test. (ex: coffee, chocolate, sodas, tea). Do bring a list of your current medications with you.  If not listed below, you may take your medications as normal. Do wear comfortable clothes (no dresses or overalls) and walking shoes, tennis shoes preferred (No heels or open toe shoes are allowed). Do NOT wear cologne, perfume, aftershave, or lotions (deodorant is allowed). The test will take approximately 3 to 4 hours to complete If these instructions are not followed, your test will have to be rescheduled.   CT coronary calcium score. This test is done at [address]. This is $99 out of pocket.   Coronary CalciumScan A coronary calcium scan is an imaging test used to look for deposits of calcium and other fatty materials (plaques) in the inner lining of the blood vessels of the heart (coronary  arteries). These deposits of calcium and plaques can partly clog and narrow the coronary arteries without producing any symptoms or warning signs. This puts a person at risk for a heart attack. This test can detect these deposits before symptoms develop. Tell a health care provider about: Any allergies you have. All medicines you are taking, including vitamins, herbs, eye drops, creams, and over-the-counter medicines. Any problems you or family members have had with anesthetic medicines. Any blood disorders you have. Any surgeries you have had. Any medical conditions you have. Whether you are pregnant or may be pregnant. What are the risks? Generally, this is a safe procedure. However, problems may occur, including: Harm to a pregnant woman and her unborn baby. This test involves the use of radiation. Radiation exposure can be dangerous to a pregnant woman and her unborn baby. If you are pregnant, you generally should not have this procedure done. Slight increase in the risk of cancer. This is because of the radiation involved in the test. What happens before the procedure? No preparation is needed for this procedure. What happens during the procedure? You will undress and remove any jewelry around your neck or chest. You will put on a hospital gown. Sticky electrodes will be placed on your chest. The electrodes will be connected to an electrocardiogram (ECG) machine to record a tracing of the electrical activity of your heart. A CT scanner will take pictures of your heart. During this time, you will be asked to lie still and hold your breath for 2-3 seconds while a picture of your heart is being taken. The procedure may vary among health care providers  and hospitals. What happens after the procedure? You can get dressed. You can return to your normal activities. It is up to you to get the results of your test. Ask your health care provider, or the department that is doing the test, when your  results will be ready. Summary A coronary calcium scan is an imaging test used to look for deposits of calcium and other fatty materials (plaques) in the inner lining of the blood vessels of the heart (coronary arteries). Generally, this is a safe procedure. Tell your health care provider if you are pregnant or may be pregnant. No preparation is needed for this procedure. A CT scanner will take pictures of your heart. You can return to your normal activities after the scan is done. This information is not intended to replace advice given to you by your health care provider. Make sure you discuss any questions you have with your health care provider. Document Released: 07/04/2007 Document Revised: 11/25/2015 Document Reviewed: 11/25/2015 Elsevier Interactive Patient Education  2017 [place]: At [facility], you and your health needs are our priority.  As part of our continuing mission to provide you with exceptional heart care, we have created designated Provider Care Teams.  These Care Teams include your primary Cardiologist (physician) and Advanced Practice Providers (APPs -  Physician Assistants and Nurse Practitioners) who all work together to provide you with the care you need, when you need it.  We recommend signing up for the patient portal called "MyChart".  Sign up information is provided on this After Visit Summary.  MyChart is used to connect with patients for Virtual Visits (Telemedicine).  Patients are able to view lab/test results, encounter notes, upcoming appointments, etc.  Non-urgent messages can be sent to your provider as well.   To learn more about what you can do with MyChart, go to [email or web address].    Your next appointment:   6 month(s)  The format for your next appointment:   In Person  Provider:   Oswaldo Milian, MD

## 2020-08-29 DIAGNOSIS — E7800 Pure hypercholesterolemia, unspecified: Secondary | ICD-10-CM | POA: Diagnosis not present

## 2020-08-29 DIAGNOSIS — N189 Chronic kidney disease, unspecified: Secondary | ICD-10-CM | POA: Diagnosis not present

## 2020-08-29 DIAGNOSIS — G609 Hereditary and idiopathic neuropathy, unspecified: Secondary | ICD-10-CM | POA: Diagnosis not present

## 2020-08-29 DIAGNOSIS — Z794 Long term (current) use of insulin: Secondary | ICD-10-CM | POA: Diagnosis not present

## 2020-08-29 DIAGNOSIS — Z9641 Presence of insulin pump (external) (internal): Secondary | ICD-10-CM | POA: Diagnosis not present

## 2020-08-29 DIAGNOSIS — E039 Hypothyroidism, unspecified: Secondary | ICD-10-CM | POA: Diagnosis not present

## 2020-08-29 DIAGNOSIS — E1065 Type 1 diabetes mellitus with hyperglycemia: Secondary | ICD-10-CM | POA: Diagnosis not present

## 2020-08-29 DIAGNOSIS — E049 Nontoxic goiter, unspecified: Secondary | ICD-10-CM | POA: Diagnosis not present

## 2020-09-02 DIAGNOSIS — M79642 Pain in left hand: Secondary | ICD-10-CM | POA: Diagnosis not present

## 2020-09-02 DIAGNOSIS — M79645 Pain in left finger(s): Secondary | ICD-10-CM | POA: Diagnosis not present

## 2020-09-06 DIAGNOSIS — M79645 Pain in left finger(s): Secondary | ICD-10-CM | POA: Insufficient documentation

## 2020-09-24 ENCOUNTER — Telehealth (HOSPITAL_COMMUNITY): Payer: Self-pay | Admitting: *Deleted

## 2020-09-24 ENCOUNTER — Encounter (HOSPITAL_COMMUNITY): Payer: Self-pay | Admitting: *Deleted

## 2020-09-24 NOTE — Telephone Encounter (Signed)
Patient given detailed instructions per Myocardial Perfusion Study Information Sheet for the test on 09/30/20 at 1030. Patient notified to arrive 15 minutes early and that it is imperative to arrive on time for appointment to keep from having the test rescheduled.  If you need to cancel or reschedule your appointment, please call the office within 24 hours of your appointment. . Patient verbalized understanding.[place] Mychart letter sent with instructions.

## 2020-09-30 ENCOUNTER — Ambulatory Visit: Payer: Medicare Other

## 2020-09-30 ENCOUNTER — Ambulatory Visit (HOSPITAL_BASED_OUTPATIENT_CLINIC_OR_DEPARTMENT_OTHER): Payer: Medicare Other

## 2020-09-30 ENCOUNTER — Other Ambulatory Visit: Payer: Self-pay

## 2020-09-30 ENCOUNTER — Ambulatory Visit (INDEPENDENT_AMBULATORY_CARE_PROVIDER_SITE_OTHER)
Admission: RE | Admit: 2020-09-30 | Discharge: 2020-09-30 | Disposition: A | Discharge status: Home / Self Care | Payer: Self-pay | Source: Ambulatory Visit | Attending: Cardiology | Admitting: Cardiology

## 2020-09-30 ENCOUNTER — Ambulatory Visit (HOSPITAL_COMMUNITY): Payer: Medicare Other | Attending: Cardiology

## 2020-09-30 DIAGNOSIS — R0600 Dyspnea, unspecified: Secondary | ICD-10-CM | POA: Diagnosis not present

## 2020-09-30 DIAGNOSIS — R0609 Other forms of dyspnea: Secondary | ICD-10-CM

## 2020-09-30 DIAGNOSIS — R079 Chest pain, unspecified: Secondary | ICD-10-CM | POA: Insufficient documentation

## 2020-09-30 LAB — MYOCARDIAL PERFUSION IMAGING
LV dias vol: 46 mL (ref 46–106)
LV sys vol: 14 mL
Nuc Stress EF: 69 %
Peak HR: 113 {beats}/min
Rest HR: 81 {beats}/min
Rest Nuclear Isotope Dose: 10.7 mCi
SDS: 2
SRS: 0
SSS: 2
ST Depression (mm): 0 mm
Stress Nuclear Isotope Dose: 31.3 mCi
TID: 1.06

## 2020-09-30 LAB — ECHOCARDIOGRAM COMPLETE
Area-P 1/2: 4.89 cm2
MV M vel: 5.5 m/s
MV Peak grad: 121 mmHg
Radius: 0.5 cm
S' Lateral: 1.8 cm

## 2020-09-30 MED ORDER — REGADENOSON 0.4 MG/5ML IV SOLN
0.4000 mg | Freq: Once | INTRAVENOUS | Status: AC
  Administered 2020-09-30: 0.4 mg via INTRAVENOUS

## 2020-09-30 MED ORDER — TECHNETIUM TC 99M TETROFOSMIN IV KIT
10.7000 | PACK | Freq: Once | INTRAVENOUS | Status: AC | PRN
  Administered 2020-09-30: 10.7 via INTRAVENOUS
  Filled 2020-09-30: qty 11

## 2020-09-30 MED ORDER — TECHNETIUM TC 99M TETROFOSMIN IV KIT
31.3000 | PACK | Freq: Once | INTRAVENOUS | Status: AC | PRN
  Administered 2020-09-30: 31.3 via INTRAVENOUS
  Filled 2020-09-30: qty 32

## 2020-10-01 ENCOUNTER — Other Ambulatory Visit: Payer: Self-pay | Admitting: *Deleted

## 2020-10-01 ENCOUNTER — Telehealth: Payer: Self-pay | Admitting: *Deleted

## 2020-10-01 MED ORDER — ROSUVASTATIN CALCIUM 20 MG PO TABS: 20.0000 mg | ORAL_TABLET | Freq: Every day | ORAL | 3 refills | Status: DC

## 2020-10-01 NOTE — Chronic Care Management (AMB) (Signed)
  Chronic Care Management   Outreach Note  10/01/2020 Name: ASTARIA AUTREY MRN: [id number] DOB: Nov 02, 1945  [name] [name] is a 75 y.o. year old female who is a primary care patient of Birdie Riddle, Aundra Millet, MD. I reached out to Jonette Eva by phone today in response to a referral sent by Ms. Inis Sizer, MD.     A telephone outreach was attempted today spoke with patient she would like to think about program and a return call in week. The patient was referred to the case management team for assistance with care management and care coordination.   Follow Up Plan: The care management team will reach out to the patient again over the next 7 days. If patient returns call to provider office, please advise to call [place] at [phone].  [facility]  Direct Dial: [phone]

## 2020-10-07 ENCOUNTER — Ambulatory Visit: Payer: Medicare Other

## 2020-10-08 NOTE — Chronic Care Management (AMB) (Signed)
  Chronic Care Management   Note  10/08/2020 Name: [name] [name] MRN: [id number] DOB: [birth year]  [name] [name] is a 75 y.o. year old female who is a primary care patient of Birdie Riddle, Aundra Millet, MD. I reached out to Jonette Eva by phone today in response to a referral sent by [name] PCP, Midge Minium, MD      [name] was given information about Chronic Care Management services today including:  CCM service includes personalized support from designated clinical staff supervised by her physician, including individualized plan of care and coordination with other care providers 24/7 contact phone numbers for assistance for urgent and routine care needs. Service will only be billed when office clinical staff spend 20 minutes or more in a month to coordinate care. Only one practitioner may furnish and bill the service in a calendar month. The patient may stop CCM services at any time (effective at the end of the month) by phone call to the office staff. The patient will be responsible for cost sharing (co-pay) of up to 20% of the service fee (after annual deductible is met).  Patient did not agree to enrollment in care management services and does not wish to consider at this time.  Follow up plan: Patient declines engagement by the care management team. Appropriate care team members and provider have been notified via electronic communication. The care management team is available to follow up with the patient after provider conversation with the patient regarding recommendation for care management engagement and subsequent re-referral to the care management team.   [facility] Management  Direct Dial: [phone]

## 2020-10-14 DIAGNOSIS — E119 Type 2 diabetes mellitus without complications: Secondary | ICD-10-CM | POA: Diagnosis not present

## 2020-10-14 DIAGNOSIS — H0102B Squamous blepharitis left eye, upper and lower eyelids: Secondary | ICD-10-CM | POA: Diagnosis not present

## 2020-10-14 DIAGNOSIS — H16223 Keratoconjunctivitis sicca, not specified as Sjogren's, bilateral: Secondary | ICD-10-CM | POA: Diagnosis not present

## 2020-10-14 DIAGNOSIS — H0102A Squamous blepharitis right eye, upper and lower eyelids: Secondary | ICD-10-CM | POA: Diagnosis not present

## 2020-10-14 DIAGNOSIS — H2513 Age-related nuclear cataract, bilateral: Secondary | ICD-10-CM | POA: Diagnosis not present

## 2020-10-26 ENCOUNTER — Other Ambulatory Visit: Payer: Self-pay | Admitting: Family

## 2020-10-30 ENCOUNTER — Other Ambulatory Visit: Payer: Self-pay

## 2020-10-30 DIAGNOSIS — F411 Generalized anxiety disorder: Secondary | ICD-10-CM

## 2020-10-30 NOTE — Telephone Encounter (Signed)
Caller name:Talor Caillier   Caller callback [phone]  Encourage patient to contact the pharmacy for refills or they can request refills through [facility]:  06/21/20 NEXT APPOINTMENT DATE: 12/24/20  MEDICATION NAME & DOSE:ALPRAZolam (XANAX) 0.5 MG tablet   Is the patient out of medication? yes  PHARMACY: [place], [place] - [address]   Let patient know to contact pharmacy at the end of the day to make sure medication is ready.  Please notify patient to allow 48-72 hours to process Pt is out of medication and refill request was sent to [facility] on 10/08 and patient is needing medication

## 2020-10-31 ENCOUNTER — Other Ambulatory Visit: Payer: Self-pay | Admitting: Family Medicine

## 2020-10-31 ENCOUNTER — Other Ambulatory Visit: Payer: Self-pay | Admitting: Nurse Practitioner

## 2020-10-31 MED ORDER — ALPRAZOLAM 0.5 MG PO TABS: ORAL_TABLET | ORAL | 1 refills | Status: DC

## 2020-11-05 DIAGNOSIS — D485 Neoplasm of uncertain behavior of skin: Secondary | ICD-10-CM | POA: Diagnosis not present

## 2020-11-05 DIAGNOSIS — L814 Other melanin hyperpigmentation: Secondary | ICD-10-CM | POA: Diagnosis not present

## 2020-11-05 DIAGNOSIS — D2271 Melanocytic nevi of right lower limb, including hip: Secondary | ICD-10-CM | POA: Diagnosis not present

## 2020-11-05 DIAGNOSIS — D225 Melanocytic nevi of trunk: Secondary | ICD-10-CM | POA: Diagnosis not present

## 2020-11-05 DIAGNOSIS — L570 Actinic keratosis: Secondary | ICD-10-CM | POA: Diagnosis not present

## 2020-11-05 DIAGNOSIS — L245 Irritant contact dermatitis due to other chemical products: Secondary | ICD-10-CM | POA: Diagnosis not present

## 2020-11-05 DIAGNOSIS — L821 Other seborrheic keratosis: Secondary | ICD-10-CM | POA: Diagnosis not present

## 2020-11-05 DIAGNOSIS — D1801 Hemangioma of skin and subcutaneous tissue: Secondary | ICD-10-CM | POA: Diagnosis not present

## 2020-11-06 ENCOUNTER — Other Ambulatory Visit: Payer: Self-pay | Admitting: *Deleted

## 2020-11-06 DIAGNOSIS — M47812 Spondylosis without myelopathy or radiculopathy, cervical region: Secondary | ICD-10-CM

## 2020-11-06 DIAGNOSIS — G629 Polyneuropathy, unspecified: Secondary | ICD-10-CM

## 2020-11-06 DIAGNOSIS — G500 Trigeminal neuralgia: Secondary | ICD-10-CM

## 2020-11-06 MED ORDER — GABAPENTIN 300 MG PO CAPS: ORAL_CAPSULE | ORAL | 0 refills | Status: DC

## 2020-11-18 ENCOUNTER — Ambulatory Visit: Payer: Medicare Other

## 2020-11-22 DIAGNOSIS — G609 Hereditary and idiopathic neuropathy, unspecified: Secondary | ICD-10-CM | POA: Diagnosis not present

## 2020-11-22 DIAGNOSIS — E1065 Type 1 diabetes mellitus with hyperglycemia: Secondary | ICD-10-CM | POA: Diagnosis not present

## 2020-11-22 DIAGNOSIS — Z9641 Presence of insulin pump (external) (internal): Secondary | ICD-10-CM | POA: Diagnosis not present

## 2020-11-22 DIAGNOSIS — Z23 Encounter for immunization: Secondary | ICD-10-CM | POA: Diagnosis not present

## 2020-11-22 DIAGNOSIS — N189 Chronic kidney disease, unspecified: Secondary | ICD-10-CM | POA: Diagnosis not present

## 2020-11-22 DIAGNOSIS — E049 Nontoxic goiter, unspecified: Secondary | ICD-10-CM | POA: Diagnosis not present

## 2020-11-22 DIAGNOSIS — E7800 Pure hypercholesterolemia, unspecified: Secondary | ICD-10-CM | POA: Diagnosis not present

## 2020-11-22 DIAGNOSIS — Z794 Long term (current) use of insulin: Secondary | ICD-10-CM | POA: Diagnosis not present

## 2020-11-22 DIAGNOSIS — E039 Hypothyroidism, unspecified: Secondary | ICD-10-CM | POA: Diagnosis not present

## 2020-11-25 ENCOUNTER — Ambulatory Visit: Payer: Medicare Other | Admitting: Family Medicine

## 2020-11-26 ENCOUNTER — Ambulatory Visit: Payer: Medicare Other | Admitting: Family Medicine

## 2020-12-04 ENCOUNTER — Other Ambulatory Visit: Payer: Self-pay | Admitting: Neurology

## 2020-12-04 DIAGNOSIS — G500 Trigeminal neuralgia: Secondary | ICD-10-CM

## 2020-12-04 DIAGNOSIS — G629 Polyneuropathy, unspecified: Secondary | ICD-10-CM

## 2020-12-04 DIAGNOSIS — M47812 Spondylosis without myelopathy or radiculopathy, cervical region: Secondary | ICD-10-CM

## 2020-12-05 DIAGNOSIS — H2511 Age-related nuclear cataract, right eye: Secondary | ICD-10-CM | POA: Diagnosis not present

## 2020-12-19 DIAGNOSIS — H2512 Age-related nuclear cataract, left eye: Secondary | ICD-10-CM | POA: Diagnosis not present

## 2020-12-24 ENCOUNTER — Ambulatory Visit (INDEPENDENT_AMBULATORY_CARE_PROVIDER_SITE_OTHER): Payer: Medicare Other | Admitting: Family Medicine

## 2020-12-24 ENCOUNTER — Encounter: Payer: Self-pay | Admitting: Family Medicine

## 2020-12-24 VITALS — BP 112/72 | HR 85 | Temp 98.4°F | Resp 16 | Wt 136.2 lb

## 2020-12-24 DIAGNOSIS — F411 Generalized anxiety disorder: Secondary | ICD-10-CM | POA: Diagnosis not present

## 2020-12-24 DIAGNOSIS — E1169 Type 2 diabetes mellitus with other specified complication: Secondary | ICD-10-CM

## 2020-12-24 DIAGNOSIS — E785 Hyperlipidemia, unspecified: Secondary | ICD-10-CM

## 2020-12-24 DIAGNOSIS — Z9641 Presence of insulin pump (external) (internal): Secondary | ICD-10-CM | POA: Insufficient documentation

## 2020-12-24 LAB — HEPATIC FUNCTION PANEL
ALT: 13 U/L (ref 0–35)
AST: 18 U/L (ref 0–37)
Albumin: 4.4 g/dL (ref 3.5–5.2)
Alkaline Phosphatase: 71 U/L (ref 39–117)
Bilirubin, Direct: 0.1 mg/dL (ref 0.0–0.3)
Total Bilirubin: 0.4 mg/dL (ref 0.2–1.2)
Total Protein: 7 g/dL (ref 6.0–8.3)

## 2020-12-24 LAB — BASIC METABOLIC PANEL
BUN: 34 mg/dL — ABNORMAL HIGH (ref 6–23)
CO2: 30 mEq/L (ref 19–32)
Calcium: 9.7 mg/dL (ref 8.4–10.5)
Chloride: 101 mEq/L (ref 96–112)
Creatinine, Ser: 0.9 mg/dL (ref 0.40–1.20)
GFR: 62.78 mL/min (ref >60.00)
Glucose, Bld: 124 mg/dL — ABNORMAL HIGH (ref 70–99)
Potassium: 4.5 mEq/L (ref 3.5–5.1)
Sodium: 139 mEq/L (ref 135–145)

## 2020-12-24 LAB — LIPID PANEL
Cholesterol: 201 mg/dL — ABNORMAL HIGH (ref 0–200)
HDL: 90.6 mg/dL (ref >39.00)
LDL Cholesterol: 90 mg/dL (ref 0–99)
NonHDL: 110.63
Total CHOL/HDL Ratio: 2
Triglycerides: 104 mg/dL (ref 0.0–149.0)
VLDL: 20.8 mg/dL (ref 0.0–40.0)

## 2020-12-24 MED ORDER — ALPRAZOLAM 0.5 MG PO TABS: ORAL_TABLET | ORAL | 1 refills | Status: DC

## 2020-12-24 NOTE — Assessment & Plan Note (Signed)
Chronic problem, on Crestor 20mg  daily w/o difficulty.  Check labs.  Adjust meds prn

## 2020-12-24 NOTE — Assessment & Plan Note (Signed)
Refill on Alprazolam provided.  Reports anxiety is fairly stable.  Plans to finally get a dog after all these years and she is very excited about this.

## 2020-12-24 NOTE — Progress Notes (Signed)
   Subjective:    Patient ID: [name] [name], female    DOB: 02-Oct-1945, 75 y.o.   MRN: [id number]  HPI Hyperlipidemia- chronic problem, on Crestor 20mg  daily.  Denies CP, SOB, abd pain, N/V.  Anxiety- pt needs refill on Alprazolam.  Also on Amitriptyline.  UTD on mammo, eye exam, PNA, flu, Tdap, colonoscopy.   Review of Systems For ROS see HPI   This visit occurred during the SARS-CoV-2 public health emergency.  Safety protocols were in place, including screening questions prior to the visit, additional usage of staff PPE, and extensive cleaning of exam room while observing appropriate contact time as indicated for disinfecting solutions.      Objective:   Physical Exam Vitals reviewed.  Constitutional:      General: [name].    Appearance: Normal appearance. [name]. She is not ill-appearing.  HENT:     Head: Normocephalic and atraumatic.  Eyes:     Conjunctiva/sclera: Conjunctivae normal.     Pupils: Pupils are equal, round, and reactive to light.  Neck:     Thyroid: No thyromegaly.  Cardiovascular:     Rate and Rhythm: Normal rate and regular rhythm.     Pulses: Normal pulses.     Heart sounds: Normal heart sounds. No murmur heard. Pulmonary:     Effort: Pulmonary effort is normal. No respiratory distress.     Breath sounds: Normal breath sounds.  Abdominal:     General: There is no distension.     Palpations: Abdomen is soft.     Tenderness: There is no abdominal tenderness.  Musculoskeletal:     Cervical back: Normal range of motion and neck supple.     Right lower leg: No edema.     Left lower leg: No edema.  Lymphadenopathy:     Cervical: No cervical adenopathy.  Skin:    General: Skin is warm and dry.  Neurological:     Mental Status: She is alert and oriented to person, place, and time.  Psychiatric:        Behavior: Behavior normal.          Assessment & Plan:

## 2020-12-24 NOTE — Patient Instructions (Signed)
Follow up in 6 months to recheck cholesterol We'll notify you of your lab results and make any changes if needed Keep up the good work on healthy diet and regular exercise- you look great! Call with any questions or concerns Stay Safe!  Stay Healthy! Happy Birthday! Happy Holidays!!

## 2020-12-25 ENCOUNTER — Telehealth: Payer: Self-pay

## 2020-12-25 NOTE — Telephone Encounter (Signed)
Lvm for patient regarding labs.

## 2020-12-25 NOTE — Telephone Encounter (Signed)
-----   Message from Midge Minium, MD sent at 12/25/2020  7:43 AM EST ----- Labs look great!  No changes at this time

## 2020-12-31 DIAGNOSIS — E139 Other specified diabetes mellitus without complications: Secondary | ICD-10-CM | POA: Diagnosis not present

## 2020-12-31 DIAGNOSIS — M6281 Muscle weakness (generalized): Secondary | ICD-10-CM | POA: Diagnosis not present

## 2020-12-31 DIAGNOSIS — L03032 Cellulitis of left toe: Secondary | ICD-10-CM | POA: Diagnosis not present

## 2020-12-31 DIAGNOSIS — L02612 Cutaneous abscess of left foot: Secondary | ICD-10-CM | POA: Diagnosis not present

## 2021-01-15 DIAGNOSIS — L03032 Cellulitis of left toe: Secondary | ICD-10-CM | POA: Diagnosis not present

## 2021-01-15 DIAGNOSIS — E139 Other specified diabetes mellitus without complications: Secondary | ICD-10-CM | POA: Diagnosis not present

## 2021-01-15 DIAGNOSIS — L600 Ingrowing nail: Secondary | ICD-10-CM | POA: Diagnosis not present

## 2021-01-17 DIAGNOSIS — H0102A Squamous blepharitis right eye, upper and lower eyelids: Secondary | ICD-10-CM | POA: Diagnosis not present

## 2021-01-17 DIAGNOSIS — Z961 Presence of intraocular lens: Secondary | ICD-10-CM | POA: Diagnosis not present

## 2021-01-17 DIAGNOSIS — H0102B Squamous blepharitis left eye, upper and lower eyelids: Secondary | ICD-10-CM | POA: Diagnosis not present

## 2021-01-17 DIAGNOSIS — H16223 Keratoconjunctivitis sicca, not specified as Sjogren's, bilateral: Secondary | ICD-10-CM | POA: Diagnosis not present

## 2021-01-30 DIAGNOSIS — L03032 Cellulitis of left toe: Secondary | ICD-10-CM | POA: Diagnosis not present

## 2021-01-30 DIAGNOSIS — L03031 Cellulitis of right toe: Secondary | ICD-10-CM | POA: Diagnosis not present

## 2021-01-30 DIAGNOSIS — E139 Other specified diabetes mellitus without complications: Secondary | ICD-10-CM | POA: Diagnosis not present

## 2021-01-30 DIAGNOSIS — M6281 Muscle weakness (generalized): Secondary | ICD-10-CM | POA: Diagnosis not present

## 2021-02-05 ENCOUNTER — Other Ambulatory Visit: Payer: Self-pay

## 2021-02-05 DIAGNOSIS — G500 Trigeminal neuralgia: Secondary | ICD-10-CM

## 2021-02-05 DIAGNOSIS — G629 Polyneuropathy, unspecified: Secondary | ICD-10-CM

## 2021-02-05 DIAGNOSIS — M47812 Spondylosis without myelopathy or radiculopathy, cervical region: Secondary | ICD-10-CM

## 2021-02-05 MED ORDER — GABAPENTIN 300 MG PO CAPS: ORAL_CAPSULE | ORAL | 0 refills | Status: DC

## 2021-02-23 NOTE — Progress Notes (Deleted)
Cardiology Office Note:    Date:  02/28/2021   ID:  [name] [name], DOB 1945/02/15, MRN [id number]  PCP:  Midge Minium, MD  Cardiologist:  None  Electrophysiologist:  None   Referring MD: Midge Minium, MD   Chief Complaint  Patient presents with   Shortness of Breath    History of Present Illness:    [name] [name] is a 76 y.o. female with a hx of T2DM, hyperlipidemia, hypothyroidism, melanoma who presents for follow-up.  She was referred by Dr. Birdie Riddle for evaluation of aortic atherosclerosis, initially seen on 08/29/2018.  Noted on CT abdomen pelvis 06/03/2020.  She reports that she does water aerobics 3 days/week, reports she has been developing shortness of breath and lightheadedness with exertion.  She denies any chest pain during these episodes.  However has been having episodes at night where she feels like her chest and arms are throbbing, lasts for 5 to 30 minutes.  Occurs most nights.  She denies any syncope or lower extremity edema.  Denies any palpitations.  She quit smoking at age 54.  No known history of heart disease in her immediate family.  Echocardiogram on 09/30/2020 showed normal biventricular function, grade 1 diastolic dysfunction, no significant valvular disease.  Lexiscan Myoview on 09/30/2020 showed normal perfusion, EF 69%.  Calcium score on 09/30/2020 was 213 (75th percentile).  Since last clinic visit,  Past Medical History:  Diagnosis Date   Abdominal pain    EGD and CTs neg, rx Amitriptiline (non ulcer dyspepsia) EGD 12/11.. s/p ballon dilitation   Anxiety    Cataract    Cervical spondylosis without myelopathy 11/30/2012   Chronic conjunctivitis    Diabetes mellitus    no meds, diet controlled   Diverticulosis of colon    GERD with stricture    s/p dilatations   History of Lyme disease    HOH (hard of hearing)    bilateral hearing aids   Hypercholesterolemia    Hyperlipidemia    Hypothyroidism     Dx 10-9 TSH 5.2 , then TSH normalized,  eventually went up again 2012   Melanoma [name] [name])    h/o   Memory difficulties 06/05/2014   Osteopenia    Osteoporosis    Solar lentigo    face; s/p excision 3/09. See Derm q 6 months   Trigeminal neuralgia    h/o     Past Surgical History:  Procedure Laterality Date   ABDOMINAL HYSTERECTOMY     ANKLE SURGERY  07/01/2015   R andkle tendon surgery   BREAST BIOPSY Left 1988   BREAST EXCISIONAL BIOPSY Left 1988   CERVICAL FUSION  06/2017   CHOLECYSTECTOMY     COLON RESECTION  1999   d/t diverticulitis   COLON SURGERY     COLONOSCOPY     ESOPHAGEAL MANOMETRY N/A 03/17/2019   Procedure: ESOPHAGEAL MANOMETRY (EM)  NO PH;  Surgeon: Lavena Bullion, DO;  Location: [facility];  Service: Gastroenterology;  Laterality: N/A;   LUMBAR DISC SURGERY  07/25/18, 09/20   melanoma resection Right     Right Arm Surgery, R facial surgery (05-2012)      PTX s/t chest tube     SHOULDER SURGERY     right   UPPER GASTROINTESTINAL ENDOSCOPY     WRIST SURGERY     removed melaoma, right    Current Medications: Current Meds  Medication Sig   ALPRAZolam (XANAX) 0.5 MG tablet TAKE 1 TABLET(0.5 MG) BY MOUTH AT BEDTIME AS  NEEDED FOR SLEEP   amitriptyline (ELAVIL) 25 MG tablet TAKE 1 TABLET(25 MG) BY MOUTH AT BEDTIME   aspirin 81 MG EC tablet Take 1 tablet (81 mg total) by mouth daily. Swallow whole.   baclofen (LIORESAL) 10 MG tablet Take 10 mg by mouth 2 (two) times daily. May take TID is needed   Cholecalciferol (VITAMIN D3) 2000 UNITS TABS Take 1 tablet by mouth daily.   Continuous Blood Gluc Receiver (FREESTYLE LIBRE READER) DEVI by Does not apply route.   diclofenac Sodium (VOLTAREN) 1 % GEL Apply 2 g topically as needed.   famotidine (PEPCID) 20 MG tablet TAKE 1 TABLET(20 MG) BY MOUTH AT BEDTIME   gabapentin (NEURONTIN) 300 MG capsule TAKE TWO CAPSULES IN THE MORNING AND 2 CAPSULES AT BEDTIME   hyoscyamine (LEVSIN SL) 0.125 MG SL tablet Take 0.125 mg by mouth as needed.   Insulin Disposable Pump  (OMNIPOD DASH INTRO, GEN 4,) KIT by Does not apply route. Change q 3 days   levothyroxine (SYNTHROID, LEVOTHROID) 50 MCG tablet Take 50 mcg by mouth daily before breakfast. Take 1mg M-F and 1060m on Sat and Sun   Lifitegrast 5 % SOLN Apply 1 each to eye as needed.   Melatonin 5 MG TABS Take 1 tablet by mouth at bedtime.   miconazole (MONISTAT 7) 2 % vaginal cream Apply cream externally to vagina where irritated daily   Multiple Vitamin (MULTIVITAMIN) tablet Take 1 tablet by mouth daily.   pantoprazole (PROTONIX) 40 MG tablet TAKE 1 TABLET(40 MG) BY MOUTH DAILY   Peppermint Oil (IBGARD) 90 MG CPCR Take as directed as needed   polyethylene glycol (MIRALAX / GLYCOLAX) 17 g packet Take 34 g by mouth daily.   Probiotic Product (PROBIOTIC PO) Take 1 tablet by mouth daily.   rosuvastatin (CRESTOR) 20 MG tablet Take 1 tablet (20 mg total) by mouth daily.   sulfamethoxazole-trimethoprim (BACTRIM DS) 800-160 MG tablet Take 1 tablet by mouth 2 (two) times daily.     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   Patient is right handed.   Patient has college education.   Patient does not drink caffeine.          Social  Determinants of Health   Financial Resource Strain: Not on file  Food Insecurity: Not on file  Transportation Needs: Not on file  Physical Activity: Not on file  Stress: Not on file  Social Connections: Not on file     Family History: The patient'sfamily history includes Endometrial cancer in her sister; Heart attack in her paternal grandfather; Melanoma in her maternal uncle; Prostate cancer in her  father; Stroke in her sister. There is no history of Colon cancer, Breast cancer, Diabetes, Stomach cancer, Rectal cancer, or Esophageal cancer.  ROS:   Please see the history of present illness.     All other systems reviewed and are negative.  EKGs/Labs/Other Studies Reviewed:    The following studies were reviewed today:   EKG:  EKG is ordered today.  The ekg ordered today demonstrates NSR, rate 86, biatrial enlargement  Recent Labs: 06/03/2020: Magnesium 2.2 06/21/2020: Hemoglobin 13.6; Platelets 243 12/24/2020: ALT 13; BUN 34; Creatinine, Ser 0.90; Potassium 4.5; Sodium 139  Recent Lipid Panel    Component Value Date/Time   CHOL 201 (H) 12/24/2020 1054   TRIG 104.0 12/24/2020 1054   TRIG 37 11/11/2005 0831   HDL 90.60 12/24/2020 1054   CHOLHDL 2 12/24/2020 1054   VLDL 20.8 12/24/2020 1054   LDLCALC 90 12/24/2020 1054   LDLCALC 82 06/21/2020 1508   LDLDIRECT 148.1 06/11/2006 1458    Physical Exam:    VS:  BP 122/68    Pulse 84    Ht '5\' 3"'  (1.6 m)    Wt 137 lb (62.1 kg)    SpO2 98%    BMI 24.27 kg/m     Wt Readings from Last 3 Encounters:  02/28/21 137 lb (62.1 kg)  02/26/21 135 lb 6.4 oz (61.4 kg)  12/24/20 136 lb 3.2 oz (61.8 kg)     GEN:  Well nourished, well developed in no acute distress HEENT: Normal NECK: No JVD; No carotid bruits LYMPHATICS: No lymphadenopathy CARDIAC: RRR, no murmurs, rubs, gallops RESPIRATORY:  Clear to auscultation without rales, wheezing or rhonchi  ABDOMEN: Soft, non-tender, non-distended MUSCULOSKELETAL:  No edema; No deformity   SKIN: Warm and dry NEUROLOGIC:  Alert and oriented x 3 PSYCHIATRIC:  Normal affect   ASSESSMENT:    1. DOE (dyspnea on exertion)   2. Hyperlipidemia associated with type 2 diabetes mellitus (HCC)   3. Chest pain of uncertain etiology   4. Coronary artery disease involving native coronary artery of native heart without angina pectoris     PLAN:    Chest pain/DOE: Chest pain is atypical in description, but also having dyspnea/lightheadedness with exertion.  Echocardiogram on 09/30/2020 showed normal biventricular function, grade 1 diastolic dysfunction, no significant valvular disease.  Lexiscan Myoview on 09/30/2020 showed normal perfusion, EF 69%.    Hyperlipidemia: On rosuvastatin 10 mg daily.  LDL 82 on 07/18/2020.  Calcium score on 09/30/2020 was 213 (75th percentile), rosuvastatin dose increased to 20 mg -Check lipid panel  T2DM: on insulin.  A1c 6.9% on 07/23/20  RTC in 1 year   Medication Adjustments/Labs and Tests Ordered: Current medicines are reviewed at length with the patient today.  Concerns regarding medicines are outlined above.  Orders Placed This Encounter  Procedures   Lipid panel   EKG 12-Lead    No orders of the defined types were placed in this encounter.    Patient Instructions  Medication Instructions:  Continue all medications *If you need a refill on your cardiac medications before your next appointment, please call your pharmacy*   Lab Work: Lipid panel today   Testing/Procedures: None ordered   Follow-Up: At [facility], you and your health needs are our priority.  As part of our continuing mission to provide you with exceptional heart care, we have created designated Provider Care Teams.  These Care Teams include your primary Cardiologist (physician) and Advanced Practice Providers (APPs -  Physician Assistants and Nurse Practitioners) who  all work together to provide you with the care you need, when you need it.  We recommend signing up  for the patient portal called "MyChart".  Sign up information is provided on this After Visit Summary.  MyChart is used to connect with patients for Virtual Visits (Telemedicine).  Patients are able to view lab/test results, encounter notes, upcoming appointments, etc.  Non-urgent messages can be sent to your provider as well.   To learn more about what you can do with MyChart, go to [email or web address].    Your next appointment:  1 year    The format for your next appointment:  Office   Provider:  Dr.Kaori Jumper     Signed, Donato Heinz, MD  02/28/2021 8:47 AM    [place]

## 2021-02-24 ENCOUNTER — Encounter: Payer: Self-pay | Admitting: Family Medicine

## 2021-02-24 ENCOUNTER — Other Ambulatory Visit: Payer: Self-pay | Admitting: Family Medicine

## 2021-02-24 DIAGNOSIS — F411 Generalized anxiety disorder: Secondary | ICD-10-CM

## 2021-02-26 ENCOUNTER — Encounter: Payer: Self-pay | Admitting: Family Medicine

## 2021-02-26 ENCOUNTER — Ambulatory Visit (INDEPENDENT_AMBULATORY_CARE_PROVIDER_SITE_OTHER): Payer: Medicare Other | Admitting: Family Medicine

## 2021-02-26 VITALS — BP 110/70 | HR 86 | Temp 98.1°F | Resp 16 | Wt 135.4 lb

## 2021-02-26 DIAGNOSIS — R300 Dysuria: Secondary | ICD-10-CM | POA: Diagnosis not present

## 2021-02-26 LAB — POCT URINALYSIS DIPSTICK
Bilirubin, UA: NEGATIVE
Blood, UA: NEGATIVE
Glucose, UA: NEGATIVE
Ketones, UA: NEGATIVE
Protein, UA: NEGATIVE
Spec Grav, UA: 1.015 (ref 1.010–1.025)
Urobilinogen, UA: 0.2 E.U./dL
pH, UA: 6 (ref 5.0–8.0)

## 2021-02-26 MED ORDER — SULFAMETHOXAZOLE-TRIMETHOPRIM 800-160 MG PO TABS: 1.0000 | ORAL_TABLET | Freq: Two times a day (BID) | ORAL | 0 refills | Status: DC

## 2021-02-26 MED ORDER — MICONAZOLE NITRATE 2 % VA CREA: TOPICAL_CREAM | VAGINAL | 0 refills | Status: DC

## 2021-02-26 NOTE — Progress Notes (Signed)
° °  Subjective:    Patient ID: [name] [name], female    DOB: 13-Sep-1945, 76 y.o.   MRN: [id number]  HPI Possible UTI- 2 weeks ago she noted her urine was darker than usual.  Over the weekend developed burning w/ urination.  Did a self exam last night and noted redness and irritation of labia minora.  Denies itching.  No discharge.  Increased frequency.  + urgency.  Denies hesitancy.  Denies suprapubic pressure or pain.     Review of Systems For ROS see HPI   This visit occurred during the SARS-CoV-2 public health emergency.  Safety protocols were in place, including screening questions prior to the visit, additional usage of staff PPE, and extensive cleaning of exam room while observing appropriate contact time as indicated for disinfecting solutions.      Objective:   Physical Exam Vitals reviewed.  Constitutional:      General: [name].    Appearance: Normal appearance. She is not ill-appearing.  HENT:     Head: Normocephalic and atraumatic.  Eyes:     Extraocular Movements: Extraocular movements intact.     Conjunctiva/sclera: Conjunctivae normal.     Pupils: Pupils are equal, round, and reactive to light.  Abdominal:     General: Abdomen is flat. There is no distension.     Palpations: Abdomen is soft.     Tenderness: There is no abdominal tenderness. There is no right CVA tenderness, left CVA tenderness, guarding or rebound.  Skin:    General: Skin is warm and dry.  Neurological:     General: No focal deficit present.     Mental Status: She is alert and oriented to person, place, and time.  Psychiatric:        Mood and Affect: Mood normal.        Behavior: Behavior normal.          Assessment & Plan:   Burning w/ urination- new.  UA is suspicious for infxn so will tx w/ Bactrim.  But the red, irritated vaginal area could be consistent w/ yeast and also cause burning.  Pt to apply topical Monistat for sx relief.  Pt expressed understanding and is  in agreement w/ plan.

## 2021-02-26 NOTE — Patient Instructions (Signed)
Follow up as needed or as scheduled START the Trimethoprim Sulfa (Bactrim) twice daily- take w/ food Drink LOTS of water APPLY Miconazole cream daily to the irritated areas Call with any questions or concerns Happy Valentine's Day!!!

## 2021-02-28 ENCOUNTER — Encounter: Payer: Self-pay | Admitting: Cardiology

## 2021-02-28 ENCOUNTER — Ambulatory Visit (INDEPENDENT_AMBULATORY_CARE_PROVIDER_SITE_OTHER): Payer: Medicare Other | Admitting: Cardiology

## 2021-02-28 ENCOUNTER — Other Ambulatory Visit: Payer: Self-pay

## 2021-02-28 VITALS — BP 122/68 | HR 84 | Ht 63.0 in | Wt 137.0 lb

## 2021-02-28 DIAGNOSIS — E785 Hyperlipidemia, unspecified: Secondary | ICD-10-CM

## 2021-02-28 DIAGNOSIS — I2510 Atherosclerotic heart disease of native coronary artery without angina pectoris: Secondary | ICD-10-CM | POA: Diagnosis not present

## 2021-02-28 DIAGNOSIS — R0609 Other forms of dyspnea: Secondary | ICD-10-CM | POA: Diagnosis not present

## 2021-02-28 DIAGNOSIS — E1169 Type 2 diabetes mellitus with other specified complication: Secondary | ICD-10-CM

## 2021-02-28 DIAGNOSIS — R079 Chest pain, unspecified: Secondary | ICD-10-CM | POA: Diagnosis not present

## 2021-02-28 LAB — LIPID PANEL
Chol/HDL Ratio: 2 ratio (ref 0.0–4.4)
Cholesterol, Total: 170 mg/dL (ref 100–199)
HDL: 84 mg/dL (ref >39)
LDL Chol Calc (NIH): 76 mg/dL (ref 0–99)
Triglycerides: 46 mg/dL (ref 0–149)
VLDL Cholesterol Cal: 10 mg/dL (ref 5–40)

## 2021-02-28 NOTE — Patient Instructions (Signed)
Medication Instructions:  Continue all medications *If you need a refill on your cardiac medications before your next appointment, please call your pharmacy*   Lab Work: Lipid panel today   Testing/Procedures: None ordered   Follow-Up: At [facility], you and your health needs are our priority.  As part of our continuing mission to provide you with exceptional heart care, we have created designated Provider Care Teams.  These Care Teams include your primary Cardiologist (physician) and Advanced Practice Providers (APPs -  Physician Assistants and Nurse Practitioners) who all work together to provide you with the care you need, when you need it.  We recommend signing up for the patient portal called "MyChart".  Sign up information is provided on this After Visit Summary.  MyChart is used to connect with patients for Virtual Visits (Telemedicine).  Patients are able to view lab/test results, encounter notes, upcoming appointments, etc.  Non-urgent messages can be sent to your provider as well.   To learn more about what you can do with MyChart, go to [email or web address].    Your next appointment:  1 year    The format for your next appointment:  Office   Provider:  Dr.Schumann

## 2021-02-28 NOTE — Progress Notes (Signed)
Cardiology Office Note:    Date:  02/28/2021   ID:  [name] [name], DOB [birth year], MRN [id number]  PCP:  Midge Minium, MD  Cardiologist:  None  Electrophysiologist:  None   Referring MD: Midge Minium, MD   Chief Complaint  Patient presents with   Shortness of Breath    History of Present Illness:    [name] [name] is a 76 y.o. female with a hx of T2DM, hyperlipidemia, hypothyroidism, melanoma who presents for follow-up.  She was referred by Dr. Birdie Riddle for evaluation of aortic atherosclerosis, initially seen on 08/29/2018.  Noted on CT abdomen pelvis 06/03/2020.  She reports that she does water aerobics 3 days/week, reports she has been developing shortness of breath and lightheadedness with exertion.  She denies any chest pain during these episodes.  However has been having episodes at night where she feels like her chest and arms are throbbing, lasts for 5 to 30 minutes.  Occurs most nights.  She denies any syncope or lower extremity edema.  Denies any palpitations.  She quit smoking at age 66.  No known history of heart disease in her immediate family.  Echocardiogram on 09/30/2020 showed normal biventricular function, grade 1 diastolic dysfunction, no significant valvular disease.  Lexiscan Myoview on 09/30/2020 showed normal perfusion, EF 69%.  Calcium score on 09/30/2020 was 213 (75th percentile).  Since last clinic visit, she reports that she has been doing well.  She does water aerobics 3 days/week for 1 hour.  Denies any exertional chest pain but does report some shortness of breath.  Reports some lightheadedness but denies any syncope.  No lower extremity edema or palpitations.  Past Medical History:  Diagnosis Date   Abdominal pain    EGD and CTs neg, rx Amitriptiline (non ulcer dyspepsia) EGD 12/11.. s/p ballon dilitation   Anxiety    Cataract    Cervical spondylosis without myelopathy 11/30/2012   Chronic conjunctivitis    Diabetes mellitus    no meds, diet  controlled   Diverticulosis of colon    GERD with stricture    s/p dilatations   History of Lyme disease    HOH (hard of hearing)    bilateral hearing aids   Hypercholesterolemia    Hyperlipidemia    Hypothyroidism     Dx 10-9 TSH 5.2 , then TSH normalized, eventually went up again 2012   Melanoma [facility])    h/o   Memory difficulties 06/05/2014   Osteopenia    Osteoporosis    Solar lentigo    face; s/p excision 3/09. See Derm q 6 months   Trigeminal neuralgia    h/o     Past Surgical History:  Procedure Laterality Date   ABDOMINAL HYSTERECTOMY     ANKLE SURGERY  07/01/2015   R andkle tendon surgery   BREAST BIOPSY Left 1988   BREAST EXCISIONAL BIOPSY Left 1988   CERVICAL FUSION  06/2017   CHOLECYSTECTOMY     COLON RESECTION  1999   d/t diverticulitis   COLON SURGERY     COLONOSCOPY     ESOPHAGEAL MANOMETRY N/A 03/17/2019   Procedure: ESOPHAGEAL MANOMETRY (EM)  NO PH;  Surgeon: Lavena Bullion, DO;  Location: [facility];  Service: Gastroenterology;  Laterality: N/A;   LUMBAR DISC SURGERY  07/25/18, 09/20   melanoma resection Right     Right Arm Surgery, R facial surgery (05-2012)      PTX s/t chest tube     SHOULDER SURGERY  right   UPPER GASTROINTESTINAL ENDOSCOPY     WRIST SURGERY     removed melaoma, right    Current Medications: Current Meds  Medication Sig   ALPRAZolam (XANAX) 0.5 MG tablet TAKE 1 TABLET(0.5 MG) BY MOUTH AT BEDTIME AS NEEDED FOR SLEEP   amitriptyline (ELAVIL) 25 MG tablet TAKE 1 TABLET(25 MG) BY MOUTH AT BEDTIME   aspirin 81 MG EC tablet Take 1 tablet (81 mg total) by mouth daily. Swallow whole.   baclofen (LIORESAL) 10 MG tablet Take 10 mg by mouth 2 (two) times daily. May take TID is needed   Cholecalciferol (VITAMIN D3) 2000 UNITS TABS Take 1 tablet by mouth daily.   Continuous Blood Gluc Receiver (FREESTYLE LIBRE READER) DEVI by Does not apply route.   diclofenac Sodium (VOLTAREN) 1 % GEL Apply 2 g topically as needed.   famotidine  (PEPCID) 20 MG tablet TAKE 1 TABLET(20 MG) BY MOUTH AT BEDTIME   gabapentin (NEURONTIN) 300 MG capsule TAKE TWO CAPSULES IN THE MORNING AND 2 CAPSULES AT BEDTIME   hyoscyamine (LEVSIN SL) 0.125 MG SL tablet Take 0.125 mg by mouth as needed.   Insulin Disposable Pump (OMNIPOD DASH INTRO, GEN 4,) KIT by Does not apply route. Change q 3 days   levothyroxine (SYNTHROID, LEVOTHROID) 50 MCG tablet Take 50 mcg by mouth daily before breakfast. Take 86mg M-F and 1078m on Sat and Sun   Lifitegrast 5 % SOLN Apply 1 each to eye as needed.   Melatonin 5 MG TABS Take 1 tablet by mouth at bedtime.   miconazole (MONISTAT 7) 2 % vaginal cream Apply cream externally to vagina where irritated daily   Multiple Vitamin (MULTIVITAMIN) tablet Take 1 tablet by mouth daily.   pantoprazole (PROTONIX) 40 MG tablet TAKE 1 TABLET(40 MG) BY MOUTH DAILY   Peppermint Oil (IBGARD) 90 MG CPCR Take as directed as needed   polyethylene glycol (MIRALAX / GLYCOLAX) 17 g packet Take 34 g by mouth daily.   Probiotic Product (PROBIOTIC PO) Take 1 tablet by mouth daily.   rosuvastatin (CRESTOR) 20 MG tablet Take 1 tablet (20 mg total) by mouth daily.   sulfamethoxazole-trimethoprim (BACTRIM DS) 800-160 MG tablet Take 1 tablet by mouth 2 (two) times daily.     Allergies:   Rocephin [ceftriaxone], Prednisone, Ibandronic acid, Other, Tape, Tramadol hcl, Benadryl [diphenhydramine hcl], Repaglinide, and Sitagliptin   Social History   Socioeconomic History   Marital status: Married    Spouse name: EdPercell Miller Number of children: 2   Years of education: college   Highest education level: Not on file  Occupational History   Occupation: retired    EmFish farm managerRETIRED  Tobacco Use   Smoking status: Former    Types: Cigarettes    Start date: 01/20/1976    Quit date: 1981    Years since quitting: 42.1   Smokeless tobacco: Never  Vaping Use   Vaping Use: Never used  Substance and Sexual Activity   Alcohol use: Not Currently   Drug  use: No   Sexual activity: Not Currently  Other Topics Concern   Not on file  Social History Narrative   Patient is married with 2 children.   Patient is right handed.   Patient has college education.   Patient does not drink caffeine.          Social Determinants of Health   Financial Resource Strain: Not on file  Food Insecurity: Not on file  Transportation Needs: Not on file  Physical  Activity: Not on file  Stress: Not on file  Social Connections: Not on file     Family History: The patient'sfamily history includes Endometrial cancer in her sister; Heart attack in her paternal grandfather; Melanoma in her maternal uncle; Prostate cancer in her father; Stroke in her sister. There is no history of Colon cancer, Breast cancer, Diabetes, Stomach cancer, Rectal cancer, or Esophageal cancer.  ROS:   Please see the history of present illness.     All other systems reviewed and are negative.  EKGs/Labs/Other Studies Reviewed:    The following studies were reviewed today:   EKG:   02/27/21: Normal sinus rhythm, rate 84, biatrial enlargement   Recent Labs: 06/03/2020: Magnesium 2.2 06/21/2020: Hemoglobin 13.6; Platelets 243 12/24/2020: ALT 13; BUN 34; Creatinine, Ser 0.90; Potassium 4.5; Sodium 139  Recent Lipid Panel    Component Value Date/Time   CHOL 201 (H) 12/24/2020 1054   TRIG 104.0 12/24/2020 1054   TRIG 37 11/11/2005 0831   HDL 90.60 12/24/2020 1054   CHOLHDL 2 12/24/2020 1054   VLDL 20.8 12/24/2020 1054   LDLCALC 90 12/24/2020 1054   LDLCALC 82 06/21/2020 1508   LDLDIRECT 148.1 06/11/2006 1458    Physical Exam:    VS:  BP 122/68    Pulse 84    Ht '5\' 3"'  (1.6 m)    Wt 137 lb (62.1 kg)    SpO2 98%    BMI 24.27 kg/m     Wt Readings from Last 3 Encounters:  02/28/21 137 lb (62.1 kg)  02/26/21 135 lb 6.4 oz (61.4 kg)  12/24/20 136 lb 3.2 oz (61.8 kg)     GEN:  Well nourished, well developed in no acute distress HEENT: Normal NECK: No JVD; No carotid  bruits LYMPHATICS: No lymphadenopathy CARDIAC: RRR, no murmurs, rubs, gallops RESPIRATORY:  Clear to auscultation without rales, wheezing or rhonchi  ABDOMEN: Soft, non-tender, non-distended MUSCULOSKELETAL:  No edema; No deformity  SKIN: Warm and dry NEUROLOGIC:  Alert and oriented x 3 PSYCHIATRIC:  Normal affect   ASSESSMENT:    1. DOE (dyspnea on exertion)   2. Hyperlipidemia associated with type 2 diabetes mellitus (HCC)   3. Chest pain of uncertain etiology   4. Coronary artery disease involving native coronary artery of native heart without angina pectoris     PLAN:    Chest pain/DOE: Chest pain is atypical in description, but also having dyspnea/lightheadedness with exertion.  Echocardiogram on 09/30/2020 showed normal biventricular function, grade 1 diastolic dysfunction, no significant valvular disease.  Lexiscan Myoview on 09/30/2020 showed normal perfusion, EF 69%.    Hyperlipidemia: On rosuvastatin 10 mg daily.  LDL 82 on 07/18/2020.  Calcium score on 09/30/2020 was 213 (75th percentile), rosuvastatin dose increased to 20 mg  T2DM: on insulin.  Most recent A1c 6.5%  RTC in 1 year   Medication Adjustments/Labs and Tests Ordered: Current medicines are reviewed at length with the patient today.  Concerns regarding medicines are outlined above.  Orders Placed This Encounter  Procedures   Lipid panel   EKG 12-Lead    No orders of the defined types were placed in this encounter.    Patient Instructions  Medication Instructions:  Continue all medications *If you need a refill on your cardiac medications before your next appointment, please call your pharmacy*   Lab Work: Lipid panel today   Testing/Procedures: None ordered   Follow-Up: At [facility], you and your health needs are our priority.  As part of our continuing  mission to provide you with exceptional heart care, we have created designated Provider Care Teams.  These Care Teams include your primary  Cardiologist (physician) and Advanced Practice Providers (APPs -  Physician Assistants and Nurse Practitioners) who all work together to provide you with the care you need, when you need it.  We recommend signing up for the patient portal called "MyChart".  Sign up information is provided on this After Visit Summary.  MyChart is used to connect with patients for Virtual Visits (Telemedicine).  Patients are able to view lab/test results, encounter notes, upcoming appointments, etc.  Non-urgent messages can be sent to your provider as well.   To learn more about what you can do with MyChart, go to [email or web address].    Your next appointment:  1 year    The format for your next appointment:  Office   Provider:  Dr.Rosella Crandell     Signed, Donato Heinz, MD  02/28/2021 8:49 AM    [place]

## 2021-03-01 LAB — URINE CULTURE
MICRO NUMBER:: 12984935
SPECIMEN QUALITY:: ADEQUATE

## 2021-03-04 ENCOUNTER — Other Ambulatory Visit: Payer: Self-pay

## 2021-03-04 MED ORDER — EZETIMIBE 10 MG PO TABS: 10.0000 mg | ORAL_TABLET | Freq: Every day | ORAL | 3 refills | Status: DC

## 2021-03-12 ENCOUNTER — Encounter: Payer: Self-pay | Admitting: Cardiology

## 2021-03-13 ENCOUNTER — Ambulatory Visit (INDEPENDENT_AMBULATORY_CARE_PROVIDER_SITE_OTHER): Payer: Medicare Other | Admitting: Family Medicine

## 2021-03-13 ENCOUNTER — Encounter: Payer: Self-pay | Admitting: Family Medicine

## 2021-03-13 VITALS — BP 119/73 | HR 85 | Ht 64.0 in | Wt 136.0 lb

## 2021-03-13 DIAGNOSIS — G629 Polyneuropathy, unspecified: Secondary | ICD-10-CM

## 2021-03-13 DIAGNOSIS — G500 Trigeminal neuralgia: Secondary | ICD-10-CM | POA: Diagnosis not present

## 2021-03-13 DIAGNOSIS — M47812 Spondylosis without myelopathy or radiculopathy, cervical region: Secondary | ICD-10-CM

## 2021-03-13 MED ORDER — GABAPENTIN 300 MG PO CAPS: ORAL_CAPSULE | ORAL | 3 refills | Status: DC

## 2021-03-13 MED ORDER — AMITRIPTYLINE HCL 25 MG PO TABS: 25.0000 mg | ORAL_TABLET | Freq: Every day | ORAL | 3 refills | Status: DC

## 2021-03-13 NOTE — Progress Notes (Signed)
Chief Complaint  Patient presents with   Follow-up    Pt alone, rm 1. Following up today. Needs refill on medication. She states that she is concerned about what is going on in head when she has low sugars. She is having moments of this happening and just wanted to know the effects. Having some memory concerns but thinks age related    HISTORY OF PRESENT ILLNESS: 03/13/21 ALL: [name] [name] returns for follow up for headaches. She continues amitriptyline 1m and gabapentin 6037mBID. She reports TN pain has been well managed. She did have a littler more pain following cataract surgery but feels it is well managed. She is concerned about low blood sugar readings. She has insulin pump for DMT1. She has had generalized fatigue, weakness, mental fog and headaches with low readings. She usually feels poorly if CBG drops below 80. She has had a couple of readings in the 5044sShe reports readings have improved over the past year. She is followed closely by endo. Diabetic neuropathy is stable. She does continue to note some short term memory loss. She will forget what she went into a room for. She usually remembers once leaving the room. She is able to drive. She is able to perform ADLs independently. She manages her home and finances. She does have hearing aids. No family history of dementia.   11/15/2019 ALL:  [name] [name] a [age].o. female here today for follow up for headaches.  She continues amitriptyline 2515mnd gabapentin 300/300/600 for management of head and neck pain. Imaging shows cervical spondylosis. She reports that both headaches, neck and back pain, as well as neuralgia pain are all well managed on this combination. She has felt more anxiety recently due to worsening diabetes. She is now on basil and meal coverage insulin. She is working closely with PCP. She had lumbar surgery last year and has recovered well. She does feel that she has some word finding concerns from time to time. She feels  overwhelmed with new insulin regimen. She is very active. Loves to read. She participates in church groups.   HISTORY (copied from Dr WilJannifer Franklinote on 11/15/2018)  [name] [name] is a 76 32ar old right-handed white female with a history of a headache syndrome that is associated with sharp jabbing pain coming up from the right side of the neck into the right frontotemporal area.  This may occur on average once or twice a week.  The patient is on amitriptyline and gabapentin, she was on propranolol but stopped this medication.  She has diabetes and is currently on insulin, she does have a peripheral neuropathy associated with this.  She recently fell in May 2020 and injured her low back which required 2 surgical procedures, one on 25 July 2018 and the second on 20 September 2018.  The patient had severe sciatica on involving the right leg with weakness of the right leg.  She is now getting over her second surgery, she is in physical therapy.  She is wearing a low back brace.  She at some point wishes to come off of the amitriptyline but she is still having a lot of back pain and muscle spasm.  She remains on gabapentin, she tolerates this fairly well.   REVIEW OF SYSTEMS: Out of a complete 14 system review of symptoms, the patient complains only of the following symptoms, headaches, neck pain, back pain, neuropathy, anxiety and all other reviewed systems are negative.   ALLERGIES: Allergies  Allergen Reactions  Rocephin [Ceftriaxone] Anaphylaxis   Prednisone Other (See Comments)    High blood sugars (pt reports)   Ibandronic Acid Hives   Other     Can't use adhesive tape, VERY DELICATE SKIN FROM MELANOMA    Tape    Tramadol Hcl Hives   Benadryl [Diphenhydramine Hcl] Anxiety    Hyperactivity   Repaglinide Rash   Sitagliptin Rash     HOME MEDICATIONS: Outpatient Medications Prior to Visit  Medication Sig Dispense Refill   ALPRAZolam (XANAX) 0.5 MG tablet TAKE 1 TABLET(0.5 MG) BY MOUTH AT  BEDTIME AS NEEDED FOR SLEEP 30 tablet 3   amitriptyline (ELAVIL) 25 MG tablet TAKE 1 TABLET(25 MG) BY MOUTH AT BEDTIME 90 tablet 3   aspirin 81 MG EC tablet Take 1 tablet (81 mg total) by mouth daily. Swallow whole. 30 tablet 12   baclofen (LIORESAL) 10 MG tablet Take 10 mg by mouth 2 (two) times daily. May take TID is needed     Cholecalciferol (VITAMIN D3) 2000 UNITS TABS Take 1 tablet by mouth daily.     Continuous Blood Gluc Receiver (FREESTYLE LIBRE READER) DEVI by Does not apply route.     diclofenac Sodium (VOLTAREN) 1 % GEL Apply 2 g topically as needed.     ezetimibe (ZETIA) 10 MG tablet Take 1 tablet (10 mg total) by mouth daily. 90 tablet 3   famotidine (PEPCID) 20 MG tablet TAKE 1 TABLET(20 MG) BY MOUTH AT BEDTIME 90 tablet 1   gabapentin (NEURONTIN) 300 MG capsule TAKE TWO CAPSULES IN THE MORNING AND 2 CAPSULES AT BEDTIME 360 capsule 0   hyoscyamine (LEVSIN SL) 0.125 MG SL tablet Take 0.125 mg by mouth as needed. 368 tablet 1   Insulin Disposable Pump (OMNIPOD DASH INTRO, GEN 4,) KIT by Does not apply route. Change q 3 days     levothyroxine (SYNTHROID, LEVOTHROID) 50 MCG tablet Take 50 mcg by mouth daily before breakfast. Take 69mg M-F and 1032m on Sat and Sun  12   Lifitegrast 5 % SOLN Apply 1 each to eye as needed.     Melatonin 5 MG TABS Take 1 tablet by mouth at bedtime.     miconazole (MONISTAT 7) 2 % vaginal cream Apply cream externally to vagina where irritated daily 45 g 0   Multiple Vitamin (MULTIVITAMIN) tablet Take 1 tablet by mouth daily.     pantoprazole (PROTONIX) 40 MG tablet TAKE 1 TABLET(40 MG) BY MOUTH DAILY 90 tablet 1   Peppermint Oil (IBGARD) 90 MG CPCR Take as directed as needed     polyethylene glycol (MIRALAX / GLYCOLAX) 17 g packet Take 34 g by mouth daily.     Probiotic Product (PROBIOTIC PO) Take 1 tablet by mouth daily.     rosuvastatin (CRESTOR) 20 MG tablet Take 1 tablet (20 mg total) by mouth daily. 90 tablet 3   sulfamethoxazole-trimethoprim  (BACTRIM DS) 800-160 MG tablet Take 1 tablet by mouth 2 (two) times daily. 6 tablet 0   No facility-administered medications prior to visit.     PAST MEDICAL HISTORY: Past Medical History:  Diagnosis Date   Abdominal pain    EGD and CTs neg, rx Amitriptiline (non ulcer dyspepsia) EGD 12/11.. s/p ballon dilitation   Anxiety    Cataract    Cervical spondylosis without myelopathy 11/30/2012   Chronic conjunctivitis    Diabetes mellitus    no meds, diet controlled   Diverticulosis of colon    GERD with stricture    s/p dilatations  History of Lyme disease    HOH (hard of hearing)    bilateral hearing aids   Hypercholesterolemia    Hyperlipidemia    Hypothyroidism     Dx 10-9 TSH 5.2 , then TSH normalized, eventually went up again 2012   Melanoma [facility])    h/o   Memory difficulties 06/05/2014   Osteopenia    Osteoporosis    Solar lentigo    face; s/p excision 3/09. See Derm q 6 months   Trigeminal neuralgia    h/o      PAST SURGICAL HISTORY: Past Surgical History:  Procedure Laterality Date   ABDOMINAL HYSTERECTOMY     ANKLE SURGERY  07/01/2015   R andkle tendon surgery   BREAST BIOPSY Left 1988   BREAST EXCISIONAL BIOPSY Left 1988   CATARACT EXTRACTION Bilateral    CERVICAL FUSION  06/2017   CHOLECYSTECTOMY     COLON RESECTION  1999   d/t diverticulitis   COLON SURGERY     COLONOSCOPY     ESOPHAGEAL MANOMETRY N/A 03/17/2019   Procedure: ESOPHAGEAL MANOMETRY (EM)  NO PH;  Surgeon: Lavena Bullion, DO;  Location: [facility];  Service: Gastroenterology;  Laterality: N/A;   LUMBAR DISC SURGERY  07/25/18, 09/20   melanoma resection Right     Right Arm Surgery, R facial surgery (05-2012)      PTX s/t chest tube     SHOULDER SURGERY     right   UPPER GASTROINTESTINAL ENDOSCOPY     WRIST SURGERY     removed melaoma, right     FAMILY HISTORY: Family History  Problem Relation Age of Onset   Prostate cancer Father    Endometrial cancer Sister        clear  cell carcinoma   Stroke Sister    Heart attack Paternal Grandfather        GF in his 26s   Melanoma Maternal Uncle    Colon cancer Neg Hx    Breast cancer Neg Hx    Diabetes Neg Hx    Stomach cancer Neg Hx    Rectal cancer Neg Hx    Esophageal cancer Neg Hx      SOCIAL HISTORY: Social History   Socioeconomic History   Marital status: Married    Spouse name: Percell Miller   Number of children: 2   Years of education: college   Highest education level: Not on file  Occupational History   Occupation: retired    Fish farm manager: RETIRED  Tobacco Use   Smoking status: Former    Types: Cigarettes    Start date: 01/20/1976    Quit date: 1981    Years since quitting: 42.1   Smokeless tobacco: Never  Vaping Use   Vaping Use: Never used  Substance and Sexual Activity   Alcohol use: Not Currently   Drug use: No   Sexual activity: Not Currently  Other Topics Concern   Not on file  Social History Narrative   Patient is married with 2 children.   Patient is right handed.   Patient has college education.   Patient does not drink caffeine.          Social Determinants of Health   Financial Resource Strain: Not on file  Food Insecurity: Not on file  Transportation Needs: Not on file  Physical Activity: Not on file  Stress: Not on file  Social Connections: Not on file  Intimate Partner Violence: Not on file      PHYSICAL EXAM  Vitals:   03/13/21 0830  BP: 119/73  Pulse: 85  Weight: 136 lb (61.7 kg)  Height: _0  (1.626 m)    Body mass index is 23.34 kg/m.   Generalized: Well developed, in no acute distress   Neurological examination  Mentation: Alert oriented to time, place, history taking. Follows all commands speech and language fluent Cranial nerve II-XII: Pupils were equal round reactive to light. Extraocular movements were full, visual field were full on confrontational test. Facial sensation and strength were normal. Head turning and shoulder shrug  were normal  and symmetric. Motor: The motor testing reveals 5 over 5 strength of all 4 extremities. Good symmetric motor tone is noted throughout.  Sensory: Sensory testing is intact to soft touch on all 4 extremities. No evidence of extinction is noted.  Coordination: Cerebellar testing reveals good finger-nose-finger and heel-to-shin bilaterally.  Gait and station: Gait is normal. Tandem gait is normal. Romberg is negative. No drift is seen.  Reflexes: Deep tendon reflexes are symmetric and normal bilaterally.     DIAGNOSTIC DATA (LABS, IMAGING, TESTING) - I reviewed patient records, labs, notes, testing and imaging myself where available.  Lab Results  Component Value Date   WBC 7.6 06/21/2020   HGB 13.6 06/21/2020   HCT 39.9 06/21/2020   MCV 90.3 06/21/2020   PLT 243 06/21/2020      Component Value Date/Time   NA 139 12/24/2020 1054   NA 143 03/12/2014 0000   NA 143 03/12/2014 0000   K 4.5 12/24/2020 1054   CL 101 12/24/2020 1054   CO2 30 12/24/2020 1054   GLUCOSE 124 (H) 12/24/2020 1054   GLUCOSE 126 (H) 11/11/2005 0831   BUN 34 (H) 12/24/2020 1054   BUN 27 (A) 03/12/2014 0000   CREATININE 0.90 12/24/2020 1054   CREATININE 0.97 (H) 06/21/2020 1508   CALCIUM 9.7 12/24/2020 1054   PROT 7.0 12/24/2020 1054   ALBUMIN 4.4 12/24/2020 1054   AST 18 12/24/2020 1054   ALT 13 12/24/2020 1054   ALKPHOS 71 12/24/2020 1054   BILITOT 0.4 12/24/2020 1054   GFRNONAA >60 06/03/2020 0851   GFRAA >60 02/10/2019 1817   Lab Results  Component Value Date   CHOL 170 02/28/2021   HDL 84 02/28/2021   LDLCALC 76 02/28/2021   LDLDIRECT 148.1 06/11/2006   TRIG 46 02/28/2021   CHOLHDL 2.0 02/28/2021   Lab Results  Component Value Date   HGBA1C 7.1 04/22/2020   Lab Results  Component Value Date   KDXIPJAS50 539 06/05/2014   Lab Results  Component Value Date   TSH 4.84 (H) 03/03/2019      ASSESSMENT AND PLAN  76 y.o. year old female  has a past medical history of Abdominal pain,  Anxiety, Cataract, Cervical spondylosis without myelopathy (11/30/2012), Chronic conjunctivitis, Diabetes mellitus, Diverticulosis of colon, GERD with stricture, History of Lyme disease, HOH (hard of hearing), Hypercholesterolemia, Hyperlipidemia, Hypothyroidism, Melanoma ([place]), Memory difficulties (06/05/2014), Osteopenia, Osteoporosis, Solar lentigo, and Trigeminal neuralgia. here with   Cervical spondylosis without myelopathy  Annelyse is doing well, today.  She feels that headaches, neck and back pain as well as neuropathy is well managed on amitriptyline 25 mg at bedtime and gabapentin 600 mg in the morning and 600 mg in the evening.  We will continue current treatment plan.  We have discussed concerns of word finding difficulty.  I have reassured her that it does not sound like a neurodegenerative form of memory loss. She is completed A&O. Neuro exam intact.  She is able to manage her home, drive without difficulty and take care of her church community. May consider baseline formal neurocognitive testing if she wishes. She will discuss with PCP. Memory compensation strategies reviewed. She will continue to follow closely with primary care and endocrinology. Healthy lifestyle habits encouraged. She will follow up with me in 1 year.    Debbora Presto, MSN, FNP-C 03/13/2021, 8:34 AM  [facility] [address] [place], [place] [address] [phone]

## 2021-03-13 NOTE — Patient Instructions (Signed)
Below is our plan:  We will continue gabapentin 600mg  twice daily and amitriptyline 25mg  at bedtime.   Please make sure you are staying well hydrated. I recommend 50-60 ounces daily. Well balanced diet and regular exercise encouraged. Consistent sleep schedule with 6-8 hours recommended.   Please continue follow up with care team as directed.   Follow up with me in 1 year   You may receive a survey regarding today's visit. I encourage you to leave honest feed back as I do use this information to improve patient care. Thank you for seeing me today!   Management of Memory Problems   There are some general things you can do to help manage your memory problems.  Your memory may not in fact recover, but by using techniques and strategies you will be able to manage your memory difficulties better.   1)  Establish a routine. Try to establish and then stick to a regular routine.  By doing this, you will get used to what to expect and you will reduce the need to rely on your memory.  Also, try to do things at the same time of day, such as taking your medication or checking your calendar first thing in the morning. Think about think that you can do as a part of a regular routine and make a list.  Then enter them into a daily planner to remind you.  This will help you establish a routine.   2)  Organize your environment. Organize your environment so that it is uncluttered.  Decrease visual stimulation.  Place everyday items such as keys or cell phone in the same place every day (ie.  Basket next to front door) Use post it notes with a brief message to yourself (ie. Turn off light, lock the door) Use labels to indicate where things go (ie. Which cupboards are for food, dishes, etc.) Keep a notepad and pen by the telephone to take messages   3)  Memory Aids A diary or journal/notebook/daily planner Making a list (shopping list, chore list, to do list that needs to be done) Using an alarm as a  reminder (kitchen timer or cell phone alarm) Using cell phone to store information (Notes, Calendar, Reminders) Calendar/White board placed in a prominent position Post-it notes   In order for memory aids to be useful, you need to have good habits.  It's no good remembering to make a note in your journal if you don't remember to look in it.  Try setting aside a certain time of day to look in journal.   4)  Improving mood and managing fatigue. There may be other factors that contribute to memory difficulties.  Factors, such as anxiety, depression and tiredness can affect memory. Regular gentle exercise can help improve your mood and give you more energy. Simple relaxation techniques may help relieve symptoms of anxiety Try to get back to completing activities or hobbies you enjoyed doing in the past. Learn to pace yourself through activities to decrease fatigue. Find out about some local support groups where you can share experiences with others. Try and achieve 7-8 hours of sleep at night.

## 2021-03-23 ENCOUNTER — Other Ambulatory Visit: Payer: Self-pay

## 2021-03-23 ENCOUNTER — Emergency Department (HOSPITAL_BASED_OUTPATIENT_CLINIC_OR_DEPARTMENT_OTHER)
Admission: EM | Admit: 2021-03-23 | Discharge: 2021-03-23 | Disposition: A | Discharge status: Home / Self Care | Payer: Medicare Other | Attending: Emergency Medicine | Admitting: Emergency Medicine

## 2021-03-23 ENCOUNTER — Encounter (HOSPITAL_BASED_OUTPATIENT_CLINIC_OR_DEPARTMENT_OTHER): Payer: Self-pay | Admitting: *Deleted

## 2021-03-23 DIAGNOSIS — E119 Type 2 diabetes mellitus without complications: Secondary | ICD-10-CM | POA: Diagnosis not present

## 2021-03-23 DIAGNOSIS — Z794 Long term (current) use of insulin: Secondary | ICD-10-CM | POA: Diagnosis not present

## 2021-03-23 DIAGNOSIS — Z87891 Personal history of nicotine dependence: Secondary | ICD-10-CM | POA: Diagnosis not present

## 2021-03-23 DIAGNOSIS — Z7982 Long term (current) use of aspirin: Secondary | ICD-10-CM | POA: Diagnosis not present

## 2021-03-23 DIAGNOSIS — M109 Gout, unspecified: Secondary | ICD-10-CM | POA: Insufficient documentation

## 2021-03-23 DIAGNOSIS — E039 Hypothyroidism, unspecified: Secondary | ICD-10-CM | POA: Insufficient documentation

## 2021-03-23 DIAGNOSIS — M79672 Pain in left foot: Secondary | ICD-10-CM | POA: Diagnosis present

## 2021-03-23 MED ORDER — CELECOXIB 200 MG PO CAPS: ORAL_CAPSULE | ORAL | 0 refills | Status: DC

## 2021-03-23 MED ORDER — COLCHICINE 0.6 MG PO TABS: ORAL_TABLET | ORAL | 0 refills | Status: DC

## 2021-03-23 MED ORDER — COLCHICINE 0.6 MG PO TABS
1.2000 mg | ORAL_TABLET | Freq: Once | ORAL | Status: AC
  Administered 2021-03-23: 1.2 mg via ORAL
  Filled 2021-03-23: qty 2

## 2021-03-23 MED ORDER — COLCHICINE 0.6 MG PO TABS
0.6000 mg | ORAL_TABLET | Freq: Once | ORAL | Status: AC
  Administered 2021-03-23: 0.6 mg via ORAL
  Filled 2021-03-23: qty 1

## 2021-03-23 NOTE — ED Provider Notes (Signed)
[facility] Provider Note: Georgena Spurling, MD, FACEP  CSN: [id number] MRN: [id number] ARRIVAL: 03/23/21 at [place]: [facility]   HISTORY OF PRESENT ILLNESS  03/23/21 6:22 AM [name] [name] is a 76 y.o. female with no personal history of gout but his mother had gout.  She is here with pain in her left great toe metatarsophalangeal joint that began yesterday morning.  There was no trauma.  Pain is worse with movement or palpation and she rates it as a 10 out of 10.  There is some swelling of that joint but no erythema or warmth.   Past Medical History:  Diagnosis Date   Abdominal pain    EGD and CTs neg, rx Amitriptiline (non ulcer dyspepsia) EGD 12/11.. s/p ballon dilitation   Anxiety    Cataract    Cervical spondylosis without myelopathy 11/30/2012   Chronic conjunctivitis    Diabetes mellitus    no meds, diet controlled   Diverticulosis of colon    GERD with stricture    s/p dilatations   History of Lyme disease    HOH (hard of hearing)    bilateral hearing aids   Hypercholesterolemia    Hyperlipidemia    Hypothyroidism     Dx 10-9 TSH 5.2 , then TSH normalized, eventually went up again 2012   Melanoma [facility])    h/o   Memory difficulties 06/05/2014   Osteopenia    Osteoporosis    Solar lentigo    face; s/p excision 3/09. See Derm q 6 months   Trigeminal neuralgia    h/o     Past Surgical History:  Procedure Laterality Date   ABDOMINAL HYSTERECTOMY     ANKLE SURGERY  07/01/2015   R andkle tendon surgery   BREAST BIOPSY Left 1988   BREAST EXCISIONAL BIOPSY Left 1988   CATARACT EXTRACTION Bilateral    CERVICAL FUSION  06/2017   CHOLECYSTECTOMY     COLON RESECTION  1999   d/t diverticulitis   COLON SURGERY     COLONOSCOPY     ESOPHAGEAL MANOMETRY N/A 03/17/2019   Procedure: ESOPHAGEAL MANOMETRY (EM)  NO PH;  Surgeon: Lavena Bullion, DO;  Location: [facility];  Service: Gastroenterology;  Laterality: N/A;   LUMBAR  DISC SURGERY  07/25/18, 09/20   melanoma resection Right     Right Arm Surgery, R facial surgery (05-2012)      PTX s/t chest tube     SHOULDER SURGERY     right   UPPER GASTROINTESTINAL ENDOSCOPY     WRIST SURGERY     removed melaoma, right    Family History  Problem Relation Age of Onset   Prostate cancer Father    Endometrial cancer Sister        clear cell carcinoma   Stroke Sister    Heart attack Paternal Grandfather        GF in his 58s   Melanoma Maternal Uncle    Colon cancer Neg Hx    Breast cancer Neg Hx    Diabetes Neg Hx    Stomach cancer Neg Hx    Rectal cancer Neg Hx    Esophageal cancer Neg Hx     Social History   Tobacco Use   Smoking status: Former    Types: Cigarettes    Start date: 01/20/1976    Quit date: 1981    Years since quitting: 42.2   Smokeless tobacco: Never  Vaping Use  Vaping Use: Never used  Substance Use Topics   Alcohol use: Not Currently   Drug use: No    Prior to Admission medications   Medication Sig Start Date End Date Taking? Authorizing Provider  celecoxib (CELEBREX) 200 MG capsule Take 1 capsule twice daily with food as needed for gout pain. 03/23/21  Yes Jhaden Pizzuto, MD  colchicine 0.6 MG tablet For a gout flare take 2 tablets at 1 time, then take 1/3 tablet 1 hour later.  Repeat every 3 days until gout flare subsides. 03/23/21  Yes Nicolae Vasek, MD  ALPRAZolam Duanne Moron) 0.5 MG tablet TAKE 1 TABLET(0.5 MG) BY MOUTH AT BEDTIME AS NEEDED FOR SLEEP 02/25/21   Midge Minium, MD  amitriptyline (ELAVIL) 25 MG tablet Take 1 tablet (25 mg total) by mouth at bedtime. 03/13/21   Lomax, Amy, NP  aspirin 81 MG EC tablet Take 1 tablet (81 mg total) by mouth daily. Swallow whole. 06/21/20   Midge Minium, MD  baclofen (LIORESAL) 10 MG tablet Take 10 mg by mouth 2 (two) times daily. May take TID is needed    [provider]  Cholecalciferol (VITAMIN D3) 2000 UNITS TABS Take 1 tablet by mouth daily.    [provider]   Continuous Blood Gluc Receiver (FREESTYLE LIBRE READER) DEVI by Does not apply route.    [provider]  diclofenac Sodium (VOLTAREN) 1 % GEL Apply 2 g topically as needed.    [provider]  ezetimibe (ZETIA) 10 MG tablet Take 1 tablet (10 mg total) by mouth daily. 03/04/21   Donato Heinz, MD  famotidine (PEPCID) 20 MG tablet TAKE 1 TABLET(20 MG) BY MOUTH AT BEDTIME 10/31/20   Kennedy-Smith, Patrecia Pour, NP  gabapentin (NEURONTIN) 300 MG capsule TAKE TWO CAPSULES IN THE MORNING AND 2 CAPSULES AT BEDTIME 03/13/21   Lomax, Amy, NP  hyoscyamine (LEVSIN SL) 0.125 MG SL tablet Take 0.125 mg by mouth as needed. 01/04/20   Armbruster, Carlota Raspberry, MD  Insulin Disposable Pump (OMNIPOD DASH INTRO, GEN 4,) KIT by Does not apply route. Change q 3 days    [provider]  levothyroxine (SYNTHROID, LEVOTHROID) 50 MCG tablet Take 50 mcg by mouth daily before breakfast. Take 30mg M-F and 1019m on Sat and Sun 03/02/16   [provider]  Lifitegrast 5 % SOLN Apply 1 each to eye as needed.    [provider]  Melatonin 5 MG TABS Take 1 tablet by mouth at bedtime.    [provider]  miconazole (MONISTAT 7) 2 % vaginal cream Apply cream externally to vagina where irritated daily 02/26/21   TaMidge MiniumMD  Multiple Vitamin (MULTIVITAMIN) tablet Take 1 tablet by mouth daily.    [provider]  pantoprazole (PROTONIX) 40 MG tablet TAKE 1 TABLET(40 MG) BY MOUTH DAILY 10/31/20   TaMidge MiniumMD  Peppermint Oil (IBGARD) 90 MG CPCR Take as directed as needed 04/01/20   Armbruster, StCarlota RaspberryMD  polyethylene glycol (MIRALAX / GLYCOLAX) 17 g packet Take 34 g by mouth daily.    [provider]  Probiotic Product (PROBIOTIC PO) Take 1 tablet by mouth daily.    [provider]  rosuvastatin (CRESTOR) 20 MG tablet Take 1 tablet (20 mg total) by mouth daily. 10/01/20   ScDonato HeinzMD    Allergies Rocephin  [ceftriaxone], Prednisone, Ibandronic acid, Other, Tape, Tramadol hcl, Benadryl [diphenhydramine hcl], Repaglinide, and Sitagliptin   REVIEW OF SYSTEMS  Negative except  as noted here or in the History of Present Illness.   PHYSICAL EXAMINATION  Initial Vital Signs Blood pressure (!) 149/80, pulse 88, temperature 97.7 F (36.5 C), temperature source Oral, resp. rate 16, height _0  (1.626 m), weight 61.7 kg, SpO2 100 %.  Examination General: Well-developed, well-nourished female in no acute distress; appearance consistent with age of record HENT: normocephalic; atraumatic Eyes: Normal appearance Neck: supple Heart: regular rate and rhythm Lungs: clear to auscultation bilaterally Abdomen: soft; nondistended; nontender; bowel sounds present Extremities: No acute deformity; tenderness and swelling of left first metatarsophalangeal joint without erythema or warmth Neurologic: Awake, alert and oriented; motor function intact in all extremities and symmetric; no facial droop Skin: Warm and dry Psychiatric: Normal mood and affect   RESULTS  Summary of this visit's results, reviewed and interpreted by myself:   EKG Interpretation  Date/Time:    Ventricular Rate:    PR Interval:    QRS Duration:   QT Interval:    QTC Calculation:   R Axis:     Text Interpretation:         Laboratory Studies: No results found for this or any previous visit (from the past 24 hour(s)). Imaging Studies: No results found.  ED COURSE and MDM  Nursing notes, initial and subsequent vitals signs, including pulse oximetry, reviewed and interpreted by myself.  Vitals:   03/23/21 0610 03/23/21 0611  BP: (!) 149/80   Pulse: 88   Resp: 16   Temp: 97.7 F (36.5 C)   TempSrc: Oral   SpO2: 100%   Weight:  61.7 kg  Height:  _1  (1.626 m)   Medications  colchicine tablet 1.2 mg (has no administration in time range)  colchicine tablet 0.6 mg (has no administration in time range)   Patient's  presentation is most consistent with acute podagra, gout of the first metatarsophalangeal joint.  We will treat with colchicine and an NSAID.   PROCEDURES  Procedures   ED DIAGNOSES     ICD-10-CM   1. Podagra  M10.9          Lorita Forinash, Jenny Reichmann, MD 03/23/21 [phone]

## 2021-03-23 NOTE — ED Triage Notes (Signed)
Pt is here due to left foot pain which began yesterday morning without any trauma.  Pt has pain on ball of foot and under great toe.  Foot appears wnl, pt arrives ambulatory with a limp. ?

## 2021-03-27 ENCOUNTER — Ambulatory Visit (INDEPENDENT_AMBULATORY_CARE_PROVIDER_SITE_OTHER): Payer: Medicare Other | Admitting: Family Medicine

## 2021-03-27 ENCOUNTER — Encounter: Payer: Self-pay | Admitting: Family Medicine

## 2021-03-27 VITALS — BP 122/60 | HR 89 | Temp 97.6°F | Resp 16 | Wt 139.6 lb

## 2021-03-27 DIAGNOSIS — M10071 Idiopathic gout, right ankle and foot: Secondary | ICD-10-CM | POA: Diagnosis not present

## 2021-03-27 DIAGNOSIS — I2510 Atherosclerotic heart disease of native coronary artery without angina pectoris: Secondary | ICD-10-CM | POA: Diagnosis not present

## 2021-03-27 LAB — URIC ACID: Uric Acid, Serum: 3.9 mg/dL (ref 2.4–7.0)

## 2021-03-27 NOTE — Progress Notes (Signed)
? ?  Subjective:  ? ? Patient ID: [name] [name], female    DOB: [birth year], 76 y.o.   MRN: [id number] ? ?HPI ?ER f/u- pt was seen on 3/5 and dx'd w/ gout in R great toe.  She was prescribed Colchicine and Celebrex.  Pt reports pain is gone today.  Did not require the colchicine yesterday.  Pt is here today to get Uric acid level.  Pt reports she does not eat a diet high in purines.  Mother suffered from gout ? ? ?Review of Systems ?For ROS see HPI  ? ?This visit occurred during the SARS-CoV-2 public health emergency.  Safety protocols were in place, including screening questions prior to the visit, additional usage of staff PPE, and extensive cleaning of exam room while observing appropriate contact time as indicated for disinfecting solutions.   ?   ?Objective:  ? Physical Exam ?Vitals reviewed.  ?Constitutional:   ?   Appearance: Normal appearance. She is not ill-appearing.  ?Cardiovascular:  ?   Pulses: Normal pulses.  ?Musculoskeletal:     ?   General: No swelling or tenderness.  ?Skin: ?   General: Skin is warm and dry.  ?   Findings: No erythema.  ?Neurological:  ?   Mental Status: She is alert.  ? ? ? ? ? ?   ?Assessment & Plan:  ?Gout- new.  Thankfully acute attack has resolved.  Pt has no swelling, redness, or TTP.  Reviewed low purine diet, need to stay hydrated, and how to treat possible future episodes.  Check uric acid to determine if allopurinol needed.  Pt expressed understanding and is in agreement w/ plan.  ? ?

## 2021-03-27 NOTE — Patient Instructions (Signed)
Follow up as needed or as scheduled ?We'll notify you of your lab results and make any changes if needed ?Continue to eat a low purine diet ?Drink LOTS of fluids ?Call with any questions or concerns ?[place]!!! ? ?Gout ?Gout is a condition that causes painful swelling of the joints. Gout is a type of inflammation of the joints (arthritis). This condition is caused by having too much uric acid in the body. Uric acid is a chemical that forms when the body breaks down substances called purines. Purines are important for building body proteins. ?When the body has too much uric acid, sharp crystals can form and build up inside the joints. This causes pain and swelling. Gout attacks can happen quickly and may be very painful (acute gout). Over time, the attacks can affect more joints and become more frequent (chronic gout). Gout can also cause uric acid to build up under the skin and inside the kidneys. ?What are the causes? ?This condition is caused by too much uric acid in your blood. This can happen because: ?Your kidneys do not remove enough uric acid from your blood. This is the most common cause. ?Your body makes too much uric acid. This can happen with some cancers and cancer treatments. It can also occur if your body is breaking down too many red blood cells (hemolytic anemia). ?You eat too many foods that are high in purines. These foods include organ meats and some seafood. Alcohol, especially beer, is also high in purines. ?A gout attack may be triggered by trauma or stress. ?What increases the risk? ?You are more likely to develop this condition if you: ?Have a family history of gout. ?Are female and middle-aged. ?Are female and have gone through menopause. ?Are obese. ?Frequently drink alcohol, especially beer. ?Are dehydrated. ?Lose weight too quickly. ?Have an organ transplant. ?Have lead poisoning. ?Take certain medicines, including aspirin, cyclosporine, diuretics, levodopa, and niacin. ?Have kidney  disease. ?Have a skin condition called psoriasis. ?What are the signs or symptoms? ?An attack of acute gout happens quickly. It usually occurs in just one joint. The most common place is the big toe. Attacks often start at night. Other joints that may be affected include joints of the feet, ankle, knee, fingers, wrist, or elbow. Symptoms of this condition may include: ?Severe pain. ?Warmth. ?Swelling. ?Stiffness. ?Tenderness. The affected joint may be very painful to touch. ?Shiny, red, or purple skin. ?Chills and fever. ?Chronic gout may cause symptoms more frequently. More joints may be involved. You may also have white or yellow lumps (tophi) on your hands or feet or in other areas near your joints. ?How is this diagnosed? ?This condition is diagnosed based on your symptoms, medical history, and physical exam. You may have tests, such as: ?Blood tests to measure uric acid levels. ?Removal of joint fluid with a thin needle (aspiration) to look for uric acid crystals. ?X-rays to look for joint damage. ?How is this treated? ?Treatment for this condition has two phases: treating an acute attack and preventing future attacks. Acute gout treatment may include medicines to reduce pain and swelling, including: ?NSAIDs. ?Steroids. These are strong anti-inflammatory medicines that can be taken by mouth (orally) or injected into a joint. ?Colchicine. This medicine relieves pain and swelling when it is taken soon after an attack. It can be given by mouth or through an IV. ?Preventive treatment may include: ?Daily use of smaller doses of NSAIDs or colchicine. ?Use of a medicine that reduces uric acid levels in  your blood. ?Changes to your diet. You may need to see a dietitian about what to eat and drink to prevent gout. ?Follow these instructions at home: ?During a gout attack ? ?If directed, put ice on the affected area: ?Put ice in a plastic bag. ?Place a towel between your skin and the bag. ?Leave the ice on for 20  minutes, 2-3 times a day. ?Raise (elevate) the affected joint above the level of your heart as often as possible. ?Rest the joint as much as possible. If the affected joint is in your leg, you may be given crutches to use. ?Follow instructions from your health care provider about eating or drinking restrictions. ?Avoiding future gout attacks ?Follow a low-purine diet as told by your dietitian or health care provider. Avoid foods and drinks that are high in purines, including liver, kidney, anchovies, asparagus, herring, mushrooms, mussels, and beer. ?Maintain a healthy weight or lose weight if you are overweight. If you want to lose weight, talk with your health care provider. It is important that you do not lose weight too quickly. ?Start or maintain an exercise program as told by your health care provider. ?Eating and drinking ?Drink enough fluids to keep your urine pale yellow. ?If you drink alcohol: ?Limit how much you use to: ?0-1 drink a day for women. ?0-2 drinks a day for men. ?Be aware of how much alcohol is in your drink. In the [place] one drink equals one 12 oz bottle of beer (355 mL) one 5 oz glass of wine (148 mL), or one 1? oz glass of hard liquor (44 mL). ?General instructions ?Take over-the-counter and prescription medicines only as told by your health care provider. ?Do not drive or use heavy machinery while taking prescription pain medicine. ?Return to your normal activities as told by your health care provider. Ask your health care provider what activities are safe for you. ?Keep all follow-up visits as told by your health care provider. This is important. ?Contact a health care provider if you have: ?Another gout attack. ?Continuing symptoms of a gout attack after 10 days of treatment. ?Side effects from your medicines. ?Chills or a fever. ?Burning pain when you urinate. ?Pain in your lower back or belly. ?Get help right away if you: ?Have severe or uncontrolled pain. ?Cannot  urinate. ?Summary ?Gout is painful swelling of the joints caused by inflammation. ?The most common site of pain is the big toe, but it can affect other joints in the body. ?Medicines and dietary changes can help to prevent and treat gout attacks. ?This information is not intended to replace advice given to you by your health care provider. Make sure you discuss any questions you have with your health care provider. ?Document Revised: 07/16/2017 Document Reviewed: 07/28/2017 ?Elsevier Patient Education ? [place]. ? ?

## 2021-04-23 ENCOUNTER — Other Ambulatory Visit: Payer: Self-pay | Admitting: Family Medicine

## 2021-04-28 ENCOUNTER — Other Ambulatory Visit: Payer: Self-pay | Admitting: Nurse Practitioner

## 2021-04-28 DIAGNOSIS — E1065 Type 1 diabetes mellitus with hyperglycemia: Secondary | ICD-10-CM | POA: Diagnosis not present

## 2021-05-06 DIAGNOSIS — Z85820 Personal history of malignant melanoma of skin: Secondary | ICD-10-CM | POA: Diagnosis not present

## 2021-05-06 DIAGNOSIS — D2272 Melanocytic nevi of left lower limb, including hip: Secondary | ICD-10-CM | POA: Diagnosis not present

## 2021-05-06 DIAGNOSIS — D2271 Melanocytic nevi of right lower limb, including hip: Secondary | ICD-10-CM | POA: Diagnosis not present

## 2021-05-06 DIAGNOSIS — D1801 Hemangioma of skin and subcutaneous tissue: Secondary | ICD-10-CM | POA: Diagnosis not present

## 2021-05-06 DIAGNOSIS — D225 Melanocytic nevi of trunk: Secondary | ICD-10-CM | POA: Diagnosis not present

## 2021-05-06 DIAGNOSIS — L570 Actinic keratosis: Secondary | ICD-10-CM | POA: Diagnosis not present

## 2021-05-06 DIAGNOSIS — L812 Freckles: Secondary | ICD-10-CM | POA: Diagnosis not present

## 2021-06-03 ENCOUNTER — Encounter: Payer: Self-pay | Admitting: Family Medicine

## 2021-06-03 ENCOUNTER — Ambulatory Visit (INDEPENDENT_AMBULATORY_CARE_PROVIDER_SITE_OTHER): Payer: Medicare Other | Admitting: Family Medicine

## 2021-06-03 VITALS — BP 118/60 | HR 80 | Temp 98.3°F | Resp 16 | Ht 64.0 in | Wt 137.4 lb

## 2021-06-03 DIAGNOSIS — S80862A Insect bite (nonvenomous), left lower leg, initial encounter: Secondary | ICD-10-CM | POA: Diagnosis not present

## 2021-06-03 DIAGNOSIS — W57XXXA Bitten or stung by nonvenomous insect and other nonvenomous arthropods, initial encounter: Secondary | ICD-10-CM

## 2021-06-03 DIAGNOSIS — I2510 Atherosclerotic heart disease of native coronary artery without angina pectoris: Secondary | ICD-10-CM

## 2021-06-03 MED ORDER — DOXYCYCLINE HYCLATE 100 MG PO TABS: 100.0000 mg | ORAL_TABLET | Freq: Two times a day (BID) | ORAL | 0 refills | Status: DC

## 2021-06-03 NOTE — Patient Instructions (Signed)
Follow up as needed or as scheduled ?START the Doxycycline twice daily- take w/ food ?Call with any questions or concerns ?Stay Safe!  Stay Healthy! ?Have a great summer!!! ?

## 2021-06-03 NOTE — Progress Notes (Signed)
? ?  Subjective:  ? ? Patient ID: [name] [name], female    DOB: Jul 17, 1945, 76 y.o.   MRN: [id number] ? ?HPI ?Tick bite- pt has area on L lower leg w/ central punctate and surrounding redness.  There was dried blood at site but no tick at that time.  First noticed last night.  Not itchy or painful.  Hx of Lyme Disease. ? ? ?Review of Systems ?For ROS see HPI  ?   ?Objective:  ? Physical Exam ?Vitals reviewed.  ?Constitutional:   ?   General: [name]. ?   Appearance: Normal appearance. She is not ill-appearing.  ?HENT:  ?   Head: Normocephalic and atraumatic.  ?Skin: ?   General: Skin is warm and dry.  ?   Comments: Has bite site on L lower leg w/ 3cm ring of erythema surrounding it w/ faint central clearing.  No TTP, no swelling or induration  ?Neurological:  ?   General: No focal deficit present.  ?   Mental Status: She is alert and oriented to person, place, and time.  ?Psychiatric:     ?   Mood and Affect: Mood normal.     ?   Behavior: Behavior normal.     ?   Thought Content: Thought content normal.  ? ? ? ? ? ?   ?Assessment & Plan:  ? ?Tick bite- new.  Area in question is suspiciously target like.  Pt already has a hx of lyme.  Rather than take any chances, will start Doxycycline x10 days.  Pt expressed understanding and is in agreement w/ plan.  ?

## 2021-06-10 DIAGNOSIS — Z6822 Body mass index (BMI) 22.0-22.9, adult: Secondary | ICD-10-CM | POA: Diagnosis not present

## 2021-06-10 DIAGNOSIS — M65332 Trigger finger, left middle finger: Secondary | ICD-10-CM | POA: Diagnosis not present

## 2021-06-10 DIAGNOSIS — I10 Essential (primary) hypertension: Secondary | ICD-10-CM | POA: Diagnosis not present

## 2021-06-10 DIAGNOSIS — M65331 Trigger finger, right middle finger: Secondary | ICD-10-CM | POA: Diagnosis not present

## 2021-06-13 ENCOUNTER — Encounter: Payer: Self-pay | Admitting: Family Medicine

## 2021-06-13 ENCOUNTER — Ambulatory Visit: Payer: Medicare Other | Admitting: Family Medicine

## 2021-06-13 ENCOUNTER — Ambulatory Visit (INDEPENDENT_AMBULATORY_CARE_PROVIDER_SITE_OTHER): Payer: Medicare Other | Admitting: Family Medicine

## 2021-06-13 VITALS — BP 122/68 | HR 90 | Temp 98.7°F | Ht 64.0 in | Wt 140.0 lb

## 2021-06-13 DIAGNOSIS — W57XXXD Bitten or stung by nonvenomous insect and other nonvenomous arthropods, subsequent encounter: Secondary | ICD-10-CM

## 2021-06-13 DIAGNOSIS — S80862D Insect bite (nonvenomous), left lower leg, subsequent encounter: Secondary | ICD-10-CM

## 2021-06-13 NOTE — Progress Notes (Signed)
Subjective  CC:  Chief Complaint  Patient presents with   Tick Removal    Pt here to F/U with a tick bite. Pt has completed the doxycycline. Eye exam is coming up 7/23   Same day acute visit; PCP not available. New pt to me. Chart reviewed.   HPI: [name] [name] is a 76 y.o. female who presents to the office today to address the problems listed above in the chief complaint. Very pleasant 76 year old brittle type II diabetic presents for follow-up.  I reviewed her recent note with her primary care doctor.  She noted a red spot on her left lower leg with some dried blood.  She had a rash that looked like a possible target lesion.  She had no other symptoms at that time.  Doxycycline for 7 days was prescribed.  She has completed the treatment.  Overall she continues to feel fine.  She denies fevers, rash, arthralgias.  However the red spot in the lower leg persist.  The rash has improved but she still has a red area.  It is tender.  No drainage.  No warmth.  No systemic symptoms.  She does have a history of Lyme's disease 25 years ago.  Of note, she recently had a steroid injection for trigger finger.  Assessment  1. Tick bite of left lower leg, subsequent encounter      Plan  Possible tick bite versus other bite left lower extremity in a diabetic: Clinically she is well.  No symptoms of Lyme's disease.  Reassured.  She completed doxycycline.  Small bite wound still healing but does not look infected.  Unroofing the area did not reveal any foreign bodies.  Wound care discussed.  Monitor closely.  She should do well.  Follow up: As needed Visit date not found  No orders of the defined types were placed in this encounter.  No orders of the defined types were placed in this encounter.     I reviewed the patients updated PMH, FH, and SocHx.    Patient Active Problem List   Diagnosis Date Noted   Presence of insulin pump (external) (internal) 12/24/2020   Pain in finger of left hand  09/06/2020   Pain of left hand 09/02/2020   Chronic kidney disease 06/21/2020   Hereditary and idiopathic neuropathy, unspecified 06/21/2020   Hyperglycemia due to type 1 diabetes mellitus ([place]) 06/21/2020   Long term (current) use of insulin ([place]) 06/21/2020   Non-toxic goiter 06/21/2020   Pure hypercholesterolemia 06/21/2020   Aortic atherosclerosis ([place]) 06/21/2020   Ingrown toenail without infection 04/11/2015   Memory difficulties 06/05/2014   Abdominal pain, chronic, epigastric 01/29/2014   Dysphagia 01/29/2014   History of esophageal stricture 01/29/2014   Chronic conjunctivitis 02/07/2013   Cervical spondylosis without myelopathy 11/30/2012   LUQ abdominal pain 04/13/2012   Annual physical exam 03/18/2011   Headache 02/02/2011   NEURALGIA, TRIGEMINAL-- on Neurontin, Elavil, propranolol 03/13/2009   Hypothyroidism 11/07/2007   Type 2 diabetes mellitus with diabetic polyneuropathy ([place]) 02/14/2007   Anxiety-insomnia-UDS 02/14/2007   GERD 10/23/2006   Hx of melanoma of skin 05/10/2006   Hyperlipidemia associated with type 2 diabetes mellitus ([place]) 05/10/2006   Diverticulitis of colon 05/10/2006   Osteopenia 05/10/2006   ESOPHAGEAL STRICTURE 02/16/2005   Current Meds  Medication Sig   ALPRAZolam (XANAX) 0.5 MG tablet TAKE 1 TABLET(0.5 MG) BY MOUTH AT BEDTIME AS NEEDED FOR SLEEP   amitriptyline (ELAVIL) 25 MG tablet Take 1 tablet (25 mg total)  by mouth at bedtime.   aspirin 81 MG EC tablet Take 1 tablet (81 mg total) by mouth daily. Swallow whole.   baclofen (LIORESAL) 10 MG tablet Take 10 mg by mouth 2 (two) times daily. May take TID is needed   celecoxib (CELEBREX) 200 MG capsule Take 1 capsule twice daily with food as needed for gout pain.   Cholecalciferol (VITAMIN D3) 2000 UNITS TABS Take 1 tablet by mouth daily.   colchicine 0.6 MG tablet For a gout flare take 2 tablets at 1 time, then take 1/3 tablet 1 hour later.  Repeat every 3 days until gout flare subsides.    Continuous Blood Gluc Receiver (FREESTYLE LIBRE READER) DEVI by Does not apply route.   diclofenac Sodium (VOLTAREN) 1 % GEL Apply 2 g topically as needed.   ezetimibe (ZETIA) 10 MG tablet Take 1 tablet (10 mg total) by mouth daily.   famotidine (PEPCID) 20 MG tablet TAKE 1 TABLET(20 MG) BY MOUTH AT BEDTIME   gabapentin (NEURONTIN) 300 MG capsule TAKE TWO CAPSULES IN THE MORNING AND 2 CAPSULES AT BEDTIME   hyoscyamine (LEVSIN SL) 0.125 MG SL tablet Take 0.125 mg by mouth as needed.   Insulin Disposable Pump (OMNIPOD DASH INTRO, GEN 4,) KIT by Does not apply route. Change q 3 days   levothyroxine (SYNTHROID, LEVOTHROID) 50 MCG tablet Take 50 mcg by mouth daily before breakfast. Take 40mg M-F and 1039m on Sat and Sun   Lifitegrast 5 % SOLN Apply 1 each to eye as needed.   Melatonin 5 MG TABS Take 1 tablet by mouth at bedtime.   miconazole (MONISTAT 7) 2 % vaginal cream Apply cream externally to vagina where irritated daily   Multiple Vitamin (MULTIVITAMIN) tablet Take 1 tablet by mouth daily.   pantoprazole (PROTONIX) 40 MG tablet TAKE 1 TABLET(40 MG) BY MOUTH DAILY   Peppermint Oil (IBGARD) 90 MG CPCR Take as directed as needed   polyethylene glycol (MIRALAX / GLYCOLAX) 17 g packet Take 34 g by mouth daily.   Probiotic Product (PROBIOTIC PO) Take 1 tablet by mouth daily.   rosuvastatin (CRESTOR) 20 MG tablet Take 1 tablet (20 mg total) by mouth daily.    Allergies: Patient is allergic to rocephin [ceftriaxone], prednisone, ibandronic acid, other, tape, tramadol hcl, benadryl [diphenhydramine hcl], repaglinide, and sitagliptin. Family History: Patient family history includes Endometrial cancer in her sister; Heart attack in her paternal grandfather; Melanoma in her maternal uncle; Prostate cancer in her father; Stroke in her sister. Social History:  Patient  reports that she quit smoking about 42 years ago. Her smoking use included cigarettes. She started smoking about 45 years ago. She has  never used smokeless tobacco. She reports that she does not currently use alcohol. She reports that she does not use drugs.  Review of Systems: Constitutional: Negative for fever malaise or anorexia Cardiovascular: negative for chest pain Respiratory: negative for SOB or persistent cough Gastrointestinal: negative for abdominal pain  Objective  Vitals: BP 122/68   Pulse 90   Temp 98.7 F (37.1 C)   Ht _0  (1.626 m)   Wt 140 lb (63.5 kg)   SpO2 97%   BMI 24.03 kg/m  General: no acute distress , A&Ox3 Left anterior lower shin with less than 1 cm annular red area with central tiny vesicle.  No surrounding erythema or rash.  Mildly tender.  No purulence.  The area was cleansed with alcohol and a 22-gauge needle was used to unroofed this tiny vesicle.  No foreign body identified.  Patient tolerated procedure well.    Commons side effects, risks, benefits, and alternatives for medications and treatment plan prescribed today were discussed, and the patient expressed understanding of the given instructions. Patient is instructed to call or message via MyChart if he/she has any questions or concerns regarding our treatment plan. No barriers to understanding were identified. We discussed Red Flag symptoms and signs in detail. Patient expressed understanding regarding what to do in case of urgent or emergency type symptoms.  Medication list was reconciled, printed and provided to the patient in AVS. Patient instructions and summary information was reviewed with the patient as documented in the AVS. This note was prepared with assistance of Dragon voice recognition software. Occasional wrong-word or sound-a-like substitutions may have occurred due to the inherent limitations of voice recognition software  This visit occurred during the SARS-CoV-2 public health emergency.  Safety protocols were in place, including screening questions prior to the visit, additional usage of staff PPE, and extensive  cleaning of exam room while observing appropriate contact time as indicated for disinfecting solutions.

## 2021-06-23 ENCOUNTER — Ambulatory Visit: Payer: Medicare Other | Admitting: Family Medicine

## 2021-06-24 ENCOUNTER — Ambulatory Visit: Payer: Medicare Other | Admitting: Family Medicine

## 2021-06-27 ENCOUNTER — Telehealth: Payer: Self-pay | Admitting: Family Medicine

## 2021-06-27 DIAGNOSIS — F411 Generalized anxiety disorder: Secondary | ICD-10-CM

## 2021-06-27 MED ORDER — ALPRAZOLAM 0.5 MG PO TABS: ORAL_TABLET | ORAL | 3 refills | Status: DC

## 2021-06-27 NOTE — Telephone Encounter (Signed)
Patient walked in to office - stated she has no more xanax 0.5- is asking for a refill- Next fu 07/01/21 with Dr Birdie Riddle - Please send to [facility] on file per patient.

## 2021-06-27 NOTE — Telephone Encounter (Signed)
Refill sent to pharmacy as requested

## 2021-07-01 ENCOUNTER — Encounter: Payer: Self-pay | Admitting: Family Medicine

## 2021-07-01 ENCOUNTER — Ambulatory Visit (INDEPENDENT_AMBULATORY_CARE_PROVIDER_SITE_OTHER): Payer: Medicare Other | Admitting: Family Medicine

## 2021-07-01 VITALS — BP 118/62 | HR 89 | Temp 98.1°F | Resp 16 | Ht 64.0 in | Wt 137.0 lb

## 2021-07-01 DIAGNOSIS — E1169 Type 2 diabetes mellitus with other specified complication: Secondary | ICD-10-CM | POA: Diagnosis not present

## 2021-07-01 DIAGNOSIS — I700 Atherosclerosis of aorta: Secondary | ICD-10-CM | POA: Diagnosis not present

## 2021-07-01 DIAGNOSIS — I2510 Atherosclerotic heart disease of native coronary artery without angina pectoris: Secondary | ICD-10-CM

## 2021-07-01 DIAGNOSIS — E785 Hyperlipidemia, unspecified: Secondary | ICD-10-CM

## 2021-07-01 DIAGNOSIS — E1142 Type 2 diabetes mellitus with diabetic polyneuropathy: Secondary | ICD-10-CM | POA: Diagnosis not present

## 2021-07-01 LAB — CBC WITH DIFFERENTIAL/PLATELET
Basophils Absolute: 0 10*3/uL (ref 0.0–0.1)
Basophils Relative: 0.6 % (ref 0.0–3.0)
Eosinophils Absolute: 0.1 10*3/uL (ref 0.0–0.7)
Eosinophils Relative: 1.8 % (ref 0.0–5.0)
HCT: 40.7 % (ref 36.0–46.0)
Hemoglobin: 13.5 g/dL (ref 12.0–15.0)
Lymphocytes Relative: 20.3 % (ref 12.0–46.0)
Lymphs Abs: 1.3 10*3/uL (ref 0.7–4.0)
MCHC: 33.3 g/dL (ref 30.0–36.0)
MCV: 91.7 fl (ref 78.0–100.0)
Monocytes Absolute: 0.6 10*3/uL (ref 0.1–1.0)
Monocytes Relative: 9.9 % (ref 3.0–12.0)
Neutro Abs: 4.4 10*3/uL (ref 1.4–7.7)
Neutrophils Relative %: 67.4 % (ref 43.0–77.0)
Platelets: 235 10*3/uL (ref 150.0–400.0)
RBC: 4.44 Mil/uL (ref 3.87–5.11)
RDW: 13 % (ref 11.5–15.5)
WBC: 6.6 10*3/uL (ref 4.0–10.5)

## 2021-07-01 LAB — LIPID PANEL
Cholesterol: 200 mg/dL (ref 0–200)
HDL: 85.9 mg/dL (ref >39.00)
LDL Cholesterol: 89 mg/dL (ref 0–99)
NonHDL: 114.11
Total CHOL/HDL Ratio: 2
Triglycerides: 126 mg/dL (ref 0.0–149.0)
VLDL: 25.2 mg/dL (ref 0.0–40.0)

## 2021-07-01 LAB — BASIC METABOLIC PANEL
BUN: 29 mg/dL — ABNORMAL HIGH (ref 6–23)
CO2: 33 mEq/L — ABNORMAL HIGH (ref 19–32)
Calcium: 10.1 mg/dL (ref 8.4–10.5)
Chloride: 100 mEq/L (ref 96–112)
Creatinine, Ser: 1.03 mg/dL (ref 0.40–1.20)
GFR: 53.21 mL/min — ABNORMAL LOW (ref >60.00)
Glucose, Bld: 179 mg/dL — ABNORMAL HIGH (ref 70–99)
Potassium: 4.7 mEq/L (ref 3.5–5.1)
Sodium: 139 mEq/L (ref 135–145)

## 2021-07-01 LAB — MICROALBUMIN / CREATININE URINE RATIO
Creatinine,U: 75 mg/dL
Microalb Creat Ratio: 1 mg/g (ref 0.0–30.0)
Microalb, Ur: 0.7 mg/dL (ref 0.0–1.9)

## 2021-07-01 LAB — HEPATIC FUNCTION PANEL
ALT: 17 U/L (ref 0–35)
AST: 20 U/L (ref 0–37)
Albumin: 4.3 g/dL (ref 3.5–5.2)
Alkaline Phosphatase: 65 U/L (ref 39–117)
Bilirubin, Direct: 0.1 mg/dL (ref 0.0–0.3)
Total Bilirubin: 0.4 mg/dL (ref 0.2–1.2)
Total Protein: 7 g/dL (ref 6.0–8.3)

## 2021-07-01 LAB — TSH: TSH: 3.49 u[IU]/mL (ref 0.35–5.50)

## 2021-07-01 NOTE — Patient Instructions (Addendum)
Follow up in 6 months to recheck cholesterol We'll notify you of your lab results and make any changes if needed Keep up the good work on healthy diet and regular exercise- you look great! Call with any questions or concerns Hang in there w/ your sister! Have a great summer!!

## 2021-07-01 NOTE — Progress Notes (Incomplete)
   Subjective:    Patient ID: [name] [name], female    DOB: [birth year], 76 y.o.   MRN: [id number]  HPI Hyperlipidemia- chronic problem, on Zetia '10mg'$  daily and Crestor '20mg'$  daily.  Pt reports feeling 'ok'.  No CP, SOB, abd pain, N/V.  Aortic Atherosclerosis- chronic problem, on ASA and statin.  Currently asymptomatic- no CP, claudication  DM- chronic problem, following w/ Dr Chalmers Cater.  Last A1C 6.7%.  UTD on eye exam.  Due for microalbumin and foot exam.  Now has Dexcom.  Denies CP, SOB, HAs, visual changes.   Review of Systems For ROS see HPI     Objective:   Physical Exam        Assessment & Plan:

## 2021-07-02 NOTE — Progress Notes (Signed)
Results seen by pt Via my chart

## 2021-07-13 NOTE — Assessment & Plan Note (Signed)
Chronic problem.  Currently on ASA and statin to reduce risk.  Asymptomatic at this time.

## 2021-07-25 DIAGNOSIS — Z6822 Body mass index (BMI) 22.0-22.9, adult: Secondary | ICD-10-CM | POA: Diagnosis not present

## 2021-07-25 DIAGNOSIS — I10 Essential (primary) hypertension: Secondary | ICD-10-CM | POA: Diagnosis not present

## 2021-07-25 DIAGNOSIS — M65341 Trigger finger, right ring finger: Secondary | ICD-10-CM | POA: Diagnosis not present

## 2021-08-11 DIAGNOSIS — H0288A Meibomian gland dysfunction right eye, upper and lower eyelids: Secondary | ICD-10-CM | POA: Diagnosis not present

## 2021-08-11 DIAGNOSIS — E119 Type 2 diabetes mellitus without complications: Secondary | ICD-10-CM | POA: Diagnosis not present

## 2021-08-11 DIAGNOSIS — H0102B Squamous blepharitis left eye, upper and lower eyelids: Secondary | ICD-10-CM | POA: Diagnosis not present

## 2021-08-11 DIAGNOSIS — H0102A Squamous blepharitis right eye, upper and lower eyelids: Secondary | ICD-10-CM | POA: Diagnosis not present

## 2021-08-11 DIAGNOSIS — H0288B Meibomian gland dysfunction left eye, upper and lower eyelids: Secondary | ICD-10-CM | POA: Diagnosis not present

## 2021-08-11 DIAGNOSIS — Z961 Presence of intraocular lens: Secondary | ICD-10-CM | POA: Diagnosis not present

## 2021-08-12 ENCOUNTER — Other Ambulatory Visit: Payer: Self-pay

## 2021-08-12 ENCOUNTER — Other Ambulatory Visit (HOSPITAL_COMMUNITY): Payer: Self-pay

## 2021-08-12 MED ORDER — FAMOTIDINE 20 MG PO TABS: ORAL_TABLET | ORAL | 0 refills | Status: DC

## 2021-08-20 DIAGNOSIS — M65341 Trigger finger, right ring finger: Secondary | ICD-10-CM | POA: Diagnosis not present

## 2021-08-20 DIAGNOSIS — Z6822 Body mass index (BMI) 22.0-22.9, adult: Secondary | ICD-10-CM | POA: Diagnosis not present

## 2021-08-20 DIAGNOSIS — M65331 Trigger finger, right middle finger: Secondary | ICD-10-CM | POA: Diagnosis not present

## 2021-08-20 DIAGNOSIS — M65332 Trigger finger, left middle finger: Secondary | ICD-10-CM | POA: Diagnosis not present

## 2021-09-02 ENCOUNTER — Ambulatory Visit (INDEPENDENT_AMBULATORY_CARE_PROVIDER_SITE_OTHER): Payer: Medicare Other | Admitting: Physician Assistant

## 2021-09-02 ENCOUNTER — Encounter: Payer: Self-pay | Admitting: Physician Assistant

## 2021-09-02 VITALS — BP 113/70 | HR 82 | Temp 98.1°F | Ht 64.0 in | Wt 138.2 lb

## 2021-09-02 DIAGNOSIS — H6983 Other specified disorders of Eustachian tube, bilateral: Secondary | ICD-10-CM

## 2021-09-02 NOTE — Patient Instructions (Signed)
Take Claritin 10 mg daily Nasal saline three times daily; Flonase once daily Sudafed x 3 days  Call if worse or no improvement of symptoms

## 2021-09-02 NOTE — Progress Notes (Signed)
Subjective:    Patient ID: [name] [name], female    DOB: 07-21-1945, 76 y.o.   MRN: [id number]  Chief Complaint  Patient presents with   Otalgia    Pt c/o ear pain; two friends she was around last week tested positive for Covid; pt woke up sore throat and ear pain Friday, has taken 3 Tests Friday, Sunday and Monday and all negative Covid tests) pt right ear is in pain and when eating says its worse.     Otalgia    Patient is in today for bilateral ear pain / popping x 5 days. Some scratchy throat as well. No other symptoms to report. Friends have tested positive for COVID, but pt states all her tests have been neg.   Past Medical History:  Diagnosis Date   Abdominal pain    EGD and CTs neg, rx Amitriptiline (non ulcer dyspepsia) EGD 12/11.. s/p ballon dilitation   Anxiety    Cataract    Cervical spondylosis without myelopathy 11/30/2012   Chronic conjunctivitis    Diabetes mellitus    no meds, diet controlled   Diverticulosis of colon    GERD with stricture    s/p dilatations   History of Lyme disease    HOH (hard of hearing)    bilateral hearing aids   Hypercholesterolemia    Hyperlipidemia    Hypothyroidism     Dx 10-9 TSH 5.2 , then TSH normalized, eventually went up again 2012   Melanoma [facility])    h/o   Memory difficulties 06/05/2014   Osteopenia    Osteoporosis    Solar lentigo    face; s/p excision 3/09. See Derm q 6 months   Trigeminal neuralgia    h/o     Past Surgical History:  Procedure Laterality Date   ABDOMINAL HYSTERECTOMY     ANKLE SURGERY  07/01/2015   R andkle tendon surgery   BREAST BIOPSY Left 1988   BREAST EXCISIONAL BIOPSY Left 1988   CATARACT EXTRACTION Bilateral    CERVICAL FUSION  06/2017   CHOLECYSTECTOMY     COLON RESECTION  1999   d/t diverticulitis   COLON SURGERY     COLONOSCOPY     ESOPHAGEAL MANOMETRY N/A 03/17/2019   Procedure: ESOPHAGEAL MANOMETRY (EM)  NO PH;  Surgeon: Lavena Bullion, DO;  Location: [facility];   Service: Gastroenterology;  Laterality: N/A;   LUMBAR DISC SURGERY  07/25/18, 09/20   melanoma resection Right     Right Arm Surgery, R facial surgery (05-2012)      PTX s/t chest tube     SHOULDER SURGERY     right   UPPER GASTROINTESTINAL ENDOSCOPY     WRIST SURGERY     removed melaoma, right    Family History  Problem Relation Age of Onset   Prostate cancer Father    Endometrial cancer Sister        clear cell carcinoma   Stroke Sister    Heart attack Paternal Grandfather        GF in his 12s   Melanoma Maternal Uncle    Colon cancer Neg Hx    Breast cancer Neg Hx    Diabetes Neg Hx    Stomach cancer Neg Hx    Rectal cancer Neg Hx    Esophageal cancer Neg Hx     Social History   Tobacco Use   Smoking status: Former    Types: Cigarettes    Start date: 01/20/1976  Quit date: 56    Years since quitting: 42.6   Smokeless tobacco: Never  Vaping Use   Vaping Use: Never used  Substance Use Topics   Alcohol use: Not Currently   Drug use: No     Allergies  Allergen Reactions   Rocephin [Ceftriaxone] Anaphylaxis   Prednisone Other (See Comments)    High blood sugars (pt reports)   Ibandronic Acid Hives   Other     Can't use adhesive tape, VERY DELICATE SKIN FROM MELANOMA    Tape    Tramadol Hcl Hives   Benadryl [Diphenhydramine Hcl] Anxiety    Hyperactivity   Repaglinide Rash   Sitagliptin Rash    Review of Systems  HENT:  Positive for ear pain.    NEGATIVE UNLESS OTHERWISE INDICATED IN HPI      Objective:     BP 113/70 (BP Location: Left Arm)   Pulse 82   Temp 98.1 F (36.7 C) (Temporal)   Ht '5\' 4"'$  (1.626 m)   Wt 138 lb 3.2 oz (62.7 kg)   SpO2 95%   BMI 23.72 kg/m   Wt Readings from Last 3 Encounters:  09/02/21 138 lb 3.2 oz (62.7 kg)  07/01/21 137 lb (62.1 kg)  06/13/21 140 lb (63.5 kg)    BP Readings from Last 3 Encounters:  09/02/21 113/70  07/01/21 118/62  06/13/21 122/68     Physical Exam Constitutional:      Appearance:  Normal appearance.  HENT:     Head: Normocephalic.     Right Ear: Ear canal and external ear normal. A middle ear effusion is present. Tympanic membrane is not erythematous, retracted or bulging.     Left Ear: Ear canal and external ear normal. A middle ear effusion is present. Tympanic membrane is not erythematous, retracted or bulging.     Nose: No congestion.     Mouth/Throat:     Mouth: Mucous membranes are moist.  Eyes:     Extraocular Movements: Extraocular movements intact.     Conjunctiva/sclera: Conjunctivae normal.     Pupils: Pupils are equal, round, and reactive to light.  Cardiovascular:     Rate and Rhythm: Normal rate and regular rhythm.     Pulses: Normal pulses.     Heart sounds: Normal heart sounds.  Pulmonary:     Effort: Pulmonary effort is normal.     Breath sounds: Normal breath sounds.  Neurological:     Mental Status: She is alert.  Psychiatric:        Mood and Affect: Mood normal.        Assessment & Plan:   Problem List Items Addressed This Visit   None Visit Diagnoses     Acute dysfunction of Eustachian tube, bilateral    -  Primary        Plan: Reassured no infection indicating needing for infection Take Claritin 10 mg daily Nasal saline three times daily; Flonase once daily Sudafed x 3 days  Call if worse or no improvement of symptoms    Rosiland Sen M Kerin Kren, PA-C

## 2021-09-17 DIAGNOSIS — M6281 Muscle weakness (generalized): Secondary | ICD-10-CM | POA: Diagnosis not present

## 2021-09-17 DIAGNOSIS — R937 Abnormal findings on diagnostic imaging of other parts of musculoskeletal system: Secondary | ICD-10-CM | POA: Diagnosis not present

## 2021-09-17 DIAGNOSIS — M65871 Other synovitis and tenosynovitis, right ankle and foot: Secondary | ICD-10-CM | POA: Diagnosis not present

## 2021-09-17 DIAGNOSIS — E139 Other specified diabetes mellitus without complications: Secondary | ICD-10-CM | POA: Diagnosis not present

## 2021-09-17 DIAGNOSIS — M869 Osteomyelitis, unspecified: Secondary | ICD-10-CM | POA: Diagnosis not present

## 2021-09-19 DIAGNOSIS — E1165 Type 2 diabetes mellitus with hyperglycemia: Secondary | ICD-10-CM | POA: Diagnosis not present

## 2021-09-29 DIAGNOSIS — Z961 Presence of intraocular lens: Secondary | ICD-10-CM | POA: Diagnosis not present

## 2021-09-29 DIAGNOSIS — H0288B Meibomian gland dysfunction left eye, upper and lower eyelids: Secondary | ICD-10-CM | POA: Diagnosis not present

## 2021-09-29 DIAGNOSIS — H0102A Squamous blepharitis right eye, upper and lower eyelids: Secondary | ICD-10-CM | POA: Diagnosis not present

## 2021-09-29 DIAGNOSIS — H0102B Squamous blepharitis left eye, upper and lower eyelids: Secondary | ICD-10-CM | POA: Diagnosis not present

## 2021-09-29 DIAGNOSIS — H0288A Meibomian gland dysfunction right eye, upper and lower eyelids: Secondary | ICD-10-CM | POA: Diagnosis not present

## 2021-09-29 LAB — HM DIABETES EYE EXAM

## 2021-10-02 DIAGNOSIS — M6281 Muscle weakness (generalized): Secondary | ICD-10-CM | POA: Diagnosis not present

## 2021-10-02 DIAGNOSIS — E139 Other specified diabetes mellitus without complications: Secondary | ICD-10-CM | POA: Diagnosis not present

## 2021-10-02 DIAGNOSIS — M869 Osteomyelitis, unspecified: Secondary | ICD-10-CM | POA: Diagnosis not present

## 2021-10-02 DIAGNOSIS — M65871 Other synovitis and tenosynovitis, right ankle and foot: Secondary | ICD-10-CM | POA: Diagnosis not present

## 2021-10-02 DIAGNOSIS — R937 Abnormal findings on diagnostic imaging of other parts of musculoskeletal system: Secondary | ICD-10-CM | POA: Diagnosis not present

## 2021-10-19 DIAGNOSIS — E1165 Type 2 diabetes mellitus with hyperglycemia: Secondary | ICD-10-CM | POA: Diagnosis not present

## 2021-10-21 ENCOUNTER — Other Ambulatory Visit: Payer: Self-pay

## 2021-10-21 MED ORDER — PANTOPRAZOLE SODIUM 40 MG PO TBEC: DELAYED_RELEASE_TABLET | ORAL | 1 refills | Status: DC

## 2021-10-22 ENCOUNTER — Other Ambulatory Visit: Payer: Self-pay | Admitting: Cardiology

## 2021-10-22 DIAGNOSIS — M869 Osteomyelitis, unspecified: Secondary | ICD-10-CM | POA: Diagnosis not present

## 2021-10-22 DIAGNOSIS — M65871 Other synovitis and tenosynovitis, right ankle and foot: Secondary | ICD-10-CM | POA: Diagnosis not present

## 2021-10-22 DIAGNOSIS — E139 Other specified diabetes mellitus without complications: Secondary | ICD-10-CM | POA: Diagnosis not present

## 2021-10-22 DIAGNOSIS — R937 Abnormal findings on diagnostic imaging of other parts of musculoskeletal system: Secondary | ICD-10-CM | POA: Diagnosis not present

## 2021-10-22 DIAGNOSIS — M6281 Muscle weakness (generalized): Secondary | ICD-10-CM | POA: Diagnosis not present

## 2021-10-27 ENCOUNTER — Encounter: Payer: Self-pay | Admitting: Family Medicine

## 2021-10-27 DIAGNOSIS — F411 Generalized anxiety disorder: Secondary | ICD-10-CM

## 2021-10-27 MED ORDER — ALPRAZOLAM 0.5 MG PO TABS: ORAL_TABLET | ORAL | 3 refills | Status: DC

## 2021-10-28 DIAGNOSIS — E049 Nontoxic goiter, unspecified: Secondary | ICD-10-CM | POA: Diagnosis not present

## 2021-10-28 DIAGNOSIS — Z794 Long term (current) use of insulin: Secondary | ICD-10-CM | POA: Diagnosis not present

## 2021-10-28 DIAGNOSIS — E7800 Pure hypercholesterolemia, unspecified: Secondary | ICD-10-CM | POA: Diagnosis not present

## 2021-10-28 DIAGNOSIS — E1065 Type 1 diabetes mellitus with hyperglycemia: Secondary | ICD-10-CM | POA: Diagnosis not present

## 2021-10-28 DIAGNOSIS — Z9641 Presence of insulin pump (external) (internal): Secondary | ICD-10-CM | POA: Diagnosis not present

## 2021-10-28 DIAGNOSIS — E039 Hypothyroidism, unspecified: Secondary | ICD-10-CM | POA: Diagnosis not present

## 2021-10-28 DIAGNOSIS — N189 Chronic kidney disease, unspecified: Secondary | ICD-10-CM | POA: Diagnosis not present

## 2021-10-28 DIAGNOSIS — Z23 Encounter for immunization: Secondary | ICD-10-CM | POA: Diagnosis not present

## 2021-10-28 DIAGNOSIS — G609 Hereditary and idiopathic neuropathy, unspecified: Secondary | ICD-10-CM | POA: Diagnosis not present

## 2021-11-03 DIAGNOSIS — M25571 Pain in right ankle and joints of right foot: Secondary | ICD-10-CM | POA: Diagnosis not present

## 2021-11-04 DIAGNOSIS — L812 Freckles: Secondary | ICD-10-CM | POA: Diagnosis not present

## 2021-11-04 DIAGNOSIS — I788 Other diseases of capillaries: Secondary | ICD-10-CM | POA: Diagnosis not present

## 2021-11-04 DIAGNOSIS — D1801 Hemangioma of skin and subcutaneous tissue: Secondary | ICD-10-CM | POA: Diagnosis not present

## 2021-11-04 DIAGNOSIS — L570 Actinic keratosis: Secondary | ICD-10-CM | POA: Diagnosis not present

## 2021-11-04 DIAGNOSIS — L718 Other rosacea: Secondary | ICD-10-CM | POA: Diagnosis not present

## 2021-11-04 DIAGNOSIS — D2271 Melanocytic nevi of right lower limb, including hip: Secondary | ICD-10-CM | POA: Diagnosis not present

## 2021-11-04 DIAGNOSIS — D2272 Melanocytic nevi of left lower limb, including hip: Secondary | ICD-10-CM | POA: Diagnosis not present

## 2021-11-04 DIAGNOSIS — L820 Inflamed seborrheic keratosis: Secondary | ICD-10-CM | POA: Diagnosis not present

## 2021-11-04 DIAGNOSIS — D225 Melanocytic nevi of trunk: Secondary | ICD-10-CM | POA: Diagnosis not present

## 2021-11-12 DIAGNOSIS — M25571 Pain in right ankle and joints of right foot: Secondary | ICD-10-CM | POA: Diagnosis not present

## 2021-11-14 DIAGNOSIS — M25571 Pain in right ankle and joints of right foot: Secondary | ICD-10-CM | POA: Diagnosis not present

## 2021-11-21 DIAGNOSIS — M25571 Pain in right ankle and joints of right foot: Secondary | ICD-10-CM | POA: Diagnosis not present

## 2021-11-24 DIAGNOSIS — M65871 Other synovitis and tenosynovitis, right ankle and foot: Secondary | ICD-10-CM | POA: Diagnosis not present

## 2021-11-24 DIAGNOSIS — R937 Abnormal findings on diagnostic imaging of other parts of musculoskeletal system: Secondary | ICD-10-CM | POA: Diagnosis not present

## 2021-11-24 DIAGNOSIS — M6281 Muscle weakness (generalized): Secondary | ICD-10-CM | POA: Diagnosis not present

## 2021-11-24 DIAGNOSIS — M869 Osteomyelitis, unspecified: Secondary | ICD-10-CM | POA: Diagnosis not present

## 2021-11-24 DIAGNOSIS — E139 Other specified diabetes mellitus without complications: Secondary | ICD-10-CM | POA: Diagnosis not present

## 2021-11-26 DIAGNOSIS — M25571 Pain in right ankle and joints of right foot: Secondary | ICD-10-CM | POA: Diagnosis not present

## 2021-11-28 DIAGNOSIS — M25571 Pain in right ankle and joints of right foot: Secondary | ICD-10-CM | POA: Diagnosis not present

## 2021-12-01 DIAGNOSIS — M25571 Pain in right ankle and joints of right foot: Secondary | ICD-10-CM | POA: Diagnosis not present

## 2021-12-03 DIAGNOSIS — M25571 Pain in right ankle and joints of right foot: Secondary | ICD-10-CM | POA: Diagnosis not present

## 2021-12-16 DIAGNOSIS — M4326 Fusion of spine, lumbar region: Secondary | ICD-10-CM | POA: Diagnosis not present

## 2021-12-16 DIAGNOSIS — M5106 Intervertebral disc disorders with myelopathy, lumbar region: Secondary | ICD-10-CM | POA: Diagnosis not present

## 2021-12-19 DIAGNOSIS — E1165 Type 2 diabetes mellitus with hyperglycemia: Secondary | ICD-10-CM | POA: Diagnosis not present

## 2021-12-30 ENCOUNTER — Telehealth: Payer: Self-pay | Admitting: Family Medicine

## 2021-12-30 ENCOUNTER — Ambulatory Visit: Payer: Medicare Other | Admitting: Family Medicine

## 2021-12-30 NOTE — Telephone Encounter (Signed)
Left message for patient to call back and schedule Medicare Annual Wellness Visit (AWV) in office.   If not able to come in office, please offer to do virtually or by telephone.  Left office number and my jabber [phone].  Last AWV:09/18/2019   Please schedule at anytime with Nurse Health Advisor.

## 2022-01-06 ENCOUNTER — Ambulatory Visit (INDEPENDENT_AMBULATORY_CARE_PROVIDER_SITE_OTHER): Payer: Medicare Other | Admitting: Family Medicine

## 2022-01-06 ENCOUNTER — Encounter: Payer: Self-pay | Admitting: Family Medicine

## 2022-01-06 VITALS — BP 122/60 | HR 77 | Temp 98.9°F | Resp 16 | Ht 64.0 in | Wt 140.2 lb

## 2022-01-06 DIAGNOSIS — I2510 Atherosclerotic heart disease of native coronary artery without angina pectoris: Secondary | ICD-10-CM | POA: Diagnosis not present

## 2022-01-06 DIAGNOSIS — H6123 Impacted cerumen, bilateral: Secondary | ICD-10-CM

## 2022-01-06 DIAGNOSIS — E785 Hyperlipidemia, unspecified: Secondary | ICD-10-CM

## 2022-01-06 DIAGNOSIS — E1169 Type 2 diabetes mellitus with other specified complication: Secondary | ICD-10-CM

## 2022-01-06 LAB — HEPATIC FUNCTION PANEL
ALT: 14 U/L (ref 0–35)
AST: 19 U/L (ref 0–37)
Albumin: 4.4 g/dL (ref 3.5–5.2)
Alkaline Phosphatase: 67 U/L (ref 39–117)
Bilirubin, Direct: 0.1 mg/dL (ref 0.0–0.3)
Total Bilirubin: 0.4 mg/dL (ref 0.2–1.2)
Total Protein: 7.1 g/dL (ref 6.0–8.3)

## 2022-01-06 LAB — BASIC METABOLIC PANEL
BUN: 27 mg/dL — ABNORMAL HIGH (ref 6–23)
CO2: 32 mEq/L (ref 19–32)
Calcium: 9.8 mg/dL (ref 8.4–10.5)
Chloride: 101 mEq/L (ref 96–112)
Creatinine, Ser: 0.89 mg/dL (ref 0.40–1.20)
GFR: 63.17 mL/min (ref >60.00)
Glucose, Bld: 114 mg/dL — ABNORMAL HIGH (ref 70–99)
Potassium: 4.4 mEq/L (ref 3.5–5.1)
Sodium: 138 mEq/L (ref 135–145)

## 2022-01-06 LAB — LIPID PANEL
Cholesterol: 173 mg/dL (ref 0–200)
HDL: 87.7 mg/dL (ref >39.00)
LDL Cholesterol: 73 mg/dL (ref 0–99)
NonHDL: 85.03
Total CHOL/HDL Ratio: 2
Triglycerides: 58 mg/dL (ref 0.0–149.0)
VLDL: 11.6 mg/dL (ref 0.0–40.0)

## 2022-01-06 NOTE — Progress Notes (Signed)
   Subjective:    Patient ID: [name] [name], female    DOB: 01/03/1946, 76 y.o.   MRN: [id number]  HPI Hyperlipidemia- chronic problem.  On Crestor '20mg'$  daily and Zetia '10mg'$  daily.  Denies CP, SOB, abd pain, N/V.  Pt is exercising regularly.  Decreased hearing R ear- has appt upcoming w/ audiology.  Wants to know if there is wax in her ear prior to appt.   Review of Systems For ROS see HPI     Objective:   Physical Exam Vitals reviewed.  Constitutional:      General: [name].    Appearance: Normal appearance. [name]. She is not ill-appearing.  HENT:     Head: Normocephalic and atraumatic.     Right Ear: Decreased hearing noted. There is impacted cerumen (pt consented for irrigation and tolerated w/o difficulty).     Left Ear: Decreased hearing noted. There is impacted cerumen (pt consented for irrigation and tolerated w/o difficulty).  Eyes:     Conjunctiva/sclera: Conjunctivae normal.     Pupils: Pupils are equal, round, and reactive to light.  Neck:     Thyroid: No thyromegaly.  Cardiovascular:     Rate and Rhythm: Normal rate and regular rhythm.     Heart sounds: Normal heart sounds. No murmur heard. Pulmonary:     Effort: Pulmonary effort is normal. No respiratory distress.     Breath sounds: Normal breath sounds.  Abdominal:     General: There is no distension.     Palpations: Abdomen is soft.     Tenderness: There is no abdominal tenderness.  Musculoskeletal:     Cervical back: Normal range of motion and neck supple.  Lymphadenopathy:     Cervical: No cervical adenopathy.  Skin:    General: Skin is warm and dry.  Neurological:     Mental Status: She is alert and oriented to person, place, and time.  Psychiatric:        Behavior: Behavior normal.           Assessment & Plan:

## 2022-01-06 NOTE — Patient Instructions (Addendum)
Follow up in 6 months to recheck cholesterol We'll notify you of your lab results and make any changes if needed Keep up the good work on healthy diet and regular exercise- you look great! Call with any questions or concerns Stay Safe!  Stay Healthy! Happy Holidays!

## 2022-01-07 ENCOUNTER — Encounter: Payer: Self-pay | Admitting: Family Medicine

## 2022-01-07 ENCOUNTER — Telehealth: Payer: Self-pay

## 2022-01-07 NOTE — Telephone Encounter (Signed)
Informed pt of lab results  

## 2022-01-07 NOTE — Telephone Encounter (Signed)
-----   Message from Midge Minium, MD sent at 01/06/2022  9:17 PM EST ----- Labs look great!  No changes at this time

## 2022-01-19 DIAGNOSIS — E1165 Type 2 diabetes mellitus with hyperglycemia: Secondary | ICD-10-CM | POA: Diagnosis not present

## 2022-01-20 DIAGNOSIS — H6123 Impacted cerumen, bilateral: Secondary | ICD-10-CM | POA: Insufficient documentation

## 2022-01-20 NOTE — Assessment & Plan Note (Signed)
Chronic problem.  Tolerating Crestor '20mg'$  daily and Zetia '10mg'$  daily w/o difficulty.  Check labs.  Adjust meds prn

## 2022-01-20 NOTE — Assessment & Plan Note (Signed)
New.  Pt has hx of similar.  Has upcoming appt w/ audiology and wanted to be sure ears were irrigated prior to that appt.  She tolerated irrigation w/o difficulty.  TM's WNL after cerumen removal.

## 2022-02-04 ENCOUNTER — Other Ambulatory Visit: Payer: Self-pay | Admitting: Family Medicine

## 2022-02-04 DIAGNOSIS — Z1231 Encounter for screening mammogram for malignant neoplasm of breast: Secondary | ICD-10-CM

## 2022-02-19 DIAGNOSIS — E1165 Type 2 diabetes mellitus with hyperglycemia: Secondary | ICD-10-CM | POA: Diagnosis not present

## 2022-02-23 ENCOUNTER — Other Ambulatory Visit: Payer: Self-pay | Admitting: Family Medicine

## 2022-02-23 DIAGNOSIS — F411 Generalized anxiety disorder: Secondary | ICD-10-CM

## 2022-02-23 NOTE — Telephone Encounter (Signed)
Xanax 0.5 mg LOV: 01/06/22 Last Refill:10/27/21 Upcoming appt: 07/14/22

## 2022-02-23 NOTE — Telephone Encounter (Signed)
Informed pt that Rx has been sent in  

## 2022-03-02 DIAGNOSIS — H0288B Meibomian gland dysfunction left eye, upper and lower eyelids: Secondary | ICD-10-CM | POA: Diagnosis not present

## 2022-03-02 DIAGNOSIS — Z961 Presence of intraocular lens: Secondary | ICD-10-CM | POA: Diagnosis not present

## 2022-03-02 DIAGNOSIS — H0102B Squamous blepharitis left eye, upper and lower eyelids: Secondary | ICD-10-CM | POA: Diagnosis not present

## 2022-03-02 DIAGNOSIS — H0288A Meibomian gland dysfunction right eye, upper and lower eyelids: Secondary | ICD-10-CM | POA: Diagnosis not present

## 2022-03-02 DIAGNOSIS — H0102A Squamous blepharitis right eye, upper and lower eyelids: Secondary | ICD-10-CM | POA: Diagnosis not present

## 2022-03-06 ENCOUNTER — Encounter: Payer: Self-pay | Admitting: Adult Health

## 2022-03-06 ENCOUNTER — Ambulatory Visit (INDEPENDENT_AMBULATORY_CARE_PROVIDER_SITE_OTHER): Payer: Medicare Other

## 2022-03-06 ENCOUNTER — Ambulatory Visit: Payer: Medicare Other | Attending: Adult Health | Admitting: Adult Health

## 2022-03-06 VITALS — BP 146/98 | HR 89 | Ht 63.5 in | Wt 143.8 lb

## 2022-03-06 DIAGNOSIS — E038 Other specified hypothyroidism: Secondary | ICD-10-CM | POA: Diagnosis not present

## 2022-03-06 DIAGNOSIS — I480 Paroxysmal atrial fibrillation: Secondary | ICD-10-CM | POA: Diagnosis not present

## 2022-03-06 DIAGNOSIS — E1142 Type 2 diabetes mellitus with diabetic polyneuropathy: Secondary | ICD-10-CM | POA: Diagnosis not present

## 2022-03-06 DIAGNOSIS — I10 Essential (primary) hypertension: Secondary | ICD-10-CM | POA: Insufficient documentation

## 2022-03-06 NOTE — Patient Instructions (Addendum)
Medication Instructions:  Your physician recommends that you continue on your current medications as directed. Please refer to the Current Medication list given to you today.  *If you need a refill on your cardiac medications before your next appointment, please call your pharmacy*  Lab Work: NONE ordered at this time of appointment   If you have labs (blood work) drawn today and your tests are completely normal, you will receive your results only by: [place] (if you have MyChart) OR A paper copy in the mail If you have any lab test that is abnormal or we need to change your treatment, we will call you to review the results.  Testing/Procedures: [place]- Long Term Monitor Instructions  Your physician has requested you wear a ZIO patch monitor for 14 days.  This is a single patch monitor. [employer] supplies one patch monitor per enrollment. Additional stickers are not available. Please do not apply patch if you will be having a Nuclear Stress Test,  Echocardiogram, Cardiac CT, MRI, or Chest Xray during the period you would be wearing the  monitor. The patch cannot be worn during these tests. You cannot remove and re-apply the  ZIO XT patch monitor.  Your ZIO patch monitor will be mailed 3 day USPS to your address on file. It may take 3-5 days  to receive your monitor after you have been enrolled.  Once you have received your monitor, please review the enclosed instructions. Your monitor  has already been registered assigning a specific monitor serial # to you.  Billing and Patient Assistance Program Information  We have supplied [employer] with any of your insurance information on file for billing purposes. [employer] offers a sliding scale Patient Assistance Program for patients that do not have  insurance, or whose insurance does not completely cover the cost of the ZIO monitor.  You must apply for the Patient Assistance Program to qualify for this discounted rate.  To apply, please  call [employer] at [phone], select option 4, select option 2, ask to apply for  Patient Assistance Program. Theodore Demark will ask your household income, and how many people  are in your household. They will quote your out-of-pocket cost based on that information.  [employer] will also be able to set up a 36-month interest-free payment plan if needed.  Applying the monitor   Shave hair from upper left chest.  Hold abrader disc by orange tab. Rub abrader in 40 strokes over the upper left chest as  indicated in your monitor instructions.  Clean area with 4 enclosed alcohol pads. Let dry.  Apply patch as indicated in monitor instructions. Patch will be placed under collarbone on left  side of chest with arrow pointing upward.  Rub patch adhesive wings for 2 minutes. Remove white label marked "1". Remove the white  label marked "2". Rub patch adhesive wings for 2 additional minutes.  While looking in a mirror, press and release button in center of patch. A small green light will  flash 3-4 times. This will be your only indicator that the monitor has been turned on.  Do not shower for the first 24 hours. You may shower after the first 24 hours.  Press the button if you feel a symptom. You will hear a small click. Record Date, Time and  Symptom in the Patient Logbook.  When you are ready to remove the patch, follow instructions on the last 2 pages of Patient  Logbook. Stick patch monitor onto the last page of Patient  Logbook.  Place Patient Logbook in the blue and white box. Use locking tab on box and tape box closed  securely. The blue and white box has prepaid postage on it. Please place it in the mailbox as  soon as possible. Your physician should have your test results approximately 7 days after the  monitor has been mailed back to [facility]  Call [facility] at [phone] if you have questions regarding  your ZIO XT patch monitor. Call them immediately if you see an  orange light blinking on your  monitor.  If your monitor falls off in less than 4 days, contact our Monitor department at [phone].  If your monitor becomes loose or falls off after 4 days call [employer] at [phone] for  suggestions on securing your monitor   Follow-Up: At [facility], you and your health needs are our priority.  As part of our continuing mission to provide you with exceptional heart care, we have created designated Provider Care Teams.  These Care Teams include your primary Cardiologist (physician) and Advanced Practice Providers (APPs -  Physician Assistants and Nurse Practitioners) who all work together to provide you with the care you need, when you need it.  Your next appointment:   1 month(s)  Provider:   Donato Heinz, MD  or Jory Sims, DNP, ANP        Other Instructions

## 2022-03-06 NOTE — Progress Notes (Signed)
Cardiology Clinic Note   Patient Name: [name] [name] Date of Encounter: 03/06/2022  Primary Care Provider:  Midge Minium, MD Primary Cardiologist:    Patient Profile    77 year old female patient with history of hyperlipidemia, type 2 diabetes, hypothyroidism, with chronic chest discomfort.  No known history of heart disease in her family.  Was last seen by Dr. Nechama [name] on 02/28/2021 with complaints of dyspnea on exertion.  His note revealed a Lexiscan Myoview on 09/30/2020 was normal with an EF of 69%, echocardiogram showed normal biventricular function with grade 1 diastolic dysfunction and no significant valvular disease.  Coronary calcium score on 09/30/2020 was 213 (75 percentile).  She was to continue on rosuvastatin.  She is here for 1 year follow-up.  Past Medical History    Past Medical History:  Diagnosis Date   Abdominal pain    EGD and CTs neg, rx Amitriptiline (non ulcer dyspepsia) EGD 12/11.. s/p ballon dilitation   Anxiety    Cataract    Cervical spondylosis without myelopathy 11/30/2012   Chronic conjunctivitis    Diabetes mellitus    no meds, diet controlled   Diverticulosis of colon    GERD with stricture    s/p dilatations   History of Lyme disease    HOH (hard of hearing)    bilateral hearing aids   Hypercholesterolemia    Hyperlipidemia    Hypothyroidism     Dx 10-9 TSH 5.2 , then TSH normalized, eventually went up again 2012   Melanoma [name] [name])    h/o   Memory difficulties 06/05/2014   Osteopenia    Osteoporosis    Solar lentigo    face; s/p excision 3/09. See Derm q 6 months   Trigeminal neuralgia    h/o    Past Surgical History:  Procedure Laterality Date   ABDOMINAL HYSTERECTOMY     ANKLE SURGERY  07/01/2015   R andkle tendon surgery   BREAST BIOPSY Left 1988   BREAST EXCISIONAL BIOPSY Left 1988   CATARACT EXTRACTION Bilateral    CERVICAL FUSION  06/2017   CHOLECYSTECTOMY     COLON RESECTION  1999   d/t diverticulitis   COLON SURGERY      COLONOSCOPY     ESOPHAGEAL MANOMETRY N/A 03/17/2019   Procedure: ESOPHAGEAL MANOMETRY (EM)  NO PH;  Surgeon: [name] Bullion, DO;  Location: [facility];  Service: Gastroenterology;  Laterality: N/A;   LUMBAR DISC SURGERY  07/25/18, 09/20   melanoma resection Right     Right Arm Surgery, R facial surgery (05-2012)      PTX s/t chest tube     SHOULDER SURGERY     right   UPPER GASTROINTESTINAL ENDOSCOPY     WRIST SURGERY     removed melaoma, right    Allergies  Allergies  Allergen Reactions   Rocephin [Ceftriaxone] Anaphylaxis   Prednisone Other (See Comments)    High blood sugars (pt reports)   Ibandronic Acid Hives   Other     Can't use adhesive tape, VERY DELICATE SKIN FROM MELANOMA    Tape    Tramadol Hcl Hives   Benadryl [Diphenhydramine Hcl] Anxiety    Hyperactivity   Repaglinide Rash   Sitagliptin Rash    History of Present Illness    [name] [name] arrives today for ongoing assessment and management of hyperlipidemia, with complaints possible atrial fibrillation.  She explains that she is a brittle diabetic and has to keep up with her blood sugar and sometimes feels badly  after eating breakfast in the morning.  She states that she did feel something was wrong the other morning when she was taking her shower, and suddenly had a strong pain in between her shoulder blades which caused her to have some mild shortness of breath and get a little panicky.  She states that she got out of the shower dried off and put her fingers on her husband's K[employer]io[employer] which reported to her that she had atrial fibrillation.  She states she checked a little while later and she was having sinus tachycardia according to the monitor.  As a result of this and her uncertainty about whether or not she was actually in atrial fibrillation she decided to make an appointment.  She states that she has been having these feelings on and off over the last few months, but that did not think anything of it  until she saw her friends at her Bible study group when she mentioned this to them.  They encouraged her to be seen by cardiology.  Home Medications    Current Outpatient Medications  Medication Sig Dispense Refill   ALPRAZolam (XANAX) 0.5 MG tablet TAKE 1 TABLET(0.5 MG) BY MOUTH AT BEDTIME AS NEEDED FOR SLEEP 30 tablet 3   amitriptyline (ELAVIL) 25 MG tablet Take 1 tablet (25 mg total) by mouth at bedtime. 90 tablet 3   aspirin 81 MG EC tablet Take 1 tablet (81 mg total) by mouth daily. Swallow whole. 30 tablet 12   baclofen (LIORESAL) 10 MG tablet Take 10 mg by mouth 2 (two) times daily. May take TID is needed     Cholecalciferol (VITAMIN D3) 2000 UNITS TABS Take 1 tablet by mouth daily.     Continuous Blood Gluc Receiver ([employer]) DEVI by Does not apply route.     diclofenac Sodium (VOLTAREN) 1 % GEL Apply 2 g topically as needed.     gabapentin (NEURONTIN) 300 MG capsule TAKE TWO CAPSULES IN THE MORNING AND 2 CAPSULES AT BEDTIME 360 capsule 3   hyoscyamine (LEVSIN SL) 0.125 MG SL tablet Take 0.125 mg by mouth as needed. 368 tablet 1   Insulin Disposable Pump (OMNIPOD DASH INTRO, GEN 4,) KIT by Does not apply route. Change q 3 days     levothyroxine (SYNTHROID, LEVOTHROID) 50 MCG tablet Take 50 mcg by mouth daily before breakfast. Take 4mg M-F and 1062m on Sat and Sun  12   Lifitegrast 5 % SOLN Apply 1 each to eye as needed.     Melatonin 5 MG TABS Take 1 tablet by mouth at bedtime.     Multiple Vitamin (MULTIVITAMIN) tablet Take 1 tablet by mouth daily.     pantoprazole (PROTONIX) 40 MG tablet TAKE 1 TABLET(40 MG) BY MOUTH DAILY 90 tablet 1   Peppermint Oil (IBGARD) 90 MG CPCR Take as directed as needed     polyethylene glycol (MIRALAX / GLYCOLAX) 17 g packet Take 34 g by mouth daily.     rosuvastatin (CRESTOR) 20 MG tablet TAKE 1 TABLET(20 MG) BY MOUTH DAILY 90 tablet 3   No current facility-administered medications for this visit.     Family History    Family History   Problem Relation Age of Onset   Prostate cancer Father    Endometrial cancer Sister        clear cell carcinoma   Stroke Sister    Heart attack Paternal Grandfather        GF in his 8068s Melanoma Maternal Uncle  Colon cancer Neg Hx    Breast cancer Neg Hx    Diabetes Neg Hx    Stomach cancer Neg Hx    Rectal cancer Neg Hx    Esophageal cancer Neg Hx    She indicated that her mother is deceased. She indicated that her father is deceased. She indicated that both of her sisters are alive. She indicated that her brother is alive. She indicated that the status of her paternal grandfather is unknown. She indicated that the status of her maternal uncle is unknown. She indicated that the status of her neg hx is unknown.  Social History    Social History   Socioeconomic History   Marital status: Married    Spouse name: Percell Miller   Number of children: 2   Years of education: college   Highest education level: Not on file  Occupational History   Occupation: retired    Fish farm manager: RETIRED  Tobacco Use   Smoking status: Former    Types: Cigarettes    Start date: 01/20/1976    Quit date: 1981    Years since quitting: 43.1   Smokeless tobacco: Never  Vaping Use   Vaping Use: Never used  Substance and Sexual Activity   Alcohol use: Not Currently   Drug use: No   Sexual activity: Not Currently  Other Topics Concern   Not on file  Social History Narrative   Patient is married with 2 children.   Patient is right handed.   Patient has college education.   Patient does not drink caffeine.          Social Determinants of Health   Financial Resource Strain: Low Risk  (09/18/2019)   Overall Financial Resource Strain (CARDIA)    Difficulty of Paying Living Expenses: Not hard at all  Food Insecurity: No Food Insecurity (09/18/2019)   Hunger Vital Sign    Worried About Running Out of Food in the Last Year: Never true    [place] in the Last Year: Never true  Transportation  Needs: No Transportation Needs (09/18/2019)   PRAPARE - Hydrologist (Medical): No    Lack of Transportation (Non-Medical): No  Physical Activity: Sufficiently Active (09/18/2019)   Exercise Vital Sign    Days of Exercise per Week: 3 days    Minutes of Exercise per Session: 50 min  Stress: No Stress Concern Present (09/18/2019)   [place]    Feeling of Stress : Only a little  Social Connections: Socially Integrated (09/18/2019)   Social Connection and Isolation Panel [NHANES]    Frequency of Communication with Friends and Family: More than three times a week    Frequency of Social Gatherings with Friends and Family: More than three times a week    Attends Religious Services: More than 4 times per year    Active Member of [employer] or Organizations: Yes    Attends Music therapist: More than 4 times per year    Marital Status: Married  Human resources officer Violence: Not At Risk (09/18/2019)   Humiliation, Afraid, Rape, and Kick questionnaire    Fear of Current or Ex-Partner: No    Emotionally Abused: No    Physically Abused: No    Sexually Abused: No     Review of Systems    General:  No chills, fever, night sweats or weight changes.  Cardiovascular:  No chest pain, dyspnea on exertion, edema, orthopnea, palpitations,  paroxysmal nocturnal dyspnea.  Positive for pain between her shoulder blades. Dermatological: No rash, lesions/masses Respiratory: No cough, dyspnea Urologic: No hematuria, dysuria Abdominal:   No nausea, vomiting, diarrhea, bright red blood per rectum, melena, or hematemesis Neurologic:  No visual changes, wkns, changes in mental status. All other systems reviewed and are otherwise negative except as noted above.     Physical Exam    VS:  BP (!) 146/98   Pulse 89   Ht 5' 3.5" (1.613 m)   Wt 143 lb 12.8 oz (65.2 kg)   SpO2 98%   BMI 25.07 kg/m  , BMI Body mass  index is 25.07 kg/m.     GEN: Well nourished, well developed, in no acute distress. HEENT: normal. Neck: Supple, no JVD, carotid bruits, or masses. Cardiac: RRR, no murmurs, rubs, or gallops. No clubbing, cyanosis, edema.  Radials/DP/PT 2+ and equal bilaterally.  Respiratory:  Respirations regular and unlabored, clear to auscultation bilaterally. GI: Soft, nontender, nondistended, BS + x 4. MS: no deformity or atrophy. Skin: warm and dry, no rash. Neuro:  Strength and sensation are intact. Psych: Normal affect.  Accessory Clinical Findings    ECG personally reviewed by me today-normal sinus rhythm, heart rate of 89 bpm, biatrial enlargement. - No acute changes unchanged from EKG on 02/28/2021.  Lab Results  Component Value Date   WBC 6.6 07/01/2021   HGB 13.5 07/01/2021   HCT 40.7 07/01/2021   MCV 91.7 07/01/2021   PLT 235.0 07/01/2021   Lab Results  Component Value Date   CREATININE 0.89 01/06/2022   BUN 27 (H) 01/06/2022   NA 138 01/06/2022   K 4.4 01/06/2022   CL 101 01/06/2022   CO2 32 01/06/2022   Lab Results  Component Value Date   ALT 14 01/06/2022   AST 19 01/06/2022   ALKPHOS 67 01/06/2022   BILITOT 0.4 01/06/2022   Lab Results  Component Value Date   CHOL 173 01/06/2022   HDL 87.70 01/06/2022   LDLCALC 73 01/06/2022   LDLDIRECT 148.1 06/11/2006   TRIG 58.0 01/06/2022   CHOLHDL 2 01/06/2022    Lab Results  Component Value Date   HGBA1C 7.1 04/22/2020    Review of Prior Studies: NM Stress Test 09/30/2020   The study is normal. The study is low risk.   No ST deviation was noted.   Left ventricular function is normal. Nuclear stress EF: 69 %. The left ventricular ejection fraction is hyperdynamic (>65%).   Prior study not available for comparison.   Normal stress nuclear study with no ischemia or infarction.  Gated ejection fraction 69% with normal wall motion.    Echocardiogram 09/30/2020  1. Left ventricular ejection fraction, by estimation, is  65 to 70%. The  left ventricle has hyperdynamic function. The left ventricle has no  regional wall motion abnormalities. Left ventricular diastolic parameters  are consistent with Grade I diastolic  dysfunction (impaired relaxation).   2. Right ventricular systolic function is normal. The right ventricular  size is normal. There is normal pulmonary artery systolic pressure. The  estimated right ventricular systolic pressure is 0000000 mmHg.   3. The mitral valve is normal in structure. Mild mitral valve  regurgitation. No evidence of mitral stenosis.   4. The aortic valve is tricuspid. Aortic valve regurgitation is not  visualized. Mild aortic valve sclerosis is present, with no evidence of  aortic valve stenosis.   5. The inferior vena cava is normal in size with greater than 50%  respiratory variability, suggesting right atrial pressure of 3 mmHg.   Assessment & Plan   1.  PAF: Will place ZIO monitor for 2 weeks to evaluate if this is indeed occurring causing her symptoms of pain between her shoulder blades, and mild shortness of breath.  She is currently on aspirin 81 mg daily will not make any changes to her regimen at this time.  Will see her again in 1 month unless she becomes more symptomatic or we received a report identifying her rhythm as atrial fibrillation.  2.  Hypertension: Blood pressure is elevated today.  I did recheck it and it was unchanged at 146/80.  She admits to being a little nervous about whether or not she is having arrhythmias.  Will not make any changes to her medication regimen unless she remains elevated on follow-up.  3.  Insulin-dependent diabetes: Managed with frequent blood glucose checks to her monitor.  She reports that she is a "brittle" diabetic with sudden surges in blood sugar in the morning.  4.  Hypothyroidism: Being followed by endocrinologist.  Labs in May 2024 are planned.  Current medicines are reviewed at length with the patient today.  I have  spent 25 min's  dedicated to the care of this patient on the date of this encounter to include pre-visit review of records, assessment, management and diagnostic testing,with shared decision making. Signed, Phill Myron. West Pugh, ANP, [place]   03/06/2022 3:56 PM      Office [phone] Fax [phone]  Notice: This dictation was prepared with Dragon dictation along with smaller phrase technology. Any transcriptional errors that result from this process are unintentional and may not be corrected upon review.

## 2022-03-06 NOTE — Progress Notes (Unsigned)
Enrolled patient for a 14 day Zio XT monitor to be mailed to patients home  Dr Gardiner Rhyme to read

## 2022-03-10 DIAGNOSIS — I480 Paroxysmal atrial fibrillation: Secondary | ICD-10-CM

## 2022-03-11 ENCOUNTER — Telehealth: Payer: Self-pay | Admitting: Cardiology

## 2022-03-11 NOTE — Telephone Encounter (Signed)
Called patient, she was curious on how the monitor reports and pushing the button during symptoms. I did discuss the reasoning for notifying if she was symptoms. She verbalized understanding, thankful for callback.

## 2022-03-11 NOTE — Telephone Encounter (Signed)
Patient states she applied her heart monitor and received instructions from the company, but they conflict with what she was told by Jory Sims, NP.

## 2022-03-12 NOTE — Progress Notes (Signed)
Chief Complaint  Patient presents with   Room 1    Pt is here Alone. Pt states that she as a heart monitor on. Pt states that she is having hard time getting out what she wants to say.     HISTORY OF PRESENT ILLNESS:  03/17/22 ALL: [name] [name] returns for follow up for TN and diabetic neuropathy. She continues amitriptyline 65m QHS and gabapentin 6029mBID. She reports TN is stable. No significant pain. Neuropathy pain is mild. She feels symptoms are very well managed.   She remains concerned about word finding difficulty. She feels that she has trouble processing thoughts at times. 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 She finds herself stopping during mid sentence think of what she is trying to say. She is able to get thoughts out, eventually. She went to audiologist for adjustment of hearing aids. She feels that she is hearing sounds and interpreting better. She is sleeping well. She does take alprazolam at bedtime for insomnia. 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   Last A1C 6.7. She does continue to have some lower readings. Reading was low in our waiting room today. Now 6855She does have a sharp stabbing pain on the top of her head when blood sugar readings are lower. She is followed by cardiology for tachycardia with questionable afib. Currently wearing a heart monitor. She continues asa and rosuvastatin.   03/13/2021 ALL: [name] [name] for follow up for headaches. She continues amitriptyline 2547mnd gabapentin 600m14mD. She reports TN pain has been well managed. She did have a littler more pain following cataract surgery but feels it is well managed. She is concerned about low blood sugar readings. She has insulin pump for DMT1. She has had generalized fatigue, weakness, mental fog and headaches with low readings. She usually feels poorly if CBG drops below 80. She has had a couple of readings in the 50s.63se reports readings have improved over  the past year. She is followed closely by endo. Diabetic neuropathy is stable. She does continue to note some short term memory loss. She will forget what she went into a room for. She usually remembers once leaving the room. She is able to drive. She is able to perform ADLs independently. She manages her home and finances. She does have hearing aids. No family history of dementia.   11/15/2019 ALL:  [name] HIRANIa 76 y38. female here today for follow up for headaches.  She continues amitriptyline 25mg63m gabapentin 300/300/600 for management of head and neck pain. Imaging shows cervical spondylosis. She reports that both headaches, neck and back pain, as well as neuralgia pain are all well managed on this combination. She has felt more anxiety recently due to worsening diabetes. She is now on basil and meal coverage insulin. She is working closely with PCP. She had lumbar surgery last year and has recovered well. She does feel that she has some word finding concerns from time to time. She feels overwhelmed with new insulin regimen. She is very active. Loves to read. She participates in church groups.   HISTORY (copied from Dr WilliJannifer [name] on 11/15/2018)  [name] [name] is a 72 ye15 old right-handed white female with a history of a headache syndrome that is associated with sharp jabbing pain coming up from the right side of the neck into the right frontotemporal area.  This may occur on average once or twice a week.  The patient is on amitriptyline and gabapentin, she was on propranolol but stopped this medication.  She has diabetes and is currently on insulin, she does have a peripheral neuropathy associated with this.  She recently fell in May 2020 and injured her low back which required 2 surgical procedures, one on 25 July 2018 and the second on 20 September 2018.  The patient had severe sciatica on involving the right leg with weakness of the right leg.  She is now getting over her second surgery, she  is in physical therapy.  She is wearing a low back brace.  She at some point wishes to come off of the amitriptyline but she is still having a lot of back pain and muscle spasm.  She remains on gabapentin, she tolerates this fairly well.   REVIEW OF SYSTEMS: Out of a complete 14 system review of symptoms, the patient complains only of the following symptoms,memory loss, headaches, neck pain, back pain, neuropathy, anxiety and all other reviewed systems are negative.   ALLERGIES: Allergies  Allergen Reactions   Rocephin [Ceftriaxone] Anaphylaxis   Prednisone Other (See Comments)    High blood sugars (pt reports)   Ibandronic Acid Hives   Other     Can't use adhesive tape, VERY DELICATE SKIN FROM MELANOMA    Tape    Tramadol Hcl Hives   Benadryl [Diphenhydramine Hcl] Anxiety    Hyperactivity   Repaglinide Rash   Sitagliptin Rash     HOME MEDICATIONS: Outpatient Medications Prior to Visit  Medication Sig Dispense Refill   ALPRAZolam (XANAX) 0.5 MG tablet TAKE 1 TABLET(0.5 MG) BY MOUTH AT BEDTIME AS NEEDED FOR SLEEP 30 tablet 3   aspirin 81 MG EC tablet Take 1 tablet (81 mg total) by mouth daily. Swallow whole. 30 tablet 12   baclofen (LIORESAL) 10 MG tablet Take 10 mg by mouth 2 (two) times daily. May take TID is needed     Cholecalciferol (VITAMIN D3) 2000 UNITS TABS Take 1 tablet by mouth daily.     Continuous Blood Gluc Receiver ([place]) DEVI by Does not apply route.     diclofenac Sodium (VOLTAREN) 1 % GEL Apply 2 g topically as needed.     hyoscyamine (LEVSIN SL) 0.125 MG SL tablet Take 0.125 mg by mouth as needed. 368 tablet 1   Insulin Disposable Pump (OMNIPOD DASH INTRO, GEN 4,) KIT by Does not apply route. Change q 3 days     levothyroxine (SYNTHROID, LEVOTHROID) 50 MCG tablet Take 50 mcg by mouth daily before breakfast. Take 41mg M-F and 1068m on Sat and Sun  12   Lifitegrast 5 % SOLN Apply 1 each to eye as needed.     Melatonin 5 MG TABS Take 1 tablet by  mouth at bedtime.     Multiple Vitamin (MULTIVITAMIN) tablet Take 1 tablet by mouth daily.     pantoprazole (PROTONIX) 40 MG tablet TAKE 1 TABLET(40 MG) BY MOUTH DAILY 90 tablet 1   Peppermint Oil (IBGARD) 90 MG CPCR Take as directed as needed     polyethylene glycol (MIRALAX / GLYCOLAX) 17 g packet Take 34 g by mouth daily.     rosuvastatin (CRESTOR) 20 MG tablet TAKE 1 TABLET(20 MG) BY MOUTH DAILY 90 tablet 3   amitriptyline (ELAVIL) 25 MG tablet Take 1 tablet (25 mg total) by mouth at bedtime. 90 tablet 3   gabapentin (NEURONTIN) 300 MG capsule TAKE TWO CAPSULES IN THE MORNING AND 2 CAPSULES AT BEDTIME 360 capsule  3   No facility-administered medications prior to visit.     PAST MEDICAL HISTORY: Past Medical History:  Diagnosis Date   Abdominal pain    EGD and CTs neg, rx Amitriptiline (non ulcer dyspepsia) EGD 12/11.. s/p ballon dilitation   Anxiety    Cataract    Cervical spondylosis without myelopathy 11/30/2012   Chronic conjunctivitis    Diabetes mellitus    no meds, diet controlled   Diverticulosis of colon    GERD with stricture    s/p dilatations   History of Lyme disease    HOH (hard of hearing)    bilateral hearing aids   Hypercholesterolemia    Hyperlipidemia    Hypothyroidism     Dx 10-9 TSH 5.2 , then TSH normalized, eventually went up again 2012   Melanoma [facility])    h/o   Memory difficulties 06/05/2014   Osteopenia    Osteoporosis    Solar lentigo    face; s/p excision 3/09. See Derm q 6 months   Trigeminal neuralgia    h/o      PAST SURGICAL HISTORY: Past Surgical History:  Procedure Laterality Date   ABDOMINAL HYSTERECTOMY     ANKLE SURGERY  07/01/2015   R andkle tendon surgery   BREAST BIOPSY Left 1988   BREAST EXCISIONAL BIOPSY Left 1988   CATARACT EXTRACTION Bilateral    CERVICAL FUSION  06/2017   CHOLECYSTECTOMY     COLON RESECTION  1999   d/t diverticulitis   COLON SURGERY     COLONOSCOPY     ESOPHAGEAL MANOMETRY N/A 03/17/2019    Procedure: ESOPHAGEAL MANOMETRY (EM)  NO PH;  Surgeon: Lavena Bullion, DO;  Location: [facility];  Service: Gastroenterology;  Laterality: N/A;   LUMBAR DISC SURGERY  07/25/18, 09/20   melanoma resection Right     Right Arm Surgery, R facial surgery (05-2012)      PTX s/t chest tube     SHOULDER SURGERY     right   UPPER GASTROINTESTINAL ENDOSCOPY     WRIST SURGERY     removed melaoma, right     FAMILY HISTORY: Family History  Problem Relation Age of Onset   Prostate cancer Father    Endometrial cancer Sister        clear cell carcinoma   Stroke Sister    Heart attack Paternal Grandfather        GF in his 4s   Melanoma Maternal Uncle    Colon cancer Neg Hx    Breast cancer Neg Hx    Diabetes Neg Hx    Stomach cancer Neg Hx    Rectal cancer Neg Hx    Esophageal cancer Neg Hx      SOCIAL HISTORY: Social History   Socioeconomic History   Marital status: Married    Spouse name: Percell Miller   Number of children: 2   Years of education: college   Highest education level: Not on file  Occupational History   Occupation: retired    Fish farm manager: RETIRED  Tobacco Use   Smoking status: Former    Types: Cigarettes    Start date: 01/20/1976    Quit date: 1981    Years since quitting: 43.1   Smokeless tobacco: Never  Vaping Use   Vaping Use: Never used  Substance and Sexual Activity   Alcohol use: Not Currently   Drug use: No   Sexual activity: Not Currently  Other Topics Concern   Not on file  Social History Narrative  Patient is married with 2 children.   Patient is right handed.   Patient has college education.   Patient does not drink caffeine.          Social Determinants of Health   Financial Resource Strain: Low Risk  (09/18/2019)   Overall Financial Resource Strain (CARDIA)    Difficulty of Paying Living Expenses: Not hard at all  Food Insecurity: No Food Insecurity (09/18/2019)   Hunger Vital Sign    Worried About Running Out of Food in the Last Year:  Never true    [place] in the Last Year: Never true  Transportation Needs: No Transportation Needs (09/18/2019)   PRAPARE - Hydrologist (Medical): No    Lack of Transportation (Non-Medical): No  Physical Activity: Sufficiently Active (09/18/2019)   Exercise Vital Sign    Days of Exercise per Week: 3 days    Minutes of Exercise per Session: 50 min  Stress: No Stress Concern Present (09/18/2019)   [place]    Feeling of Stress : Only a little  Social Connections: Socially Integrated (09/18/2019)   Social Connection and Isolation Panel [NHANES]    Frequency of Communication with Friends and Family: More than three times a week    Frequency of Social Gatherings with Friends and Family: More than three times a week    Attends Religious Services: More than 4 times per year    Active Member of [employer] or Organizations: Yes    Attends Music therapist: More than 4 times per year    Marital Status: Married  Human resources officer Violence: Not At Risk (09/18/2019)   Humiliation, Afraid, Rape, and Kick questionnaire    Fear of Current or Ex-Partner: No    Emotionally Abused: No    Physically Abused: No    Sexually Abused: No      PHYSICAL EXAM  Vitals:   03/17/22 1031  BP: (!) 149/82  Pulse: 85  Weight: 144 lb 8 oz (65.5 kg)  Height: 5' 3"$  (1.6 m)     Body mass index is 25.6 kg/m.   Generalized: Well developed, in no acute distress   Neurological examination  Mentation: Alert oriented to time, place, history taking. Follows all commands speech and language fluent Cranial nerve II-XII: Pupils were equal round reactive to light. Extraocular movements were full, visual field were full on confrontational test. Facial sensation and strength were normal. Head turning and shoulder shrug  were normal and symmetric. Motor: The motor testing reveals 5 over 5 strength of all 4  extremities. Good symmetric motor tone is noted throughout.  Sensory: Sensory testing is intact to soft touch on all 4 extremities. No evidence of extinction is noted.  Coordination: Cerebellar testing reveals good finger-nose-finger and heel-to-shin bilaterally.  Gait and station: Gait is normal. Tandem gait is normal. Romberg is negative. No drift is seen.  Reflexes: Deep tendon reflexes are symmetric and normal bilaterally.    DIAGNOSTIC DATA (LABS, IMAGING, TESTING) - I reviewed patient records, labs, notes, testing and imaging myself where available.  Lab Results  Component Value Date   WBC 6.6 07/01/2021   HGB 13.5 07/01/2021   HCT 40.7 07/01/2021   MCV 91.7 07/01/2021   PLT 235.0 07/01/2021      Component Value Date/Time   NA 138 01/06/2022 1129   NA 143 03/12/2014 0000   NA 143 03/12/2014 0000   K 4.4 01/06/2022 1129  CL 101 01/06/2022 1129   CO2 32 01/06/2022 1129   GLUCOSE 114 (H) 01/06/2022 1129   GLUCOSE 126 (H) 11/11/2005 0831   BUN 27 (H) 01/06/2022 1129   BUN 27 (A) 03/12/2014 0000   CREATININE 0.89 01/06/2022 1129   CREATININE 0.97 (H) 06/21/2020 1508   CALCIUM 9.8 01/06/2022 1129   PROT 7.1 01/06/2022 1129   ALBUMIN 4.4 01/06/2022 1129   AST 19 01/06/2022 1129   ALT 14 01/06/2022 1129   ALKPHOS 67 01/06/2022 1129   BILITOT 0.4 01/06/2022 1129   GFRNONAA >60 06/03/2020 0851   GFRAA >60 02/10/2019 1817   Lab Results  Component Value Date   CHOL 173 01/06/2022   HDL 87.70 01/06/2022   LDLCALC 73 01/06/2022   LDLDIRECT 148.1 06/11/2006   TRIG 58.0 01/06/2022   CHOLHDL 2 01/06/2022   Lab Results  Component Value Date   HGBA1C 7.1 04/22/2020   Lab Results  Component Value Date   [id number] 06/05/2014   Lab Results  Component Value Date   TSH 3.49 07/01/2021       03/17/2022   11:05 AM  Montreal Cognitive Assessment   Visuospatial/ Executive (0/5) 4  Naming (0/3) 3  Attention: Read list of digits (0/2) 2  Attention: Read list of  letters (0/1) 1  Attention: Serial 7 subtraction starting at 100 (0/3) 3  Language: Repeat phrase (0/2) 1  Language : Fluency (0/1) 1  Abstraction (0/2) 2  Delayed Recall (0/5) 5  Orientation (0/6) 6  Total 28  Adjusted Score (based on education) 29       10/19/2017    3:23 PM  MMSE - Mini Mental State Exam  Orientation to time 5  Orientation to Place 5  Registration 3  Attention/ Calculation 5  Recall 3  Language- name 2 objects 2  Language- repeat 1  Language- follow 3 step command 3  Language- read & follow direction 1  Write a sentence 1  Copy design 1  Total score 30      ASSESSMENT AND PLAN  77 y.o. year old female  has a past medical history of Abdominal pain, Anxiety, Cataract, Cervical spondylosis without myelopathy (11/30/2012), Chronic conjunctivitis, Diabetes mellitus, Diverticulosis of colon, GERD with stricture, History of Lyme disease, HOH (hard of hearing), Hypercholesterolemia, Hyperlipidemia, Hypothyroidism, Melanoma ([place]), Memory difficulties (06/05/2014), Osteopenia, Osteoporosis, Solar lentigo, and Trigeminal neuralgia. here with   Memory loss - Plan: MR BRAIN W WO CONTRAST  trigeminal neuralgia  - Plan: gabapentin (NEURONTIN) 300 MG capsule, amitriptyline (ELAVIL) 25 MG tablet  Neuropathy - Plan: gabapentin (NEURONTIN) 300 MG capsule, amitriptyline (ELAVIL) 25 MG tablet  New onset of headaches after age 15 - Plan: Sheldon is doing well, today.  She feels that headaches, neck and back pain as well as neuropathy is well managed on amitriptyline 25 mg at bedtime and gabapentin 600 mg in the morning and 600 mg in the evening.  We will continue current treatment plan.  We have discussed concerns of word finding difficulty, delayed processing speeds and sharp pains in head. I will order an MRI to rule out intracranial process. May consider baseline formal neurocognitive testing if she wishes. MOCA 29/30. Memory compensation strategies  reviewed. She will continue to follow closely with primary care and endocrinology. Healthy lifestyle habits encouraged. She will follow up with me in 1 year, sooner if needed.     Debbora Presto, MSN, FNP-C 03/17/2022, 12:05 PM  [facility] [phone]  [address], [place], [place] [address] [phone]

## 2022-03-12 NOTE — Patient Instructions (Addendum)
Below is our plan:  We will continue amitriptyline '25mg'$  at bedtime and gabapentin '600mg'$  twice daily. We will check a brain MRI to make sure there are no changes to correlate with word finding trouble. Consider neurocognitive testing. I have provided information in your summary.   Consider having labs drawn to evaluate B12 levels and thyroid levels. Lower levels can correlate with memory loss. Keep a close eye on blood sugars.   Please make sure you are staying well hydrated. I recommend 50-60 ounces daily. Well balanced diet and regular exercise encouraged. Consistent sleep schedule with 6-8 hours recommended.   Please continue follow up with care team as directed.   Follow up with me in 1 year   You may receive a survey regarding today's visit. I encourage you to leave honest feed back as I do use this information to improve patient care. Thank you for seeing me today!   Management of Memory Problems   There are some general things you can do to help manage your memory problems.  Your memory may not in fact recover, but by using techniques and strategies you will be able to manage your memory difficulties better.   1)  Establish a routine. Try to establish and then stick to a regular routine.  By doing this, you will get used to what to expect and you will reduce the need to rely on your memory.  Also, try to do things at the same time of day, such as taking your medication or checking your calendar first thing in the morning. Think about think that you can do as a part of a regular routine and make a list.  Then enter them into a daily planner to remind you.  This will help you establish a routine.   2)  Organize your environment. Organize your environment so that it is uncluttered.  Decrease visual stimulation.  Place everyday items such as keys or cell phone in the same place every day (ie.  Basket next to front door) Use post it notes with a brief message to yourself (ie. Turn off light,  lock the door) Use labels to indicate where things go (ie. Which cupboards are for food, dishes, etc.) Keep a notepad and pen by the telephone to take messages   3)  Memory Aids A diary or journal/notebook/daily planner Making a list (shopping list, chore list, to do list that needs to be done) Using an alarm as a reminder (kitchen timer or cell phone alarm) Using cell phone to store information (Notes, Calendar, Reminders) Calendar/White board placed in a prominent position Post-it notes   In order for memory aids to be useful, you need to have good habits.  It's no good remembering to make a note in your journal if you don't remember to look in it.  Try setting aside a certain time of day to look in journal.   4)  Improving mood and managing fatigue. There may be other factors that contribute to memory difficulties.  Factors, such as anxiety, depression and tiredness can affect memory. Regular gentle exercise can help improve your mood and give you more energy. Exercise: there are short videos created by the [employer] on Health specially for older adults: https://bit.ly/2I30q97.  Mediterranean diet: which emphasizes fruits, vegetables, whole grains, legumes, fish, and other seafood; unsaturated fats such as olive oils; and low amounts of red meat, eggs, and sweets. A variation of this, called MIND [facility] Intervention for Neurodegenerative Delay) incorporates the DASH (Dietary  Approaches to Stop Hypertension) diet, which has been shown to lower high blood pressure, a risk factor for Alzheimer's disease. More information at: RepublicForum.gl.  Aerobic exercise that improve heart health is also good for the mind.  [employer] on Aging have short videos for exercises that you can do at home: GoldCloset.com.ee Simple relaxation techniques may help relieve symptoms of anxiety Try to get  back to completing activities or hobbies you enjoyed doing in the past. Learn to pace yourself through activities to decrease fatigue. Find out about some local support groups where you can share experiences with others. Try and achieve 7-8 hours of sleep at night.

## 2022-03-17 ENCOUNTER — Ambulatory Visit (INDEPENDENT_AMBULATORY_CARE_PROVIDER_SITE_OTHER): Payer: Medicare Other | Admitting: Family Medicine

## 2022-03-17 ENCOUNTER — Encounter: Payer: Self-pay | Admitting: Family Medicine

## 2022-03-17 VITALS — BP 149/82 | HR 85 | Ht 63.0 in | Wt 144.5 lb

## 2022-03-17 DIAGNOSIS — G500 Trigeminal neuralgia: Secondary | ICD-10-CM

## 2022-03-17 DIAGNOSIS — R413 Other amnesia: Secondary | ICD-10-CM

## 2022-03-17 DIAGNOSIS — R519 Headache, unspecified: Secondary | ICD-10-CM | POA: Diagnosis not present

## 2022-03-17 DIAGNOSIS — G629 Polyneuropathy, unspecified: Secondary | ICD-10-CM | POA: Diagnosis not present

## 2022-03-17 DIAGNOSIS — M47812 Spondylosis without myelopathy or radiculopathy, cervical region: Secondary | ICD-10-CM

## 2022-03-17 MED ORDER — GABAPENTIN 300 MG PO CAPS: ORAL_CAPSULE | ORAL | 3 refills | Status: DC

## 2022-03-17 MED ORDER — AMITRIPTYLINE HCL 25 MG PO TABS: 25.0000 mg | ORAL_TABLET | Freq: Every day | ORAL | 3 refills | Status: DC

## 2022-03-18 ENCOUNTER — Telehealth: Payer: Self-pay | Admitting: Family Medicine

## 2022-03-18 NOTE — Telephone Encounter (Signed)
medicare/BCBS NPR via [employer] sent to [facility] [phone]

## 2022-03-20 DIAGNOSIS — E1165 Type 2 diabetes mellitus with hyperglycemia: Secondary | ICD-10-CM | POA: Diagnosis not present

## 2022-03-31 ENCOUNTER — Ambulatory Visit
Admission: RE | Admit: 2022-03-31 | Discharge: 2022-03-31 | Disposition: A | Discharge status: Home / Self Care | Payer: Medicare Other | Source: Ambulatory Visit | Attending: Family Medicine | Admitting: Family Medicine

## 2022-03-31 DIAGNOSIS — Z1231 Encounter for screening mammogram for malignant neoplasm of breast: Secondary | ICD-10-CM | POA: Diagnosis not present

## 2022-04-01 NOTE — Progress Notes (Addendum)
Cardiology Clinic Note   Patient Name: [name] [name] Date of Encounter: 04/01/2022  Primary Care Provider:  Sheliah Hatch, MD Primary Cardiologist:  [name] Ishikawa, MD  Patient Profile    77 year old female patient with history of hyperlipidemia, type 2 diabetes, hypothyroidism, with chronic chest discomfort. No known history of heart disease in her family. Was last seen by [name] [name] on 02/28/2021 with complaints of dyspnea on exertion. His note revealed a Lexiscan Myoview on 09/30/2020 was normal with an EF of 69%, echocardiogram showed normal biventricular function with grade 1 diastolic dysfunction and no significant valvular disease.   Coronary calcium score on 09/30/2020 was 213 (75 percentile). She was to continue on rosuvastatin. She reported that she is a brittle diabetic and has to keep up with her blood sugar. On last office visit she was experiencing tachycardia, and was concerned about atrial fibrillation. A Zio monitor was placed.   Past Medical History    Past Medical History:  Diagnosis Date   Abdominal pain    EGD and CTs neg, rx Amitriptiline (non ulcer dyspepsia) EGD 12/11.. s/p ballon dilitation   Anxiety    Cataract    Cervical spondylosis without myelopathy 11/30/2012   Chronic conjunctivitis    Diabetes mellitus    no meds, diet controlled   Diverticulosis of colon    GERD with stricture    s/p dilatations   History of Lyme disease    HOH (hard of hearing)    bilateral hearing aids   Hypercholesterolemia    Hyperlipidemia    Hypothyroidism     Dx 10-9 TSH 5.2 , then TSH normalized, eventually went up again 2012   Melanoma [facility])    h/o   Memory difficulties 06/05/2014   Osteopenia    Osteoporosis    Solar lentigo    face; s/p excision 3/09. See Derm q 6 months   Trigeminal neuralgia    h/o    Past Surgical History:  Procedure Laterality Date   ABDOMINAL HYSTERECTOMY     ANKLE SURGERY  07/01/2015   R andkle tendon surgery   BREAST  BIOPSY Left 1988   BREAST EXCISIONAL BIOPSY Left 1988   CATARACT EXTRACTION Bilateral    CERVICAL FUSION  06/2017   CHOLECYSTECTOMY     COLON RESECTION  1999   d/t diverticulitis   COLON SURGERY     COLONOSCOPY     ESOPHAGEAL MANOMETRY N/A 03/17/2019   Procedure: ESOPHAGEAL MANOMETRY (EM)  NO PH;  Surgeon: Shellia Cleverly, DO;  Location: [facility];  Service: Gastroenterology;  Laterality: N/A;   LUMBAR DISC SURGERY  07/25/18, 09/20   melanoma resection Right     Right Arm Surgery, R facial surgery (05-2012)      PTX s/t chest tube     SHOULDER SURGERY     right   UPPER GASTROINTESTINAL ENDOSCOPY     WRIST SURGERY     removed melaoma, right    Allergies  Allergies  Allergen Reactions   Rocephin [Ceftriaxone] Anaphylaxis   Prednisone Other (See Comments)    High blood sugars (pt reports)   Ibandronic Acid Hives   Other     Can't use adhesive tape, VERY DELICATE SKIN FROM MELANOMA    Tape    Tramadol Hcl Hives   Benadryl [Diphenhydramine Hcl] Anxiety    Hyperactivity   Repaglinide Rash   Sitagliptin Rash    History of Present Illness    [name] returns today to discuss her cardiac monitor  and symptoms concerning dizziness, rapid heart rate, and fatigue.  She states she continues to have these episodes and is anxious to find some answers.  I have given her a copy of her ZIO cardiac monitor which did reveal 52 episodes of SVT, tachycardia, with average heart rate of 87 bpm, maximal heart rate of 190 bpm lowest heart rate 61 bpm.  She is symptomatic with some fatigue.  Home Medications    Current Outpatient Medications  Medication Sig Dispense Refill   ALPRAZolam (XANAX) 0.5 MG tablet TAKE 1 TABLET(0.5 MG) BY MOUTH AT BEDTIME AS NEEDED FOR SLEEP 30 tablet 3   amitriptyline (ELAVIL) 25 MG tablet Take 1 tablet (25 mg total) by mouth at bedtime. 90 tablet 3   aspirin 81 MG EC tablet Take 1 tablet (81 mg total) by mouth daily. Swallow whole. 30 tablet 12   baclofen  (LIORESAL) 10 MG tablet Take 10 mg by mouth 2 (two) times daily. May take TID is needed     Cholecalciferol (VITAMIN D3) 2000 UNITS TABS Take 1 tablet by mouth daily.     Continuous Blood Gluc Receiver (DEXCOM G7 RECEIVER) DEVI by Does not apply route.     diclofenac Sodium (VOLTAREN) 1 % GEL Apply 2 g topically as needed.     gabapentin (NEURONTIN) 300 MG capsule TAKE TWO CAPSULES IN THE MORNING AND 2 CAPSULES AT BEDTIME 360 capsule 3   hyoscyamine (LEVSIN SL) 0.125 MG SL tablet Take 0.125 mg by mouth as needed. 368 tablet 1   Insulin Disposable Pump (OMNIPOD DASH INTRO, GEN 4,) KIT by Does not apply route. Change q 3 days     levothyroxine (SYNTHROID, LEVOTHROID) 50 MCG tablet Take 50 mcg by mouth daily before breakfast. Take M-F and on Sat and Sun  12   Lifitegrast 5 % SOLN Apply 1 each to eye as needed.     Melatonin 5 MG TABS Take 1 tablet by mouth at bedtime.     Multiple Vitamin (MULTIVITAMIN) tablet Take 1 tablet by mouth daily.     pantoprazole (PROTONIX) 40 MG tablet TAKE 1 TABLET(40 MG) BY MOUTH DAILY 90 tablet 1   Peppermint Oil (IBGARD) 90 MG CPCR Take as directed as needed     polyethylene glycol (MIRALAX / GLYCOLAX) 17 g packet Take 34 g by mouth daily.     rosuvastatin (CRESTOR) 20 MG tablet TAKE 1 TABLET(20 MG) BY MOUTH DAILY 90 tablet 3   No current facility-administered medications for this visit.     Family History    Family History  Problem Relation Age of Onset   Prostate cancer Father    Endometrial cancer Sister        clear cell carcinoma   Stroke Sister    Heart attack Paternal Grandfather        GF in his 16s   Melanoma Maternal Uncle    Colon cancer Neg Hx    Breast cancer Neg Hx    Diabetes Neg Hx    Stomach cancer Neg Hx    Rectal cancer Neg Hx    Esophageal cancer Neg Hx    She indicated that her mother is deceased. She indicated that her father is deceased. She indicated that both of her sisters are alive. She indicated that her  brother is alive. She indicated that the status of her paternal grandfather is unknown. She indicated that the status of her maternal uncle is unknown. She indicated that the status of her neg hx  is unknown.  Social History    Social History   Socioeconomic History   Marital status: Married    Spouse name: Ramon Dredge   Number of children: 2   Years of education: college   Highest education level: Not on file  Occupational History   Occupation: retired    Associate Professor: RETIRED  Tobacco Use   Smoking status: Former    Types: Cigarettes    Start date: 01/20/1976    Quit date: 1981    Years since quitting: 43.2   Smokeless tobacco: Never  Vaping Use   Vaping Use: Never used  Substance and Sexual Activity   Alcohol use: Not Currently   Drug use: No   Sexual activity: Not Currently  Other Topics Concern   Not on file  Social History Narrative   Patient is married with 2 children.   Patient is right handed.   Patient has college education.   Patient does not drink caffeine.          Social Determinants of Health   Financial Resource Strain: Low Risk  (09/18/2019)   Overall Financial Resource Strain (CARDIA)    Difficulty of Paying Living Expenses: Not hard at all  Food Insecurity: No Food Insecurity (09/18/2019)   Hunger Vital Sign    Worried About Running Out of Food in the Last Year: Never true    Ran Out of Food in the Last Year: Never true  Transportation Needs: No Transportation Needs (09/18/2019)   PRAPARE - Administrator, Civil Service (Medical): No    Lack of Transportation (Non-Medical): No  Physical Activity: Sufficiently Active (09/18/2019)   Exercise Vital Sign    Days of Exercise per Week: 3 days    Minutes of Exercise per Session: 50 min  Stress: No Stress Concern Present (09/18/2019)   [employer] of Occupational Health - Occupational Stress Questionnaire    Feeling of Stress : Only a [name]  Social Connections: Socially Integrated (09/18/2019)    Social Connection and Isolation Panel [NHANES]    Frequency of Communication with Friends and Family: More than three times a week    Frequency of Social Gatherings with Friends and Family: More than three times a week    Attends Religious Services: More than 4 times per year    Active Member of [employer] or Organizations: Yes    Attends Engineer, structural: More than 4 times per year    Marital Status: Married  Catering manager Violence: Not At Risk (09/18/2019)   Humiliation, Afraid, Rape, and Kick questionnaire    Fear of Current or Ex-Partner: No    Emotionally Abused: No    Physically Abused: No    Sexually Abused: No     Review of Systems    General:  No chills, fever, night sweats or weight changes.  Cardiovascular:  No chest pain, dyspnea on exertion, edema, orthopnea, palpitations, paroxysmal nocturnal dyspnea. Dermatological: No rash, lesions/masses Respiratory: No cough, dyspnea Urologic: No hematuria, dysuria Abdominal:   No nausea, vomiting, diarrhea, bright red blood per rectum, melena, or hematemesis Neurologic:  No visual changes, wkns, changes in mental status. All other systems reviewed and are otherwise negative except as noted above.     Physical Exam    VS:  BP 132/72 HR 90 bpm. O2 Sat% 98%. Wt. 141  ,      GEN: Well nourished, well developed, in no acute distress. HEENT: normal. Neck: Supple, no JVD, carotid bruits, or masses. Cardiac: RRR,tachycardic, no  murmurs, rubs, or gallops. No clubbing, cyanosis, edema.  Radials/DP/PT 2+ and equal bilaterally.  Respiratory:  Respirations regular and unlabored, clear to auscultation bilaterally. GI: Soft, nontender, nondistended, BS + x 4. MS: no deformity or atrophy. Skin: warm and dry, no rash. Neuro:  Strength and sensation are intact. Psych: Normal affect.  Accessory Clinical Findings    ECG personally reviewed by me today-not completed this office visit  Lab Results  Component Value Date   WBC  6.6 07/01/2021   HGB 13.5 07/01/2021   HCT 40.7 07/01/2021   MCV 91.7 07/01/2021   PLT 235.0 07/01/2021   Lab Results  Component Value Date   CREATININE 0.89 01/06/2022   BUN 27 (H) 01/06/2022   NA 138 01/06/2022   K 4.4 01/06/2022   CL 101 01/06/2022   CO2 32 01/06/2022   Lab Results  Component Value Date   ALT 14 01/06/2022   AST 19 01/06/2022   ALKPHOS 67 01/06/2022   BILITOT 0.4 01/06/2022   Lab Results  Component Value Date   CHOL 173 01/06/2022   HDL 87.70 01/06/2022   LDLCALC 73 01/06/2022   LDLDIRECT 148.1 06/11/2006   TRIG 58.0 01/06/2022   CHOLHDL 2 01/06/2022    Lab Results  Component Value Date   HGBA1C 7.1 04/22/2020    Review of Prior Studies: Zio Monitor 03/06/2022 Patient had a min HR of 61 bpm, max HR of 190 bpm, and avg HR of 87 bpm. Predominant underlying rhythm was Sinus Rhythm. 52 Supraventricular Tachycardia runs occurred, the run with the fastest interval lasting 8.8 secs with a max rate of 190 bpm (avg 155  bpm); the run with the fastest interval was also the longest. Isolated SVEs were rare (<1.0%), SVE Couplets were rare (<1.0%), and SVE Triplets were rare (<1.0%). Isolated VEs were rare (<1.0%), VE Couplets were rare (<1.0%), and no VE Triplets were  present. Ventricular Trigeminy was present.   NM Stress Test 09/30/2020   The study is normal. The study is low risk.   No ST deviation was noted.   Left ventricular function is normal. Nuclear stress EF: 69 %. The left ventricular ejection fraction is hyperdynamic (>65%).   Prior study not available for comparison.   Normal stress nuclear study with no ischemia or infarction.  Gated ejection fraction 69% with normal wall motion.      Echocardiogram 09/30/2020  1. Left ventricular ejection fraction, by estimation, is 65 to 70%. The  left ventricle has hyperdynamic function. The left ventricle has no  regional wall motion abnormalities. Left ventricular diastolic parameters  are consistent  with Grade I diastolic  dysfunction (impaired relaxation).   2. Right ventricular systolic function is normal. The right ventricular  size is normal. There is normal pulmonary artery systolic pressure. The  estimated right ventricular systolic pressure is 23.4 mmHg.   3. The mitral valve is normal in structure. Mild mitral valve  regurgitation. No evidence of mitral stenosis.   4. The aortic valve is tricuspid. Aortic valve regurgitation is not  visualized. Mild aortic valve sclerosis is present, with no evidence of  aortic valve stenosis.   5. The inferior vena cava is normal in size with greater than 50%  respiratory variability, suggesting right atrial pressure of 3 mmHg.   Assessment & Plan   1.  Paroxysmal supraventricular tachycardia: She is symptomatic with elevated heart rates of 190 bpm.  I will begin her on metoprolol tartrate 25 mg twice daily.  I have advised her  that it may take a [name] time for her to settle out concerning her rapid heart rhythm.  She can return to swimming and may notice that she does have some breakthrough rapid heart rate with exercise which should subside sooner.  Will also talk to her about the side effect of fatigue on this beta-blocker.  She will follow-up with Dr. Darryl Nestle in a month to discuss her response to medication and for need for titration and/or referral to electrophysiology if she is not comfortable on the medication.  It is reassuring that she does not have atrial fibrillation.  2.  Coronary artery disease: Nonobstructive coronary calcium score 213.  Remains on statin therapy.  3.  Hyperlipidemia: Continue statin therapy with follow-up lipids and LFTs in 6 months if not completed by primary care.  Addendum completed to correct dictation errors.   Current medicines are reviewed at length with the patient today.  I have spent 25 min's  dedicated to the care of this patient on the date of this encounter to include pre-visit review of records,  assessment, management and diagnostic testing,with shared decision making. Signed, Bettey Mare. Liborio Nixon, ANP, AACC   04/01/2022 3:44 PM      Office [phone] Fax [phone]  Notice: This dictation was prepared with Dragon dictation along with smaller phrase technology. Any transcriptional errors that result from this process are unintentional and may not be corrected upon review.

## 2022-04-02 DIAGNOSIS — I480 Paroxysmal atrial fibrillation: Secondary | ICD-10-CM | POA: Diagnosis not present

## 2022-04-03 ENCOUNTER — Ambulatory Visit: Payer: Medicare Other | Attending: Adult Health | Admitting: Adult Health

## 2022-04-03 ENCOUNTER — Encounter: Payer: Self-pay | Admitting: Adult Health

## 2022-04-03 VITALS — BP 138/72 | HR 90 | Ht 63.5 in | Wt 141.0 lb

## 2022-04-03 DIAGNOSIS — I471 Supraventricular tachycardia, unspecified: Secondary | ICD-10-CM | POA: Insufficient documentation

## 2022-04-03 DIAGNOSIS — E7800 Pure hypercholesterolemia, unspecified: Secondary | ICD-10-CM | POA: Insufficient documentation

## 2022-04-03 DIAGNOSIS — I2510 Atherosclerotic heart disease of native coronary artery without angina pectoris: Secondary | ICD-10-CM | POA: Diagnosis not present

## 2022-04-03 MED ORDER — METOPROLOL TARTRATE 25 MG PO TABS: 25.0000 mg | ORAL_TABLET | Freq: Two times a day (BID) | ORAL | 3 refills | Status: DC

## 2022-04-03 NOTE — Patient Instructions (Signed)
Medication Instructions:  Start Metoprolol Tartrate 25 mg ( Take 1 Tablet Twice Daily). *If you need a refill on your cardiac medications before your next appointment, please call your pharmacy*   Lab Work: No labs If you have labs (blood work) drawn today and your tests are completely normal, you will receive your results only by: [place] (if you have MyChart) OR A paper copy in the mail If you have any lab test that is abnormal or we need to change your treatment, we will call you to review the results.   Testing/Procedures: No Testing    Follow-Up: At [facility], you and your health needs are our priority.  As part of our continuing mission to provide you with exceptional heart care, we have created designated Provider Care Teams.  These Care Teams include your primary Cardiologist (physician) and Advanced Practice Providers (APPs -  Physician Assistants and Nurse Practitioners) who all work together to provide you with the care you need, when you need it.  We recommend signing up for the patient portal called "MyChart".  Sign up information is provided on this After Visit Summary.  MyChart is used to connect with patients for Virtual Visits (Telemedicine).  Patients are able to view lab/test results, encounter notes, upcoming appointments, etc.  Non-urgent messages can be sent to your provider as well.   To learn more about what you can do with MyChart, go to [email or web address].    Your next appointment:   1 month(s)  Provider:   Donato Heinz, MD

## 2022-04-04 ENCOUNTER — Encounter: Payer: Self-pay | Admitting: Adult Health

## 2022-04-08 ENCOUNTER — Other Ambulatory Visit: Payer: Self-pay

## 2022-04-08 ENCOUNTER — Ambulatory Visit
Admission: RE | Admit: 2022-04-08 | Discharge: 2022-04-08 | Disposition: A | Discharge status: Home / Self Care | Payer: Medicare Other | Source: Ambulatory Visit | Attending: Family Medicine | Admitting: Family Medicine

## 2022-04-08 DIAGNOSIS — R413 Other amnesia: Secondary | ICD-10-CM | POA: Diagnosis not present

## 2022-04-08 DIAGNOSIS — R519 Headache, unspecified: Secondary | ICD-10-CM | POA: Diagnosis not present

## 2022-04-08 DIAGNOSIS — Z79899 Other long term (current) drug therapy: Secondary | ICD-10-CM

## 2022-04-08 MED ORDER — GADOPICLENOL 0.5 MMOL/ML IV SOLN
7.0000 mL | Freq: Once | INTRAVENOUS | Status: AC | PRN
  Administered 2022-04-08: 7 mL via INTRAVENOUS

## 2022-04-09 ENCOUNTER — Encounter: Payer: Self-pay | Admitting: Family Medicine

## 2022-04-09 ENCOUNTER — Other Ambulatory Visit: Payer: Self-pay | Admitting: Family Medicine

## 2022-04-09 DIAGNOSIS — R413 Other amnesia: Secondary | ICD-10-CM

## 2022-04-11 ENCOUNTER — Telehealth: Payer: Self-pay | Admitting: Adult Health

## 2022-04-11 MED ORDER — METOPROLOL SUCCINATE ER 25 MG PO TB24: 25.0000 mg | ORAL_TABLET | Freq: Every evening | ORAL | 4 refills | Status: DC

## 2022-04-11 NOTE — Telephone Encounter (Signed)
Received patient message via phone call concerning metoprolol tartrate 25 mg BID.[name] She was placed on this for PSVT occurring several times a day and documented by [facility] cardiac monitor.  She was unable to tolerate this dose due to profound fatigue.  I reduced the dose and changed it to long acting metoprolol succinate, 25 mg at HS.  This was sent to her pharmacy.  I contacted the patient to inform her of the change in medication and the long acting dose, which she is to take once every evening. She is to follow up and let us know how she is tolerating the medication change. She is to keep her appointment with her cardiologist as scheduled. She sent a message back acknowledging the new dosing and the change in dosing time.   Curt Bears

## 2022-04-20 DIAGNOSIS — E1165 Type 2 diabetes mellitus with hyperglycemia: Secondary | ICD-10-CM | POA: Diagnosis not present

## 2022-04-21 ENCOUNTER — Other Ambulatory Visit: Payer: Self-pay | Admitting: Family Medicine

## 2022-04-28 DIAGNOSIS — G609 Hereditary and idiopathic neuropathy, unspecified: Secondary | ICD-10-CM | POA: Diagnosis not present

## 2022-04-28 DIAGNOSIS — Z794 Long term (current) use of insulin: Secondary | ICD-10-CM | POA: Diagnosis not present

## 2022-04-28 DIAGNOSIS — E049 Nontoxic goiter, unspecified: Secondary | ICD-10-CM | POA: Diagnosis not present

## 2022-04-28 DIAGNOSIS — E7800 Pure hypercholesterolemia, unspecified: Secondary | ICD-10-CM | POA: Diagnosis not present

## 2022-04-28 DIAGNOSIS — E1065 Type 1 diabetes mellitus with hyperglycemia: Secondary | ICD-10-CM | POA: Diagnosis not present

## 2022-04-28 DIAGNOSIS — N189 Chronic kidney disease, unspecified: Secondary | ICD-10-CM | POA: Diagnosis not present

## 2022-04-28 DIAGNOSIS — Z9641 Presence of insulin pump (external) (internal): Secondary | ICD-10-CM | POA: Diagnosis not present

## 2022-04-28 DIAGNOSIS — E039 Hypothyroidism, unspecified: Secondary | ICD-10-CM | POA: Diagnosis not present

## 2022-04-28 DIAGNOSIS — R000 Tachycardia, unspecified: Secondary | ICD-10-CM | POA: Diagnosis not present

## 2022-04-28 LAB — HEMOGLOBIN A1C: Hemoglobin A1C: 7.2

## 2022-05-03 NOTE — Progress Notes (Unsigned)
Cardiology Office Note:    Date:  05/04/2022   ID:  [name] [name], DOB 1945/09/17, MRN [id number]  PCP:  Sheliah Hatch, MD  Cardiologist:  Little Ishikawa, MD  Electrophysiologist:  None   Referring MD: Sheliah Hatch, MD   Chief Complaint  Patient presents with   Shortness of Breath    History of Present Illness:    [name] [name] is a 77 y.o. female with a hx of T2DM, hyperlipidemia, hypothyroidism, melanoma who presents for follow-up.  She was referred by Dr. Beverely Low for evaluation of aortic atherosclerosis, initially seen on 08/29/2018.  Noted on CT abdomen pelvis 06/03/2020.  She reports that she does water aerobics 3 days/week, reports she has been developing shortness of breath and lightheadedness with exertion.  She denies any chest pain during these episodes.  However has been having episodes at night where she feels like her chest and arms are throbbing, lasts for 5 to 30 minutes.  Occurs most nights.  She denies any syncope or lower extremity edema.  Denies any palpitations.  She quit smoking at age 62.  No known history of heart disease in her immediate family.  Echocardiogram on 09/30/2020 showed normal biventricular function, grade 1 diastolic dysfunction, no significant valvular disease.  Lexiscan Myoview on 09/30/2020 showed normal perfusion, EF 69%.  Calcium score on 09/30/2020 was 213 (75th percentile).  Zio patch x 14 days 02/2022 showed 52 episodes of SVT, longest lasting 9 seconds with average rate 155 bpm.  Since last clinic visit, she reports she is doing okay.  States that she felt tired on Lopressor, has been doing better since switch to Toprol-XL.  States that recently she has been having shortness of breath with minimal exertion.  Denies any chest pain.  Reports has been trying to do water aerobics but getting very short of breath.  Past Medical History:  Diagnosis Date   Abdominal pain    EGD and CTs neg, rx Amitriptiline (non ulcer dyspepsia) EGD  12/11.. s/p ballon dilitation   Anxiety    Cataract    Cervical spondylosis without myelopathy 11/30/2012   Chronic conjunctivitis    Diabetes mellitus    no meds, diet controlled   Diverticulosis of colon    GERD with stricture    s/p dilatations   History of Lyme disease    HOH (hard of hearing)    bilateral hearing aids   Hypercholesterolemia    Hyperlipidemia    Hypothyroidism     Dx 10-9 TSH 5.2 , then TSH normalized, eventually went up again 2012   Melanoma    h/o   Memory difficulties 06/05/2014   Osteopenia    Osteoporosis    Solar lentigo    face; s/p excision 3/09. See Derm q 6 months   Trigeminal neuralgia    h/o     Past Surgical History:  Procedure Laterality Date   ABDOMINAL HYSTERECTOMY     ANKLE SURGERY  07/01/2015   R andkle tendon surgery   BREAST BIOPSY Left 1988   BREAST EXCISIONAL BIOPSY Left 1988   CATARACT EXTRACTION Bilateral    CERVICAL FUSION  06/2017   CHOLECYSTECTOMY     COLON RESECTION  1999   d/t diverticulitis   COLON SURGERY     COLONOSCOPY     ESOPHAGEAL MANOMETRY N/A 03/17/2019   Procedure: ESOPHAGEAL MANOMETRY (EM)  NO PH;  Surgeon: Shellia Cleverly, DO;  Location: [facility];  Service: Gastroenterology;  Laterality: N/A;   LUMBAR  DISC SURGERY  07/25/18, 09/20   melanoma resection Right     Right Arm Surgery, R facial surgery (05-2012)      PTX s/t chest tube     SHOULDER SURGERY     right   UPPER GASTROINTESTINAL ENDOSCOPY     WRIST SURGERY     removed melaoma, right    Current Medications: Current Meds  Medication Sig   ALPRAZolam (XANAX) 0.5 MG tablet TAKE 1 TABLET(0.5 MG) BY MOUTH AT BEDTIME AS NEEDED FOR SLEEP   amitriptyline (ELAVIL) 25 MG tablet Take 1 tablet (25 mg total) by mouth at bedtime.   aspirin 81 MG EC tablet Take 1 tablet (81 mg total) by mouth daily. Swallow whole.   AZASITE 1 % ophthalmic solution Apply 1 drop to eye as needed (dry eyes).   baclofen (LIORESAL) 10 MG tablet Take 10 mg by mouth 2 (two)  times daily. May take TID is needed   BAQSIMI ONE PACK 3 MG/DOSE POWD Place into both nostrils as needed (low blood sugar).   Cholecalciferol (VITAMIN D3) 2000 UNITS TABS Take 1 tablet by mouth daily.   Continuous Blood Gluc Receiver (DEXCOM G7 RECEIVER) DEVI by Does not apply route.   diclofenac Sodium (VOLTAREN) 1 % GEL Apply 2 g topically as needed.   gabapentin (NEURONTIN) 300 MG capsule TAKE TWO CAPSULES IN THE MORNING AND 2 CAPSULES AT BEDTIME   hyoscyamine (LEVSIN SL) 0.125 MG SL tablet Take 0.125 mg by mouth as needed.   Insulin Disposable Pump (OMNIPOD DASH INTRO, GEN 4,) KIT by Does not apply route. Change q 3 days   insulin lispro (HUMALOG) 100 UNIT/ML injection Inject into the skin.   levothyroxine (SYNTHROID, LEVOTHROID) 50 MCG tablet Take 50 mcg by mouth daily before breakfast. Take M-F and on Sat and Sun   Lifitegrast 5 % SOLN Apply 1 each to eye as needed.   Melatonin 5 MG TABS Take 1 tablet by mouth at bedtime.   metoprolol succinate (TOPROL XL) 25 MG 24 hr tablet Take 1 tablet (25 mg total) by mouth every evening.   Multiple Vitamin (MULTIVITAMIN) tablet Take 1 tablet by mouth daily.   pantoprazole (PROTONIX) 40 MG tablet TAKE 1 TABLET(40 MG) BY MOUTH DAILY   Peppermint Oil (IBGARD) 90 MG CPCR Take as directed as needed   polyethylene glycol (MIRALAX / GLYCOLAX) 17 g packet Take 34 g by mouth daily.   rosuvastatin (CRESTOR) 20 MG tablet TAKE 1 TABLET(20 MG) BY MOUTH DAILY     Allergies:   Rocephin [ceftriaxone], Prednisone, Ibandronic acid, Other, Tape, Tramadol hcl, Diphenhydramine hcl, Repaglinide, and Sitagliptin   Social History   Socioeconomic History   Marital status: Married    Spouse name: Ramon Dredge   Number of children: 2   Years of education: college   Highest education level: Not on file  Occupational History   Occupation: retired    Associate Professor: RETIRED  Tobacco Use   Smoking status: Former    Types: Cigarettes    Start date: 01/20/1976    Quit  date: 1981    Years since quitting: 43.3   Smokeless tobacco: Never  Vaping Use   Vaping Use: Never used  Substance and Sexual Activity   Alcohol use: Not Currently   Drug use: No   Sexual activity: Not Currently  Other Topics Concern   Not on file  Social History Narrative   Patient is married with 2 children.   Patient is right handed.   Patient has  college education.   Patient does not drink caffeine.          Social Determinants of Health   Financial Resource Strain: Low Risk  (09/18/2019)   Overall Financial Resource Strain (CARDIA)    Difficulty of Paying Living Expenses: Not hard at all  Food Insecurity: No Food Insecurity (09/18/2019)   Hunger Vital Sign    Worried About Running Out of Food in the Last Year: Never true    Ran Out of Food in the Last Year: Never true  Transportation Needs: No Transportation Needs (09/18/2019)   PRAPARE - Administrator, Civil Service (Medical): No    Lack of Transportation (Non-Medical): No  Physical Activity: Sufficiently Active (09/18/2019)   Exercise Vital Sign    Days of Exercise per Week: 3 days    Minutes of Exercise per Session: 50 min  Stress: No Stress Concern Present (09/18/2019)   [employer] of Occupational Health - Occupational Stress Questionnaire    Feeling of Stress : Only a little  Social Connections: Socially Integrated (09/18/2019)   Social Connection and Isolation Panel [NHANES]    Frequency of Communication with Friends and Family: More than three times a week    Frequency of Social Gatherings with Friends and Family: More than three times a week    Attends Religious Services: More than 4 times per year    Active Member of [employer] or Organizations: Yes    Attends Engineer, structural: More than 4 times per year    Marital Status: Married     Family History: The patient'sfamily history includes Endometrial cancer in her sister; Heart attack in her paternal grandfather; Melanoma in her  maternal uncle; Prostate cancer in her father; Stroke in her sister. There is no history of Colon cancer, Breast cancer, Diabetes, Stomach cancer, Rectal cancer, or Esophageal cancer.  ROS:   Please see the history of present illness.     All other systems reviewed and are negative.  EKGs/Labs/Other Studies Reviewed:    The following studies were reviewed today:   EKG:   02/27/21: Normal sinus rhythm, rate 84, biatrial enlargement   Recent Labs: 07/01/2021: Hemoglobin 13.5; Platelets 235.0; TSH 3.49 01/06/2022: ALT 14; BUN 27; Creatinine, Ser 0.89; Potassium 4.4; Sodium 138  Recent Lipid Panel    Component Value Date/Time   CHOL 173 01/06/2022 1129   CHOL 170 02/28/2021 0916   TRIG 58.0 01/06/2022 1129   TRIG 37 11/11/2005 0831   HDL 87.70 01/06/2022 1129   HDL 84 02/28/2021 0916   CHOLHDL 2 01/06/2022 1129   VLDL 11.6 01/06/2022 1129   LDLCALC 73 01/06/2022 1129   LDLCALC 76 02/28/2021 0916   LDLCALC 82 06/21/2020 1508   LDLDIRECT 148.1 06/11/2006 1458    Physical Exam:    VS:  BP 118/62   Pulse 68   Ht 5' 3.5" (1.613 m)   Wt 142 lb (64.4 kg)   SpO2 98%   BMI 24.76 kg/m     Wt Readings from Last 3 Encounters:  05/04/22 142 lb (64.4 kg)  04/03/22 141 lb (64 kg)  03/17/22 144 lb 8 oz (65.5 kg)     GEN:  Well nourished, well developed in no acute distress HEENT: Normal NECK: No JVD; No carotid bruits LYMPHATICS: No lymphadenopathy CARDIAC: RRR, no murmurs, rubs, gallops RESPIRATORY:  Clear to auscultation without rales, wheezing or rhonchi  ABDOMEN: Soft, non-tender, non-distended MUSCULOSKELETAL:  No edema; No deformity  SKIN: Warm and dry NEUROLOGIC:  Alert and oriented x 3 PSYCHIATRIC:  Normal affect   ASSESSMENT:    1. Shortness of breath   2. Palpitations   3. Hyperlipidemia, unspecified hyperlipidemia type      PLAN:    Chest pain/DOE: Chest pain is atypical in description, but also having dyspnea/lightheadedness with exertion.  Echocardiogram  on 09/30/2020 showed normal biventricular function, grade 1 diastolic dysfunction, no significant valvular disease.  Lexiscan Myoview on 09/30/2020 showed normal perfusion, EF 69%. -She is reporting worsening dyspnea on exertion.  Known CAD with calcium score 213 09/2020.  Recommend stress PET for further evaluation  Palpitations: Zio patch x 14 days 02/2022 showed 52 episodes of SVT, longest lasting 9 seconds with average rate 155 bpm.  Started on Toprol-XL 25 mg daily.  Reports improvement  Hyperlipidemia: On rosuvastatin 10 mg daily.  LDL 82 on 07/18/2020.  Calcium score on 09/30/2020 was 213 (75th percentile), rosuvastatin dose increased to 20 mg.  LDL 73 on 01/06/2022  T2DM: on insulin.  Most recent A1c 6.3% on 10/28/2021  RTC in 3 months   Shared Decision Making/Informed Consent The risks [chest pain, shortness of breath, cardiac arrhythmias, dizziness, blood pressure fluctuations, myocardial infarction, stroke/transient ischemic attack, nausea, vomiting, allergic reaction, radiation exposure, metallic taste sensation and life-threatening complications (estimated to be 1 in 10,000)], benefits (risk stratification, diagnosing coronary artery disease, treatment guidance) and alternatives of a cardiac PET stress test were discussed in detail with Ms. Heckmann and she agrees to proceed.    Medication Adjustments/Labs and Tests Ordered: Current medicines are reviewed at length with the patient today.  Concerns regarding medicines are outlined above.  Orders Placed This Encounter  Procedures   NM PET CT CARDIAC PERFUSION MULTI W/ABSOLUTE BLOODFLOW    No orders of the defined types were placed in this encounter.    Patient Instructions  Medication Instructions:  Your physician recommends that you continue on your current medications as directed. Please refer to the Current Medication list given to you today.  *If you need a refill on your cardiac medications before your next appointment,  please call your pharmacy*  Testing/Procedures: CARDIAC PET- Your physician has requested that you have a Cardiac Pet Stress Test. This testing is completed at [facility] ([address] [place], [place] [address]). The schedulers will call you to get this scheduled. Please follow instructions below and call the office with any questions/concerns [phone]).  Follow-Up: At [facility], you and your health needs are our priority.  As part of our continuing mission to provide you with exceptional heart care, we have created designated Provider Care Teams.  These Care Teams include your primary Cardiologist (physician) and Advanced Practice Providers (APPs -  Physician Assistants and Nurse Practitioners) who all work together to provide you with the care you need, when you need it.  We recommend signing up for the patient portal called "MyChart".  Sign up information is provided on this After Visit Summary.  MyChart is used to connect with patients for Virtual Visits (Telemedicine).  Patients are able to view lab/test results, encounter notes, upcoming appointments, etc.  Non-urgent messages can be sent to your provider as well.   To learn more about what you can do with MyChart, go to [email or web address].    Your next appointment:   3 month(s)  Provider:   Little Ishikawa, MD     Other Instructions How to Prepare for Your Cardiac PET/CT Stress Test:  1. Please do not take these medications before  your test:   Medications that may interfere with the cardiac pharmacological stress agent (ex. nitrates - including erectile dysfunction medications, isosorbide mononitrate, tamulosin or beta-blockers) the day of the exam. (Erectile dysfunction medication should be held for at least 72 hrs prior to test) Theophylline containing medications for 12 hours. Dipyridamole 48 hours prior to the test. Your remaining medications may be taken with water.  2.  Nothing to eat or drink, except water, 3 hours prior to arrival time.   NO caffeine/decaffeinated products, or chocolate 12 hours prior to arrival.  3. NO perfume, cologne or lotion  4. Total time is 1 to 2 hours; you may want to bring reading material for the waiting time.  5. Please report to Radiology at the [facility] Main Entrance 30 minutes early for your test.  [address] [place], [place] [address]  Diabetic Preparation:  Hold oral medications. You may take NPH and Lantus insulin. Do not take Humalog or Humulin R (Regular Insulin) the day of your test. Check blood sugars prior to leaving the house. If able to eat breakfast prior to 3 hour fasting, you may take all medications, including your insulin, Do not worry if you miss your breakfast dose of insulin - start at your next meal.  IF YOU THINK YOU MAY BE PREGNANT, OR ARE NURSING PLEASE INFORM THE TECHNOLOGIST.  In preparation for your appointment, medication and supplies will be purchased.  Appointment availability is limited, so if you need to cancel or reschedule, please call the Radiology Department at [phone]  24 hours in advance to avoid a cancellation fee of $100.00  What to Expect After you Arrive:  Once you arrive and check in for your appointment, you will be taken to a preparation room within the Radiology Department.  A technologist or Nurse will obtain your medical history, verify that you are correctly prepped for the exam, and explain the procedure.  Afterwards,  an IV will be started in your arm and electrodes will be placed on your skin for EKG monitoring during the stress portion of the exam. Then you will be escorted to the PET/CT scanner.  There, staff will get you positioned on the scanner and obtain a blood pressure and EKG.  During the exam, you will continue to be connected to the EKG and blood pressure machines.  A small, safe amount of a radioactive tracer will be injected in your IV to  obtain a series of pictures of your heart along with an injection of a stress agent.    After your Exam:  It is recommended that you eat a meal and drink a caffeinated beverage to counter act any effects of the stress agent.  Drink plenty of fluids for the remainder of the day and urinate frequently for the first couple of hours after the exam.  Your doctor will inform you of your test results within 7-10 business days.  For questions about your test or how to prepare for your test, please call: Rockwell Alexandria, Cardiac Imaging Nurse Navigator  Larey Brick, Cardiac Imaging Nurse Navigator Office: [phone]     Signed, Little Ishikawa, MD  05/04/2022 12:31 PM    [facility]

## 2022-05-04 ENCOUNTER — Ambulatory Visit: Payer: Medicare Other | Attending: Cardiology | Admitting: Cardiology

## 2022-05-04 ENCOUNTER — Encounter: Payer: Self-pay | Admitting: Cardiology

## 2022-05-04 VITALS — BP 118/62 | HR 68 | Ht 63.5 in | Wt 142.0 lb

## 2022-05-04 DIAGNOSIS — R002 Palpitations: Secondary | ICD-10-CM | POA: Diagnosis not present

## 2022-05-04 DIAGNOSIS — E785 Hyperlipidemia, unspecified: Secondary | ICD-10-CM | POA: Diagnosis not present

## 2022-05-04 DIAGNOSIS — R0602 Shortness of breath: Secondary | ICD-10-CM | POA: Diagnosis not present

## 2022-05-04 NOTE — Patient Instructions (Signed)
Medication Instructions:  Your physician recommends that you continue on your current medications as directed. Please refer to the Current Medication list given to you today.  *If you need a refill on your cardiac medications before your next appointment, please call your pharmacy*  Testing/Procedures: CARDIAC PET- Your physician has requested that you have a Cardiac Pet Stress Test. This testing is completed at [facility] ([address] [place], [place] [address]). The schedulers will call you to get this scheduled. Please follow instructions below and call the office with any questions/concerns [phone]).  Follow-Up: At [facility], you and your health needs are our priority.  As part of our continuing mission to provide you with exceptional heart care, we have created designated Provider Care Teams.  These Care Teams include your primary Cardiologist (physician) and Advanced Practice Providers (APPs -  Physician Assistants and Nurse Practitioners) who all work together to provide you with the care you need, when you need it.  We recommend signing up for the patient portal called "MyChart".  Sign up information is provided on this After Visit Summary.  MyChart is used to connect with patients for Virtual Visits (Telemedicine).  Patients are able to view lab/test results, encounter notes, upcoming appointments, etc.  Non-urgent messages can be sent to your provider as well.   To learn more about what you can do with MyChart, go to [email or web address].    Your next appointment:   3 month(s)  Provider:   Little Ishikawa, MD     Other Instructions How to Prepare for Your Cardiac PET/CT Stress Test:  1. Please do not take these medications before your test:   Medications that may interfere with the cardiac pharmacological stress agent (ex. nitrates - including erectile dysfunction medications, isosorbide mononitrate, tamulosin or beta-blockers)  the day of the exam. (Erectile dysfunction medication should be held for at least 72 hrs prior to test) Theophylline containing medications for 12 hours. Dipyridamole 48 hours prior to the test. Your remaining medications may be taken with water.  2. Nothing to eat or drink, except water, 3 hours prior to arrival time.   NO caffeine/decaffeinated products, or chocolate 12 hours prior to arrival.  3. NO perfume, cologne or lotion  4. Total time is 1 to 2 hours; you may want to bring reading material for the waiting time.  5. Please report to Radiology at the [facility] Main Entrance 30 minutes early for your test.  [address] [place], [place] [address]  Diabetic Preparation:  Hold oral medications. You may take NPH and Lantus insulin. Do not take Humalog or Humulin R (Regular Insulin) the day of your test. Check blood sugars prior to leaving the house. If able to eat breakfast prior to 3 hour fasting, you may take all medications, including your insulin, Do not worry if you miss your breakfast dose of insulin - start at your next meal.  IF YOU THINK YOU MAY BE PREGNANT, OR ARE NURSING PLEASE INFORM THE TECHNOLOGIST.  In preparation for your appointment, medication and supplies will be purchased.  Appointment availability is limited, so if you need to cancel or reschedule, please call the Radiology Department at [phone]  24 hours in advance to avoid a cancellation fee of $100.00  What to Expect After you Arrive:  Once you arrive and check in for your appointment, you will be taken to a preparation room within the Radiology Department.  A technologist or Nurse will obtain your  medical history, verify that you are correctly prepped for the exam, and explain the procedure.  Afterwards,  an IV will be started in your arm and electrodes will be placed on your skin for EKG monitoring during the stress portion of the exam. Then you will be escorted to the PET/CT scanner.   There, staff will get you positioned on the scanner and obtain a blood pressure and EKG.  During the exam, you will continue to be connected to the EKG and blood pressure machines.  A small, safe amount of a radioactive tracer will be injected in your IV to obtain a series of pictures of your heart along with an injection of a stress agent.    After your Exam:  It is recommended that you eat a meal and drink a caffeinated beverage to counter act any effects of the stress agent.  Drink plenty of fluids for the remainder of the day and urinate frequently for the first couple of hours after the exam.  Your doctor will inform you of your test results within 7-10 business days.  For questions about your test or how to prepare for your test, please call: Marchia Bond, Cardiac Imaging Nurse Navigator  Gordy Clement, Cardiac Imaging Nurse Navigator Office: [phone]

## 2022-05-05 DIAGNOSIS — L570 Actinic keratosis: Secondary | ICD-10-CM | POA: Diagnosis not present

## 2022-05-05 DIAGNOSIS — Z85820 Personal history of malignant melanoma of skin: Secondary | ICD-10-CM | POA: Diagnosis not present

## 2022-05-05 DIAGNOSIS — L578 Other skin changes due to chronic exposure to nonionizing radiation: Secondary | ICD-10-CM | POA: Diagnosis not present

## 2022-05-05 DIAGNOSIS — D225 Melanocytic nevi of trunk: Secondary | ICD-10-CM | POA: Diagnosis not present

## 2022-05-05 DIAGNOSIS — D2261 Melanocytic nevi of right upper limb, including shoulder: Secondary | ICD-10-CM | POA: Diagnosis not present

## 2022-05-05 DIAGNOSIS — L821 Other seborrheic keratosis: Secondary | ICD-10-CM | POA: Diagnosis not present

## 2022-05-05 DIAGNOSIS — D1801 Hemangioma of skin and subcutaneous tissue: Secondary | ICD-10-CM | POA: Diagnosis not present

## 2022-05-05 DIAGNOSIS — L812 Freckles: Secondary | ICD-10-CM | POA: Diagnosis not present

## 2022-05-20 DIAGNOSIS — E1165 Type 2 diabetes mellitus with hyperglycemia: Secondary | ICD-10-CM | POA: Diagnosis not present

## 2022-06-12 ENCOUNTER — Telehealth (HOSPITAL_COMMUNITY): Payer: Self-pay | Admitting: *Deleted

## 2022-06-12 NOTE — Telephone Encounter (Signed)
Reaching out to patient to offer assistance regarding upcoming cardiac imaging study; pt verbalizes understanding of appt date/time, parking situation and where to check in, pre-test NPO status, and verified current allergies; name and call back number provided for further questions should they arise  Armine Rizzolo RN Navigator Cardiac Imaging [facility] [phone] office [phone] cell  Patient aware to avoid caffeine 12 hours prior to her cardiac PET scan.  

## 2022-06-17 ENCOUNTER — Encounter (HOSPITAL_COMMUNITY)
Admission: RE | Admit: 2022-06-17 | Discharge: 2022-06-17 | Disposition: A | Discharge status: Home / Self Care | Payer: Medicare Other | Source: Ambulatory Visit | Attending: Cardiology | Admitting: Cardiology

## 2022-06-17 DIAGNOSIS — R0602 Shortness of breath: Secondary | ICD-10-CM | POA: Diagnosis not present

## 2022-06-17 LAB — NM PET CT CARDIAC PERFUSION MULTI W/ABSOLUTE BLOODFLOW
LV dias vol: 62 mL (ref 46–106)
LV sys vol: 16 mL
MBFR: 3.2
Nuc Rest EF: 68 %
Nuc Stress EF: 74 %
Peak HR: 88 {beats}/min
Rest HR: 75 {beats}/min
Rest MBF: 0.8 ml/g/min
Rest Nuclear Isotope Dose: 16.7 mCi
ST Depression (mm): 0 mm
Stress MBF: 2.56 ml/g/min
Stress Nuclear Isotope Dose: 16.7 mCi
TID: 0.9

## 2022-06-17 MED ORDER — REGADENOSON 0.4 MG/5ML IV SOLN
INTRAVENOUS | Status: AC
  Filled 2022-06-17: qty 5

## 2022-06-17 MED ORDER — REGADENOSON 0.4 MG/5ML IV SOLN
0.4000 mg | Freq: Once | INTRAVENOUS | Status: AC
  Administered 2022-06-17: 0.4 mg via INTRAVENOUS

## 2022-06-17 MED ORDER — RUBIDIUM RB82 GENERATOR (RUBYFILL)
16.7400 | PACK | Freq: Once | INTRAVENOUS | Status: AC
  Administered 2022-06-17: 16.74 via INTRAVENOUS

## 2022-06-17 MED ORDER — RUBIDIUM RB82 GENERATOR (RUBYFILL)
16.7200 | PACK | Freq: Once | INTRAVENOUS | Status: AC
  Administered 2022-06-17: 16.72 via INTRAVENOUS

## 2022-06-18 ENCOUNTER — Other Ambulatory Visit: Payer: Self-pay | Admitting: Family Medicine

## 2022-06-18 DIAGNOSIS — F411 Generalized anxiety disorder: Secondary | ICD-10-CM

## 2022-06-19 NOTE — Telephone Encounter (Signed)
Xanax 0.5 mg LOV: 01/06/22 Last Refill:02/23/22 Upcoming appt: 07/14/22

## 2022-06-19 NOTE — Telephone Encounter (Signed)
Pt aware of refill.

## 2022-06-20 DIAGNOSIS — E1165 Type 2 diabetes mellitus with hyperglycemia: Secondary | ICD-10-CM | POA: Diagnosis not present

## 2022-07-14 ENCOUNTER — Ambulatory Visit (INDEPENDENT_AMBULATORY_CARE_PROVIDER_SITE_OTHER): Payer: Medicare Other | Admitting: Family Medicine

## 2022-07-14 ENCOUNTER — Encounter: Payer: Self-pay | Admitting: Family Medicine

## 2022-07-14 ENCOUNTER — Ambulatory Visit: Payer: Medicare Other | Admitting: Family Medicine

## 2022-07-14 VITALS — BP 120/80 | HR 77 | Temp 98.1°F | Resp 18 | Ht 63.5 in | Wt 145.0 lb

## 2022-07-14 DIAGNOSIS — M810 Age-related osteoporosis without current pathological fracture: Secondary | ICD-10-CM

## 2022-07-14 DIAGNOSIS — E785 Hyperlipidemia, unspecified: Secondary | ICD-10-CM

## 2022-07-14 DIAGNOSIS — E1169 Type 2 diabetes mellitus with other specified complication: Secondary | ICD-10-CM | POA: Diagnosis not present

## 2022-07-14 DIAGNOSIS — Z794 Long term (current) use of insulin: Secondary | ICD-10-CM | POA: Diagnosis not present

## 2022-07-14 DIAGNOSIS — E1142 Type 2 diabetes mellitus with diabetic polyneuropathy: Secondary | ICD-10-CM

## 2022-07-14 LAB — LIPID PANEL
Cholesterol: 168 mg/dL (ref 0–200)
HDL: 76.7 mg/dL (ref >39.00)
LDL Cholesterol: 74 mg/dL (ref 0–99)
NonHDL: 90.9
Total CHOL/HDL Ratio: 2
Triglycerides: 83 mg/dL (ref 0.0–149.0)
VLDL: 16.6 mg/dL (ref 0.0–40.0)

## 2022-07-14 LAB — HEPATIC FUNCTION PANEL
ALT: 16 U/L (ref 0–35)
AST: 23 U/L (ref 0–37)
Albumin: 4.1 g/dL (ref 3.5–5.2)
Alkaline Phosphatase: 59 U/L (ref 39–117)
Bilirubin, Direct: 0.1 mg/dL (ref 0.0–0.3)
Total Bilirubin: 0.4 mg/dL (ref 0.2–1.2)
Total Protein: 6.8 g/dL (ref 6.0–8.3)

## 2022-07-14 LAB — CBC WITH DIFFERENTIAL/PLATELET
Basophils Absolute: 0 10*3/uL (ref 0.0–0.1)
Basophils Relative: 0.4 % (ref 0.0–3.0)
Eosinophils Absolute: 0.1 10*3/uL (ref 0.0–0.7)
Eosinophils Relative: 1.2 % (ref 0.0–5.0)
HCT: 39.8 % (ref 36.0–46.0)
Hemoglobin: 13.2 g/dL (ref 12.0–15.0)
Lymphocytes Relative: 23.6 % (ref 12.0–46.0)
Lymphs Abs: 1.4 10*3/uL (ref 0.7–4.0)
MCHC: 33.1 g/dL (ref 30.0–36.0)
MCV: 92.3 fl (ref 78.0–100.0)
Monocytes Absolute: 0.6 10*3/uL (ref 0.1–1.0)
Monocytes Relative: 10 % (ref 3.0–12.0)
Neutro Abs: 3.7 10*3/uL (ref 1.4–7.7)
Neutrophils Relative %: 64.8 % (ref 43.0–77.0)
Platelets: 213 10*3/uL (ref 150.0–400.0)
RBC: 4.31 Mil/uL (ref 3.87–5.11)
RDW: 13.3 % (ref 11.5–15.5)
WBC: 5.8 10*3/uL (ref 4.0–10.5)

## 2022-07-14 LAB — TSH: TSH: 2.39 u[IU]/mL (ref 0.35–5.50)

## 2022-07-14 LAB — BASIC METABOLIC PANEL
BUN: 29 mg/dL — ABNORMAL HIGH (ref 6–23)
CO2: 28 mEq/L (ref 19–32)
Calcium: 9.5 mg/dL (ref 8.4–10.5)
Chloride: 103 mEq/L (ref 96–112)
Creatinine, Ser: 0.89 mg/dL (ref 0.40–1.20)
GFR: 62.94 mL/min (ref >60.00)
Glucose, Bld: 165 mg/dL — ABNORMAL HIGH (ref 70–99)
Potassium: 4.1 mEq/L (ref 3.5–5.1)
Sodium: 139 mEq/L (ref 135–145)

## 2022-07-14 LAB — MICROALBUMIN / CREATININE URINE RATIO
Creatinine,U: 138.5 mg/dL
Microalb Creat Ratio: 0.6 mg/g (ref 0.0–30.0)
Microalb, Ur: 0.9 mg/dL (ref 0.0–1.9)

## 2022-07-14 LAB — VITAMIN D 25 HYDROXY (VIT D DEFICIENCY, FRACTURES): VITD: 47.01 ng/mL (ref 30.00–100.00)

## 2022-07-14 NOTE — Progress Notes (Signed)
   Subjective:    Patient ID: [name] [name], female    DOB: 11/15/1945, 77 y.o.   MRN: [id number]  HPI Hyperlipidemia- chronic problem.  On Crestor 20mg  daily.  Denies abd pain, N/V.  No CP.  + SOB.  DM- chronic problem.  Following w/ Dr Talmage Nap.  Last A1C 7.2%  Due for foot exam, microalbumin.  UTD on eye exam.  Currently on insulin.  Has known neuropathy.  No blisters or sores on feet.  Osteoporosis- pt is due for repeat DEXA.  Not currently on medication b/c Calcium upsets her stomach.  Goes to gym to help bones and takes Vit D.  Trying to avoid medication.   Review of Systems For ROS see HPI     Objective:   Physical Exam Vitals reviewed.  Constitutional:      General: [name].    Appearance: Normal appearance. [name]. She is not ill-appearing.  HENT:     Head: Normocephalic and atraumatic.  Eyes:     Conjunctiva/sclera: Conjunctivae normal.     Pupils: Pupils are equal, round, and reactive to light.  Neck:     Thyroid: No thyromegaly.  Cardiovascular:     Rate and Rhythm: Normal rate and regular rhythm.     Pulses: Normal pulses.     Heart sounds: Normal heart sounds. No murmur heard. Pulmonary:     Effort: Pulmonary effort is normal. No respiratory distress.     Breath sounds: Normal breath sounds.  Abdominal:     General: There is no distension.     Palpations: Abdomen is soft.     Tenderness: There is no abdominal tenderness.  Musculoskeletal:     Cervical back: Normal range of motion and neck supple.     Right lower leg: No edema.     Left lower leg: No edema.  Lymphadenopathy:     Cervical: No cervical adenopathy.  Skin:    General: Skin is warm and dry.  Neurological:     Mental Status: She is alert and oriented to person, place, and time.  Psychiatric:        Behavior: Behavior normal.           Assessment & Plan:

## 2022-07-14 NOTE — Assessment & Plan Note (Signed)
Pt's last DEXA in 2021 showed that she had progressed from Osteopenia to Osteoporosis.  At that time, she did not want to take medication due to GI issues.  At this time is taking Vit D and going to the gym regularly.  Repeat DEXA and determine if medication is needed.  Pt expressed understanding and is in agreement w/ plan.

## 2022-07-14 NOTE — Assessment & Plan Note (Addendum)
Chronic problem.  Tolerating statin w/o difficulty.  Currently on Crestor 20mg  daily.  Check labs.  Adjust meds prn

## 2022-07-14 NOTE — Assessment & Plan Note (Signed)
Chronic problem.  Following w/ Dr Talmage Nap.  A1C 7.2%  Currently asymptomatic w/ exception of SOB but she is seeing Cardiology for this.  UTD on eye exam.  Foot exam done today.  Will get microalbumin.

## 2022-07-14 NOTE — Patient Instructions (Signed)
Follow up in 6 months to recheck cholesterol We'll notify you of your lab results and make any changes if needed We'll call you to schedule your bone density test Call with any questions or concerns Stay Safe!  Stay Healthy! Have a great summer!!!

## 2022-07-15 ENCOUNTER — Telehealth: Payer: Self-pay

## 2022-07-15 NOTE — Telephone Encounter (Signed)
-----   Message from Sheliah Hatch, MD sent at 07/15/2022  7:25 AM EDT ----- Labs look great!  No changes at this time

## 2022-07-15 NOTE — Telephone Encounter (Signed)
Pt aware of lab results 

## 2022-07-20 DIAGNOSIS — E1165 Type 2 diabetes mellitus with hyperglycemia: Secondary | ICD-10-CM | POA: Diagnosis not present

## 2022-07-21 ENCOUNTER — Ambulatory Visit (INDEPENDENT_AMBULATORY_CARE_PROVIDER_SITE_OTHER)
Admission: RE | Admit: 2022-07-21 | Discharge: 2022-07-21 | Disposition: A | Discharge status: Home / Self Care | Payer: Medicare Other | Source: Ambulatory Visit | Attending: Family Medicine | Admitting: Family Medicine

## 2022-07-21 DIAGNOSIS — M810 Age-related osteoporosis without current pathological fracture: Secondary | ICD-10-CM

## 2022-07-24 ENCOUNTER — Telehealth: Payer: Self-pay

## 2022-07-24 NOTE — Telephone Encounter (Signed)
Pt seen results Via my chart  

## 2022-07-24 NOTE — Telephone Encounter (Signed)
-----   Message from Sheliah Hatch, MD sent at 07/23/2022 11:09 AM EDT ----- Your bone density shows statistically significant improvement!  This is great news!  No changes at this time

## 2022-08-09 NOTE — Progress Notes (Addendum)
Cardiology Office Note:    Date:  08/12/2022   ID:  [name] [name], DOB [birth year], MRN [id number]  PCP:  Sheliah Hatch, MD  Cardiologist:  Little Ishikawa, MD  Electrophysiologist:  None   Referring MD: Sheliah Hatch, MD   Chief Complaint  Patient presents with   Shortness of Breath    History of Present Illness:    [name] [name] is a 77 y.o. female with a hx of T2DM, hyperlipidemia, hypothyroidism, melanoma who presents for follow-up.  She was referred by Dr. Beverely [name] for evaluation of aortic atherosclerosis, initially seen on 08/29/2018.  Noted on CT abdomen pelvis 06/03/2020.  She reports that she does water aerobics 3 days/week, reports she has been developing shortness of breath and lightheadedness with exertion.  She denies any chest pain during these episodes.  However has been having episodes at night where she feels like her chest and arms are throbbing, lasts for 5 to 30 minutes.  Occurs most nights.  She denies any syncope or lower extremity edema.  Denies any palpitations.  She quit smoking at age 5.  No known history of heart disease in her immediate family.  Echocardiogram on 09/30/2020 showed normal biventricular function, grade 1 diastolic dysfunction, no significant valvular disease.  Lexiscan Myoview on 09/30/2020 showed normal perfusion, EF 69%.  Calcium score on 09/30/2020 was 213 (75th percentile).  Zio patch x 14 days 02/2022 showed 52 episodes of SVT, longest lasting 9 seconds with average rate 155 bpm.  Stress PET 06/17/2022 showed normal perfusion, normal myocardial blood flow reserve, EF 68%, moderate coronary calcifications.  Since last clinic visit, she reports she is doing okay.  She continues to have shortness of breath, occurs when she is doing her water aerobics.  She had some lightheadedness yesterday, denies any syncope.  Denies any chest pain or lower extremity edema.  Reports palpitations have significantly improved.  Past Medical History:   Diagnosis Date   Abdominal pain    EGD and CTs neg, rx Amitriptiline (non ulcer dyspepsia) EGD 12/11.. s/p ballon dilitation   Anxiety    Cataract    Cervical spondylosis without myelopathy 11/30/2012   Chronic conjunctivitis    Diabetes mellitus    no meds, diet controlled   Diverticulosis of colon    GERD with stricture    s/p dilatations   History of Lyme disease    HOH (hard of hearing)    bilateral hearing aids   Hypercholesterolemia    Hyperlipidemia    Hypothyroidism     Dx 10-9 TSH 5.2 , then TSH normalized, eventually went up again 2012   Melanoma [facility])    h/o   Memory difficulties 06/05/2014   Osteopenia    Osteoporosis    Solar lentigo    face; s/p excision 3/09. See Derm q 6 months   Trigeminal neuralgia    h/o     Past Surgical History:  Procedure Laterality Date   ABDOMINAL HYSTERECTOMY     ANKLE SURGERY  07/01/2015   R andkle tendon surgery   BREAST BIOPSY Left 1988   BREAST EXCISIONAL BIOPSY Left 1988   CATARACT EXTRACTION Bilateral    CERVICAL FUSION  06/2017   CHOLECYSTECTOMY     COLON RESECTION  1999   d/t diverticulitis   COLON SURGERY     COLONOSCOPY     ESOPHAGEAL MANOMETRY N/A 03/17/2019   Procedure: ESOPHAGEAL MANOMETRY (EM)  NO PH;  Surgeon: [name] Cleverly, DO;  Location: [facility];  Service: Gastroenterology;  Laterality: N/A;   LUMBAR DISC SURGERY  07/25/18, 09/20   melanoma resection Right     Right Arm Surgery, R facial surgery (05-2012)      PTX s/t chest tube     SHOULDER SURGERY     right   UPPER GASTROINTESTINAL ENDOSCOPY     WRIST SURGERY     removed melaoma, right    Current Medications: Current Meds  Medication Sig   ALPRAZolam (XANAX) 0.5 MG tablet TAKE 1 TABLET(0.5 MG) BY MOUTH AT BEDTIME AS NEEDED FOR SLEEP   amitriptyline (ELAVIL) 25 MG tablet Take 1 tablet (25 mg total) by mouth at bedtime.   aspirin 81 MG EC tablet Take 1 tablet (81 mg total) by mouth daily. Swallow whole.   AZASITE 1 % ophthalmic solution  Apply 1 drop to eye as needed (dry eyes).   baclofen (LIORESAL) 10 MG tablet Take 10 mg by mouth 2 (two) times daily. May take TID is needed   BAQSIMI ONE PACK 3 MG/DOSE POWD Place into both nostrils as needed ([name] blood sugar).   Cholecalciferol (VITAMIN D3) 2000 UNITS TABS Take 1 tablet by mouth daily.   Continuous Blood Gluc Receiver (DEXCOM G7 RECEIVER) DEVI by Does not apply route.   diclofenac Sodium (VOLTAREN) 1 % GEL Apply 2 g topically as needed.   gabapentin (NEURONTIN) 300 MG capsule TAKE TWO CAPSULES IN THE MORNING AND 2 CAPSULES AT BEDTIME   hyoscyamine (LEVSIN SL) 0.125 MG SL tablet Take 0.125 mg by mouth as needed.   Insulin Disposable Pump (OMNIPOD DASH INTRO, GEN 4,) KIT by Does not apply route. Change q 3 days   insulin lispro (HUMALOG) 100 UNIT/ML injection Inject into the skin.   levothyroxine (SYNTHROID, LEVOTHROID) 50 MCG tablet Take 50 mcg by mouth daily before breakfast. Take M-F and on Sat and Sun   Lifitegrast 5 % SOLN Apply 1 each to eye as needed.   Melatonin 5 MG TABS Take 1 tablet by mouth at bedtime.   metoprolol succinate (TOPROL XL) 25 MG 24 hr tablet Take 1 tablet (25 mg total) by mouth every evening.   Multiple Vitamin (MULTIVITAMIN) tablet Take 1 tablet by mouth daily.   pantoprazole (PROTONIX) 40 MG tablet TAKE 1 TABLET(40 MG) BY MOUTH DAILY   Peppermint Oil (IBGARD) 90 MG CPCR Take as directed as needed   polyethylene glycol (MIRALAX / GLYCOLAX) 17 g packet Take 34 g by mouth daily.   rosuvastatin (CRESTOR) 20 MG tablet TAKE 1 TABLET(20 MG) BY MOUTH DAILY     Allergies:   Rocephin [ceftriaxone], Prednisone, Ibandronic acid, Other, Tape, Tramadol hcl, Diphenhydramine hcl, Repaglinide, and Sitagliptin   Social History   Socioeconomic History   Marital status: Married    Spouse name: [name] [name]   Number of children: 2   Years of education: college   Highest education level: 12th grade  Occupational History   Occupation: retired    Associate Professor:  RETIRED  Tobacco Use   Smoking status: Former    Current packs/day: 0.00    Types: Cigarettes    Start date: 01/20/1976    Quit date: 1981    Years since quitting: 43.5   Smokeless tobacco: Never  Vaping Use   Vaping status: Never Used  Substance and Sexual Activity   Alcohol use: Not Currently   Drug use: No   Sexual activity: Not Currently  Other Topics Concern   Not on file  Social History Narrative   Patient is married  with 2 children.   Patient is right handed.   Patient has college education.   Patient does not drink caffeine.          Social Determinants of Health   Financial Resource Strain: [name] Risk  (09/18/2019)   Overall Financial Resource Strain (CARDIA)    Difficulty of Paying Living Expenses: Not hard at all  Food Insecurity: No Food Insecurity (07/13/2022)   Hunger Vital Sign    Worried About Running Out of Food in the Last Year: Never true    Ran Out of Food in the Last Year: Never true  Transportation Needs: No Transportation Needs (07/13/2022)   PRAPARE - Administrator, Civil Service (Medical): No    Lack of Transportation (Non-Medical): No  Physical Activity: Sufficiently Active (09/18/2019)   Exercise Vital Sign    Days of Exercise per Week: 3 days    Minutes of Exercise per Session: 50 min  Stress: No Stress Concern Present (09/18/2019)   [employer] of Occupational Health - Occupational Stress Questionnaire    Feeling of Stress : Only a little  Social Connections: Socially Integrated (07/13/2022)   Social Connection and Isolation Panel [NHANES]    Frequency of Communication with Friends and Family: More than three times a week    Frequency of Social Gatherings with Friends and Family: Three times a week    Attends Religious Services: More than 4 times per year    Active Member of Clubs or Organizations: Yes    Attends Engineer, structural: More than 4 times per year    Marital Status: Married     Family History: The  patient'sfamily history includes Endometrial cancer in her sister; Heart attack in her paternal grandfather; Melanoma in her maternal uncle; Prostate cancer in her father; Stroke in her sister. There is no history of Colon cancer, Breast cancer, Diabetes, Stomach cancer, Rectal cancer, or Esophageal cancer.  ROS:   Please see the history of present illness.     All other systems reviewed and are negative.  EKGs/Labs/Other Studies Reviewed:    The following studies were reviewed today:   EKG:   02/27/21: Normal sinus rhythm, rate 84, biatrial enlargement 08/12/22: Normal sinus rhythm, rate 73, right atrial enlargement   Recent Labs: 07/14/2022: ALT 16; BUN 29; Creatinine, Ser 0.89; Hemoglobin 13.2; Platelets 213.0; Potassium 4.1; Sodium 139; TSH 2.39  Recent Lipid Panel    Component Value Date/Time   CHOL 168 07/14/2022 0941   CHOL 170 02/28/2021 0916   TRIG 83.0 07/14/2022 0941   TRIG 37 11/11/2005 0831   HDL 76.70 07/14/2022 0941   HDL 84 02/28/2021 0916   CHOLHDL 2 07/14/2022 0941   VLDL 16.6 07/14/2022 0941   LDLCALC 74 07/14/2022 0941   LDLCALC 76 02/28/2021 0916   LDLCALC 82 06/21/2020 1508   LDLDIRECT 148.1 06/11/2006 1458    Physical Exam:    VS:  BP (!) 100/52   Pulse 73   Ht 5\' 3"  (1.6 m)   Wt 148 lb 9.6 oz (67.4 kg)   SpO2 95%   BMI 26.32 kg/m     Wt Readings from Last 3 Encounters:  08/12/22 148 lb 9.6 oz (67.4 kg)  07/14/22 145 lb (65.8 kg)  05/04/22 142 lb (64.4 kg)     GEN:  Well nourished, well developed in no acute distress HEENT: Normal NECK: No JVD; No carotid bruits LYMPHATICS: No lymphadenopathy CARDIAC: RRR, no murmurs, rubs, gallops RESPIRATORY:  Clear to auscultation without  rales, wheezing or rhonchi  ABDOMEN: Soft, non-tender, non-distended MUSCULOSKELETAL:  No edema; No deformity  SKIN: Warm and dry NEUROLOGIC:  Alert and oriented x 3 PSYCHIATRIC:  Normal affect   ASSESSMENT:    1. Coronary artery disease involving native coronary  artery of native heart without angina pectoris   2. SOB (shortness of breath)   3. Palpitations   4. Hyperlipidemia, unspecified hyperlipidemia type       PLAN:    Chest pain/DOE: Chest pain is atypical in description, but also having dyspnea/lightheadedness with exertion.  Echocardiogram on 09/30/2020 showed normal biventricular function, grade 1 diastolic dysfunction, no significant valvular disease.  Lexiscan Myoview on 09/30/2020 showed normal perfusion, EF 69%.  Stress PET 06/17/2022 showed normal perfusion, normal myocardial blood flow reserve, EF 68%, moderate coronary calcifications. -Suspect shortness of breath is noncardiac in etiology.  Will check BNP.  Palpitations: Zio patch x 14 days 02/2022 showed 52 episodes of SVT, longest lasting 9 seconds with average rate 155 bpm.  Started on Toprol-XL 25 mg daily.  Reports improvement  Hyperlipidemia: On rosuvastatin 10 mg daily.  LDL 82 on 07/18/2020.  Calcium score on 09/30/2020 was 213 (75th percentile), rosuvastatin dose increased to 20 mg.  LDL 74 on 07/14/2022  T2DM: on insulin.  Most recent A1c 7.2% on 04/28/2022  Addendum Patient called in that while on vacation was noted to have loud snoring and observed apnea events.  Will order sleep study to evaluate for OSA.  RTC in 6 months    Medication Adjustments/Labs and Tests Ordered: Current medicines are reviewed at length with the patient today.  Concerns regarding medicines are outlined above.  Orders Placed This Encounter  Procedures   B Nat Peptide   EKG 12-Lead    No orders of the defined types were placed in this encounter.    Patient Instructions  Medication Instructions:  Continue same medications *If you need a refill on your cardiac medications before your next appointment, please call your pharmacy*   Lab Work: BNP today   Testing/Procedures: None ordered   Follow-Up: At [facility], you and your health needs are our priority.  As part of our  continuing mission to provide you with exceptional heart care, we have created designated Provider Care Teams.  These Care Teams include your primary Cardiologist (physician) and Advanced Practice Providers (APPs -  Physician Assistants and Nurse Practitioners) who all work together to provide you with the care you need, when you need it.  We recommend signing up for the patient portal called "MyChart".  Sign up information is provided on this After Visit Summary.  MyChart is used to connect with patients for Virtual Visits (Telemedicine).  Patients are able to view lab/test results, encounter notes, upcoming appointments, etc.  Non-urgent messages can be sent to your provider as well.   To learn more about what you can do with MyChart, go to [email or web address].    Your next appointment:  6 months    Call in Sept to schedule Jan appointment     Provider:  Dr.Lenisha Lacap     Signed, Little Ishikawa, MD  08/12/2022 9:45 AM    [facility]

## 2022-08-12 ENCOUNTER — Ambulatory Visit: Payer: Medicare Other | Attending: Cardiology | Admitting: Cardiology

## 2022-08-12 ENCOUNTER — Encounter: Payer: Self-pay | Admitting: Cardiology

## 2022-08-12 VITALS — BP 100/52 | HR 73 | Ht 63.0 in | Wt 148.6 lb

## 2022-08-12 DIAGNOSIS — R002 Palpitations: Secondary | ICD-10-CM | POA: Insufficient documentation

## 2022-08-12 DIAGNOSIS — I2510 Atherosclerotic heart disease of native coronary artery without angina pectoris: Secondary | ICD-10-CM | POA: Diagnosis not present

## 2022-08-12 DIAGNOSIS — E785 Hyperlipidemia, unspecified: Secondary | ICD-10-CM | POA: Diagnosis not present

## 2022-08-12 DIAGNOSIS — R0602 Shortness of breath: Secondary | ICD-10-CM | POA: Insufficient documentation

## 2022-08-12 NOTE — Patient Instructions (Signed)
Medication Instructions:  Continue same medications *If you need a refill on your cardiac medications before your next appointment, please call your pharmacy*   Lab Work: BNP today   Testing/Procedures: None ordered   Follow-Up: At [facility], you and your health needs are our priority.  As part of our continuing mission to provide you with exceptional heart care, we have created designated Provider Care Teams.  These Care Teams include your primary Cardiologist (physician) and Advanced Practice Providers (APPs -  Physician Assistants and Nurse Practitioners) who all work together to provide you with the care you need, when you need it.  We recommend signing up for the patient portal called "MyChart".  Sign up information is provided on this After Visit Summary.  MyChart is used to connect with patients for Virtual Visits (Telemedicine).  Patients are able to view lab/test results, encounter notes, upcoming appointments, etc.  Non-urgent messages can be sent to your provider as well.   To learn more about what you can do with MyChart, go to [email or web address].    Your next appointment:  6 months    Call in Sept to schedule Jan appointment     Provider:  Dr.Schumann

## 2022-08-13 LAB — BRAIN NATRIURETIC PEPTIDE: BNP: 31.3 pg/mL (ref 0.0–100.0)

## 2022-08-20 DIAGNOSIS — E1165 Type 2 diabetes mellitus with hyperglycemia: Secondary | ICD-10-CM | POA: Diagnosis not present

## 2022-08-31 DIAGNOSIS — H0288B Meibomian gland dysfunction left eye, upper and lower eyelids: Secondary | ICD-10-CM | POA: Diagnosis not present

## 2022-08-31 DIAGNOSIS — H0102A Squamous blepharitis right eye, upper and lower eyelids: Secondary | ICD-10-CM | POA: Diagnosis not present

## 2022-08-31 DIAGNOSIS — H31101 Choroidal degeneration, unspecified, right eye: Secondary | ICD-10-CM | POA: Diagnosis not present

## 2022-08-31 DIAGNOSIS — Z961 Presence of intraocular lens: Secondary | ICD-10-CM | POA: Diagnosis not present

## 2022-08-31 DIAGNOSIS — H0102B Squamous blepharitis left eye, upper and lower eyelids: Secondary | ICD-10-CM | POA: Diagnosis not present

## 2022-08-31 DIAGNOSIS — E119 Type 2 diabetes mellitus without complications: Secondary | ICD-10-CM | POA: Diagnosis not present

## 2022-08-31 DIAGNOSIS — H0288A Meibomian gland dysfunction right eye, upper and lower eyelids: Secondary | ICD-10-CM | POA: Diagnosis not present

## 2022-08-31 LAB — HM DIABETES EYE EXAM

## 2022-09-01 ENCOUNTER — Other Ambulatory Visit: Payer: Self-pay | Admitting: Adult Health

## 2022-09-20 DIAGNOSIS — E1165 Type 2 diabetes mellitus with hyperglycemia: Secondary | ICD-10-CM | POA: Diagnosis not present

## 2022-09-21 ENCOUNTER — Encounter: Payer: Self-pay | Admitting: Cardiology

## 2022-09-22 ENCOUNTER — Other Ambulatory Visit: Payer: Self-pay

## 2022-09-22 DIAGNOSIS — R0683 Snoring: Secondary | ICD-10-CM

## 2022-09-22 NOTE — Telephone Encounter (Signed)
Can we order an Itamar sleep study on her?

## 2022-09-28 ENCOUNTER — Telehealth: Payer: Self-pay | Admitting: Cardiology

## 2022-09-28 DIAGNOSIS — N189 Chronic kidney disease, unspecified: Secondary | ICD-10-CM | POA: Diagnosis not present

## 2022-09-28 DIAGNOSIS — Z794 Long term (current) use of insulin: Secondary | ICD-10-CM | POA: Diagnosis not present

## 2022-09-28 DIAGNOSIS — Z9641 Presence of insulin pump (external) (internal): Secondary | ICD-10-CM | POA: Diagnosis not present

## 2022-09-28 DIAGNOSIS — G609 Hereditary and idiopathic neuropathy, unspecified: Secondary | ICD-10-CM | POA: Diagnosis not present

## 2022-09-28 DIAGNOSIS — E1065 Type 1 diabetes mellitus with hyperglycemia: Secondary | ICD-10-CM | POA: Diagnosis not present

## 2022-09-28 DIAGNOSIS — E7800 Pure hypercholesterolemia, unspecified: Secondary | ICD-10-CM | POA: Diagnosis not present

## 2022-09-28 DIAGNOSIS — E039 Hypothyroidism, unspecified: Secondary | ICD-10-CM | POA: Diagnosis not present

## 2022-09-28 DIAGNOSIS — R300 Dysuria: Secondary | ICD-10-CM | POA: Diagnosis not present

## 2022-09-28 DIAGNOSIS — E049 Nontoxic goiter, unspecified: Secondary | ICD-10-CM | POA: Diagnosis not present

## 2022-09-28 NOTE — Telephone Encounter (Signed)
Itamar sleep study ordered 09/22/22. Advised patient that this test has to be pre-authorized with insurance first and then she will be contacted about coming to pick up device/review instructions. Sent her a Clinical cytogeneticist message with general overview/instructions for test.

## 2022-09-28 NOTE — Telephone Encounter (Signed)
New Message:       Patient is calling o see what the status is for her Home Sleep Study?

## 2022-10-18 ENCOUNTER — Encounter: Payer: Self-pay | Admitting: Cardiology

## 2022-10-18 ENCOUNTER — Other Ambulatory Visit: Payer: Self-pay | Admitting: Cardiology

## 2022-10-18 ENCOUNTER — Other Ambulatory Visit: Payer: Self-pay | Admitting: Family Medicine

## 2022-10-18 DIAGNOSIS — F411 Generalized anxiety disorder: Secondary | ICD-10-CM

## 2022-10-19 NOTE — Telephone Encounter (Signed)
Patient is requesting a refill of the following medications: Requested Prescriptions   Pending Prescriptions Disp Refills   ALPRAZolam (XANAX) 0.5 MG tablet [Pharmacy Med Name: ALPRAZOLAM 0.5MG  TABLETS] 30 tablet     Sig: TAKE 1 TABLET(0.5 MG) BY MOUTH AT BEDTIME AS NEEDED FOR SLEEP    Date of patient request: 10/19/22 Last office visit: 07/14/22 Date of last refill: 06/19/22 Last refill amount: 30 Follow up time period per chart: 6 months

## 2022-10-20 DIAGNOSIS — E1165 Type 2 diabetes mellitus with hyperglycemia: Secondary | ICD-10-CM | POA: Diagnosis not present

## 2022-11-04 DIAGNOSIS — D225 Melanocytic nevi of trunk: Secondary | ICD-10-CM | POA: Diagnosis not present

## 2022-11-04 DIAGNOSIS — I788 Other diseases of capillaries: Secondary | ICD-10-CM | POA: Diagnosis not present

## 2022-11-04 DIAGNOSIS — L812 Freckles: Secondary | ICD-10-CM | POA: Diagnosis not present

## 2022-11-04 DIAGNOSIS — Z85820 Personal history of malignant melanoma of skin: Secondary | ICD-10-CM | POA: Diagnosis not present

## 2022-11-04 DIAGNOSIS — D1801 Hemangioma of skin and subcutaneous tissue: Secondary | ICD-10-CM | POA: Diagnosis not present

## 2022-11-20 DIAGNOSIS — E1165 Type 2 diabetes mellitus with hyperglycemia: Secondary | ICD-10-CM | POA: Diagnosis not present

## 2022-12-01 DIAGNOSIS — Z23 Encounter for immunization: Secondary | ICD-10-CM | POA: Diagnosis not present

## 2022-12-08 ENCOUNTER — Telehealth: Payer: Self-pay

## 2022-12-08 NOTE — Telephone Encounter (Signed)
**Note De-Identified Dereck Agerton Obfuscation** Ordering provider: Dr Bjorn Pippin Associated diagnoses: Snoring-R06.83 WatchPAT PA obtained on 12/08/2022 by Tamirra Sienkiewicz, Lorelle Formosa, LPN. Authorization: Per the Medicare website:  Prior Authorization requests for Sleep Study codes (52841) does not require a Prior Authorization.  The patient's husband and DPR, Ramon Dredge notified of PIN ([id number]) on 12/08/2022 Samariyah Cowles phone. He stated that the pt has not been provided a sleep study device yet.  While on the phone with Ramon Dredge, I s/w Ashley Mariner, Sleep Coordinator who stated that she will provide the pt a device if she can come by Dr Astrid Divine office anytime M-F 8 am -4:30 pm to pick it up. I advised Ramon Dredge of this and he stated that he will tell the pt. He verbalized understanding to all information given and thanked me for my call.   Phone note routed to covering staff for follow-up.

## 2022-12-08 NOTE — Telephone Encounter (Signed)
Patient profile initialized in CloudPAT on 12/08/2022 by Ashley Mariner, LPN. Device serial number: [id number]  Patient has not picked up device yet, will update note, once device is picked up.

## 2022-12-09 ENCOUNTER — Encounter: Payer: Self-pay | Admitting: Cardiology

## 2022-12-14 ENCOUNTER — Telehealth: Payer: Self-pay

## 2022-12-14 NOTE — Telephone Encounter (Signed)
Notified patient that I spoke with Itamar representative about data interferences with patient's Dexcom 7 and Omni POD. Itamar representative assured me that it is safe to use [facility] Iv Sleep Test and accurate sleep information can be pulled from test results.

## 2022-12-20 DIAGNOSIS — E1065 Type 1 diabetes mellitus with hyperglycemia: Secondary | ICD-10-CM | POA: Diagnosis not present

## 2022-12-25 NOTE — Telephone Encounter (Signed)
Spoke to patient about [name] [name]. Patient verbalizes she has not started the [place], because she had just got her nails done before Thanksgiving. Patient verbalizes she understands how to use the device and has the PIN and will do the WatchPat study next Thursday 12/31/22.

## 2022-12-31 ENCOUNTER — Encounter (INDEPENDENT_AMBULATORY_CARE_PROVIDER_SITE_OTHER): Payer: Self-pay | Admitting: Cardiology

## 2022-12-31 DIAGNOSIS — G4733 Obstructive sleep apnea (adult) (pediatric): Secondary | ICD-10-CM

## 2023-01-03 NOTE — Progress Notes (Unsigned)
01/05/23- 77 yoF referred by Cardiology/ Dr Bjorn Pippin with concern of possible OSA. She was to have an [employer], but raised concern as to whether her Dexcom and Omnipod devices might interfere with Bluetooth. Medical problems include Aortic Atheroscclerosis, GERD/stricture, Diverticulitis, DM2, Goiter, Hyperlipidemia,  Trigeminal Neuralgia, CKD, Neuropathy, Anxiety/Insomnia,  -----Snoring, fatigue during day, witnessed apneas x 6 years.  Patient had a WatchPat sleep test done 12/31/2022 ordered by cardiologist Dr. Bjorn Pippin. Discussed the use of AI scribe software for clinical note transcription with the patient, who gave verbal consent to proceed.  History of Present Illness   The patient, with a history of spontaneous pneumothorax, pleurisy, and a collapsed lung due to a misplaced feeding tube during a colon resection surgery, presents with concerns of possible sleep apnea and shortness of breath. The sleep apnea concern arose during a family vacation when the patient's future daughter-in-law, an EMT, noticed the patient's loud snoring and periods of apnea. The patient reports that if she is not actively engaged in an activity, she tends to fall asleep, especially when reading. She takes melatonin to help her sleep but does not consume caffeine due to its hyperactive effects on her.  The patient also reports shortness of breath with exertion, such as walking her dog or going up her driveway. She notes that this has been a gradual issue over the past couple of years and has not been associated with any specific event. She also mentions a persistent dry, unproductive cough that she has had for years. The patient quit smoking at age 74 after her lung collapsed and has not smoked since. She does not report any wheezing or coughing up anything.   Prior to Admission medications   Medication Sig Start Date End Date Taking? Authorizing Provider  ALPRAZolam (XANAX) 0.5 MG tablet TAKE 1 TABLET(0.5 MG)  BY MOUTH AT BEDTIME AS NEEDED FOR SLEEP 10/19/22  Yes Sheliah Hatch, MD  amitriptyline (ELAVIL) 25 MG tablet Take 1 tablet (25 mg total) by mouth at bedtime. 03/17/22  Yes Lomax, Amy, NP  aspirin 81 MG EC tablet Take 1 tablet (81 mg total) by mouth daily. Swallow whole. 06/21/20  Yes Sheliah Hatch, MD  AZASITE 1 % ophthalmic solution Apply 1 drop to eye as needed (dry eyes). 03/03/22  Yes [provider]  baclofen (LIORESAL) 10 MG tablet Take 10 mg by mouth 2 (two) times daily. May take TID is needed   Yes [provider]  BAQSIMI ONE PACK 3 MG/DOSE POWD Place into both nostrils as needed (low blood sugar). 10/28/21  Yes [provider]  Cholecalciferol (VITAMIN D3) 2000 UNITS TABS Take 1 tablet by mouth daily.   Yes [provider]  Continuous Blood Gluc Receiver (DEXCOM G7 RECEIVER) DEVI by Does not apply route.   Yes [provider]  diclofenac Sodium (VOLTAREN) 1 % GEL Apply 2 g topically as needed.   Yes [provider]  Fluticasone-Umeclidin-Vilant (TRELEGY ELLIPTA) 100-62.5-25 MCG/ACT AEPB Inhale 1 puff into the lungs daily. 01/05/23  Yes Saber Dickerman, Joni Fears D, MD  gabapentin (NEURONTIN) 300 MG capsule TAKE TWO CAPSULES IN THE MORNING AND 2 CAPSULES AT BEDTIME 03/17/22  Yes Lomax, Amy, NP  hyoscyamine (LEVSIN SL) 0.125 MG SL tablet Take 0.125 mg by mouth as needed. 01/04/20  Yes Armbruster, Willaim Rayas, MD  Insulin Disposable Pump (OMNIPOD DASH INTRO, GEN 4,) KIT by Does not apply route. Change q 3 days   Yes [provider]  insulin lispro (HUMALOG) 100 UNIT/ML  injection Inject into the skin. 04/22/22  Yes [provider]  Lancets (ONETOUCH DELICA PLUS LANCET33G) MISC Apply topically 2 (two) times daily. 11/04/22  Yes [provider]  levothyroxine (SYNTHROID, LEVOTHROID) 50 MCG tablet Take 50 mcg by mouth daily before breakfast. Take M-F and on Sat and Sun 03/02/16  Yes [provider]   Lifitegrast 5 % SOLN Apply 1 each to eye as needed.   Yes [provider]  Melatonin 5 MG TABS Take 1 tablet by mouth at bedtime.   Yes [provider]  metoprolol succinate (TOPROL-XL) 25 MG 24 hr tablet TAKE 1 TABLET(25 MG) BY MOUTH EVERY EVENING 09/01/22  Yes Jodelle Gross, NP  Multiple Vitamin (MULTIVITAMIN) tablet Take 1 tablet by mouth daily.   Yes [provider]  [facility] VERIO test strip 2 (two) times daily. 11/02/22  Yes [provider]  pantoprazole (PROTONIX) 40 MG tablet TAKE 1 TABLET(40 MG) BY MOUTH DAILY 10/19/22  Yes Shade Flood, MD  Peppermint Oil (IBGARD) 90 MG CPCR Take as directed as needed 04/01/20  Yes Armbruster, Willaim Rayas, MD  polyethylene glycol (MIRALAX / GLYCOLAX) 17 g packet Take 34 g by mouth daily.   Yes [provider]  rosuvastatin (CRESTOR) 20 MG tablet TAKE 1 TABLET(20 MG) BY MOUTH DAILY 10/20/22  Yes Little Ishikawa, MD   Past Medical History:  Diagnosis Date   Abdominal pain    EGD and CTs neg, rx Amitriptiline (non ulcer dyspepsia) EGD 12/11.. s/p ballon dilitation   Anxiety    Cataract    Cervical spondylosis without myelopathy 11/30/2012   Chronic conjunctivitis    Diabetes mellitus    no meds, diet controlled   Diverticulosis of colon    GERD with stricture    s/p dilatations   History of Lyme disease    HOH (hard of hearing)    bilateral hearing aids   Hypercholesterolemia    Hyperlipidemia    Hypothyroidism     Dx 10-9 TSH 5.2 , then TSH normalized, eventually went up again 2012   Melanoma [facility])    h/o   Memory difficulties 06/05/2014   Osteopenia    Osteoporosis    Solar lentigo    face; s/p excision 3/09. See Derm q 6 months   Trigeminal neuralgia    h/o    Past Surgical History:  Procedure Laterality Date   ABDOMINAL HYSTERECTOMY     ANKLE SURGERY  07/01/2015   R andkle tendon surgery   BREAST BIOPSY Left 1988   BREAST EXCISIONAL BIOPSY Left 1988   CATARACT  EXTRACTION Bilateral    CERVICAL FUSION  06/2017   CHOLECYSTECTOMY     COLON RESECTION  1999   d/t diverticulitis   COLON SURGERY     COLONOSCOPY     ESOPHAGEAL MANOMETRY N/A 03/17/2019   Procedure: ESOPHAGEAL MANOMETRY (EM)  NO PH;  Surgeon: Shellia Cleverly, DO;  Location: [facility];  Service: Gastroenterology;  Laterality: N/A;   LUMBAR DISC SURGERY  07/25/18, 09/20   melanoma resection Right     Right Arm Surgery, R facial surgery (05-2012)      PTX s/t chest tube     SHOULDER SURGERY     right   UPPER GASTROINTESTINAL ENDOSCOPY     WRIST SURGERY     removed melaoma, right   Family History  Problem Relation Age of Onset   Prostate cancer Father    Endometrial cancer Sister  clear cell carcinoma   Stroke Sister    Heart attack Paternal Grandfather        GF in his 26s   Melanoma Maternal Uncle    Colon cancer Neg Hx    Breast cancer Neg Hx    Diabetes Neg Hx    Stomach cancer Neg Hx    Rectal cancer Neg Hx    Esophageal cancer Neg Hx    Social History   Socioeconomic History   Marital status: Married    Spouse name: Ramon Dredge   Number of children: 2   Years of education: college   Highest education level: 12th grade  Occupational History   Occupation: retired    Associate Professor: RETIRED  Tobacco Use   Smoking status: Former    Current packs/day: 0.00    Types: Cigarettes    Start date: 01/20/1976    Quit date: 1981    Years since quitting: 43.9   Smokeless tobacco: Never  Vaping Use   Vaping status: Never Used  Substance and Sexual Activity   Alcohol use: Not Currently   Drug use: No   Sexual activity: Not Currently  Other Topics Concern   Not on file  Social History Narrative   Patient is married with 2 children.   Patient is right handed.   Patient has college education.   Patient does not drink caffeine.          Social Drivers of Corporate investment banker Strain: Low Risk  (09/18/2019)   Overall Financial Resource Strain (CARDIA)     Difficulty of Paying Living Expenses: Not hard at all  Food Insecurity: No Food Insecurity (07/13/2022)   Hunger Vital Sign    Worried About Running Out of Food in the Last Year: Never true    Ran Out of Food in the Last Year: Never true  Transportation Needs: No Transportation Needs (07/13/2022)   PRAPARE - Administrator, Civil Service (Medical): No    Lack of Transportation (Non-Medical): No  Physical Activity: Sufficiently Active (09/18/2019)   Exercise Vital Sign    Days of Exercise per Week: 3 days    Minutes of Exercise per Session: 50 min  Stress: No Stress Concern Present (09/18/2019)   [employer] of Occupational Health - Occupational Stress Questionnaire    Feeling of Stress : Only a little  Social Connections: Socially Integrated (07/13/2022)   Social Connection and Isolation Panel [NHANES]    Frequency of Communication with Friends and Family: More than three times a week    Frequency of Social Gatherings with Friends and Family: Three times a week    Attends Religious Services: More than 4 times per year    Active Member of Clubs or Organizations: Yes    Attends Banker Meetings: More than 4 times per year    Marital Status: Married  Catering manager Violence: Not At Risk (09/18/2019)   Humiliation, Afraid, Rape, and Kick questionnaire    Fear of Current or Ex-Partner: No    Emotionally Abused: No    Physically Abused: No    Sexually Abused: No   ROS-see HPI  + = positive Constitutional:    weight loss, night sweats, fevers, chills, fatigue, lassitude. HEENT:    headaches, +difficulty swallowing, tooth/dental problems, sore throat,       sneezing, itching, ear ache, nasal congestion, post nasal drip, snoring CV:    chest pain, orthopnea, PND, swelling in lower extremities, anasarca,  dizziness, palpitations Resp:   +shortness of breath with exertion or at rest.                productive cough,   non-productive  cough, coughing up of blood.              change in color of mucus.  wheezing.   Skin:    rash or lesions. GI: + heartburn, indigestion, abdominal pain, nausea, vomiting, diarrhea,                 change in bowel habits, loss of appetite GU: dysuria, change in color of urine, no urgency or frequency.   flank pain. MS:   joint pain, stiffness, decreased range of motion, back pain. Neuro-     nothing unusual Psych:  change in mood or affect.  depression or +anxiety.   memory loss.  OBJ- Physical Exam General- Alert, Oriented, Affect-appropriate, Distress- none acute Skin- rash-none, lesions- none, excoriation- none Lymphadenopathy- none Head- atraumatic            Eyes- Gross vision intact, PERRLA, conjunctivae and secretions clear            Ears- Hearing, canals-normal            Nose- Clear, no-Septal dev, mucus, polyps, erosion, perforation             Throat- Mallampati III , mucosa clear , drainage- none, tonsils- atrophic, +teeth Neck- flexible , trachea midline, no stridor , thyroid nl, carotid no bruit Chest - symmetrical excursion , unlabored           Heart/CV- RRR , no murmur , no gallop  , no rub, nl s1 s2                           - JVD- none , edema- none, stasis changes- none, varices- none           Lung- clear to P&A, wheeze- none, cough- none , dullness-none, rub- none           Chest wall-  Abd-  Br/ Gen/ Rectal- Not done, not indicated Extrem- cyanosis- none, clubbing, none, atrophy- none, strength- nl Neuro- grossly intact to observation   Assessment and Plan    Possible Sleep Apnea Reported loud snoring and periods of apnea during sleep, as observed by others. A WATCHPAT study was conducted but results are not yet available. -Obtain results of WATCHPAT study from Dr. Campbell Lerner office for review.  Shortness of Breath Chronic, progressive shortness of breath with exertion. History of spontaneous pneumothorax, pleurisy, and lung injury from misplaced feeding  tube. No recent imaging or pulmonary function tests. -Order chest x-ray. -Schedule pulmonary function tests. -Start trial of Trelegy inhaler, one puff daily in the morning, and monitor for improvement in symptoms.

## 2023-01-05 ENCOUNTER — Telehealth: Payer: Self-pay | Admitting: Cardiology

## 2023-01-05 ENCOUNTER — Ambulatory Visit: Payer: Medicare Other | Admitting: Internal Medicine

## 2023-01-05 ENCOUNTER — Encounter: Payer: Self-pay | Admitting: Internal Medicine

## 2023-01-05 ENCOUNTER — Ambulatory Visit: Payer: Medicare Other

## 2023-01-05 VITALS — BP 118/74 | HR 83 | Temp 97.5°F | Ht 64.0 in | Wt 151.8 lb

## 2023-01-05 DIAGNOSIS — R0600 Dyspnea, unspecified: Secondary | ICD-10-CM | POA: Diagnosis not present

## 2023-01-05 DIAGNOSIS — R0609 Other forms of dyspnea: Secondary | ICD-10-CM | POA: Diagnosis not present

## 2023-01-05 DIAGNOSIS — G4733 Obstructive sleep apnea (adult) (pediatric): Secondary | ICD-10-CM

## 2023-01-05 MED ORDER — TRELEGY ELLIPTA 100-62.5-25 MCG/ACT IN AEPB: 1.0000 | INHALATION_SPRAY | Freq: Every day | RESPIRATORY_TRACT | Status: DC

## 2023-01-05 NOTE — Patient Instructions (Signed)
Order- contact Dr Campbell Lerner office- cardiology for report of the Watchpat sleep study  Order- CXR    dx dyspnea on exertion  Order- schedule PFT

## 2023-01-05 NOTE — Assessment & Plan Note (Signed)
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 I don't hear crackles to suggest ILD, but may consider HRCT. NBo hx PE or cardiac event other than palpitations, or anemia. Plan- PFT, CXR, trial Trelegy

## 2023-01-05 NOTE — Assessment & Plan Note (Signed)
Suspect OSA Appropriate discussion done Plan- requesting report from Watchpat HST.  Possible intervention discussed.

## 2023-01-05 NOTE — Telephone Encounter (Signed)
Amy from [facility] is wanting sleep study results fax to them. [phone]

## 2023-01-15 ENCOUNTER — Ambulatory Visit: Payer: Medicare Other | Attending: Cardiology

## 2023-01-15 DIAGNOSIS — R0683 Snoring: Secondary | ICD-10-CM

## 2023-01-15 NOTE — Procedures (Signed)
   SLEEP STUDY REPORT Patient Information Study Date: 12/31/2022 Patient Name: [name] [name] Patient ID: [id number] Birth Date: 1945-04-20 Age: 77 Gender: Female BMI: 26.2 (W=148 lb, H=5' 3'') Stopbang: 4 Referring Physician: Epifanio Lesches, MD  TEST DESCRIPTION: Home sleep apnea testing was completed using the WatchPat, a Type 1 device, utilizing  peripheral arterial tonometry (PAT), chest movement, actigraphy, pulse oximetry, pulse rate, body position and snore.  AHI was calculated with apnea and hypopnea using valid sleep time as the denominator. RDI includes apneas,  hypopneas, and RERAs. The data acquired and the scoring of sleep and all associated events were performed in  accordance with the recommended standards and specifications as outlined in the AASM Manual for the Scoring of  Sleep and Associated Events 2.2.0 (2015).  FINDINGS:  1. Moderate Obstructive Sleep Apnea with AHI 18.4/hr.   2. No Central Sleep Apnea with pAHIc 0.6/hr.  3. Oxygen desaturations as low as 70%.  4. Severe snoring was present. O2 sats were < 88% for 15.4 min.  5. Total sleep time was 6 hrs and 24 min.  6. 21.8% of total sleep time was spent in REM sleep.   7. Shortened sleep onset latency at 6 min  8. Shortened REM sleep onset latency at 75 min.   9. Total awakenings were 7.  10. Arrhythmia detection: None  DIAGNOSIS:  Moderate Obstructive Sleep Apnea (G47.33) Nocturnal Hypoxemia  RECOMMENDATIONS: 1. Clinical correlation of these findings is necessary. The decision to treat obstructive sleep apnea (OSA) is usually  based on the presence of apnea symptoms or the presence of associated medical conditions such as Hypertension,  Congestive Heart Failure, Atrial Fibrillation or Obesity. The most common symptoms of OSA are snoring, gasping for  breath while sleeping, daytime sleepiness and fatigue.   2. Initiating apnea therapy is recommended given the presence of symptoms and/or associated  conditions.  Recommend proceeding with one of the following:   a. Auto-CPAP therapy with a pressure range of 5-20cm H2O.   b. An oral appliance (OA) that can be obtained from certain dentists with expertise in sleep medicine. These are  primarily of use in non-obese patients with mild and moderate disease.   c. An ENT consultation which may be useful to look for specific causes of obstruction and possible treatment  options.   d. If patient is intolerant to PAP therapy, consider referral to ENT for evaluation for hypoglossal nerve stimulator.   3. Close follow-up is necessary to ensure success with CPAP or oral appliance therapy for maximum benefit .  4. A follow-up oximetry study on CPAP is recommended to assess the adequacy of therapy and determine the need  for supplemental oxygen or the potential need for Bi-level therapy. An arterial blood gas to determine the adequacy of  baseline ventilation and oxygenation should also be considered.  5. Healthy sleep recommendations include: adequate nightly sleep (normal 7-9 hrs/night), avoidance of caffeine after  noon and alcohol near bedtime, and maintaining a sleep environment that is cool, dark and quiet.  6. Weight loss for overweight patients is recommended. Even modest amounts of weight loss can significantly  improve the severity of sleep apnea.  7. Snoring recommendations include: weight loss where appropriate, side sleeping, and avoidance of alcohol before  bed.  8. Operation of motor vehicle should not be performed when sleepy.  Signature: Armanda Magic, MD; [facility]; Diplomat, American Board of Sleep  Medicine Electronically Signed: 01/15/2023 3:31:18 PM

## 2023-01-18 ENCOUNTER — Encounter: Payer: Self-pay | Admitting: Family Medicine

## 2023-01-18 ENCOUNTER — Ambulatory Visit (INDEPENDENT_AMBULATORY_CARE_PROVIDER_SITE_OTHER): Payer: Medicare Other | Admitting: Family Medicine

## 2023-01-18 VITALS — BP 128/74 | HR 80 | Temp 97.5°F | Wt 151.4 lb

## 2023-01-18 DIAGNOSIS — E1142 Type 2 diabetes mellitus with diabetic polyneuropathy: Secondary | ICD-10-CM | POA: Diagnosis not present

## 2023-01-18 DIAGNOSIS — E663 Overweight: Secondary | ICD-10-CM | POA: Insufficient documentation

## 2023-01-18 DIAGNOSIS — E785 Hyperlipidemia, unspecified: Secondary | ICD-10-CM

## 2023-01-18 DIAGNOSIS — E1169 Type 2 diabetes mellitus with other specified complication: Secondary | ICD-10-CM

## 2023-01-18 LAB — CBC WITH DIFFERENTIAL/PLATELET
Basophils Absolute: 0 10*3/uL (ref 0.0–0.1)
Basophils Relative: 0.3 % (ref 0.0–3.0)
Eosinophils Absolute: 0.1 10*3/uL (ref 0.0–0.7)
Eosinophils Relative: 1.4 % (ref 0.0–5.0)
HCT: 40.4 % (ref 36.0–46.0)
Hemoglobin: 13.3 g/dL (ref 12.0–15.0)
Lymphocytes Relative: 25.5 % (ref 12.0–46.0)
Lymphs Abs: 1.4 10*3/uL (ref 0.7–4.0)
MCHC: 33 g/dL (ref 30.0–36.0)
MCV: 93.9 fL (ref 78.0–100.0)
Monocytes Absolute: 0.6 10*3/uL (ref 0.1–1.0)
Monocytes Relative: 10.1 % (ref 3.0–12.0)
Neutro Abs: 3.5 10*3/uL (ref 1.4–7.7)
Neutrophils Relative %: 62.7 % (ref 43.0–77.0)
Platelets: 224 10*3/uL (ref 150.0–400.0)
RBC: 4.31 Mil/uL (ref 3.87–5.11)
RDW: 13.1 % (ref 11.5–15.5)
WBC: 5.7 10*3/uL (ref 4.0–10.5)

## 2023-01-18 LAB — LIPID PANEL
Cholesterol: 189 mg/dL (ref 0–200)
HDL: 87.5 mg/dL (ref >39.00)
LDL Cholesterol: 88 mg/dL (ref 0–99)
NonHDL: 101.69
Total CHOL/HDL Ratio: 2
Triglycerides: 67 mg/dL (ref 0.0–149.0)
VLDL: 13.4 mg/dL (ref 0.0–40.0)

## 2023-01-18 LAB — HEPATIC FUNCTION PANEL
ALT: 16 U/L (ref 0–35)
AST: 22 U/L (ref 0–37)
Albumin: 4.4 g/dL (ref 3.5–5.2)
Alkaline Phosphatase: 70 U/L (ref 39–117)
Bilirubin, Direct: 0.1 mg/dL (ref 0.0–0.3)
Total Bilirubin: 0.3 mg/dL (ref 0.2–1.2)
Total Protein: 7 g/dL (ref 6.0–8.3)

## 2023-01-18 LAB — BASIC METABOLIC PANEL
BUN: 23 mg/dL (ref 6–23)
CO2: 32 meq/L (ref 19–32)
Calcium: 9.5 mg/dL (ref 8.4–10.5)
Chloride: 101 meq/L (ref 96–112)
Creatinine, Ser: 0.93 mg/dL (ref 0.40–1.20)
GFR: 59.49 mL/min — ABNORMAL LOW (ref >60.00)
Glucose, Bld: 200 mg/dL — ABNORMAL HIGH (ref 70–99)
Potassium: 4.4 meq/L (ref 3.5–5.1)
Sodium: 140 meq/L (ref 135–145)

## 2023-01-18 LAB — HEMOGLOBIN A1C: Hgb A1c MFr Bld: 7.7 % — ABNORMAL HIGH (ref 4.6–6.5)

## 2023-01-18 LAB — TSH: TSH: 3.02 u[IU]/mL (ref 0.35–5.50)

## 2023-01-18 NOTE — Assessment & Plan Note (Signed)
Chronic problem.  Following w/ Dr Talmage Nap.  On Humalog.  UTD on foot exam, eye exam, microalbumin.  Due for A1C.  Last A1C w/ Dr Talmage Nap 6.4%

## 2023-01-18 NOTE — Progress Notes (Signed)
   Subjective:    Patient ID: [name] [name], female    DOB: 1945/10/19, 77 y.o.   MRN: [id number]  HPI Hyperlipidemia- chronic problem, on Crestor 20mg  daily.  No CP.  Has seen Pulmonary for SOB.  Denies abd pain, N/V.  DM- chronic problem, following w/ Endo.  On Humalog.  UTD on foot exam, microalbumin.  Due for A1C.  Has eye exams q6 months at [facility]- last done 08/31/22.    Overweight- pt has gained 6 lbs since last visit.  BMI now 25.99.  Going to the gym regularly.  Pt was recently started on Trelegy for SOB and she feels that she is able to walk the dog and be more active w/o difficulty.   Review of Systems For ROS see HPI     Objective:   Physical Exam Vitals reviewed.  Constitutional:      General: [name].    Appearance: Normal appearance. [name]. She is not ill-appearing.  HENT:     Head: Normocephalic and atraumatic.  Eyes:     Conjunctiva/sclera: Conjunctivae normal.     Pupils: Pupils are equal, round, and reactive to light.  Neck:     Thyroid: No thyromegaly.  Cardiovascular:     Rate and Rhythm: Normal rate and regular rhythm.     Pulses: Normal pulses.     Heart sounds: Normal heart sounds. No murmur heard. Pulmonary:     Effort: Pulmonary effort is normal. No respiratory distress.     Breath sounds: Normal breath sounds.  Abdominal:     General: There is no distension.     Palpations: Abdomen is soft.     Tenderness: There is no abdominal tenderness.  Musculoskeletal:     Cervical back: Normal range of motion and neck supple.     Right lower leg: No edema.     Left lower leg: No edema.  Lymphadenopathy:     Cervical: No cervical adenopathy.  Skin:    General: Skin is warm and dry.  Neurological:     General: No focal deficit present.     Mental Status: She is alert and oriented to person, place, and time.  Psychiatric:        Mood and Affect: Mood normal.        Behavior: Behavior normal.        Thought  Content: Thought content normal.           Assessment & Plan:

## 2023-01-18 NOTE — Assessment & Plan Note (Signed)
Chronic problem.  On Crestor 20mg daily w/o difficulty.  Check labs.  Adjust meds prn  

## 2023-01-18 NOTE — Assessment & Plan Note (Signed)
New.  Pt has gained 6 lbs since last visit.  She is exercising regularly.  Following low carb diet.  Will follow.

## 2023-01-18 NOTE — Patient Instructions (Signed)
Follow up in 6 months to recheck BP and cholesterol We'll notify you of your lab results and make any changes if needed Continue to work on healthy diet and regular exercise- you're doing great! Call with any questions or concerns Stay Safe!  Stay Healthy! Happy [employer] Birthday!!! Happy New Year!

## 2023-01-19 ENCOUNTER — Telehealth: Payer: Self-pay

## 2023-01-19 NOTE — Telephone Encounter (Signed)
-----   Message from Neena Rhymes sent at 01/19/2023  7:40 AM EST ----- A1C has increased to 7.7%  This is likely due to holiday eating.  Please get back on track for the new year.  No changes at this time.  Remainder of labs look great!

## 2023-01-19 NOTE — Telephone Encounter (Signed)
Pt has reviewed via MyChart

## 2023-01-25 ENCOUNTER — Ambulatory Visit: Payer: Self-pay | Admitting: Family Medicine

## 2023-01-25 NOTE — Telephone Encounter (Signed)
 FYI

## 2023-01-25 NOTE — Telephone Encounter (Signed)
 Appt with Worthy Rancher 01/26/23

## 2023-01-25 NOTE — Telephone Encounter (Signed)
   Chief Complaint: high blood sugar Symptoms: high blood sugar reading, cough, congestion Frequency: the past few days Pertinent Negatives: Patient denies fever, difficulty breathing Disposition: [] ED /[] Urgent Care (no appt availability in office) / [] Appointment(In office/virtual)/ []  [place] [facility]/ [x] Home Care/ [] Refused Recommended Disposition /[] [place] [facility]/ []  Follow-up with PCP Additional Notes: Patient's husband called in reporting that patient did have a higher than normal blood sugar reading this morning, of 224. Husband reports patient has been sick with a cough and congestion the last few days and it has been causing her sugar to be all over the place. Patient was previously scheduled for in office appt tomorrow 1/7 for cold symptoms. This RN advised husband it is okay to keep appt tomorrow based on current symptoms/protocol. This RN advised to call back with worsening symptoms. Husband verbalized understanding.    Copied from CRM [id number]. Topic: Clinical - Red Word Triage >> Jan 25, 2023  9:45 AM Joen NOVAK wrote: Red Word that prompted transfer to Nurse Triage: Patient complains of cough and congestion. Patient is diabetic and feels that her numbers are high due to those symptoms. Right now her reading is 224. The husband is on the line Edward Reason for Disposition  Blood glucose 70-240 mg/dL (3.9 -86.6 mmol/L)  Answer Assessment - Initial Assessment Questions 1. BLOOD GLUCOSE: What is your blood glucose level?      224 2. ONSET: When did you check the blood glucose?     This morning, the last few days she's been high due to being sick 3. USUAL RANGE: What is your glucose level usually? (e.g., usual fasting morning value, usual evening value)     Anywhere from 60-200 4. KETONES: Do you check for ketones (urine or blood test strips)? If Yes, ask: What does the test show now?      none 5. TYPE 1 or 2:  Do you know what type of diabetes you have?   (e.g., Type 1, Type 2, Gestational; doesn't know)      Type 2 6. INSULIN : Do you take insulin ? What type of insulin (s) do you use? What is the mode of delivery? (syringe, pen; injection or pump)?      She uses a patch pump 7. DIABETES PILLS: Do you take any pills for your diabetes? If Yes, ask: Have you missed taking any pills recently?     Yes, I don't remember the name 8. OTHER SYMPTOMS: Do you have any symptoms? (e.g., fever, frequent urination, difficulty breathing, dizziness, weakness, vomiting)     Cough, congestion  Protocols used: Diabetes - High Blood Sugar-A-AH

## 2023-01-26 ENCOUNTER — Telehealth: Payer: Self-pay

## 2023-01-26 ENCOUNTER — Other Ambulatory Visit: Payer: Self-pay

## 2023-01-26 ENCOUNTER — Encounter (HOSPITAL_BASED_OUTPATIENT_CLINIC_OR_DEPARTMENT_OTHER): Payer: Self-pay | Admitting: Emergency Medicine

## 2023-01-26 ENCOUNTER — Ambulatory Visit: Payer: Medicare Other | Admitting: Family

## 2023-01-26 ENCOUNTER — Emergency Department (HOSPITAL_BASED_OUTPATIENT_CLINIC_OR_DEPARTMENT_OTHER)
Admission: EM | Admit: 2023-01-26 | Discharge: 2023-01-26 | Disposition: A | Discharge status: Home / Self Care | Payer: Medicare Other | Attending: Emergency Medicine | Admitting: Emergency Medicine

## 2023-01-26 DIAGNOSIS — R059 Cough, unspecified: Secondary | ICD-10-CM | POA: Diagnosis present

## 2023-01-26 DIAGNOSIS — J0110 Acute frontal sinusitis, unspecified: Secondary | ICD-10-CM | POA: Diagnosis not present

## 2023-01-26 DIAGNOSIS — Z7982 Long term (current) use of aspirin: Secondary | ICD-10-CM | POA: Insufficient documentation

## 2023-01-26 DIAGNOSIS — Z79899 Other long term (current) drug therapy: Secondary | ICD-10-CM | POA: Diagnosis not present

## 2023-01-26 DIAGNOSIS — Z794 Long term (current) use of insulin: Secondary | ICD-10-CM | POA: Diagnosis not present

## 2023-01-26 DIAGNOSIS — B9789 Other viral agents as the cause of diseases classified elsewhere: Secondary | ICD-10-CM | POA: Diagnosis not present

## 2023-01-26 DIAGNOSIS — Z20822 Contact with and (suspected) exposure to covid-19: Secondary | ICD-10-CM | POA: Diagnosis not present

## 2023-01-26 DIAGNOSIS — J069 Acute upper respiratory infection, unspecified: Secondary | ICD-10-CM | POA: Insufficient documentation

## 2023-01-26 DIAGNOSIS — R0602 Shortness of breath: Secondary | ICD-10-CM | POA: Diagnosis not present

## 2023-01-26 LAB — RESP PANEL BY RT-PCR (RSV, FLU A&B, COVID)  RVPGX2
Influenza A by PCR: NEGATIVE
Influenza B by PCR: NEGATIVE
Resp Syncytial Virus by PCR: NEGATIVE
SARS Coronavirus 2 by RT PCR: POSITIVE — AB

## 2023-01-26 MED ORDER — AMOXICILLIN-POT CLAVULANATE 875-125 MG PO TABS
1.0000 | ORAL_TABLET | Freq: Once | ORAL | Status: AC
  Administered 2023-01-26: 1 via ORAL
  Filled 2023-01-26: qty 1

## 2023-01-26 MED ORDER — BENZONATATE 100 MG PO CAPS: 100.0000 mg | ORAL_CAPSULE | Freq: Three times a day (TID) | ORAL | 0 refills | Status: DC

## 2023-01-26 MED ORDER — ONDANSETRON 4 MG PO TBDP: ORAL_TABLET | ORAL | 0 refills | Status: DC

## 2023-01-26 MED ORDER — AMOXICILLIN-POT CLAVULANATE 875-125 MG PO TABS: 1.0000 | ORAL_TABLET | Freq: Two times a day (BID) | ORAL | 0 refills | Status: DC

## 2023-01-26 NOTE — ED Notes (Addendum)
 Discharge paperwork given and verbally understood.... Provider aware of all V/S before DC and cleared.Marland KitchenMarland Kitchen

## 2023-01-26 NOTE — Telephone Encounter (Signed)
 Called patient to discuss and decideded on an app for Monday 02/01/2023 for [facility] follow up.   Copied from CRM [id number]. Topic: Clinical - Medical Advice >> Jan 26, 2023  1:06 PM Corin V wrote: Reason for CRM: Patient tested positive for COVID at the ER today. ER advised that she call to see if she needs to see Dr. Mahlon for a follow up. ER prescribed an antibiotic and cough medication (pill) and a medication for nausea. Patient is having aches, a cough, sore throat, a fever yesterday (no temperature today).

## 2023-01-26 NOTE — Telephone Encounter (Signed)
-----   Message from Wilbert Bihari sent at 01/15/2023  3:32 PM EST ----- Please let patient know that they have sleep apnea.  Recommend therapeutic CPAP titration for treatment of patient's sleep disordered breathing.  If unable to perform an in lab titration then initiate ResMed auto CPAP from 4 to 15cm H2O with heated humidity and mask of choice and overnight pulse ox on CPAP.

## 2023-01-26 NOTE — ED Notes (Signed)
 Pt was discharged by the provider before assessment could be performed.[name] KitchenMarland [name]

## 2023-01-26 NOTE — ED Provider Notes (Signed)
 [place] EMERGENCY DEPARTMENT AT [facility] Provider Note   CSN: [id number] Arrival date & time: 01/26/23  0849     History  Chief Complaint  Patient presents with   Cough    [name] [name] is a 78 y.o. female.  78 yo F with a chief complaints of cough congestion and sore throat.  Going on for about 4 days now.  She was recently started on trilogy and started having issues with her blood sugar and so she stopped taking it and since then has been sick.  She was wondering if this had to do with stopping her medication.  No known sick contacts no recent travel.  Cough is worse at night.   Cough      Home Medications Prior to Admission medications   Medication Sig Start Date End Date Taking? Authorizing Provider  amoxicillin -clavulanate (AUGMENTIN ) 875-125 MG tablet Take 1 tablet by mouth every 12 (twelve) hours. 01/26/23  Yes Emil Share, DO  benzonatate  (TESSALON ) 100 MG capsule Take 1 capsule (100 mg total) by mouth every 8 (eight) hours. 01/26/23  Yes Emil Share, DO  ondansetron  (ZOFRAN -ODT) 4 MG disintegrating tablet 4mg  ODT q4 hours prn nausea/vomit 01/26/23  Yes Maleki Hippe, DO  ALPRAZolam  (XANAX ) 0.5 MG tablet TAKE 1 TABLET(0.5 MG) BY MOUTH AT BEDTIME AS NEEDED FOR SLEEP 10/19/22   Tabori, Katherine E, MD  amitriptyline  (ELAVIL ) 25 MG tablet Take 1 tablet (25 mg total) by mouth at bedtime. 03/17/22   Lomax, Amy, NP  aspirin  81 MG EC tablet Take 1 tablet (81 mg total) by mouth daily. Swallow whole. 06/21/20   Tabori, Katherine E, MD  AZASITE  1 % ophthalmic solution Apply 1 drop to eye as needed (dry eyes). 03/03/22   [provider]  baclofen (LIORESAL) 10 MG tablet Take 10 mg by mouth 2 (two) times daily. May take TID is needed    [provider]  BAQSIMI ONE PACK 3 MG/DOSE POWD Place into both nostrils as needed (low blood sugar). 10/28/21   [provider]  Cholecalciferol (VITAMIN D3) 2000 UNITS TABS Take 1 tablet by mouth daily.    [provider]  Continuous Blood Gluc Receiver (DEXCOM G7 RECEIVER) DEVI by Does not apply route.    [provider]  diclofenac Sodium (VOLTAREN) 1 % GEL Apply 2 g topically as needed.    [provider]  Fluticasone -Umeclidin-Vilant (TRELEGY ELLIPTA ) 100-62.5-25 MCG/ACT AEPB Inhale 1 puff into the lungs daily. 01/05/23   Neysa Rama D, MD  gabapentin  (NEURONTIN ) 300 MG capsule TAKE TWO CAPSULES IN THE MORNING AND 2 CAPSULES AT BEDTIME 03/17/22   Lomax, Amy, NP  hyoscyamine  (LEVSIN  SL) 0.125 MG SL tablet Take 0.125 mg by mouth as needed. 01/04/20   Armbruster, Elspeth SQUIBB, MD  Insulin  Disposable Pump (OMNIPOD DASH INTRO, GEN 4,) KIT by Does not apply route. Change q 3 days    [provider]  insulin  lispro (HUMALOG) 100 UNIT/ML injection Inject into the skin. 04/22/22   [provider]  Lancets (ONETOUCH DELICA PLUS [place]) MISC Apply topically 2 (two) times daily. 11/04/22   [provider]  levothyroxine (SYNTHROID, LEVOTHROID) 50 MCG tablet Take 50 mcg by mouth daily before breakfast. Take 50mcg M-F and 100mcg on Sat and Sun 03/02/16   [provider]  Lifitegrast 5 % SOLN Apply 1 each to eye as needed.    [provider]  Melatonin 5 MG TABS Take 1 tablet by mouth at bedtime.  [provider]  metoprolol  succinate (TOPROL -XL) 25 MG 24 hr tablet TAKE 1 TABLET(25 MG) BY MOUTH EVERY EVENING 09/01/22   Lawrence, Kathryn M, NP  Multiple Vitamin (MULTIVITAMIN) tablet Take 1 tablet by mouth daily.    [provider]  [facility] VERIO test strip 2 (two) times daily. 11/02/22   [provider]  pantoprazole  (PROTONIX ) 40 MG tablet TAKE 1 TABLET(40 MG) BY MOUTH DAILY 10/19/22   Levora Reyes SAUNDERS, MD  Peppermint Oil (IBGARD) 90 MG CPCR Take as directed as needed 04/01/20   Armbruster, Elspeth SQUIBB, MD  polyethylene glycol (MIRALAX / GLYCOLAX) 17 g packet Take 34 g by mouth daily.    [provider]  rosuvastatin   (CRESTOR ) 20 MG tablet TAKE 1 TABLET(20 MG) BY MOUTH DAILY 10/20/22   Kate Lonni CROME, MD      Allergies    Rocephin [ceftriaxone], Prednisone, Ibandronic acid, Other, Tape, Tramadol hcl, Diphenhydramine  hcl, Repaglinide, and Sitagliptin    Review of Systems   Review of Systems  Respiratory:  Positive for cough.     Physical Exam Updated Vital Signs BP 120/63   Pulse 97   Temp 98.3 F (36.8 C) (Oral)   Resp 20   SpO2 97%  Physical Exam Vitals and nursing note reviewed.  Constitutional:      General: [name].    Appearance: [name]. She is not diaphoretic.  HENT:     Head: Normocephalic and atraumatic.     Comments: Swollen turbinates, posterior nasal drip, left frontal sinuses exquisitely tender to percussion, tm normal bilaterally.   Eyes:     Pupils: Pupils are equal, round, and reactive to light.  Cardiovascular:     Rate and Rhythm: Normal rate and regular rhythm.     Heart sounds: No murmur heard.    No friction rub. No gallop.  Pulmonary:     Effort: Pulmonary effort is normal.     Breath sounds: No wheezing or rales.  Abdominal:     General: There is no distension.     Palpations: Abdomen is soft.     Tenderness: There is no abdominal tenderness.  Musculoskeletal:        General: No tenderness.     Cervical back: Normal range of motion and neck supple.  Skin:    General: Skin is warm and dry.  Neurological:     Mental Status: She is alert and oriented to person, place, and time.  Psychiatric:        Behavior: Behavior normal.     ED Results / Procedures / Treatments   Labs (all labs ordered are listed, but only abnormal results are displayed) Labs Reviewed  RESP PANEL BY RT-PCR (RSV, FLU A&B, COVID)  [id number]    EKG EKG Interpretation Date/Time:  Tuesday January 26 2023 09:00:04 EST Ventricular Rate:  95 PR Interval:  156 QRS Duration:  97 QT Interval:  328 QTC Calculation: 413 R Axis:   -9  Text  Interpretation: Sinus rhythm Right atrial enlargement No significant change since last tracing Confirmed by Emil Share [phone]) on 01/26/2023 9:14:18 AM  Radiology No results found.  Procedures Procedures    Medications Ordered in ED Medications  amoxicillin -clavulanate (AUGMENTIN ) 875-125 MG per tablet 1 tablet (has no administration in time range)    ED Course/ Medical Decision Making/ A&P  Medical Decision Making Risk Prescription drug management.   78 yo F with a chief complaints of cough and congestion and bodyaches sore throat going on for about 4 days now.  Sounds like a viral syndrome, patient does clinically have some signs of sinusitis.  Will start on oral antibiotics.  Have her follow-up with her family doctor in the office.  On my record review, the patient has a anaphylactic reaction to Rocephin.  She has tolerated multiple doses of Augmentin  in the past.  9:14 AM:  I have discussed the diagnosis/risks/treatment options with the patient and family.  Evaluation and diagnostic testing in the emergency department does not suggest an emergent condition requiring admission or immediate intervention beyond what has been performed at this time.  They will follow up with PCP. We also discussed returning to the ED immediately if new or worsening sx occur. We discussed the sx which are most concerning (e.g., sudden worsening pain, fever, inability to tolerate by mouth) that necessitate immediate return. Medications administered to the patient during their visit and any new prescriptions provided to the patient are listed below.  Medications given during this visit Medications  amoxicillin -clavulanate (AUGMENTIN ) 875-125 MG per tablet 1 tablet (has no administration in time range)     The patient appears reasonably screen and/or stabilized for discharge and I doubt any other medical condition or other [facility] requiring further screening, evaluation, or  treatment in the ED at this time prior to discharge.            Final Clinical Impression(s) / ED Diagnoses Final diagnoses:  Viral URI with cough  Acute frontal sinusitis, recurrence not specified    Rx / DC Orders ED Discharge Orders          Ordered    amoxicillin -clavulanate (AUGMENTIN ) 875-125 MG tablet  Every 12 hours        01/26/23 0911    benzonatate  (TESSALON ) 100 MG capsule  Every 8 hours        01/26/23 0911    ondansetron  (ZOFRAN -ODT) 4 MG disintegrating tablet        01/26/23 0911              Emil Share, DO 01/26/23 [id number]

## 2023-01-26 NOTE — ED Triage Notes (Signed)
 Pt caox4 ambulatory c/o productive cough, SOB, sore throat, diarrhea, and fever x3 days. Pt states she took a home COVID test that was neg.

## 2023-01-26 NOTE — Telephone Encounter (Signed)
 Vm not set up, will call patient back

## 2023-01-26 NOTE — Discharge Instructions (Signed)
 Take tylenol 2 pills 4 times a day and motrin 4 pills 3 times a day.  Drink plenty of fluids.  Return for worsening shortness of breath, headache, confusion. Follow up with your family doctor.

## 2023-01-27 NOTE — Telephone Encounter (Signed)
 Left message for patient to return call for sleep study results and recommendations.

## 2023-02-01 ENCOUNTER — Inpatient Hospital Stay: Payer: Medicare Other | Admitting: Family Medicine

## 2023-02-02 ENCOUNTER — Encounter: Payer: Self-pay | Admitting: Family Medicine

## 2023-02-03 ENCOUNTER — Encounter: Payer: Self-pay | Admitting: Family Medicine

## 2023-02-03 ENCOUNTER — Telehealth (INDEPENDENT_AMBULATORY_CARE_PROVIDER_SITE_OTHER): Payer: Medicare Other | Admitting: Family Medicine

## 2023-02-03 VITALS — Ht 64.0 in

## 2023-02-03 DIAGNOSIS — U071 COVID-19: Secondary | ICD-10-CM | POA: Diagnosis not present

## 2023-02-03 DIAGNOSIS — R051 Acute cough: Secondary | ICD-10-CM

## 2023-02-03 MED ORDER — HYDROCODONE BIT-HOMATROP MBR 5-1.5 MG/5ML PO SOLN: 2.5000 mL | Freq: Every evening | ORAL | 0 refills | Status: DC | PRN

## 2023-02-03 NOTE — Progress Notes (Signed)
 Virtual Visit via Video Note I connected with [name] [name] on 02/03/2023 by a video enabled telemedicine application and verified that I am speaking with the correct person using two identifiers. Location patient: home Location provider:work office Persons participating in the virtual visit: patient,scribe, provider  I discussed the limitations of evaluation and management by telemedicine and the availability of in person appointments. The patient expressed understanding and agreed to proceed.  Chief Complaint  Patient presents with   Sore Throat        Cough   HPI: Ms. [name] [name] is a 78 y.o. female with a PMHx significant for aortic atherosclerosis, OSA, GERD, DM II, hypothyroidism, HLD, trigeminal neuralgia, CKD, and anxiety, among some, who is being seen on video today for sore throat and cough.   Patient reports that she was seen in the ER on 01/26/2023 with productive cough and sore throat that started it the day before, states that she tested positive for Covid 19 infection, she adds that antiviral medication was not prescribed.    At the time of the ER visit, she also had wheezing, SOB, nausea, and a fever of 100.6 F, which have since resolved. Denies chest pain or hemoptysis.  Currently, she is still having congestion, productive cough, and sore throat, and says they are relatively unchanged since onset. Her symptoms are keeping her up at night.  She has been taking Augmentin  and benzonatate  prescribed in the ER, as well as tylenol  for her sore throat.   She performed a home COVID 19 test and still positive. She mentions her husband also tested positive for Covid and was given Paxlovid by his PCP.  DM II: She also mentions her blood sugar has been fluctuating during this time of acute illness, but has started to stabilize since this morning.   ROS: See pertinent positives and negatives per HPI.  Past Medical History:  Diagnosis Date   Abdominal pain    EGD and CTs neg,  rx Amitriptiline (non ulcer dyspepsia) EGD 12/11.. s/p ballon dilitation   Anxiety    Cataract    Cervical spondylosis without myelopathy 11/30/2012   Chronic conjunctivitis    Diabetes mellitus    no meds, diet controlled   Diverticulosis of colon    GERD with stricture    s/p dilatations   History of Lyme disease    HOH (hard of hearing)    bilateral hearing aids   Hypercholesterolemia    Hyperlipidemia    Hypothyroidism     Dx 10-9 TSH 5.2 , then TSH normalized, eventually went up again 2012   Melanoma [facility])    h/o   Memory difficulties 06/05/2014   Osteopenia    Osteoporosis    Solar lentigo    face; s/p excision 3/09. See Derm q 6 months   Trigeminal neuralgia    h/o     Past Surgical History:  Procedure Laterality Date   ABDOMINAL HYSTERECTOMY     ANKLE SURGERY  07/01/2015   R andkle tendon surgery   BREAST BIOPSY Left 1988   BREAST EXCISIONAL BIOPSY Left 1988   CATARACT EXTRACTION Bilateral    CERVICAL FUSION  06/2017   CHOLECYSTECTOMY     COLON RESECTION  1999   d/t diverticulitis   COLON SURGERY     COLONOSCOPY     ESOPHAGEAL MANOMETRY N/A 03/17/2019   Procedure: ESOPHAGEAL MANOMETRY (EM)  NO PH;  Surgeon: Annis Kinder, DO;  Location: [facility] ENDOSCOPY;  Service: Gastroenterology;  Laterality: N/A;  LUMBAR DISC SURGERY  07/25/18, 09/20   melanoma resection Right     Right Arm Surgery, R facial surgery (05-2012)      PTX s/t chest tube     SHOULDER SURGERY     right   UPPER GASTROINTESTINAL ENDOSCOPY     WRIST SURGERY     removed melaoma, right    Family History  Problem Relation Age of Onset   Prostate cancer Father    Endometrial cancer Sister        clear cell carcinoma   Stroke Sister    Heart attack Paternal Grandfather        GF in his 30s   Melanoma Maternal Uncle    Colon cancer Neg Hx    Breast cancer Neg Hx    Diabetes Neg Hx    Stomach cancer Neg Hx    Rectal cancer Neg Hx    Esophageal cancer Neg Hx     Social History    Socioeconomic History   Marital status: Married    Spouse name: Gaylin Ke   Number of children: 2   Years of education: college   Highest education level: 12th grade  Occupational History   Occupation: retired    Associate Professor: RETIRED  Tobacco Use   Smoking status: Former    Current packs/day: 0.00    Types: Cigarettes    Start date: 01/20/1976    Quit date: 1981    Years since quitting: 44.0   Smokeless tobacco: Never  Vaping Use   Vaping status: Never Used  Substance and Sexual Activity   Alcohol use: Not Currently   Drug use: No   Sexual activity: Not Currently  Other Topics Concern   Not on file  Social History Narrative   Patient is married with 2 children.   Patient is right handed.   Patient has college education.   Patient does not drink caffeine.          Social Drivers of Corporate investment banker Strain: Low Risk  (01/15/2023)   Overall Financial Resource Strain (CARDIA)    Difficulty of Paying Living Expenses: Not hard at all  Food Insecurity: No Food Insecurity (01/15/2023)   Hunger Vital Sign    Worried About Running Out of Food in the Last Year: Never true    Ran Out of Food in the Last Year: Never true  Transportation Needs: No Transportation Needs (01/15/2023)   PRAPARE - Administrator, Civil Service (Medical): No    Lack of Transportation (Non-Medical): No  Physical Activity: Sufficiently Active (01/15/2023)   Exercise Vital Sign    Days of Exercise per Week: 4 days    Minutes of Exercise per Session: 60 min  Stress: No Stress Concern Present (01/15/2023)   [employer] of Occupational Health - Occupational Stress Questionnaire    Feeling of Stress : Only a little  Social Connections: Socially Integrated (01/15/2023)   Social Connection and Isolation Panel [NHANES]    Frequency of Communication with Friends and Family: Three times a week    Frequency of Social Gatherings with Friends and Family: Three times a week    Attends  Religious Services: More than 4 times per year    Active Member of Clubs or Organizations: Yes    Attends Banker Meetings: More than 4 times per year    Marital Status: Married  Catering manager Violence: Not At Risk (09/18/2019)   Humiliation, Afraid, Rape, and Kick questionnaire  Fear of Current or Ex-Partner: No    Emotionally Abused: No    Physically Abused: No    Sexually Abused: No     Current Outpatient Medications:    ALPRAZolam  (XANAX ) 0.5 MG tablet, TAKE 1 TABLET(0.5 MG) BY MOUTH AT BEDTIME AS NEEDED FOR SLEEP, Disp: 30 tablet, Rfl: 3   amitriptyline  (ELAVIL ) 25 MG tablet, Take 1 tablet (25 mg total) by mouth at bedtime., Disp: 90 tablet, Rfl: 3   amoxicillin -clavulanate (AUGMENTIN ) 875-125 MG tablet, Take 1 tablet by mouth every 12 (twelve) hours., Disp: 14 tablet, Rfl: 0   aspirin  81 MG EC tablet, Take 1 tablet (81 mg total) by mouth daily. Swallow whole., Disp: 30 tablet, Rfl: 12   AZASITE  1 % ophthalmic solution, Apply 1 drop to eye as needed (dry eyes)., Disp: , Rfl:    baclofen (LIORESAL) 10 MG tablet, Take 10 mg by mouth 2 (two) times daily. May take TID is needed, Disp: , Rfl:    BAQSIMI ONE PACK 3 MG/DOSE POWD, Place into both nostrils as needed (low blood sugar)., Disp: , Rfl:    benzonatate  (TESSALON ) 100 MG capsule, Take 1 capsule (100 mg total) by mouth every 8 (eight) hours., Disp: 21 capsule, Rfl: 0   Cholecalciferol (VITAMIN D3) 2000 UNITS TABS, Take 1 tablet by mouth daily., Disp: , Rfl:    Continuous Blood Gluc Receiver (DEXCOM G7 RECEIVER) DEVI, by Does not apply route., Disp: , Rfl:    diclofenac Sodium (VOLTAREN) 1 % GEL, Apply 2 g topically as needed., Disp: , Rfl:    Fluticasone -Umeclidin-Vilant (TRELEGY ELLIPTA ) 100-62.5-25 MCG/ACT AEPB, Inhale 1 puff into the lungs daily., Disp: , Rfl:    gabapentin  (NEURONTIN ) 300 MG capsule, TAKE TWO CAPSULES IN THE MORNING AND 2 CAPSULES AT BEDTIME, Disp: 360 capsule, Rfl: 3   HYDROcodone  bit-homatropine  (HYCODAN) 5-1.5 MG/5ML syrup, Take 2.5-5 mLs by mouth at bedtime as needed for cough., Disp: 50 mL, Rfl: 0   hyoscyamine  (LEVSIN SL) 0.125 MG SL tablet, Take 0.125 mg by mouth as needed., Disp: 368 tablet, Rfl: 1   Insulin  Disposable Pump (OMNIPOD DASH INTRO, GEN 4,) KIT, by Does not apply route. Change q 3 days, Disp: , Rfl:    insulin  lispro (HUMALOG) 100 UNIT/ML injection, Inject into the skin., Disp: , Rfl:    Lancets (ONETOUCH DELICA PLUS LANCET33G) MISC, Apply topically 2 (two) times daily., Disp: , Rfl:    levothyroxine (SYNTHROID, LEVOTHROID) 50 MCG tablet, Take 50 mcg by mouth daily before breakfast. Take 50mcg M-F and 100mcg on Sat and Sun, Disp: , Rfl: 12   Lifitegrast 5 % SOLN, Apply 1 each to eye as needed., Disp: , Rfl:    Melatonin 5 MG TABS, Take 1 tablet by mouth at bedtime., Disp: , Rfl:    metoprolol  succinate (TOPROL -XL) 25 MG 24 hr tablet, TAKE 1 TABLET(25 MG) BY MOUTH EVERY EVENING, Disp: 30 tablet, Rfl: 11   Multiple Vitamin (MULTIVITAMIN) tablet, Take 1 tablet by mouth daily., Disp: , Rfl:    ondansetron  (ZOFRAN -ODT) 4 MG disintegrating tablet, 4mg  ODT q4 hours prn nausea/vomit, Disp: 20 tablet, Rfl: 0   ONETOUCH VERIO test strip, 2 (two) times daily., Disp: , Rfl:    pantoprazole  (PROTONIX ) 40 MG tablet, TAKE 1 TABLET(40 MG) BY MOUTH DAILY, Disp: 90 tablet, Rfl: 1   Peppermint Oil (IBGARD) 90 MG CPCR, Take as directed as needed, Disp: , Rfl:    polyethylene glycol (MIRALAX / GLYCOLAX) 17 g packet, Take 34 g by mouth daily.,  Disp: , Rfl:    rosuvastatin  (CRESTOR ) 20 MG tablet, TAKE 1 TABLET(20 MG) BY MOUTH DAILY, Disp: 90 tablet, Rfl: 2  EXAM:  VITALS per patient if applicable:Ht 5\' 4"  (1.626 m)   BMI 25.99 kg/m   GENERAL: alert, oriented, appears well and in no acute distress  HEENT: atraumatic, conjunctiva clear, no obvious abnormalities on inspection of external nose and ears  NECK: normal movements of the head and neck  LUNGS: on inspection no signs of  respiratory distress, breathing rate appears normal, no obvious gross SOB, gasping or wheezing  CV: no obvious cyanosis  MS: moves all visible extremities without noticeable abnormality  PSYCH/NEURO: pleasant and cooperative, no obvious depression, + anxious, speech and thought processing grossly intact  ASSESSMENT AND PLAN:  Discussed the following assessment and plan:  COVID-19 virus infection In general she is feeling better, fever has resolved. She is concerned about persistent positive COVID 19 test. We discussed Dx, management,and possible complications.  Explained that some symptoms like fatigue,cough,and congestion can last a few more days and even weeks. She is not longer having symptoms that suggest complications like SOB and wheezing, so for now continue symptomatic treatment at home. Throat lozenges as needed for sore throat and Tylenol  500 mg 3-4 times per day prn. Recommend not to repeat COVID test. Monitor for fever recurrence. Clearly instructed about warning signs.  Acute cough Benzonatate  is not helping. I do not think imaging is needed at this time. Cough is affecting her sleep, so she would like something she can take at bedtime. Hycodan side effects discussed as well as the risk of med interaction. Recommend taking Hycodan at bedtime 2.5-5 ml at bedtime as needed.  -     HYDROcodone  Bit-Homatrop MBr; Take 2.5-5 mLs by mouth at bedtime as needed for cough.  Dispense: 50 mL; Refill: 0  We discussed possible serious and likely etiologies, options for evaluation and workup, limitations of telemedicine visit vs in person visit, treatment, treatment risks and precautions. The patient was advised to call back or seek an in-person evaluation if the symptoms worsen or if the condition fails to improve as anticipated. I discussed the assessment and treatment plan with the patient. The patient was provided an opportunity to ask questions and all were answered. The patient  agreed with the plan and demonstrated an understanding of the instructions.  Return if symptoms worsen or fail to improve.  I, Fritz Jewel Wierda, acting as a scribe for Lindzey Zent [place], MD., have documented all relevant documentation on the behalf of Delpha Perko [place], MD, as directed by  Daniella Dewberry [place], MD while in the presence of Azekiel Cremer [place], MD.   I, Brodee Mauritz [place], MD, have reviewed all documentation for this visit. The documentation on 02/03/23 for the exam, diagnosis, procedures, and orders are all accurate and complete.  Cerise Lieber [place], MD

## 2023-02-07 NOTE — Progress Notes (Unsigned)
Cardiology Office Note:    Date:  02/11/2023   ID:  [name] [name], DOB [birth year], MRN [id number]  PCP:  Sheliah Hatch, MD  Cardiologist:  Little Ishikawa, MD  Electrophysiologist:  None   Referring MD: Sheliah Hatch, MD   Chief Complaint  Patient presents with   Palpitations    History of Present Illness:    [name] [name] is a 78 y.o. female with a hx of T2DM, hyperlipidemia, hypothyroidism, melanoma who presents for follow-up.  She was referred by Dr. Beverely Low for evaluation of aortic atherosclerosis, initially seen on 08/29/2018.  Noted on CT abdomen pelvis 06/03/2020.  She reports that she does water aerobics 3 days/week, reports she has been developing shortness of breath and lightheadedness with exertion.  She denies any chest pain during these episodes.  However has been having episodes at night where she feels like her chest and arms are throbbing, lasts for 5 to 30 minutes.  Occurs most nights.  She denies any syncope or lower extremity edema.  Denies any palpitations.  She quit smoking at age 51.  No known history of heart disease in her immediate family.  Echocardiogram on 09/30/2020 showed normal biventricular function, grade 1 diastolic dysfunction, no significant valvular disease.  Lexiscan Myoview on 09/30/2020 showed normal perfusion, EF 69%.  Calcium score on 09/30/2020 was 213 (75th percentile).  Zio patch x 14 days 02/2022 showed 52 episodes of SVT, longest lasting 9 seconds with average rate 155 bpm.  Stress PET 06/17/2022 showed normal perfusion, normal myocardial blood flow reserve, EF 68%, moderate coronary calcifications.  Since last clinic visit, she reports she is doing okay.  She continues to have palpitations but is improved on metoprolol.  Denies any chest pain.  She does have shortness of breath.  Does report some lightheadedness but denies any syncope.  Past Medical History:  Diagnosis Date   Abdominal pain    EGD and CTs neg, rx Amitriptiline  (non ulcer dyspepsia) EGD 12/11.. s/p ballon dilitation   Anxiety    Cataract    Cervical spondylosis without myelopathy 11/30/2012   Chronic conjunctivitis    Diabetes mellitus    no meds, diet controlled   Diverticulosis of colon    GERD with stricture    s/p dilatations   History of Lyme disease    HOH (hard of hearing)    bilateral hearing aids   Hypercholesterolemia    Hyperlipidemia    Hypothyroidism     Dx 10-9 TSH 5.2 , then TSH normalized, eventually went up again 2012   Melanoma [facility])    h/o   Memory difficulties 06/05/2014   Osteopenia    Osteoporosis    Solar lentigo    face; s/p excision 3/09. See Derm q 6 months   Trigeminal neuralgia    h/o     Past Surgical History:  Procedure Laterality Date   ABDOMINAL HYSTERECTOMY     ANKLE SURGERY  07/01/2015   R andkle tendon surgery   BREAST BIOPSY Left 1988   BREAST EXCISIONAL BIOPSY Left 1988   CATARACT EXTRACTION Bilateral    CERVICAL FUSION  06/2017   CHOLECYSTECTOMY     COLON RESECTION  1999   d/t diverticulitis   COLON SURGERY     COLONOSCOPY     ESOPHAGEAL MANOMETRY N/A 03/17/2019   Procedure: ESOPHAGEAL MANOMETRY (EM)  NO PH;  Surgeon: Shellia Cleverly, DO;  Location: [facility];  Service: Gastroenterology;  Laterality: N/A;   LUMBAR DISC SURGERY  07/25/18, 09/20   melanoma resection Right     Right Arm Surgery, R facial surgery (05-2012)      PTX s/t chest tube     SHOULDER SURGERY     right   UPPER GASTROINTESTINAL ENDOSCOPY     WRIST SURGERY     removed melaoma, right    Current Medications: Current Meds  Medication Sig   ALPRAZolam (XANAX) 0.5 MG tablet TAKE 1 TABLET(0.5 MG) BY MOUTH AT BEDTIME AS NEEDED FOR SLEEP   amitriptyline (ELAVIL) 25 MG tablet Take 1 tablet (25 mg total) by mouth at bedtime.   aspirin 81 MG EC tablet Take 1 tablet (81 mg total) by mouth daily. Swallow whole.   AZASITE 1 % ophthalmic solution Apply 1 drop to eye as needed (dry eyes).   baclofen (LIORESAL) 10 MG  tablet Take 10 mg by mouth 2 (two) times daily. May take TID is needed   BAQSIMI ONE PACK 3 MG/DOSE POWD Place into both nostrils as needed (low blood sugar).   Cholecalciferol (VITAMIN D3) 2000 UNITS TABS Take 1 tablet by mouth daily.   Continuous Blood Gluc Receiver (DEXCOM G7 RECEIVER) DEVI by Does not apply route.   diclofenac Sodium (VOLTAREN) 1 % GEL Apply 2 g topically as needed.   gabapentin (NEURONTIN) 300 MG capsule TAKE TWO CAPSULES IN THE MORNING AND 2 CAPSULES AT BEDTIME   hyoscyamine (LEVSIN SL) 0.125 MG SL tablet Take 0.125 mg by mouth as needed.   Insulin Disposable Pump (OMNIPOD DASH INTRO, GEN 4,) KIT by Does not apply route. Change q 3 days   insulin lispro (HUMALOG) 100 UNIT/ML injection Inject into the skin.   Lancets (ONETOUCH DELICA PLUS LANCET33G) MISC Apply topically 2 (two) times daily.   levothyroxine (SYNTHROID, LEVOTHROID) 50 MCG tablet Take 50 mcg by mouth daily before breakfast. Take M-F and on Sat and Sun   Lifitegrast 5 % SOLN Apply 1 each to eye as needed.   Melatonin 5 MG TABS Take 1 tablet by mouth at bedtime.   metoprolol succinate (TOPROL-XL) 25 MG 24 hr tablet TAKE 1 TABLET(25 MG) BY MOUTH EVERY EVENING   Multiple Vitamin (MULTIVITAMIN) tablet Take 1 tablet by mouth daily.   ONETOUCH VERIO test strip 2 (two) times daily.   pantoprazole (PROTONIX) 40 MG tablet TAKE 1 TABLET(40 MG) BY MOUTH DAILY   Peppermint Oil (IBGARD) 90 MG CPCR Take as directed as needed   polyethylene glycol (MIRALAX / GLYCOLAX) 17 g packet Take 34 g by mouth daily.   rosuvastatin (CRESTOR) 20 MG tablet TAKE 1 TABLET(20 MG) BY MOUTH DAILY     Allergies:   Rocephin [ceftriaxone], Prednisone, Ibandronic acid, Other, Tape, Tramadol hcl, Diphenhydramine hcl, Repaglinide, and Sitagliptin   Social History   Socioeconomic History   Marital status: Married    Spouse name: Ramon Dredge   Number of children: 2   Years of education: college   Highest education level: 12th grade   Occupational History   Occupation: retired    Associate Professor: RETIRED  Tobacco Use   Smoking status: Former    Current packs/day: 0.00    Types: Cigarettes    Start date: 01/20/1976    Quit date: 1981    Years since quitting: 44.0   Smokeless tobacco: Never  Vaping Use   Vaping status: Never Used  Substance and Sexual Activity   Alcohol use: Not Currently   Drug use: No   Sexual activity: Not Currently  Other Topics Concern   Not on  file  Social History Narrative   Patient is married with 2 children.   Patient is right handed.   Patient has college education.   Patient does not drink caffeine.          Social Drivers of Corporate investment banker Strain: Low Risk  (01/15/2023)   Overall Financial Resource Strain (CARDIA)    Difficulty of Paying Living Expenses: Not hard at all  Food Insecurity: No Food Insecurity (01/15/2023)   Hunger Vital Sign    Worried About Running Out of Food in the Last Year: Never true    Ran Out of Food in the Last Year: Never true  Transportation Needs: No Transportation Needs (01/15/2023)   PRAPARE - Administrator, Civil Service (Medical): No    Lack of Transportation (Non-Medical): No  Physical Activity: Sufficiently Active (01/15/2023)   Exercise Vital Sign    Days of Exercise per Week: 4 days    Minutes of Exercise per Session: 60 min  Stress: No Stress Concern Present (01/15/2023)   [employer] of Occupational Health - Occupational Stress Questionnaire    Feeling of Stress : Only a little  Social Connections: Socially Integrated (01/15/2023)   Social Connection and Isolation Panel [NHANES]    Frequency of Communication with Friends and Family: Three times a week    Frequency of Social Gatherings with Friends and Family: Three times a week    Attends Religious Services: More than 4 times per year    Active Member of Clubs or Organizations: Yes    Attends Engineer, structural: More than 4 times per year     Marital Status: Married     Family History: The patient'sfamily history includes Endometrial cancer in her sister; Heart attack in her paternal grandfather; Melanoma in her maternal uncle; Prostate cancer in her father; Stroke in her sister. There is no history of Colon cancer, Breast cancer, Diabetes, Stomach cancer, Rectal cancer, or Esophageal cancer.  ROS:   Please see the history of present illness.     All other systems reviewed and are negative.  EKGs/Labs/Other Studies Reviewed:    The following studies were reviewed today:   EKG:   02/27/21: Normal sinus rhythm, rate 84, biatrial enlargement 08/12/22: Normal sinus rhythm, rate 73, right atrial enlargement   Recent Labs: 08/12/2022: BNP 31.3 01/18/2023: ALT 16; BUN 23; Creatinine, Ser 0.93; Hemoglobin 13.3; Platelets 224.0; Potassium 4.4; Sodium 140; TSH 3.02  Recent Lipid Panel    Component Value Date/Time   CHOL 189 01/18/2023 0927   CHOL 170 02/28/2021 0916   TRIG 67.0 01/18/2023 0927   TRIG 37 11/11/2005 0831   HDL 87.50 01/18/2023 0927   HDL 84 02/28/2021 0916   CHOLHDL 2 01/18/2023 0927   VLDL 13.4 01/18/2023 0927   LDLCALC 88 01/18/2023 0927   LDLCALC 76 02/28/2021 0916   LDLCALC 82 06/21/2020 1508   LDLDIRECT 148.1 06/11/2006 1458    Physical Exam:    VS:  BP 122/82   Pulse 85   Ht 5\' 4"  (1.626 m)   Wt 146 lb 9.6 oz (66.5 kg)   SpO2 98%   BMI 25.16 kg/m     Wt Readings from Last 3 Encounters:  02/09/23 146 lb 9.6 oz (66.5 kg)  01/18/23 151 lb 6.4 oz (68.7 kg)  01/05/23 151 lb 12.8 oz (68.9 kg)     GEN:  Well nourished, well developed in no acute distress HEENT: Normal NECK: No JVD; No carotid bruits LYMPHATICS:  No lymphadenopathy CARDIAC: RRR, no murmurs, rubs, gallops RESPIRATORY:  Clear to auscultation without rales, wheezing or rhonchi  ABDOMEN: Soft, non-tender, non-distended MUSCULOSKELETAL:  No edema; No deformity  SKIN: Warm and dry NEUROLOGIC:  Alert and oriented x 3 PSYCHIATRIC:   Normal affect   ASSESSMENT:    1. DOE (dyspnea on exertion)   2. Palpitations   3. Hyperlipidemia, unspecified hyperlipidemia type        PLAN:    Chest pain/DOE: Chest pain is atypical in description, but also having dyspnea/lightheadedness with exertion.  Echocardiogram on 09/30/2020 showed normal biventricular function, grade 1 diastolic dysfunction, no significant valvular disease.  Lexiscan Myoview on 09/30/2020 showed normal perfusion, EF 69%.  Stress PET 06/17/2022 showed normal perfusion, normal myocardial blood flow reserve, EF 68%, moderate coronary calcifications. -Normal BNP 07/2022.  Suspect shortness of breath is noncardiac in etiology  Palpitations: Zio patch x 14 days 02/2022 showed 52 episodes of SVT, longest lasting 9 seconds with average rate 155 bpm.  Started on Toprol-XL 25 mg daily.  Reports improvement  Hyperlipidemia: On rosuvastatin 10 mg daily.  LDL 82 on 07/18/2020.  Calcium score on 09/30/2020 was 213 (75th percentile), rosuvastatin dose increased to 20 mg.  LDL 74 on 07/14/2022  T2DM: on insulin.  Most recent A1c 7.7% on 01/18/2019  OSA: Positive sleep study 12/2022, planning to start CPAP  RTC in 1 year    Medication Adjustments/Labs and Tests Ordered: Current medicines are reviewed at length with the patient today.  Concerns regarding medicines are outlined above.  No orders of the defined types were placed in this encounter.   No orders of the defined types were placed in this encounter.    Patient Instructions  Medication Instructions:  Continue all current medications *If you need a refill on your cardiac medications before your next appointment, please call your pharmacy*   Lab Work: none If you have labs (blood work) drawn today and your tests are completely normal, you will receive your results only by: MyChart Message (if you have MyChart) OR A paper copy in the mail If you have any lab test that is abnormal or we need to change your  treatment, we will call you to review the results.   Testing/Procedures: none   Follow-Up: At [facility], you and your health needs are our priority.  As part of our continuing mission to provide you with exceptional heart care, we have created designated Provider Care Teams.  These Care Teams include your primary Cardiologist (physician) and Advanced Practice Providers (APPs -  Physician Assistants and Nurse Practitioners) who all work together to provide you with the care you need, when you need it.  We recommend signing up for the patient portal called "MyChart".  Sign up information is provided on this After Visit Summary.  MyChart is used to connect with patients for Virtual Visits (Telemedicine).  Patients are able to view lab/test results, encounter notes, upcoming appointments, etc.  Non-urgent messages can be sent to your provider as well.   To learn more about what you can do with MyChart, go to [email or web address].    Your next appointment:   1 year(s)  Provider:   Little Ishikawa, MD     Other Instructions noe         Signed, Little Ishikawa, MD  02/11/2023 11:00 PM    [facility]

## 2023-02-09 ENCOUNTER — Ambulatory Visit: Payer: Medicare Other | Attending: Cardiology | Admitting: Cardiology

## 2023-02-09 ENCOUNTER — Encounter: Payer: Self-pay | Admitting: Cardiology

## 2023-02-09 ENCOUNTER — Encounter: Payer: Self-pay | Admitting: Internal Medicine

## 2023-02-09 VITALS — BP 122/82 | HR 85 | Ht 64.0 in | Wt 146.6 lb

## 2023-02-09 DIAGNOSIS — R002 Palpitations: Secondary | ICD-10-CM | POA: Diagnosis not present

## 2023-02-09 DIAGNOSIS — E785 Hyperlipidemia, unspecified: Secondary | ICD-10-CM | POA: Insufficient documentation

## 2023-02-09 DIAGNOSIS — R0609 Other forms of dyspnea: Secondary | ICD-10-CM | POA: Insufficient documentation

## 2023-02-09 DIAGNOSIS — G4733 Obstructive sleep apnea (adult) (pediatric): Secondary | ICD-10-CM

## 2023-02-09 NOTE — Patient Instructions (Signed)
Medication Instructions:  Continue all current medications *If you need a refill on your cardiac medications before your next appointment, please call your pharmacy*   Lab Work: none If you have labs (blood work) drawn today and your tests are completely normal, you will receive your results only by: MyChart Message (if you have MyChart) OR A paper copy in the mail If you have any lab test that is abnormal or we need to change your treatment, we will call you to review the results.   Testing/Procedures: none   Follow-Up: At [facility], you and your health needs are our priority.  As part of our continuing mission to provide you with exceptional heart care, we have created designated Provider Care Teams.  These Care Teams include your primary Cardiologist (physician) and Advanced Practice Providers (APPs -  Physician Assistants and Nurse Practitioners) who all work together to provide you with the care you need, when you need it.  We recommend signing up for the patient portal called "MyChart".  Sign up information is provided on this After Visit Summary.  MyChart is used to connect with patients for Virtual Visits (Telemedicine).  Patients are able to view lab/test results, encounter notes, upcoming appointments, etc.  Non-urgent messages can be sent to your provider as well.   To learn more about what you can do with MyChart, go to [email or web address].    Your next appointment:   1 year(s)  Provider:   Little Ishikawa, MD     Other Instructions noe

## 2023-02-15 ENCOUNTER — Other Ambulatory Visit: Payer: Self-pay | Admitting: Family Medicine

## 2023-02-15 DIAGNOSIS — F411 Generalized anxiety disorder: Secondary | ICD-10-CM

## 2023-02-15 NOTE — Telephone Encounter (Signed)
[name] [name]- your sleep study wa read by Dr Mayford Knife. I have reviewed the report. Your Home Sleep Test was done on 12/31/22 and showed moderate obstructive sleep apnea, averaging 18.4 apnea events/ hour with loud snoring. I recommend we order new DME, new CPAP auto 5-20, mask of choice, humidifier, supplies, AirView/ card. I think this would be the best therapy for you to start with. We can, of course discuss it first if you have questions. I will ask my staff to schedule you for a return office visit here in 2 months.

## 2023-02-15 NOTE — Telephone Encounter (Signed)
Requested Prescriptions   Pending Prescriptions Disp Refills   ALPRAZolam (XANAX) 0.5 MG tablet [Pharmacy Med Name: ALPRAZOLAM 0.5MG  TABLETS] 30 tablet     Sig: TAKE 1 TABLET(0.5 MG) BY MOUTH AT BEDTIME AS NEEDED FOR SLEEP     Date of patient request: 02/15/2023 Last office visit: 01/18/2023 Upcoming visit: 07/19/2023 Date of last refill: 10/19/2022 Last refill amount: 30

## 2023-02-16 ENCOUNTER — Telehealth: Payer: Self-pay | Admitting: Internal Medicine

## 2023-02-16 NOTE — Telephone Encounter (Signed)
Zott, Penni Bombard, [address]; Zott, Stacy; [place], Tammy [place], we are going to need an addendum to the 7/24 office notes to order the sleep test, it looks like this was done thru a phone call, insurance will not approve without and office visit ordering testing. Please advise thank you

## 2023-02-17 NOTE — Telephone Encounter (Signed)
Not clear what Zott is requiring, with reference to 7/24. Dr Bjorn Pippin of cardiology saw her on that date. He was the one who ordered the sleep study. I first saw here 01/05/23.

## 2023-03-01 ENCOUNTER — Other Ambulatory Visit: Payer: Self-pay | Admitting: Family Medicine

## 2023-03-01 DIAGNOSIS — H0102B Squamous blepharitis left eye, upper and lower eyelids: Secondary | ICD-10-CM | POA: Diagnosis not present

## 2023-03-01 DIAGNOSIS — H0102A Squamous blepharitis right eye, upper and lower eyelids: Secondary | ICD-10-CM | POA: Diagnosis not present

## 2023-03-01 DIAGNOSIS — H0288B Meibomian gland dysfunction left eye, upper and lower eyelids: Secondary | ICD-10-CM | POA: Diagnosis not present

## 2023-03-01 DIAGNOSIS — H0288A Meibomian gland dysfunction right eye, upper and lower eyelids: Secondary | ICD-10-CM | POA: Diagnosis not present

## 2023-03-01 DIAGNOSIS — Z1231 Encounter for screening mammogram for malignant neoplasm of breast: Secondary | ICD-10-CM

## 2023-03-03 ENCOUNTER — Telehealth: Payer: Self-pay | Admitting: *Deleted

## 2023-03-03 NOTE — Telephone Encounter (Signed)
Called and made patient aware that paper work requested has faxed to [facility] . Paper work faxed to [phone]. Understanding verbalized

## 2023-03-04 DIAGNOSIS — E039 Hypothyroidism, unspecified: Secondary | ICD-10-CM | POA: Diagnosis not present

## 2023-03-04 DIAGNOSIS — Z9641 Presence of insulin pump (external) (internal): Secondary | ICD-10-CM | POA: Diagnosis not present

## 2023-03-04 DIAGNOSIS — E1065 Type 1 diabetes mellitus with hyperglycemia: Secondary | ICD-10-CM | POA: Diagnosis not present

## 2023-03-11 NOTE — Patient Instructions (Signed)
 Below is our plan:  We will continue amitriptyline 25mg  at bedtime and gabapentin twice daily. Your memory score was a little lower but it sonds like you are fairly stable. Continue memory compensation strategies. I am so happy to hear that you are getting a CPAP machine tomorrow! Follow up with Dr Maple Hudson as prescribed.   Please make sure you are staying well hydrated. I recommend 50-60 ounces daily. Well balanced diet and regular exercise encouraged. Consistent sleep schedule with 6-8 hours recommended.   Please continue follow up with care team as directed.   Follow up with me in 1 year   You may receive a survey regarding today's visit. I encourage you to leave honest feed back as I do use this information to improve patient care. Thank you for seeing me today!   Management of Memory Problems   There are some general things you can do to help manage your memory problems.  Your memory may not in fact recover, but by using techniques and strategies you will be able to manage your memory difficulties better.   1)  Establish a routine. Try to establish and then stick to a regular routine.  By doing this, you will get used to what to expect and you will reduce the need to rely on your memory.  Also, try to do things at the same time of day, such as taking your medication or checking your calendar first thing in the morning. Think about think that you can do as a part of a regular routine and make a list.  Then enter them into a daily planner to remind you.  This will help you establish a routine.   2)  Organize your environment. Organize your environment so that it is uncluttered.  Decrease visual stimulation.  Place everyday items such as keys or cell phone in the same place every day (ie.  Basket next to front door) Use post it notes with a brief message to yourself (ie. Turn off light, lock the door) Use labels to indicate where things go (ie. Which cupboards are for food, dishes, etc.) Keep  a notepad and pen by the telephone to take messages   3)  Memory Aids A diary or journal/notebook/daily planner Making a list (shopping list, chore list, to do list that needs to be done) Using an alarm as a reminder (kitchen timer or cell phone alarm) Using cell phone to store information (Notes, Calendar, Reminders) Calendar/White board placed in a prominent position Post-it notes   In order for memory aids to be useful, you need to have good habits.  It's no good remembering to make a note in your journal if you don't remember to look in it.  Try setting aside a certain time of day to look in journal.   4)  Improving mood and managing fatigue. There may be other factors that contribute to memory difficulties.  Factors, such as anxiety, depression and tiredness can affect memory. Regular gentle exercise can help improve your mood and give you more energy. Exercise: there are short videos created by the [employer] on Health specially for older adults: https://bit.ly/2I30q97.  Mediterranean diet: which emphasizes fruits, vegetables, whole grains, legumes, fish, and other seafood; unsaturated fats such as olive oils; and low amounts of red meat, eggs, and sweets. A variation of this, called MIND (Mediterranean-DASH Intervention for Neurodegenerative Delay) incorporates the DASH (Dietary Approaches to Stop Hypertension) diet, which has been shown to lower high blood pressure, a risk factor  for Alzheimer's disease. More information at: ExitMarketing.de.  Aerobic exercise that improve heart health is also good for the mind.  [employer] on Aging have short videos for exercises that you can do at home: BlindWorkshop.com.pt Simple relaxation techniques may help relieve symptoms of anxiety Try to get back to completing activities or hobbies you enjoyed doing in the past. Learn to pace yourself through  activities to decrease fatigue. Find out about some local support groups where you can share experiences with others. Try and achieve 7-8 hours of sleep at night.   Tasks to improve attention/working memory 1. Good sleep hygiene (7-8 hrs of sleep) 2. Learning a new skill (Painting, Carpentry, Pottery, new language, Knitting). 3.Cognitive exercises (keep a daily journal, Puzzles) 4. Physical exercise and training  (30 min/day X 4 days week) 5. Being on Antidepressant if needed 6.Yoga, Meditation, Tai Chi 7. Decrease alcohol intake 8.Have a clear schedule and structure in daily routine   MIND Diet: The Mediterranean-DASH Diet Intervention for Neurodegenerative Delay, or MIND diet, targets the health of the aging brain. Research participants with the highest MIND diet scores had a significantly slower rate of cognitive decline compared with those with the lowest scores. The effects of the MIND diet on cognition showed greater effects than either the Mediterranean or the DASH diet alone.   The healthy items the MIND diet guidelines suggest include:   3+ servings a day of whole grains 1+ servings a day of vegetables (other than green leafy) 6+ servings a week of green leafy vegetables 5+ servings a week of nuts 4+ meals a week of beans 2+ servings a week of berries 2+ meals a week of poultry 1+ meals a week of fish Mainly olive oil if added fat is used   The unhealthy items, which are higher in saturated and trans fat, include: Less than 5 servings a week of pastries and sweets Less than 4 servings a week of red meat (including beef, pork, lamb, and products made from these meats) Less than one serving a week of cheese and fried foods Less than 1 tablespoon a day of butter/stick margarine

## 2023-03-11 NOTE — Progress Notes (Signed)
 Chief Complaint  Patient presents with   Memory Loss    Rm1, alone, Memory loss: moca 25    HISTORY OF PRESENT ILLNESS:  03/16/23 ALL: [name] [name] returns for follow up for TN, diabetic neuropathy, and memory loss. She continues amitriptyline 25mg  QHS and gabapentin 600mg  BID. She reports TN is stable. No significant pain. She did have some jaw pain after dental cleaning last year but pain settled down with Tylenol. No pain after most recent cleaning. Neuropathy pain is mild. She feels symptoms are very well managed.   MRI showed generalized atrophy. Referral to neuropsychology placed. She has not been seen and does not feel it is needed at this time. She feels memory is fairly stable. She has been under more stress with her husband who is ill and recent Covid diagnosis. 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 She is driving without difficulty.   She was seen in consult with pulmonology 12/2022 following concerns of OSA that were raised during a recent family vacation. She had HST with Dr Maple Hudson, 12/31/2022 showing moderate obstructive sleep apnea, averaging 18.4 apnea events/ hour with loud snoring. CPAP advised. She is planning to pick up her machine tomorrow. She has follow up with Dr Maple Hudson 05/04/2023.   03/17/2022 ALL:  [name] [name] returns for follow up for TN and diabetic neuropathy. She continues amitriptyline 25mg  QHS and gabapentin 600mg  BID. She reports TN is stable. No significant pain. Neuropathy pain is mild. She feels symptoms are very well managed.   She remains concerned about word finding difficulty. She feels that she has trouble processing thoughts at times. 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 She finds herself stopping during mid sentence think of what she is trying to say. She is able to get thoughts out, eventually. She went to audiologist for adjustment of hearing aids. She feels  that she is hearing sounds and interpreting better. She is sleeping well. She does take alprazolam at bedtime for insomnia. 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 She does water aerobics three times a week.   Last A1C 6.7. She does continue to have some lower readings. Reading was low in our waiting room today. Now 68. She does have a sharp stabbing pain on the top of her head when blood sugar readings are lower. She is followed by cardiology for tachycardia with questionable afib. Currently wearing a heart monitor. She continues asa and rosuvastatin.   03/13/2021 ALL: [name] [name] returns for follow up for headaches. She continues amitriptyline 25mg  and gabapentin 600mg  BID. She reports TN pain has been well managed. She did have a littler more pain following cataract surgery but feels it is well managed. She is concerned about low blood sugar readings. She has insulin pump for DMT1. She has had generalized fatigue, weakness, mental fog and headaches with low readings. She usually feels poorly if CBG drops below 80. She has had a couple of readings in the 50s. She reports readings have improved over the past year. She is followed closely by endo. Diabetic neuropathy is stable. She does continue to note some short term memory loss. She will forget what she went into a room for. She usually remembers once leaving the room. She is able to drive. She is able to perform ADLs independently. She manages her home and finances. She does have hearing aids. No family history of dementia.   11/15/2019 ALL:  [name] [name] is a 78 y.o. female here today for follow up for headaches.  She continues amitriptyline 25mg  and gabapentin 300/300/600 for management of head and neck pain. Imaging shows cervical spondylosis. She reports that both headaches, neck and back pain, as well as neuralgia pain are all well managed on this combination. She has felt more anxiety recently due to worsening diabetes. She is now on basil and meal coverage insulin.  She is working closely with PCP. She had lumbar surgery last year and has recovered well. She does feel that she has some word finding concerns from time to time. She feels overwhelmed with new insulin regimen. She is very active. Loves to read. She participates in church groups.   HISTORY (copied from Dr Anne Hahn' note on 11/15/2018)  [name] [name] is a 78 year old right-handed white female with a history of a headache syndrome that is associated with sharp jabbing pain coming up from the right side of the neck into the right frontotemporal area.  This may occur on average once or twice a week.  The patient is on amitriptyline and gabapentin, she was on propranolol but stopped this medication.  She has diabetes and is currently on insulin, she does have a peripheral neuropathy associated with this.  She recently fell in May 2020 and injured her low back which required 2 surgical procedures, one on 25 July 2018 and the second on 20 September 2018.  The patient had severe sciatica on involving the right leg with weakness of the right leg.  She is now getting over her second surgery, she is in physical therapy.  She is wearing a low back brace.  She at some point wishes to come off of the amitriptyline but she is still having a lot of back pain and muscle spasm.  She remains on gabapentin, she tolerates this fairly well.   REVIEW OF SYSTEMS: Out of a complete 14 system review of symptoms, the patient complains only of the following symptoms,memory loss, headaches, neck pain, back pain, neuropathy, anxiety and all other reviewed systems are negative.   ALLERGIES: Allergies  Allergen Reactions   Rocephin [Ceftriaxone] Anaphylaxis   Prednisone Other (See Comments)    High blood sugars (pt reports)   Ibandronate Hives   Other     Can't use adhesive tape, VERY DELICATE SKIN FROM MELANOMA    Tape    Tramadol Hcl Hives   Diphenhydramine Hcl Swelling   Repaglinide Rash   Sitagliptin Rash     HOME  MEDICATIONS: Outpatient Medications Prior to Visit  Medication Sig Dispense Refill   ALPRAZolam (XANAX) 0.5 MG tablet TAKE 1 TABLET(0.5 MG) BY MOUTH AT BEDTIME AS NEEDED FOR SLEEP 30 tablet 3   aspirin 81 MG EC tablet Take 1 tablet (81 mg total) by mouth daily. Swallow whole. 30 tablet 12   AZASITE 1 % ophthalmic solution Apply 1 drop to eye as needed (dry eyes).     baclofen (LIORESAL) 10 MG tablet Take 10 mg by mouth 2 (two) times daily. May take TID is needed     BAQSIMI ONE PACK 3 MG/DOSE POWD Place into both nostrils as needed (low blood sugar).     Cholecalciferol (VITAMIN D3) 2000 UNITS TABS Take 1 tablet by mouth daily.     Continuous Blood Gluc Receiver (DEXCOM G7 RECEIVER) DEVI by Does not apply route.     diclofenac Sodium (VOLTAREN) 1 % GEL Apply 2 g topically as needed.     hyoscyamine (LEVSIN SL) 0.125  MG SL tablet Take 0.125 mg by mouth as needed. 368 tablet 1   Insulin Disposable Pump (OMNIPOD DASH INTRO, GEN 4,) KIT by Does not apply route. Change q 3 days     insulin lispro (HUMALOG) 100 UNIT/ML injection Inject into the skin.     Lancets (ONETOUCH DELICA PLUS LANCET33G) MISC Apply topically 2 (two) times daily.     levothyroxine (SYNTHROID, LEVOTHROID) 50 MCG tablet Take 50 mcg by mouth daily before breakfast. Take M-F and on Sat and Sun  12   Lifitegrast 5 % SOLN Apply 1 each to eye as needed.     metoprolol succinate (TOPROL-XL) 25 MG 24 hr tablet TAKE 1 TABLET(25 MG) BY MOUTH EVERY EVENING 30 tablet 11   Multiple Vitamin (MULTIVITAMIN) tablet Take 1 tablet by mouth daily.     ONETOUCH VERIO test strip 2 (two) times daily.     pantoprazole (PROTONIX) 40 MG tablet TAKE 1 TABLET(40 MG) BY MOUTH DAILY 90 tablet 1   Peppermint Oil (IBGARD) 90 MG CPCR Take as directed as needed     polyethylene glycol (MIRALAX / GLYCOLAX) 17 g packet Take 34 g by mouth daily.     rosuvastatin (CRESTOR) 20 MG tablet TAKE 1 TABLET(20 MG) BY MOUTH DAILY 90 tablet 2   amitriptyline  (ELAVIL) 25 MG tablet Take 1 tablet (25 mg total) by mouth at bedtime. 90 tablet 3   gabapentin (NEURONTIN) 300 MG capsule TAKE TWO CAPSULES IN THE MORNING AND 2 CAPSULES AT BEDTIME 360 capsule 3   Melatonin 5 MG TABS Take 1 tablet by mouth at bedtime.     No facility-administered medications prior to visit.     PAST MEDICAL HISTORY: Past Medical History:  Diagnosis Date   Abdominal pain    EGD and CTs neg, rx Amitriptiline (non ulcer dyspepsia) EGD 12/11.. s/p ballon dilitation   Anxiety    Cataract    Cervical spondylosis without myelopathy 11/30/2012   Chronic conjunctivitis    Diabetes mellitus    no meds, diet controlled   Diverticulosis of colon    GERD with stricture    s/p dilatations   History of Lyme disease    HOH (hard of hearing)    bilateral hearing aids   Hypercholesterolemia    Hyperlipidemia    Hypothyroidism     Dx 10-9 TSH 5.2 , then TSH normalized, eventually went up again 2012   Melanoma [facility])    h/o   Memory difficulties 06/05/2014   Osteopenia    Osteoporosis    Solar lentigo    face; s/p excision 3/09. See Derm q 6 months   Trigeminal neuralgia    h/o      PAST SURGICAL HISTORY: Past Surgical History:  Procedure Laterality Date   ABDOMINAL HYSTERECTOMY     ANKLE SURGERY  07/01/2015   R andkle tendon surgery   BREAST BIOPSY Left 1988   BREAST EXCISIONAL BIOPSY Left 1988   CATARACT EXTRACTION Bilateral    CERVICAL FUSION  06/2017   CHOLECYSTECTOMY     COLON RESECTION  1999   d/t diverticulitis   COLON SURGERY     COLONOSCOPY     ESOPHAGEAL MANOMETRY N/A 03/17/2019   Procedure: ESOPHAGEAL MANOMETRY (EM)  NO PH;  Surgeon: Shellia Cleverly, DO;  Location: [facility];  Service: Gastroenterology;  Laterality: N/A;   LUMBAR DISC SURGERY  07/25/18, 09/20   melanoma resection Right     Right Arm Surgery, R facial surgery (05-2012)  PTX s/t chest tube     SHOULDER SURGERY     right   UPPER GASTROINTESTINAL ENDOSCOPY     WRIST SURGERY      removed melaoma, right     FAMILY HISTORY: Family History  Problem Relation Age of Onset   Prostate cancer Father    Endometrial cancer Sister        clear cell carcinoma   Stroke Sister    Heart attack Paternal Grandfather        GF in his 52s   Melanoma Maternal Uncle    Colon cancer Neg Hx    Breast cancer Neg Hx    Diabetes Neg Hx    Stomach cancer Neg Hx    Rectal cancer Neg Hx    Esophageal cancer Neg Hx      SOCIAL HISTORY: Social History   Socioeconomic History   Marital status: Married    Spouse name: Ramon Dredge   Number of children: 2   Years of education: college   Highest education level: 12th grade  Occupational History   Occupation: retired    Associate Professor: RETIRED  Tobacco Use   Smoking status: Former    Current packs/day: 0.00    Types: Cigarettes    Start date: 01/20/1976    Quit date: 1981    Years since quitting: 44.1   Smokeless tobacco: Never  Vaping Use   Vaping status: Never Used  Substance and Sexual Activity   Alcohol use: Not Currently   Drug use: No   Sexual activity: Not Currently  Other Topics Concern   Not on file  Social History Narrative   Patient is married with 2 children.   Patient is right handed.   Patient has college education.   Patient does not drink caffeine.          Social Drivers of Corporate investment banker Strain: Low Risk  (01/15/2023)   Overall Financial Resource Strain (CARDIA)    Difficulty of Paying Living Expenses: Not hard at all  Food Insecurity: No Food Insecurity (01/15/2023)   Hunger Vital Sign    Worried About Running Out of Food in the Last Year: Never true    Ran Out of Food in the Last Year: Never true  Transportation Needs: No Transportation Needs (01/15/2023)   PRAPARE - Administrator, Civil Service (Medical): No    Lack of Transportation (Non-Medical): No  Physical Activity: Sufficiently Active (01/15/2023)   Exercise Vital Sign    Days of Exercise per Week: 4 days     Minutes of Exercise per Session: 60 min  Stress: No Stress Concern Present (01/15/2023)   [employer] of Occupational Health - Occupational Stress Questionnaire    Feeling of Stress : Only a little  Social Connections: Socially Integrated (01/15/2023)   Social Connection and Isolation Panel [NHANES]    Frequency of Communication with Friends and Family: Three times a week    Frequency of Social Gatherings with Friends and Family: Three times a week    Attends Religious Services: More than 4 times per year    Active Member of Clubs or Organizations: Yes    Attends Banker Meetings: More than 4 times per year    Marital Status: Married  Catering manager Violence: Not At Risk (09/18/2019)   Humiliation, Afraid, Rape, and Kick questionnaire    Fear of Current or Ex-Partner: No    Emotionally Abused: No    Physically Abused: No  Sexually Abused: No      PHYSICAL EXAM  Vitals:   03/16/23 0953  BP: 131/69  Pulse: 90  Weight: 147 lb 8 oz (66.9 kg)  Height: 5\' 4"  (1.626 m)      Body mass index is 25.32 kg/m.   Generalized: Well developed, in no acute distress   Neurological examination  Mentation: Alert oriented to time, place, history taking. Follows all commands speech and language fluent Cranial nerve II-XII: Pupils were equal round reactive to light. Extraocular movements were full, visual field were full on confrontational test. Facial sensation and strength were normal. Head turning and shoulder shrug  were normal and symmetric. Motor: The motor testing reveals 5 over 5 strength of all 4 extremities. Good symmetric motor tone is noted throughout.  Sensory: Sensory testing is intact to soft touch on all 4 extremities. No evidence of extinction is noted.  Coordination: Cerebellar testing reveals good finger-nose-finger and heel-to-shin bilaterally.  Gait and station: Gait is normal. Tandem gait is normal. Romberg is negative. No drift is seen.  Reflexes:  Deep tendon reflexes are symmetric and normal bilaterally.    DIAGNOSTIC DATA (LABS, IMAGING, TESTING) - I reviewed patient records, labs, notes, testing and imaging myself where available.  Lab Results  Component Value Date   WBC 5.7 01/18/2023   HGB 13.3 01/18/2023   HCT 40.4 01/18/2023   MCV 93.9 01/18/2023   PLT 224.0 01/18/2023      Component Value Date/Time   NA 140 01/18/2023 0927   NA 143 03/12/2014 0000   NA 143 03/12/2014 0000   K 4.4 01/18/2023 0927   CL 101 01/18/2023 0927   CO2 32 01/18/2023 0927   GLUCOSE 200 (H) 01/18/2023 0927   GLUCOSE 126 (H) 11/11/2005 0831   BUN 23 01/18/2023 0927   BUN 27 (A) 03/12/2014 0000   CREATININE 0.93 01/18/2023 0927   CREATININE 0.97 (H) 06/21/2020 1508   CALCIUM 9.5 01/18/2023 0927   PROT 7.0 01/18/2023 0927   ALBUMIN 4.4 01/18/2023 0927   AST 22 01/18/2023 0927   ALT 16 01/18/2023 0927   ALKPHOS 70 01/18/2023 0927   BILITOT 0.3 01/18/2023 0927   GFRNONAA >60 06/03/2020 0851   GFRAA >60 02/10/2019 1817   Lab Results  Component Value Date   CHOL 189 01/18/2023   HDL 87.50 01/18/2023   LDLCALC 88 01/18/2023   LDLDIRECT 148.1 06/11/2006   TRIG 67.0 01/18/2023   CHOLHDL 2 01/18/2023   Lab Results  Component Value Date   HGBA1C 7.7 (H) 01/18/2023   Lab Results  Component Value Date   VITAMINB12 853 06/05/2014   Lab Results  Component Value Date   TSH 3.02 01/18/2023       03/16/2023    9:53 AM 03/17/2022   11:05 AM  Montreal Cognitive Assessment   Visuospatial/ Executive (0/5) 3 4  Naming (0/3) 3 3  Attention: Read list of digits (0/2) 2 2  Attention: Read list of letters (0/1) 1 1  Attention: Serial 7 subtraction starting at 100 (0/3) 2 3  Language: Repeat phrase (0/2) 1 1  Language : Fluency (0/1) 1 1  Abstraction (0/2) 2 2  Delayed Recall (0/5) 5 5  Orientation (0/6) 5 6  Total 25 28  Adjusted Score (based on education)  29       10/19/2017    3:23 PM  MMSE - Mini Mental State Exam   Orientation to time 5  Orientation to Place 5  Registration 3  Attention/ Calculation  5  Recall 3  Language- name 2 objects 2  Language- repeat 1  Language- follow 3 step command 3  Language- read & follow direction 1  Write a sentence 1  Copy design 1  Total score 30      ASSESSMENT AND PLAN  78 y.o. year old female  has a past medical history of Abdominal pain, Anxiety, Cataract, Cervical spondylosis without myelopathy (11/30/2012), Chronic conjunctivitis, Diabetes mellitus, Diverticulosis of colon, GERD with stricture, History of Lyme disease, HOH (hard of hearing), Hypercholesterolemia, Hyperlipidemia, Hypothyroidism, Melanoma (HCC), Memory difficulties (06/05/2014), Osteopenia, Osteoporosis, Solar lentigo, and Trigeminal neuralgia. here with   trigeminal neuralgia  - Plan: amitriptyline (ELAVIL) 25 MG tablet, gabapentin (NEURONTIN) 300 MG capsule  Neuropathy - Plan: amitriptyline (ELAVIL) 25 MG tablet, gabapentin (NEURONTIN) 300 MG capsule  Memory loss  OSA (obstructive sleep apnea)  Ambriel is doing well, today.  She feels that headaches, neck and back pain as well as neuropathy is well managed on amitriptyline 25 mg at bedtime and gabapentin 600 mg in the morning and 600 mg in the evening.  We will continue current treatment plan. She feels her memory is stable. May consider baseline formal neurocognitive testing if she wishes. MOCA 25/30, previously 29/30. Will monitor closely. Memory compensation strategies reviewed. She will continue to follow closely with primary care and endocrinology. Healthy lifestyle habits encouraged. She will follow up with me in 1 year, sooner if needed.   Meds ordered this encounter  Medications   amitriptyline (ELAVIL) 25 MG tablet    Sig: Take 1 tablet (25 mg total) by mouth at bedtime.    Dispense:  90 tablet    Refill:  3    Supervising Provider:   Anson Fret [[id number]]   gabapentin (NEURONTIN) 300 MG capsule    Sig: TAKE TWO CAPSULES IN  THE MORNING AND 2 CAPSULES AT BEDTIME    Dispense:  360 capsule    Refill:  3    Supervising Provider:   Anson Fret [[id number]]     Shawnie Dapper, MSN, FNP-C 03/16/2023, 10:44 AM  [facility] [address], [address] [place], [place] [address] [phone]

## 2023-03-16 ENCOUNTER — Ambulatory Visit (INDEPENDENT_AMBULATORY_CARE_PROVIDER_SITE_OTHER): Payer: Medicare Other | Admitting: Family Medicine

## 2023-03-16 ENCOUNTER — Encounter: Payer: Self-pay | Admitting: Family Medicine

## 2023-03-16 VITALS — BP 131/69 | HR 90 | Ht 64.0 in | Wt 147.5 lb

## 2023-03-16 DIAGNOSIS — G500 Trigeminal neuralgia: Secondary | ICD-10-CM

## 2023-03-16 DIAGNOSIS — R413 Other amnesia: Secondary | ICD-10-CM | POA: Diagnosis not present

## 2023-03-16 DIAGNOSIS — G4733 Obstructive sleep apnea (adult) (pediatric): Secondary | ICD-10-CM

## 2023-03-16 DIAGNOSIS — G629 Polyneuropathy, unspecified: Secondary | ICD-10-CM

## 2023-03-16 MED ORDER — AMITRIPTYLINE HCL 25 MG PO TABS: 25.0000 mg | ORAL_TABLET | Freq: Every day | ORAL | 3 refills | Status: AC

## 2023-03-16 MED ORDER — GABAPENTIN 300 MG PO CAPS: ORAL_CAPSULE | ORAL | 3 refills | Status: AC

## 2023-03-17 DIAGNOSIS — G4733 Obstructive sleep apnea (adult) (pediatric): Secondary | ICD-10-CM | POA: Diagnosis not present

## 2023-03-22 ENCOUNTER — Ambulatory Visit: Payer: Medicare Other | Admitting: Internal Medicine

## 2023-04-07 ENCOUNTER — Ambulatory Visit
Admission: RE | Admit: 2023-04-07 | Discharge: 2023-04-07 | Disposition: A | Discharge status: Home / Self Care | Payer: Medicare Other | Source: Ambulatory Visit | Attending: Family Medicine | Admitting: Family Medicine

## 2023-04-07 DIAGNOSIS — Z1231 Encounter for screening mammogram for malignant neoplasm of breast: Secondary | ICD-10-CM | POA: Diagnosis not present

## 2023-04-19 ENCOUNTER — Other Ambulatory Visit: Payer: Self-pay | Admitting: Adult Health

## 2023-04-19 ENCOUNTER — Other Ambulatory Visit: Payer: Self-pay | Admitting: Family Medicine

## 2023-04-27 DIAGNOSIS — H1131 Conjunctival hemorrhage, right eye: Secondary | ICD-10-CM | POA: Diagnosis not present

## 2023-05-04 ENCOUNTER — Ambulatory Visit: Payer: Medicare Other | Admitting: Internal Medicine

## 2023-05-04 ENCOUNTER — Encounter: Payer: Self-pay | Admitting: Internal Medicine

## 2023-05-04 VITALS — BP 128/64 | HR 88 | Temp 97.7°F | Ht 63.5 in | Wt 144.0 lb

## 2023-05-04 DIAGNOSIS — Z87891 Personal history of nicotine dependence: Secondary | ICD-10-CM

## 2023-05-04 DIAGNOSIS — G4733 Obstructive sleep apnea (adult) (pediatric): Secondary | ICD-10-CM

## 2023-05-04 DIAGNOSIS — R0600 Dyspnea, unspecified: Secondary | ICD-10-CM | POA: Diagnosis not present

## 2023-05-04 DIAGNOSIS — R000 Tachycardia, unspecified: Secondary | ICD-10-CM

## 2023-05-04 DIAGNOSIS — R0609 Other forms of dyspnea: Secondary | ICD-10-CM

## 2023-05-04 LAB — PULMONARY FUNCTION TEST
DL/VA % pred: 87 %
DL/VA: 3.55 ml/min/mmHg/L
DLCO unc % pred: 66 %
DLCO unc: 13.19 ml/min/mmHg
FEF 25-75 Post: 1.2 L/s
FEF 25-75 Pre: 1.28 L/s
FEF2575-%Change-Post: -6 %
FEF2575-%Pred-Post: 72 %
FEF2575-%Pred-Pre: 77 %
FEV1-%Change-Post: -2 %
FEV1-%Pred-Post: 87 %
FEV1-%Pred-Pre: 89 %
FEV1-Post: 1.91 L
FEV1-Pre: 1.96 L
FEV1FVC-%Change-Post: 2 %
FEV1FVC-%Pred-Pre: 93 %
FEV6-%Change-Post: -4 %
FEV6-%Pred-Post: 95 %
FEV6-%Pred-Pre: 100 %
FEV6-Post: 2.64 L
FEV6-Pre: 2.78 L
FEV6FVC-%Change-Post: 0 %
FEV6FVC-%Pred-Post: 103 %
FEV6FVC-%Pred-Pre: 103 %
FVC-%Change-Post: -5 %
FVC-%Pred-Post: 91 %
FVC-%Pred-Pre: 96 %
FVC-Post: 2.68 L
FVC-Pre: 2.83 L
Post FEV1/FVC ratio: 71 %
Post FEV6/FVC ratio: 99 %
Pre FEV1/FVC ratio: 69 %
Pre FEV6/FVC Ratio: 98 %
RV % pred: 105 %
RV: 2.54 L
TLC % pred: 95 %
TLC: 5.04 L

## 2023-05-04 MED ORDER — ANORO ELLIPTA 62.5-25 MCG/ACT IN AEPB: 1.0000 | INHALATION_SPRAY | Freq: Every day | RESPIRATORY_TRACT | 5 refills | Status: AC

## 2023-05-04 NOTE — Progress Notes (Signed)
 HPI F followed for OSA, complicated by Aortic Atherosclerosis, GERD/stricture, Diverticulitis, DM2, Goiter, Hyperlipidemia,  Trigeminal Neuralgia, CKD, Neuropathy, Anxiety/Insomnia, history of spontaneous pneumothorax, pleurisy, and a collapsed lung due to a misplaced feeding tube during a colon resection surgery HST(WatchPat) 12/31/22- AHI 18.4/hr, desat to 70%, body weight 140 lbs PFT 05/04/23- Mild reduction DLCO, otw wnl, no resp to BD ===========================================================================================  01/05/23- 76 yoF referred by Cardiology/ Dr Alda Amas with concern of possible OSA. She was to have an [employer], but raised concern as to whether her Dexcom and Omnipod devices might interfere with Bluetooth. Medical problems include Aortic Atherosclerosis, GERD/stricture, Diverticulitis, DM2, Goiter, Hyperlipidemia,  Trigeminal Neuralgia, CKD, Neuropathy, Anxiety/Insomnia,  -----Snoring, fatigue during day, witnessed apneas x 6 years.  Patient had a WatchPat sleep test done 12/31/2022 ordered by cardiologist Dr. Alda Amas. Discussed the use of AI scribe software for clinical note transcription with the patient, who gave verbal consent to proceed.  History of Present Illness   The patient, with a history of spontaneous pneumothorax, pleurisy, and a collapsed lung due to a misplaced feeding tube during a colon resection surgery, presents with concerns of possible sleep apnea and shortness of breath. The sleep apnea concern arose during a family vacation when the patient's future daughter-in-law, an EMT, noticed the patient's loud snoring and periods of apnea. The patient reports that if she is not actively engaged in an activity, she tends to fall asleep, especially when reading. She takes melatonin to help her sleep but does not consume caffeine due to its hyperactive effects on her.  The patient also reports shortness of breath with exertion, such as walking her  dog or going up her driveway. She notes that this has been a gradual issue over the past couple of years and has not been associated with any specific event. She also mentions a persistent dry, unproductive cough that she has had for years. The patient quit smoking at age 63 after her lung collapsed and has not smoked since. She does not report any wheezing or coughing up anything.    Assessment and Plan    Possible Sleep Apnea Reported loud snoring and periods of apnea during sleep, as observed by others. A WATCHPAT study was conducted but results are not yet available. -Obtain results of WATCHPAT study from Dr. Lavern Potash office for review.  Shortness of Breath Chronic, progressive shortness of breath with exertion. History of spontaneous pneumothorax, pleurisy, and lung injury from misplaced feeding tube. No recent imaging or pulmonary function tests. -Order chest x-ray. -Schedule pulmonary function tests. -Start trial of Trelegy inhaler, one puff daily in the morning, and monitor for improvement in symptoms.    05/04/23- 77yo  F followed for OSA, complicated by Aortic Atherosclerosis, GERD/stricture, Diverticulitis, DM2, Goiter, Hyperlipidemia,  Trigeminal Neuralgia, CKD, Neuropathy, Anxiety/Insomnia, history of spontaneous pneumothorax, pleurisy, and a collapsed lung due to a misplaced feeding tube during a colon resection surgery         HST 12/31/22- AHI 18/hr, desat to 70%, body weight 140 lbs CPAP 5-20/ [facility]   order 02/15/23 Download 100%, AHI 2.4/hr PFT 05/04/23- Mild reduction DLCO, otw wnl, no resp to BD Trelegy sample increased glucose. We will witch to Anoro to avoid ICS. Discussed the use of AI scribe software for clinical note transcription with the patient, who gave verbal consent to proceed.  History of Present Illness   The patient, with a history of OSA, presents with two main concerns. Firstly, she reports that the Trelegy inhaler, which was initially  effective in  managing her respiratory symptoms, caused a significant increase in her blood sugar levels. This was noticed within the first few days of use and continued until the inhaler was finished. The patient has not used the inhaler since and has been waiting for this appointment to discuss the issue.  Secondly, the patient has been experiencing discomfort with her CPAP mask. She has tried both a pillow mask and a nasal mask, but both have caused redness and irritation on her nose and face. The patient has a history of multiple melanoma surgeries on her face and is concerned about the constant wear on her skin. Despite the discomfort, the patient reports that the CPAP machine has been effective in managing her sleep apnea, with her apnea events reducing from eighteen times an hour to about two times an hour. She also reports improved sleep quality.     Assessment and Plan:    Airway hyperreactivity Improvement with Trelegy suggests airway hyperreactivity. Normal PFTs with mildly reduced diffusion capacity. Trelegy likely increased blood sugar, prompting switch to Anoro. - Prescribe Anoro inhaler, one inhalation daily.  Obstructive Sleep Apnea (OSA) Moderate OSA with baseline AHI of 18, reduced to under 3 with CPAP. Discomfort from nasal mask due to facial surgeries and sensitive skin. Discussed alternatives for comfort while maintaining treatment efficacy. - Explore hybrid CPAP masks that do not irritate the bridge of the nose. - Consider keeping multiple types of masks to alternate based on comfort. - If CPAP mask issues persist, consider referral to a specialist for fitted dental appliances.  Tachycardia On metoprolol  for tachycardia. No cardiac concerns affecting PFT results. No change in cardiac management needed. - Continue current management with metoprolol .        ROS-see HPI  + = positive Constitutional:    weight loss, night sweats, fevers, chills, fatigue, lassitude. HEENT:    headaches,  +difficulty swallowing, tooth/dental problems, sore throat,       sneezing, itching, ear ache, nasal congestion, post nasal drip, snoring CV:    chest pain, orthopnea, PND, swelling in lower extremities, anasarca,                                   dizziness, palpitations Resp:   +shortness of breath with exertion or at rest.                productive cough,   non-productive cough, coughing up of blood.              change in color of mucus.  wheezing.   Skin:    rash or lesions. GI: + heartburn, indigestion, abdominal pain, nausea, vomiting, diarrhea,                 change in bowel habits, loss of appetite GU: dysuria, change in color of urine, no urgency or frequency.   flank pain. MS:   joint pain, stiffness, decreased range of motion, back pain. Neuro-     nothing unusual Psych:  change in mood or affect.  depression or +anxiety.   memory loss.  OBJ- Physical Exam General- Alert, Oriented, Affect-appropriate, Distress- none acute Skin- rash-none, lesions- none, excoriation- none Lymphadenopathy- none Head- atraumatic            Eyes- Gross vision intact, PERRLA, conjunctivae and secretions clear            Ears- Hearing, canals-normal  Nose- Clear, no-Septal dev, mucus, polyps, erosion, perforation             Throat- Mallampati III , mucosa clear , drainage- none, tonsils- atrophic, +teeth Neck- flexible , trachea midline, no stridor , thyroid  nl, carotid no bruit Chest - symmetrical excursion , unlabored           Heart/CV- RRR , no murmur , no gallop  , no rub, nl s1 s2                           - JVD- none , edema- none, stasis changes- none, varices- none           Lung- clear to P&A, wheeze- none, cough- none , dullness-none, rub- none           Chest wall-  Abd-  Br/ Gen/ Rectal- Not done, not indicated Extrem- cyanosis- none, clubbing, none, atrophy- none, strength- nl Neuro- grossly intact to observation

## 2023-05-04 NOTE — Progress Notes (Signed)
 Full pft performed today.

## 2023-05-04 NOTE — Patient Instructions (Signed)
 Script sent to try Anoro inhaler  (no steroid so it won't raise your blood sugar)  1 inhalation daily  Order- DME [facility]- please refit CPAP mask of choice- try hybrid mouth and under nose, or mustache style. Continue auto 5-20.

## 2023-05-05 DIAGNOSIS — Z85820 Personal history of malignant melanoma of skin: Secondary | ICD-10-CM | POA: Diagnosis not present

## 2023-05-05 DIAGNOSIS — L821 Other seborrheic keratosis: Secondary | ICD-10-CM | POA: Diagnosis not present

## 2023-05-05 DIAGNOSIS — D225 Melanocytic nevi of trunk: Secondary | ICD-10-CM | POA: Diagnosis not present

## 2023-05-05 DIAGNOSIS — D2271 Melanocytic nevi of right lower limb, including hip: Secondary | ICD-10-CM | POA: Diagnosis not present

## 2023-05-05 DIAGNOSIS — L812 Freckles: Secondary | ICD-10-CM | POA: Diagnosis not present

## 2023-05-05 DIAGNOSIS — I788 Other diseases of capillaries: Secondary | ICD-10-CM | POA: Diagnosis not present

## 2023-05-05 DIAGNOSIS — D1801 Hemangioma of skin and subcutaneous tissue: Secondary | ICD-10-CM | POA: Diagnosis not present

## 2023-05-12 DIAGNOSIS — E1065 Type 1 diabetes mellitus with hyperglycemia: Secondary | ICD-10-CM | POA: Diagnosis not present

## 2023-05-12 DIAGNOSIS — Z794 Long term (current) use of insulin: Secondary | ICD-10-CM | POA: Diagnosis not present

## 2023-05-12 DIAGNOSIS — E039 Hypothyroidism, unspecified: Secondary | ICD-10-CM | POA: Diagnosis not present

## 2023-05-12 DIAGNOSIS — N189 Chronic kidney disease, unspecified: Secondary | ICD-10-CM | POA: Diagnosis not present

## 2023-05-12 DIAGNOSIS — E049 Nontoxic goiter, unspecified: Secondary | ICD-10-CM | POA: Diagnosis not present

## 2023-05-12 DIAGNOSIS — E7800 Pure hypercholesterolemia, unspecified: Secondary | ICD-10-CM | POA: Diagnosis not present

## 2023-05-12 DIAGNOSIS — Z9641 Presence of insulin pump (external) (internal): Secondary | ICD-10-CM | POA: Diagnosis not present

## 2023-05-12 DIAGNOSIS — G609 Hereditary and idiopathic neuropathy, unspecified: Secondary | ICD-10-CM | POA: Diagnosis not present

## 2023-05-19 ENCOUNTER — Encounter: Payer: Self-pay | Admitting: Internal Medicine

## 2023-05-26 DIAGNOSIS — E039 Hypothyroidism, unspecified: Secondary | ICD-10-CM | POA: Diagnosis not present

## 2023-05-26 DIAGNOSIS — G609 Hereditary and idiopathic neuropathy, unspecified: Secondary | ICD-10-CM | POA: Diagnosis not present

## 2023-05-26 DIAGNOSIS — E049 Nontoxic goiter, unspecified: Secondary | ICD-10-CM | POA: Diagnosis not present

## 2023-05-26 DIAGNOSIS — E1065 Type 1 diabetes mellitus with hyperglycemia: Secondary | ICD-10-CM | POA: Diagnosis not present

## 2023-05-26 DIAGNOSIS — Z794 Long term (current) use of insulin: Secondary | ICD-10-CM | POA: Diagnosis not present

## 2023-05-26 DIAGNOSIS — N189 Chronic kidney disease, unspecified: Secondary | ICD-10-CM | POA: Diagnosis not present

## 2023-05-26 DIAGNOSIS — Z9641 Presence of insulin pump (external) (internal): Secondary | ICD-10-CM | POA: Diagnosis not present

## 2023-05-26 DIAGNOSIS — E7800 Pure hypercholesterolemia, unspecified: Secondary | ICD-10-CM | POA: Diagnosis not present

## 2023-05-31 DIAGNOSIS — G5601 Carpal tunnel syndrome, right upper limb: Secondary | ICD-10-CM | POA: Diagnosis not present

## 2023-05-31 DIAGNOSIS — M79641 Pain in right hand: Secondary | ICD-10-CM | POA: Diagnosis not present

## 2023-05-31 DIAGNOSIS — M19041 Primary osteoarthritis, right hand: Secondary | ICD-10-CM | POA: Diagnosis not present

## 2023-05-31 DIAGNOSIS — M79642 Pain in left hand: Secondary | ICD-10-CM | POA: Diagnosis not present

## 2023-07-19 ENCOUNTER — Ambulatory Visit: Payer: Medicare Other | Admitting: Family Medicine

## 2023-07-19 DIAGNOSIS — M25571 Pain in right ankle and joints of right foot: Secondary | ICD-10-CM | POA: Diagnosis not present

## 2023-07-29 NOTE — Telephone Encounter (Signed)
 Late entry, results given, patient verbalized understanding

## 2023-07-30 ENCOUNTER — Other Ambulatory Visit: Payer: Self-pay | Admitting: Cardiology

## 2023-07-31 NOTE — Progress Notes (Unsigned)
 HPI F followed for OSA, complicated by Aortic Atherosclerosis, GERD/stricture, Diverticulitis, DM2, Goiter, Hyperlipidemia,  Trigeminal Neuralgia, CKD, Neuropathy, Anxiety/Insomnia, history of spontaneous pneumothorax, pleurisy, and a collapsed lung due to a misplaced feeding tube during a colon resection surgery HST(WatchPat) 12/31/22- AHI 18.4/hr, desat to 70%, body weight 140 lbs PFT 05/04/23- Mild reduction DLCO, otw wnl, no resp to BD ===========================================================================================   05/04/23- 78yo  F followed for OSA, complicated by Aortic Atherosclerosis, GERD/stricture, Diverticulitis, DM2, Goiter, Hyperlipidemia,  Trigeminal Neuralgia, CKD, Neuropathy, Anxiety/Insomnia, history of spontaneous pneumothorax, pleurisy, and a collapsed lung due to a misplaced feeding tube during a colon resection surgery         HST 12/31/22- AHI 18/hr, desat to 70%, body weight 140 lbs CPAP 5-20/ [facility]   order 02/15/23 Download 100%, AHI 2.4/hr PFT 05/04/23- Mild reduction DLCO, otw wnl, no resp to BD Trelegy sample increased glucose. We will witch to Anoro to avoid ICS. Discussed the use of AI scribe software for clinical note transcription with the patient, who gave verbal consent to proceed.  History of Present Illness   The patient, with a history of OSA, presents with two main concerns. Firstly, she reports that the Trelegy inhaler, which was initially effective in managing her respiratory symptoms, caused a significant increase in her blood sugar levels. This was noticed within the first few days of use and continued until the inhaler was finished. The patient has not used the inhaler since and has been waiting for this appointment to discuss the issue.  Secondly, the patient has been experiencing discomfort with her CPAP mask. She has tried both a pillow mask and a nasal mask, but both have caused redness and irritation on her nose and face. The patient has  a history of multiple melanoma surgeries on her face and is concerned about the constant wear on her skin. Despite the discomfort, the patient reports that the CPAP machine has been effective in managing her sleep apnea, with her apnea events reducing from eighteen times an hour to about two times an hour. She also reports improved sleep quality.     Assessment and Plan:    Airway hyperreactivity Improvement with Trelegy suggests airway hyperreactivity. Normal PFTs with mildly reduced diffusion capacity. Trelegy likely increased blood sugar, prompting switch to Anoro. - Prescribe Anoro inhaler, one inhalation daily.  Obstructive Sleep Apnea (OSA) Moderate OSA with baseline AHI of 18, reduced to under 3 with CPAP. Discomfort from nasal mask due to facial surgeries and sensitive skin. Discussed alternatives for comfort while maintaining treatment efficacy. - Explore hybrid CPAP masks that do not irritate the bridge of the nose. - Consider keeping multiple types of masks to alternate based on comfort. - If CPAP mask issues persist, consider referral to a specialist for fitted dental appliances.  Tachycardia On metoprolol  for tachycardia. No cardiac concerns affecting PFT results. No change in cardiac management needed. - Continue current management with metoprolol .     08/03/23- 78yo  F followed for OSA, complicated by Aortic Atherosclerosis, GERD/stricture, Diverticulitis, DM2, Goiter, Hyperlipidemia,  Trigeminal Neuralgia, CKD, Neuropathy, Anxiety/Insomnia, history of spontaneous pneumothorax, pleurisy, and a collapsed lung due to a misplaced feeding tube during a colon resection surgery         CPAP 5-20/ [facility]   order 02/15/23 Download compliance- 90%, AHI 2.4/hr  Body weight today- 154 lbs Discussed the use of AI scribe software for clinical note transcription with the patient, who gave verbal consent to proceed.  History of Present Illness  [name] [name] is a 78 year old female with  sleep apnea who presents with issues related to CPAP mask fitting and usage.  She experiences ongoing issues with CPAP mask fitting and usage. A recent visit to [facility] resulted in receiving a mask that covers her mouth, but she is unsure of its fit. She uses a chin strap to stabilize the mask, but it moves during sleep, leading her to revert to a nasal mask. Her husband suggested using tape across her mouth, but this caused loud breathing noises. She has returned to using the nasal mask, which is effective, as indicated by her morning patterns of 2.1 or 1.8. She attempted to order nasal masks for three months, but insurance allows only monthly orders. Despite this, she received five nasal masks via [employer]. Download reviewed. She recognizes benefit from CPAP with prevention of snoring. We discussed alternatives again, and will help her explore a fitted oral appliance.      Assessment and Plan:    Sleep apnea Moderate sleep apnea with effective CPAP therapy, but mask fit issues persist. Discussed alternative treatments: fitted mouthpiece and Inspire surgery. Mouthpiece considered despite moderate score. Inspire surgery reserved for treatment failure. - Refer to Dr. Oneil Forget for fitted mouthpiece evaluation. - Continue CPAP therapy with nasal mask. - Consider Inspire surgery if CPAP and mouthpiece fail. - Advise using cotton balls for mask irritation.  Trigeminal neuralgia Trigeminal neuralgia may affect mouthpiece use for sleep apnea. - Discuss trigeminal neuralgia with Dr. Oneil Forget during mouthpiece evaluation.     ROS-see HPI  + = positive Constitutional:    weight loss, night sweats, fevers, chills, fatigue, lassitude. HEENT:    headaches, +difficulty swallowing, tooth/dental problems, sore throat,       sneezing, itching, ear ache, nasal congestion, post nasal drip, snoring CV:    chest pain, orthopnea, PND, swelling in lower extremities, anasarca,                                    dizziness, palpitations Resp:   +shortness of breath with exertion or at rest.                productive cough,   non-productive cough, coughing up of blood.              change in color of mucus.  wheezing.   Skin:    rash or lesions. GI: + heartburn, indigestion, abdominal pain, nausea, vomiting, diarrhea,                 change in bowel habits, loss of appetite GU: dysuria, change in color of urine, no urgency or frequency.   flank pain. MS:   joint pain, stiffness, decreased range of motion, back pain. Neuro-     nothing unusual Psych:  change in mood or affect.  depression or +anxiety.   memory loss.  OBJ- Physical Exam General- Alert, Oriented, Affect-appropriate, Distress- none acute Skin- rash-none, lesions- none, excoriation- none Lymphadenopathy- none Head- atraumatic            Eyes- Gross vision intact, PERRLA, conjunctivae and secretions clear            Ears- +hearing aids            Nose- Clear, no-Septal dev, mucus, polyps, erosion, perforation             Throat- Mallampati III , mucosa clear ,  drainage- none, tonsils- atrophic, +teeth Neck- flexible , trachea midline, no stridor , thyroid  nl, carotid no bruit Chest - symmetrical excursion , unlabored           Heart/CV- RRR , no murmur , no gallop  , no rub, nl s1 s2                           - JVD- none , edema- none, stasis changes- none, varices- none           Lung- clear to P&A, wheeze- none, cough- none , dullness-none, rub- none           Chest wall-  Abd-  Br/ Gen/ Rectal- Not done, not indicated Extrem- cyanosis- none, clubbing, none, atrophy- none, strength- nl Neuro- grossly intact to observation

## 2023-08-03 ENCOUNTER — Ambulatory Visit (INDEPENDENT_AMBULATORY_CARE_PROVIDER_SITE_OTHER): Admitting: Internal Medicine

## 2023-08-03 ENCOUNTER — Encounter: Payer: Self-pay | Admitting: Internal Medicine

## 2023-08-03 VITALS — BP 115/70 | HR 79 | Temp 98.1°F | Resp 18 | Ht 64.0 in | Wt 154.6 lb

## 2023-08-03 DIAGNOSIS — G4733 Obstructive sleep apnea (adult) (pediatric): Secondary | ICD-10-CM | POA: Diagnosis not present

## 2023-08-03 DIAGNOSIS — G629 Polyneuropathy, unspecified: Secondary | ICD-10-CM

## 2023-08-03 DIAGNOSIS — G500 Trigeminal neuralgia: Secondary | ICD-10-CM | POA: Diagnosis not present

## 2023-08-03 DIAGNOSIS — Z23 Encounter for immunization: Secondary | ICD-10-CM

## 2023-08-03 NOTE — Patient Instructions (Signed)
 Order- referral to Orthodontist Dr Oneil Forget, DDS    consider oral appliance for OSA

## 2023-08-04 ENCOUNTER — Encounter: Payer: Self-pay | Admitting: Internal Medicine

## 2023-08-04 NOTE — Addendum Note (Signed)
 Addended by: Jordin Vicencio M on: 08/04/2023 09:41 AM   Modules accepted: Orders

## 2023-09-12 ENCOUNTER — Other Ambulatory Visit: Payer: Self-pay | Admitting: Family Medicine

## 2023-09-12 DIAGNOSIS — F411 Generalized anxiety disorder: Secondary | ICD-10-CM

## 2023-09-27 ENCOUNTER — Telehealth: Payer: Self-pay | Admitting: Internal Medicine

## 2023-09-27 NOTE — Telephone Encounter (Signed)
 We have a copy of the original study in the pt's chart. We just need their fax number and we can give them a copy. We can do this tomorrow when both offices are open.

## 2023-09-27 NOTE — Telephone Encounter (Signed)
 Copied from CRM [id number]. Topic: General - Other >> Sep 27, 2023  2:27 PM Shona S wrote: Reason for CRM: dr oneil forget office called to get original sleep study from us . Called cal and was told that the patient had her sleep study done in January at the [facility], phone number for heart center [phone].

## 2023-09-28 NOTE — Telephone Encounter (Signed)
 Faxing sleep study will place confirmation in provider cabinet.NFN

## 2023-10-12 DIAGNOSIS — M542 Cervicalgia: Secondary | ICD-10-CM | POA: Diagnosis not present

## 2023-10-12 DIAGNOSIS — Z1330 Encounter for screening examination for mental health and behavioral disorders, unspecified: Secondary | ICD-10-CM | POA: Diagnosis not present

## 2023-10-12 DIAGNOSIS — M47812 Spondylosis without myelopathy or radiculopathy, cervical region: Secondary | ICD-10-CM | POA: Diagnosis not present

## 2023-10-12 DIAGNOSIS — M791 Myalgia, unspecified site: Secondary | ICD-10-CM | POA: Diagnosis not present

## 2023-10-13 ENCOUNTER — Other Ambulatory Visit: Payer: Self-pay | Admitting: Family Medicine

## 2023-10-22 DIAGNOSIS — Z9641 Presence of insulin pump (external) (internal): Secondary | ICD-10-CM | POA: Diagnosis not present

## 2023-10-22 DIAGNOSIS — E1065 Type 1 diabetes mellitus with hyperglycemia: Secondary | ICD-10-CM | POA: Diagnosis not present

## 2023-10-22 DIAGNOSIS — Z794 Long term (current) use of insulin: Secondary | ICD-10-CM | POA: Diagnosis not present

## 2023-10-22 DIAGNOSIS — E039 Hypothyroidism, unspecified: Secondary | ICD-10-CM | POA: Diagnosis not present

## 2023-11-01 DIAGNOSIS — H0102B Squamous blepharitis left eye, upper and lower eyelids: Secondary | ICD-10-CM | POA: Diagnosis not present

## 2023-11-01 DIAGNOSIS — H0102A Squamous blepharitis right eye, upper and lower eyelids: Secondary | ICD-10-CM | POA: Diagnosis not present

## 2023-11-01 DIAGNOSIS — E119 Type 2 diabetes mellitus without complications: Secondary | ICD-10-CM | POA: Diagnosis not present

## 2023-11-01 DIAGNOSIS — Z961 Presence of intraocular lens: Secondary | ICD-10-CM | POA: Diagnosis not present

## 2023-11-01 LAB — OPHTHALMOLOGY REPORT-SCANNED

## 2023-11-03 ENCOUNTER — Encounter: Payer: Self-pay | Admitting: Endocrinology

## 2023-11-05 DIAGNOSIS — M542 Cervicalgia: Secondary | ICD-10-CM | POA: Diagnosis not present

## 2023-11-05 DIAGNOSIS — M546 Pain in thoracic spine: Secondary | ICD-10-CM | POA: Diagnosis not present

## 2023-11-05 DIAGNOSIS — M47812 Spondylosis without myelopathy or radiculopathy, cervical region: Secondary | ICD-10-CM | POA: Diagnosis not present

## 2023-11-05 DIAGNOSIS — M791 Myalgia, unspecified site: Secondary | ICD-10-CM | POA: Diagnosis not present

## 2023-11-08 DIAGNOSIS — D485 Neoplasm of uncertain behavior of skin: Secondary | ICD-10-CM | POA: Diagnosis not present

## 2023-11-08 DIAGNOSIS — L812 Freckles: Secondary | ICD-10-CM | POA: Diagnosis not present

## 2023-11-08 DIAGNOSIS — D2272 Melanocytic nevi of left lower limb, including hip: Secondary | ICD-10-CM | POA: Diagnosis not present

## 2023-11-08 DIAGNOSIS — D2271 Melanocytic nevi of right lower limb, including hip: Secondary | ICD-10-CM | POA: Diagnosis not present

## 2023-11-08 DIAGNOSIS — D2371 Other benign neoplasm of skin of right lower limb, including hip: Secondary | ICD-10-CM | POA: Diagnosis not present

## 2023-11-08 DIAGNOSIS — D1801 Hemangioma of skin and subcutaneous tissue: Secondary | ICD-10-CM | POA: Diagnosis not present

## 2023-11-08 DIAGNOSIS — L821 Other seborrheic keratosis: Secondary | ICD-10-CM | POA: Diagnosis not present

## 2023-11-08 DIAGNOSIS — D225 Melanocytic nevi of trunk: Secondary | ICD-10-CM | POA: Diagnosis not present

## 2023-11-08 DIAGNOSIS — D2262 Melanocytic nevi of left upper limb, including shoulder: Secondary | ICD-10-CM | POA: Diagnosis not present

## 2023-11-17 DIAGNOSIS — M6281 Muscle weakness (generalized): Secondary | ICD-10-CM | POA: Diagnosis not present

## 2023-11-17 DIAGNOSIS — M542 Cervicalgia: Secondary | ICD-10-CM | POA: Diagnosis not present

## 2023-11-22 DIAGNOSIS — M542 Cervicalgia: Secondary | ICD-10-CM | POA: Diagnosis not present

## 2023-11-22 DIAGNOSIS — M6281 Muscle weakness (generalized): Secondary | ICD-10-CM | POA: Diagnosis not present

## 2023-11-24 DIAGNOSIS — E039 Hypothyroidism, unspecified: Secondary | ICD-10-CM | POA: Diagnosis not present

## 2023-11-24 DIAGNOSIS — E1065 Type 1 diabetes mellitus with hyperglycemia: Secondary | ICD-10-CM | POA: Diagnosis not present

## 2023-11-26 DIAGNOSIS — M6281 Muscle weakness (generalized): Secondary | ICD-10-CM | POA: Diagnosis not present

## 2023-11-26 DIAGNOSIS — M542 Cervicalgia: Secondary | ICD-10-CM | POA: Diagnosis not present

## 2023-11-29 DIAGNOSIS — M542 Cervicalgia: Secondary | ICD-10-CM | POA: Diagnosis not present

## 2023-11-29 DIAGNOSIS — M6281 Muscle weakness (generalized): Secondary | ICD-10-CM | POA: Diagnosis not present

## 2023-12-01 DIAGNOSIS — N189 Chronic kidney disease, unspecified: Secondary | ICD-10-CM | POA: Diagnosis not present

## 2023-12-01 DIAGNOSIS — E039 Hypothyroidism, unspecified: Secondary | ICD-10-CM | POA: Diagnosis not present

## 2023-12-01 DIAGNOSIS — M542 Cervicalgia: Secondary | ICD-10-CM | POA: Diagnosis not present

## 2023-12-01 DIAGNOSIS — Z9641 Presence of insulin pump (external) (internal): Secondary | ICD-10-CM | POA: Diagnosis not present

## 2023-12-01 DIAGNOSIS — E1065 Type 1 diabetes mellitus with hyperglycemia: Secondary | ICD-10-CM | POA: Diagnosis not present

## 2023-12-01 DIAGNOSIS — E7800 Pure hypercholesterolemia, unspecified: Secondary | ICD-10-CM | POA: Diagnosis not present

## 2023-12-01 DIAGNOSIS — E049 Nontoxic goiter, unspecified: Secondary | ICD-10-CM | POA: Diagnosis not present

## 2023-12-01 DIAGNOSIS — Z794 Long term (current) use of insulin: Secondary | ICD-10-CM | POA: Diagnosis not present

## 2023-12-02 DIAGNOSIS — L0889 Other specified local infections of the skin and subcutaneous tissue: Secondary | ICD-10-CM | POA: Diagnosis not present

## 2023-12-02 DIAGNOSIS — L080 Pyoderma: Secondary | ICD-10-CM | POA: Diagnosis not present

## 2023-12-06 DIAGNOSIS — M542 Cervicalgia: Secondary | ICD-10-CM | POA: Diagnosis not present

## 2023-12-06 DIAGNOSIS — M6281 Muscle weakness (generalized): Secondary | ICD-10-CM | POA: Diagnosis not present

## 2023-12-10 DIAGNOSIS — M6281 Muscle weakness (generalized): Secondary | ICD-10-CM | POA: Diagnosis not present

## 2023-12-10 DIAGNOSIS — M542 Cervicalgia: Secondary | ICD-10-CM | POA: Diagnosis not present

## 2023-12-13 DIAGNOSIS — M6281 Muscle weakness (generalized): Secondary | ICD-10-CM | POA: Diagnosis not present

## 2023-12-13 DIAGNOSIS — M542 Cervicalgia: Secondary | ICD-10-CM | POA: Diagnosis not present

## 2024-01-14 ENCOUNTER — Encounter: Payer: Self-pay | Admitting: Cardiology

## 2024-02-08 NOTE — Progress Notes (Unsigned)
 " Cardiology Office Note:    Date:  02/09/2024   ID:  [name] [name], DOB [birth year], MRN [id number]  PCP:  Mahlon Comer BRAVO, MD  Cardiologist:  Lonni LITTIE Nanas, MD  Electrophysiologist:  None   Referring MD: Mahlon Comer BRAVO, MD   Chief Complaint  Patient presents with   Shortness of Breath    History of Present Illness:    [name] [name] is a 79 y.o. female with a hx of T2DM, hyperlipidemia, hypothyroidism, melanoma who presents for follow-up.  She was referred by Dr. Mahlon for evaluation of aortic atherosclerosis, initially seen on 08/29/2018.  Noted on CT abdomen pelvis 06/03/2020.    Echocardiogram on 09/30/2020 showed normal biventricular function, grade 1 diastolic dysfunction, no significant valvular disease.  Lexiscan  Myoview  on 09/30/2020 showed normal perfusion, EF 69%.  Calcium  score on 09/30/2020 was 213 (75th percentile).  Zio patch x 14 days 02/2022 showed 52 episodes of SVT, longest lasting 9 seconds with average rate 155 bpm.  Stress PET 06/17/2022 showed normal perfusion, normal myocardial blood flow reserve, EF 68%, moderate coronary calcifications.  Since last clinic visit, she is doing okay.  Continues to have shortness of breath.  Denies any chest pain.  Reports palpitations have improved.  She denies any lightheadedness, syncope, lower extremity edema.  Past Medical History:  Diagnosis Date   Abdominal pain    EGD and CTs neg, rx Amitriptiline (non ulcer dyspepsia) EGD 12/11.. s/p ballon dilitation   Anxiety    Cataract    Cervical spondylosis without myelopathy 11/30/2012   Chronic conjunctivitis    Diabetes mellitus    no meds, diet controlled   Diverticulosis of colon    GERD with stricture    s/p dilatations   History of Lyme disease    HOH (hard of hearing)    bilateral hearing aids   Hypercholesterolemia    Hyperlipidemia    Hypothyroidism     Dx 10-9 TSH 5.2 , then TSH normalized, eventually went up again 2012   Melanoma [facility])    h/o    Memory difficulties 06/05/2014   Osteopenia    Osteoporosis    Solar lentigo    face; s/p excision 3/09. See Derm q 6 months   Trigeminal neuralgia    h/o     Past Surgical History:  Procedure Laterality Date   ABDOMINAL HYSTERECTOMY     ANKLE SURGERY  07/01/2015   R andkle tendon surgery   BREAST BIOPSY Left 1988   BREAST EXCISIONAL BIOPSY Left 1988   CATARACT EXTRACTION Bilateral    CERVICAL FUSION  06/2017   CHOLECYSTECTOMY     COLON RESECTION  1999   d/t diverticulitis   COLON SURGERY     COLONOSCOPY     ESOPHAGEAL MANOMETRY N/A 03/17/2019   Procedure: ESOPHAGEAL MANOMETRY (EM)  NO PH;  Surgeon: San Sandor GAILS, DO;  Location: [facility];  Service: Gastroenterology;  Laterality: N/A;   LUMBAR DISC SURGERY  07/25/18, 09/20   melanoma resection Right     Right Arm Surgery, R facial surgery (05-2012)      PTX s/t chest tube     SHOULDER SURGERY     right   UPPER GASTROINTESTINAL ENDOSCOPY     WRIST SURGERY     removed melaoma, right    Current Medications: Current Meds  Medication Sig   ALPRAZolam  (XANAX ) 0.5 MG tablet TAKE 1 TABLET(0.5 MG) BY MOUTH AT BEDTIME AS NEEDED FOR SLEEP   amitriptyline  (ELAVIL ) 25 MG tablet  Take 1 tablet (25 mg total) by mouth at bedtime.   aspirin  81 MG EC tablet Take 1 tablet (81 mg total) by mouth daily. Swallow whole.   AZASITE  1 % ophthalmic solution Apply 1 drop to eye as needed (dry eyes).   baclofen (LIORESAL) 10 MG tablet Take 10 mg by mouth 2 (two) times daily. May take TID is needed   BAQSIMI ONE PACK 3 MG/DOSE POWD Place into both nostrils as needed (low blood sugar).   Cholecalciferol (VITAMIN D3) 2000 UNITS TABS Take 1 tablet by mouth daily.   diclofenac Sodium (VOLTAREN) 1 % GEL Apply 2 g topically as needed.   gabapentin  (NEURONTIN ) 300 MG capsule TAKE TWO CAPSULES IN THE MORNING AND 2 CAPSULES AT BEDTIME   hyoscyamine  (LEVSIN  SL) 0.125 MG SL tablet Take 0.125 mg by mouth as needed.   Insulin  Disposable Pump (OMNIPOD 5  DEXG7G6 INTRO GEN 5) KIT by Does not apply route.   insulin  lispro (HUMALOG) 100 UNIT/ML injection Inject into the skin.   Lancets (ONETOUCH DELICA PLUS LANCET33G) MISC Apply topically 2 (two) times daily.   levothyroxine (SYNTHROID, LEVOTHROID) 50 MCG tablet Take 50 mcg by mouth daily before breakfast. Take 50mcg M-F and 100mcg on Sat and Sun   Lifitegrast 5 % SOLN Apply 1 each to eye as needed.   metoprolol  succinate (TOPROL -XL) 25 MG 24 hr tablet TAKE 1 TABLET(25 MG) BY MOUTH EVERY EVENING   MIEBO 1.338 GM/ML SOLN Place 1 drop into both eyes 4 (four) times daily.   Multiple Vitamin (MULTIVITAMIN) tablet Take 1 tablet by mouth daily.   ONETOUCH VERIO test strip 2 (two) times daily.   pantoprazole  (PROTONIX ) 40 MG tablet TAKE 1 TABLET(40 MG) BY MOUTH DAILY   Peppermint Oil (IBGARD) 90 MG CPCR Take as directed as needed   polyethylene glycol (MIRALAX / GLYCOLAX) 17 g packet Take 34 g by mouth daily.   rosuvastatin  (CRESTOR ) 20 MG tablet TAKE 1 TABLET(20 MG) BY MOUTH DAILY   umeclidinium-vilanterol (ANORO ELLIPTA ) 62.5-25 MCG/ACT AEPB Inhale 1 puff into the lungs daily.     Allergies:   Rocephin [ceftriaxone], Prednisone, Wound dressing adhesive, Ibandronate, Other, Tape, Tramadol hcl, Diphenhydramine  hcl, Repaglinide, and Sitagliptin   Social History   Socioeconomic History   Marital status: Married    Spouse name: [place]   Number of children: 2   Years of education: college   Highest education level: 12th grade  Occupational History   Occupation: retired    Associate Professor: RETIRED  Tobacco Use   Smoking status: Former    Current packs/day: 0.00    Types: Cigarettes    Start date: 01/20/1976    Quit date: 1981    Years since quitting: 45.0   Smokeless tobacco: Never  Vaping Use   Vaping status: Never Used  Substance and Sexual Activity   Alcohol use: Not Currently   Drug use: No   Sexual activity: Not Currently  Other Topics Concern   Not on file  Social History Narrative    Patient is married with 2 children.   Patient is right handed.   Patient has college education.   Patient does not drink caffeine.          Social Drivers of Health   Tobacco Use: Medium Risk (11/05/2023)   Received from [facility]   Patient History    Smoking Tobacco Use: Former    Smokeless Tobacco Use: Never    Passive Exposure: Not on file  Financial Resource Strain: Low Risk (  10/12/2023)   Received from [facility]   Overall Financial Resource Strain (CARDIA)    How hard is it for you to pay for the very basics like food, housing, medical care, and heating?: Not hard at all  Food Insecurity: Unknown (10/12/2023)   Received from [facility]    Worried About Running Out of Food in the Last Year: Not on file    Within the past 12 months, the food you bought just didn't last and you didn't have money to get more.: Never true  Transportation Needs: No Transportation Needs (10/12/2023)   Received from [facility]   Epic    In the past 12 months, has lack of transportation kept you from medical appointments or from getting medications?: No    In the past 12 months, has lack of transportation kept you from meetings, work, or from getting things needed for daily living?: No  Physical Activity: Sufficiently Active (01/15/2023)   Exercise Vital Sign    Days of Exercise per Week: 4 days    Minutes of Exercise per Session: 60 min  Stress: No Stress Concern Present (01/15/2023)   [employer] of Occupational Health - Occupational Stress Questionnaire    Feeling of Stress : Only a little  Social Connections: Socially Integrated (01/15/2023)   Social Connection and Isolation Panel    Frequency of Communication with Friends and Family: Three times a week    Frequency of Social Gatherings with Friends and Family: Three times a week    Attends Religious Services: More than 4 times per year    Active Member of Clubs or Organizations: Yes    Attends Banker  Meetings: More than 4 times per year    Marital Status: Married  Depression (PHQ2-9): Low Risk (01/18/2023)   Depression (PHQ2-9)    PHQ-2 Score: 3  Alcohol Screen: Low Risk (01/15/2023)   Alcohol Screen    Last Alcohol Screening Score (AUDIT): 1  Housing: Low Risk (10/12/2023)   Received from [facility]    In the last 12 months, was there a time when you were not able to pay the mortgage or rent on time?: No    In the past 12 months, how many times have you moved where you were living?: 0    At any time in the past 12 months, were you homeless or living in a shelter (including now)?: No  Utilities: Not At Risk (10/12/2023)   Received from [facility]    In the past 12 months has the electric, gas, oil, or water company threatened to shut off services in your home?: No  Health Literacy: Not on file     Family History: The patient'sfamily history includes Endometrial cancer in her sister; Heart attack in her paternal grandfather; Melanoma in her maternal uncle; Prostate cancer in her father; Stroke in her sister. There is no history of Colon cancer, Breast cancer, Diabetes, Stomach cancer, Rectal cancer, or Esophageal cancer.  ROS:   Please see the history of present illness.     All other systems reviewed and are negative.  EKGs/Labs/Other Studies Reviewed:    The following studies were reviewed today:   EKG:   02/27/21: Normal sinus rhythm, rate 84, biatrial enlargement 08/12/22: Normal sinus rhythm, rate 73, right atrial enlargement 02/09/2024: Normal sinus rhythm, rate 73, no ST abnormalities   Recent Labs: No results found for requested labs within last 365 days.  Recent Lipid  Panel    Component Value Date/Time   CHOL 189 01/18/2023 0927   CHOL 170 02/28/2021 0916   TRIG 67.0 01/18/2023 0927   TRIG 37 11/11/2005 0831   HDL 87.50 01/18/2023 0927   HDL 84 02/28/2021 0916   CHOLHDL 2 01/18/2023 0927   VLDL 13.4 01/18/2023 0927   LDLCALC 88 01/18/2023  0927   LDLCALC 76 02/28/2021 0916   LDLCALC 82 06/21/2020 1508   LDLDIRECT 148.1 06/11/2006 1458    Physical Exam:    VS:  BP 124/72 (BP Location: Right Arm, Patient Position: Sitting, Cuff Size: Normal)   Pulse 73   Ht 5' 4.8 (1.646 m)   Wt 158 lb 12.8 oz (72 kg)   SpO2 96%   BMI 26.59 kg/m     Wt Readings from Last 3 Encounters:  02/09/24 158 lb 12.8 oz (72 kg)  08/03/23 154 lb 9.6 oz (70.1 kg)  05/04/23 144 lb (65.3 kg)     GEN:  Well nourished, well developed in no acute distress HEENT: Normal NECK: No JVD; No carotid bruits CARDIAC: RRR, no murmurs, rubs, gallops RESPIRATORY:  Clear to auscultation without rales, wheezing or rhonchi  ABDOMEN: Soft, non-tender, non-distended MUSCULOSKELETAL:  No edema; No deformity  SKIN: Warm and dry NEUROLOGIC:  Alert and oriented x 3 PSYCHIATRIC:  Normal affect   ASSESSMENT:    1. Palpitations   2. Hyperlipidemia, unspecified hyperlipidemia type   3. Coronary artery disease involving native coronary artery of native heart without angina pectoris   4. SOB (shortness of breath)   5. OSA (obstructive sleep apnea)       PLAN:    Chest pain/DOE: Chest pain is atypical in description, but also having dyspnea/lightheadedness with exertion.  Echocardiogram on 09/30/2020 showed normal biventricular function, grade 1 diastolic dysfunction, no significant valvular disease.  Lexiscan  Myoview  on 09/30/2020 showed normal perfusion, EF 69%.  Stress PET 06/17/2022 showed normal perfusion, normal myocardial blood flow reserve, EF 68%, moderate coronary calcifications. Normal BNP 07/2022.  Suspect shortness of breath is noncardiac in etiology  Palpitations: Zio patch x 14 days 02/2022 showed 52 episodes of SVT, longest lasting 9 seconds with average rate 155 bpm.  Started on Toprol -XL 25 mg daily.  Reports improvement  Hyperlipidemia: On rosuvastatin  10 mg daily.  LDL 82 on 07/18/2020.  Calcium  score on 09/30/2020 was 213 (75th percentile),  rosuvastatin  dose increased to 20 mg.  LDL 88 12/2022.  Will update lipid panel  T2DM: on insulin .  A1c 7.1% on 11/2023  OSA: Positive sleep study 12/2022, tried CPAP but unable to tolerate.  Using dental appliance  RTC in 1 year    Medication Adjustments/Labs and Tests Ordered: Current medicines are reviewed at length with the patient today.  Concerns regarding medicines are outlined above.  Orders Placed This Encounter  Procedures   Lipid panel   EKG 12-Lead    No orders of the defined types were placed in this encounter.    Patient Instructions  Medication Instructions:  Your physician recommends that you continue on your current medications as directed. Please refer to the Current Medication list given to you today.  *If you need a refill on your cardiac medications before your next appointment, please call your pharmacy*  Lab Work: LIPID TODAY If you have labs (blood work) drawn today and your tests are completely normal, you will receive your results only by: MyChart Message (if you have MyChart) OR A paper copy in the mail If you have any lab test  that is abnormal or we need to change your treatment, we will call you to review the results.  Testing/Procedures: NONE  Follow-Up: At [facility], you and your health needs are our priority.  As part of our continuing mission to provide you with exceptional heart care, our providers are all part of one team.  This team includes your primary Cardiologist (physician) and Advanced Practice Providers or APPs (Physician Assistants and Nurse Practitioners) who all work together to provide you with the care you need, when you need it.  Your next appointment:   1 YEAR  Provider:   Dr..Lamekia Nolden  We recommend signing up for the patient portal called MyChart.  Sign up information is provided on this After Visit Summary.  MyChart is used to connect with patients for Virtual Visits (Telemedicine).  Patients are able to  view lab/test results, encounter notes, upcoming appointments, etc.  Non-urgent messages can be sent to your provider as well.   To learn more about what you can do with MyChart, go to [email or web address].   Other Instructions NONE            Signed, Lonni LITTIE Nanas, MD  02/09/2024 10:20 AM    [facility] "

## 2024-02-09 ENCOUNTER — Ambulatory Visit (INDEPENDENT_AMBULATORY_CARE_PROVIDER_SITE_OTHER): Admitting: Family Medicine

## 2024-02-09 ENCOUNTER — Encounter: Payer: Self-pay | Admitting: Family Medicine

## 2024-02-09 ENCOUNTER — Ambulatory Visit: Attending: Cardiology | Admitting: Cardiology

## 2024-02-09 VITALS — BP 124/72 | HR 73 | Ht 64.8 in | Wt 158.8 lb

## 2024-02-09 VITALS — BP 122/80 | HR 82 | Wt 158.8 lb

## 2024-02-09 DIAGNOSIS — E1169 Type 2 diabetes mellitus with other specified complication: Secondary | ICD-10-CM | POA: Diagnosis not present

## 2024-02-09 DIAGNOSIS — Z23 Encounter for immunization: Secondary | ICD-10-CM | POA: Diagnosis not present

## 2024-02-09 DIAGNOSIS — Z6826 Body mass index (BMI) 26.0-26.9, adult: Secondary | ICD-10-CM

## 2024-02-09 DIAGNOSIS — E663 Overweight: Secondary | ICD-10-CM | POA: Diagnosis not present

## 2024-02-09 DIAGNOSIS — F411 Generalized anxiety disorder: Secondary | ICD-10-CM

## 2024-02-09 DIAGNOSIS — E785 Hyperlipidemia, unspecified: Secondary | ICD-10-CM

## 2024-02-09 DIAGNOSIS — I2510 Atherosclerotic heart disease of native coronary artery without angina pectoris: Secondary | ICD-10-CM | POA: Diagnosis present

## 2024-02-09 DIAGNOSIS — R0602 Shortness of breath: Secondary | ICD-10-CM | POA: Diagnosis present

## 2024-02-09 DIAGNOSIS — E1142 Type 2 diabetes mellitus with diabetic polyneuropathy: Secondary | ICD-10-CM

## 2024-02-09 DIAGNOSIS — R002 Palpitations: Secondary | ICD-10-CM | POA: Insufficient documentation

## 2024-02-09 DIAGNOSIS — G4733 Obstructive sleep apnea (adult) (pediatric): Secondary | ICD-10-CM | POA: Insufficient documentation

## 2024-02-09 LAB — CBC WITH DIFFERENTIAL/PLATELET
Basophils Absolute: 0 K/uL (ref 0.0–0.1)
Basophils Relative: 0.6 % (ref 0.0–3.0)
Eosinophils Absolute: 0.1 K/uL (ref 0.0–0.7)
Eosinophils Relative: 1 % (ref 0.0–5.0)
HCT: 39.3 % (ref 36.0–46.0)
Hemoglobin: 13.2 g/dL (ref 12.0–15.0)
Lymphocytes Relative: 24.8 % (ref 12.0–46.0)
Lymphs Abs: 1.5 K/uL (ref 0.7–4.0)
MCHC: 33.7 g/dL (ref 30.0–36.0)
MCV: 92.7 fl (ref 78.0–100.0)
Monocytes Absolute: 0.6 K/uL (ref 0.1–1.0)
Monocytes Relative: 9.5 % (ref 3.0–12.0)
Neutro Abs: 4 K/uL (ref 1.4–7.7)
Neutrophils Relative %: 64.1 % (ref 43.0–77.0)
Platelets: 233 K/uL (ref 150.0–400.0)
RBC: 4.24 Mil/uL (ref 3.87–5.11)
RDW: 13.3 % (ref 11.5–15.5)
WBC: 6.2 K/uL (ref 4.0–10.5)

## 2024-02-09 LAB — HEPATIC FUNCTION PANEL
ALT: 16 U/L (ref 3–35)
AST: 20 U/L (ref 5–37)
Albumin: 4.3 g/dL (ref 3.5–5.2)
Alkaline Phosphatase: 64 U/L (ref 39–117)
Bilirubin, Direct: 0 mg/dL — ABNORMAL LOW (ref 0.1–0.3)
Total Bilirubin: 0.3 mg/dL (ref 0.2–1.2)
Total Protein: 7.1 g/dL (ref 6.0–8.3)

## 2024-02-09 LAB — MICROALBUMIN / CREATININE URINE RATIO
Creatinine,U: 57.9 mg/dL
Microalb Creat Ratio: UNDETERMINED mg/g (ref 0.0–30.0)
Microalb, Ur: <0.7 mg/dL

## 2024-02-09 LAB — BASIC METABOLIC PANEL WITH GFR
BUN: 33 mg/dL — ABNORMAL HIGH (ref 6–23)
CO2: 30 meq/L (ref 19–32)
Calcium: 9.8 mg/dL (ref 8.4–10.5)
Chloride: 103 meq/L (ref 96–112)
Creatinine, Ser: 1.03 mg/dL (ref 0.40–1.20)
GFR: 52.24 mL/min — ABNORMAL LOW
Glucose, Bld: 125 mg/dL — ABNORMAL HIGH (ref 70–99)
Potassium: 4.3 meq/L (ref 3.5–5.1)
Sodium: 139 meq/L (ref 135–145)

## 2024-02-09 LAB — HEMOGLOBIN A1C: Hgb A1c MFr Bld: 7.2 % — ABNORMAL HIGH (ref 4.6–6.5)

## 2024-02-09 LAB — LIPID PANEL
Cholesterol: 179 mg/dL (ref 28–200)
HDL: 79.5 mg/dL
LDL Cholesterol: 79 mg/dL (ref 10–99)
NonHDL: 99.79
Total CHOL/HDL Ratio: 2
Triglycerides: 103 mg/dL (ref 10.0–149.0)
VLDL: 20.6 mg/dL (ref 0.0–40.0)

## 2024-02-09 LAB — TSH: TSH: 2.16 u[IU]/mL (ref 0.35–5.50)

## 2024-02-09 MED ORDER — ALPRAZOLAM 0.5 MG PO TABS: ORAL_TABLET | ORAL | 3 refills | Status: AC

## 2024-02-09 NOTE — Patient Instructions (Addendum)
 Follow up in 6 months to recheck cholesterol We'll notify you of your lab results and make any changes if needed Continue to work on healthy diet and regular exercise- you're doing great! Ask if you can switch to Dr Kassie at [facility] for Pulmonary Call with any questions or concerns Stay Safe!  Stay Healthy! Happy New Year!

## 2024-02-09 NOTE — Patient Instructions (Signed)
 Medication Instructions:  Your physician recommends that you continue on your current medications as directed. Please refer to the Current Medication list given to you today.  *If you need a refill on your cardiac medications before your next appointment, please call your pharmacy*  Lab Work: LIPID TODAY If you have labs (blood work) drawn today and your tests are completely normal, you will receive your results only by: MyChart Message (if you have MyChart) OR A paper copy in the mail If you have any lab test that is abnormal or we need to change your treatment, we will call you to review the results.  Testing/Procedures: NONE  Follow-Up: At [facility], you and your health needs are our priority.  As part of our continuing mission to provide you with exceptional heart care, our providers are all part of one team.  This team includes your primary Cardiologist (physician) and Advanced Practice Providers or APPs (Physician Assistants and Nurse Practitioners) who all work together to provide you with the care you need, when you need it.  Your next appointment:   1 YEAR  Provider:   Dr..Schumann  We recommend signing up for the patient portal called MyChart.  Sign up information is provided on this After Visit Summary.  MyChart is used to connect with patients for Virtual Visits (Telemedicine).  Patients are able to view lab/test results, encounter notes, upcoming appointments, etc.  Non-urgent messages can be sent to your provider as well.   To learn more about what you can do with MyChart, go to [email or web address].   Other Instructions NONE

## 2024-02-09 NOTE — Progress Notes (Signed)
" ° °  Subjective:    Patient ID: [name] [name], female    DOB: 07/10/1945, 79 y.o.   MRN: [id number]  HPI DM- chronic problem, following w/ Dr Tommas.  Is considered 'a brittle' diabetic.  Due for foot exam, microalbumin.  UTD on eye exam.  Continues to have numbness and tingling of both feet.  No sores or blisters or wounds.  No CP, SOB, HA's, visual changes.    Hyperlipidemia- chronic problem, on Crestor  20mg  daily.  Denies abd pain, N/V.  Overweight- BMI 26.59.  Up 7 lbs since last visit.  Continues to exercise 3x/week.   Review of Systems For ROS see HPI     Objective:   Physical Exam Vitals reviewed.  Constitutional:      General: [name].    Appearance: Normal appearance. [name]. She is not ill-appearing.  HENT:     Head: Normocephalic and atraumatic.  Eyes:     Conjunctiva/sclera: Conjunctivae normal.     Pupils: Pupils are equal, round, and reactive to light.  Neck:     Thyroid : No thyromegaly.  Cardiovascular:     Rate and Rhythm: Normal rate and regular rhythm.     Pulses: Normal pulses.     Heart sounds: Normal heart sounds. No murmur heard. Pulmonary:     Effort: Pulmonary effort is normal. No respiratory distress.     Breath sounds: Normal breath sounds.  Abdominal:     General: There is no distension.     Palpations: Abdomen is soft.     Tenderness: There is no abdominal tenderness.  Musculoskeletal:     Cervical back: Normal range of motion and neck supple.     Right lower leg: No edema.     Left lower leg: No edema.  Lymphadenopathy:     Cervical: No cervical adenopathy.  Skin:    General: Skin is warm and dry.  Neurological:     General: No focal deficit present.     Mental Status: She is alert and oriented to person, place, and time.  Psychiatric:        Mood and Affect: Mood normal.        Behavior: Behavior normal.        Thought Content: Thought content normal.           Assessment & Plan:    "

## 2024-02-10 ENCOUNTER — Ambulatory Visit: Payer: Self-pay | Admitting: Family Medicine

## 2024-02-10 LAB — LIPID PANEL
Chol/HDL Ratio: 2.4 ratio (ref 0.0–4.4)
Cholesterol, Total: 191 mg/dL (ref 100–199)
HDL: 81 mg/dL
LDL Chol Calc (NIH): 95 mg/dL (ref 0–99)
Triglycerides: 86 mg/dL (ref 0–149)
VLDL Cholesterol Cal: 15 mg/dL (ref 5–40)

## 2024-02-12 NOTE — Assessment & Plan Note (Signed)
 Refill on Alprazolam  sent to pharmacy

## 2024-02-12 NOTE — Assessment & Plan Note (Signed)
 Pt is up 7 lbs since last visit.  She is exercising 3x/week.  Encouraged low carb diet both for weight and DM.

## 2024-02-12 NOTE — Assessment & Plan Note (Signed)
 Chronic problem.  Following w/ Dr Tommas.  Foot exam done today.  Microalbumin ordered.  UTD on eye exam.  Has known neuropathy but otherwise asymptomatic.  Will continue to follow.

## 2024-02-12 NOTE — Assessment & Plan Note (Signed)
 Chronic problem.  On Crestor 20mg  daily w/o difficulty.  Check labs.  Adjust meds prn

## 2024-02-22 ENCOUNTER — Other Ambulatory Visit: Payer: Self-pay | Admitting: Family Medicine

## 2024-02-22 DIAGNOSIS — Z1231 Encounter for screening mammogram for malignant neoplasm of breast: Secondary | ICD-10-CM

## 2024-03-15 ENCOUNTER — Ambulatory Visit: Payer: Medicare Other | Admitting: Family Medicine

## 2024-04-10 ENCOUNTER — Ambulatory Visit

## 2024-05-29 ENCOUNTER — Ambulatory Visit: Admitting: Family Medicine

## 2024-08-09 ENCOUNTER — Ambulatory Visit: Admitting: Family Medicine
